# Patient Record
Sex: Male | Born: 1950 | Race: White | Hispanic: No | Marital: Married | Smoking: Current every day smoker
Health system: Southern US, Community
[De-identification: ages and names within clinical notes are randomized; demographics above are authoritative.]

## PROBLEM LIST (undated history)

## (undated) DIAGNOSIS — I639 Cerebral infarction, unspecified: Secondary | ICD-10-CM

## (undated) DIAGNOSIS — I499 Cardiac arrhythmia, unspecified: Secondary | ICD-10-CM

## (undated) DIAGNOSIS — N189 Chronic kidney disease, unspecified: Secondary | ICD-10-CM

## (undated) DIAGNOSIS — M1990 Unspecified osteoarthritis, unspecified site: Secondary | ICD-10-CM

## (undated) DIAGNOSIS — J189 Pneumonia, unspecified organism: Secondary | ICD-10-CM

## (undated) DIAGNOSIS — I2510 Atherosclerotic heart disease of native coronary artery without angina pectoris: Secondary | ICD-10-CM

## (undated) DIAGNOSIS — D649 Anemia, unspecified: Secondary | ICD-10-CM

## (undated) DIAGNOSIS — Z5189 Encounter for other specified aftercare: Secondary | ICD-10-CM

## (undated) DIAGNOSIS — K2961 Other gastritis with bleeding: Secondary | ICD-10-CM

## (undated) DIAGNOSIS — K922 Gastrointestinal hemorrhage, unspecified: Secondary | ICD-10-CM

## (undated) DIAGNOSIS — K921 Melena: Secondary | ICD-10-CM

## (undated) DIAGNOSIS — I482 Chronic atrial fibrillation, unspecified: Secondary | ICD-10-CM

## (undated) DIAGNOSIS — K5780 Diverticulitis of intestine, part unspecified, with perforation and abscess without bleeding: Secondary | ICD-10-CM

## (undated) HISTORY — DX: Anemia, unspecified: D64.9

## (undated) HISTORY — DX: Melena: K92.1

## (undated) HISTORY — DX: Encounter for other specified aftercare: Z51.89

## (undated) HISTORY — PX: COLON SURGERY: SHX602

## (undated) HISTORY — PX: COLONOSCOPY: SHX174

## (undated) HISTORY — DX: Other gastritis with bleeding: K29.61

## (undated) HISTORY — DX: Gastrointestinal hemorrhage, unspecified: K92.2

## (undated) HISTORY — DX: Cerebral infarction, unspecified: I63.9

## (undated) HISTORY — PX: HERNIA REPAIR: SHX51

## (undated) HISTORY — PX: TONSILLECTOMY: SUR1361

---

## 2004-10-09 ENCOUNTER — Ambulatory Visit: Payer: Self-pay | Admitting: Cardiovascular Disease

## 2004-11-04 ENCOUNTER — Ambulatory Visit: Payer: Self-pay | Admitting: Internal Medicine

## 2004-11-17 ENCOUNTER — Ambulatory Visit: Payer: Self-pay | Admitting: Internal Medicine

## 2011-09-07 HISTORY — PX: COLECTOMY WITH COLOSTOMY CREATION/HARTMANN PROCEDURE: SHX6598

## 2012-06-06 HISTORY — PX: CORONARY ANGIOPLASTY WITH STENT PLACEMENT: SHX49

## 2012-09-02 ENCOUNTER — Inpatient Hospital Stay: Admit: 2012-09-02 | Payer: Self-pay | Admitting: Surgery

## 2012-09-02 ENCOUNTER — Inpatient Hospital Stay (HOSPITAL_COMMUNITY)
Admission: EM | Admit: 2012-09-02 | Discharge: 2012-09-19 | DRG: 329 | Disposition: A | Discharge status: Skilled Nursing Facility | Payer: Managed Care, Other (non HMO) | Attending: General Surgery | Admitting: General Surgery

## 2012-09-02 ENCOUNTER — Encounter (HOSPITAL_COMMUNITY): Admission: EM | Disposition: A | Discharge status: Skilled Nursing Facility | Payer: Self-pay | Source: Home / Self Care

## 2012-09-02 ENCOUNTER — Encounter (HOSPITAL_COMMUNITY): Payer: Self-pay | Admitting: *Deleted

## 2012-09-02 ENCOUNTER — Encounter (HOSPITAL_COMMUNITY): Payer: Self-pay | Admitting: Anesthesiology

## 2012-09-02 ENCOUNTER — Emergency Department (HOSPITAL_COMMUNITY): Payer: Managed Care, Other (non HMO) | Admitting: Anesthesiology

## 2012-09-02 ENCOUNTER — Other Ambulatory Visit (INDEPENDENT_AMBULATORY_CARE_PROVIDER_SITE_OTHER): Payer: Self-pay | Admitting: General Surgery

## 2012-09-02 DIAGNOSIS — J95821 Acute postprocedural respiratory failure: Secondary | ICD-10-CM | POA: Diagnosis present

## 2012-09-02 DIAGNOSIS — F10931 Alcohol use, unspecified with withdrawal delirium: Secondary | ICD-10-CM | POA: Diagnosis not present

## 2012-09-02 DIAGNOSIS — Y95 Nosocomial condition: Secondary | ICD-10-CM

## 2012-09-02 DIAGNOSIS — R5381 Other malaise: Secondary | ICD-10-CM | POA: Diagnosis not present

## 2012-09-02 DIAGNOSIS — K631 Perforation of intestine (nontraumatic): Secondary | ICD-10-CM | POA: Diagnosis present

## 2012-09-02 DIAGNOSIS — R339 Retention of urine, unspecified: Secondary | ICD-10-CM | POA: Diagnosis not present

## 2012-09-02 DIAGNOSIS — E876 Hypokalemia: Secondary | ICD-10-CM | POA: Diagnosis present

## 2012-09-02 DIAGNOSIS — J811 Chronic pulmonary edema: Secondary | ICD-10-CM | POA: Diagnosis not present

## 2012-09-02 DIAGNOSIS — K5732 Diverticulitis of large intestine without perforation or abscess without bleeding: Principal | ICD-10-CM | POA: Diagnosis present

## 2012-09-02 DIAGNOSIS — I4891 Unspecified atrial fibrillation: Secondary | ICD-10-CM

## 2012-09-02 DIAGNOSIS — D696 Thrombocytopenia, unspecified: Secondary | ICD-10-CM | POA: Diagnosis not present

## 2012-09-02 DIAGNOSIS — Z9861 Coronary angioplasty status: Secondary | ICD-10-CM

## 2012-09-02 DIAGNOSIS — R601 Generalized edema: Secondary | ICD-10-CM | POA: Diagnosis not present

## 2012-09-02 DIAGNOSIS — R509 Fever, unspecified: Secondary | ICD-10-CM | POA: Diagnosis not present

## 2012-09-02 DIAGNOSIS — Z7902 Long term (current) use of antithrombotics/antiplatelets: Secondary | ICD-10-CM

## 2012-09-02 DIAGNOSIS — I059 Rheumatic mitral valve disease, unspecified: Secondary | ICD-10-CM | POA: Diagnosis present

## 2012-09-02 DIAGNOSIS — R109 Unspecified abdominal pain: Secondary | ICD-10-CM

## 2012-09-02 DIAGNOSIS — R609 Edema, unspecified: Secondary | ICD-10-CM | POA: Diagnosis not present

## 2012-09-02 DIAGNOSIS — K55059 Acute (reversible) ischemia of intestine, part and extent unspecified: Secondary | ICD-10-CM

## 2012-09-02 DIAGNOSIS — F1020 Alcohol dependence, uncomplicated: Secondary | ICD-10-CM | POA: Diagnosis present

## 2012-09-02 DIAGNOSIS — F10231 Alcohol dependence with withdrawal delirium: Secondary | ICD-10-CM | POA: Diagnosis not present

## 2012-09-02 DIAGNOSIS — J449 Chronic obstructive pulmonary disease, unspecified: Secondary | ICD-10-CM | POA: Diagnosis present

## 2012-09-02 DIAGNOSIS — K551 Chronic vascular disorders of intestine: Secondary | ICD-10-CM

## 2012-09-02 DIAGNOSIS — N179 Acute kidney failure, unspecified: Secondary | ICD-10-CM | POA: Diagnosis not present

## 2012-09-02 DIAGNOSIS — J189 Pneumonia, unspecified organism: Secondary | ICD-10-CM

## 2012-09-02 DIAGNOSIS — F17200 Nicotine dependence, unspecified, uncomplicated: Secondary | ICD-10-CM | POA: Diagnosis present

## 2012-09-02 DIAGNOSIS — I4729 Other ventricular tachycardia: Secondary | ICD-10-CM | POA: Diagnosis not present

## 2012-09-02 DIAGNOSIS — J4489 Other specified chronic obstructive pulmonary disease: Secondary | ICD-10-CM | POA: Diagnosis present

## 2012-09-02 DIAGNOSIS — I498 Other specified cardiac arrhythmias: Secondary | ICD-10-CM | POA: Diagnosis not present

## 2012-09-02 DIAGNOSIS — I2510 Atherosclerotic heart disease of native coronary artery without angina pectoris: Secondary | ICD-10-CM | POA: Diagnosis present

## 2012-09-02 DIAGNOSIS — I472 Ventricular tachycardia: Secondary | ICD-10-CM

## 2012-09-02 DIAGNOSIS — Z7982 Long term (current) use of aspirin: Secondary | ICD-10-CM

## 2012-09-02 DIAGNOSIS — J9600 Acute respiratory failure, unspecified whether with hypoxia or hypercapnia: Secondary | ICD-10-CM

## 2012-09-02 DIAGNOSIS — J810 Acute pulmonary edema: Secondary | ICD-10-CM | POA: Diagnosis not present

## 2012-09-02 DIAGNOSIS — Z79899 Other long term (current) drug therapy: Secondary | ICD-10-CM

## 2012-09-02 DIAGNOSIS — I482 Chronic atrial fibrillation, unspecified: Secondary | ICD-10-CM | POA: Diagnosis present

## 2012-09-02 DIAGNOSIS — R188 Other ascites: Secondary | ICD-10-CM | POA: Diagnosis present

## 2012-09-02 HISTORY — DX: Atherosclerotic heart disease of native coronary artery without angina pectoris: I25.10

## 2012-09-02 HISTORY — PX: LAPAROTOMY: SHX154

## 2012-09-02 HISTORY — DX: Diverticulitis of large intestine without perforation or abscess without bleeding: K57.32

## 2012-09-02 HISTORY — DX: Diverticulitis of intestine, part unspecified, with perforation and abscess without bleeding: K57.80

## 2012-09-02 HISTORY — PX: COLOSTOMY REVISION: SHX5232

## 2012-09-02 LAB — POCT I-STAT 7, (LYTES, BLD GAS, ICA,H+H)
Acid-base deficit: 1 mmol/L (ref 0.0–2.0)
O2 Saturation: 100 %
Patient temperature: 34.9
TCO2: 26 mmol/L (ref 0–100)
pCO2 arterial: 42.4 mmHg (ref 35.0–45.0)
pO2, Arterial: 340 mmHg — ABNORMAL HIGH (ref 80.0–100.0)

## 2012-09-02 LAB — PREPARE RBC (CROSSMATCH)

## 2012-09-02 SURGERY — LAPAROTOMY, EXPLORATORY
Anesthesia: General | Site: Abdomen | Wound class: Clean Contaminated

## 2012-09-02 MED ORDER — FENTANYL CITRATE 0.05 MG/ML IJ SOLN
INTRAMUSCULAR | Status: DC | PRN
  Administered 2012-09-02 (×3): 50 ug via INTRAVENOUS

## 2012-09-02 MED ORDER — SUCCINYLCHOLINE CHLORIDE 20 MG/ML IJ SOLN
INTRAMUSCULAR | Status: DC | PRN
  Administered 2012-09-02: 120 mg via INTRAVENOUS

## 2012-09-02 MED ORDER — SODIUM CHLORIDE 0.9 % IR SOLN
Status: DC | PRN
  Administered 2012-09-02: 3000 mL

## 2012-09-02 MED ORDER — ROCURONIUM BROMIDE 100 MG/10ML IV SOLN
INTRAVENOUS | Status: DC | PRN
  Administered 2012-09-02: 20 mg via INTRAVENOUS
  Administered 2012-09-02: 30 mg via INTRAVENOUS

## 2012-09-02 MED ORDER — MIDAZOLAM HCL 5 MG/5ML IJ SOLN
INTRAMUSCULAR | Status: DC | PRN
  Administered 2012-09-02 (×2): 1 mg via INTRAVENOUS

## 2012-09-02 MED ORDER — GLYCOPYRROLATE 0.2 MG/ML IJ SOLN
INTRAMUSCULAR | Status: DC | PRN
  Administered 2012-09-02: 0.4 mg via INTRAVENOUS

## 2012-09-02 MED ORDER — PROPOFOL 10 MG/ML IV BOLUS
INTRAVENOUS | Status: DC | PRN
  Administered 2012-09-02: 180 mg via INTRAVENOUS

## 2012-09-02 MED ORDER — ARTIFICIAL TEARS OP OINT
TOPICAL_OINTMENT | OPHTHALMIC | Status: DC | PRN
  Administered 2012-09-02: 1 via OPHTHALMIC

## 2012-09-02 MED ORDER — NEOSTIGMINE METHYLSULFATE 1 MG/ML IJ SOLN
INTRAMUSCULAR | Status: DC | PRN
  Administered 2012-09-02: 3 mg via INTRAVENOUS

## 2012-09-02 MED ORDER — CEFAZOLIN SODIUM 1-5 GM-% IV SOLN: 1.0000 g | INTRAVENOUS | Status: DC

## 2012-09-02 MED ORDER — ALBUTEROL SULFATE HFA 108 (90 BASE) MCG/ACT IN AERS
INHALATION_SPRAY | RESPIRATORY_TRACT | Status: DC | PRN
  Administered 2012-09-02 (×2): 2 via RESPIRATORY_TRACT

## 2012-09-02 MED ORDER — PHENYLEPHRINE HCL 10 MG/ML IJ SOLN
INTRAMUSCULAR | Status: DC | PRN
  Administered 2012-09-02: 40 ug via INTRAVENOUS
  Administered 2012-09-02: 80 ug via INTRAVENOUS
  Administered 2012-09-02: 40 ug via INTRAVENOUS
  Administered 2012-09-02 (×2): 80 ug via INTRAVENOUS

## 2012-09-02 MED ORDER — LACTATED RINGERS IV SOLN
INTRAVENOUS | Status: DC | PRN
  Administered 2012-09-02: 22:00:00 via INTRAVENOUS

## 2012-09-02 MED ORDER — SODIUM BICARBONATE 8.4 % IV SOLN
INTRAVENOUS | Status: DC | PRN
  Administered 2012-09-02: 50 meq via INTRAVENOUS

## 2012-09-02 MED ORDER — CEFAZOLIN SODIUM-DEXTROSE 2-3 GM-% IV SOLR
2.0000 g | INTRAVENOUS | Status: AC
  Administered 2012-09-02: 1 g via INTRAVENOUS

## 2012-09-02 MED ORDER — LIDOCAINE HCL (CARDIAC) 20 MG/ML IV SOLN
INTRAVENOUS | Status: DC | PRN
  Administered 2012-09-02: 100 mg via INTRAVENOUS

## 2012-09-02 MED ORDER — SODIUM CHLORIDE 0.9 % IR SOLN
Status: DC | PRN
  Administered 2012-09-02: 20:00:00

## 2012-09-02 SURGICAL SUPPLY — 69 items
ADH SKN CLS APL DERMABOND .7 (GAUZE/BANDAGES/DRESSINGS) ×4
ATTRACTOMAT 16X20 MAGNETIC DRP (DRAPES) ×2 IMPLANT
BRR ADH 5X3 SEPRAFILM 6 SHT (MISCELLANEOUS) ×2
CANISTER SUCTION 2500CC (MISCELLANEOUS) ×3 IMPLANT
CLIP TI LARGE 6 (CLIP) ×2 IMPLANT
CLIP TI MEDIUM 24 (CLIP) ×3 IMPLANT
CLIP TI MEDIUM 6 (CLIP) ×2 IMPLANT
CLIP TI WIDE RED SMALL 24 (CLIP) ×3 IMPLANT
CLIP TI WIDE RED SMALL 6 (CLIP) ×2 IMPLANT
CLOTH BEACON ORANGE TIMEOUT ST (SAFETY) ×3 IMPLANT
COVER MAYO STAND STRL (DRAPES) ×3 IMPLANT
COVER SURGICAL LIGHT HANDLE (MISCELLANEOUS) ×3 IMPLANT
DERMABOND ADVANCED (GAUZE/BANDAGES/DRESSINGS) ×2
DERMABOND ADVANCED .7 DNX12 (GAUZE/BANDAGES/DRESSINGS) ×4 IMPLANT
DRAPE WARM FLUID 44X44 (DRAPE) ×3 IMPLANT
DRSG COVADERM 4X8 (GAUZE/BANDAGES/DRESSINGS) ×2 IMPLANT
ELECT BLADE 4.0 EZ CLEAN MEGAD (MISCELLANEOUS)
ELECT BLADE 6.5 EXT (BLADE) IMPLANT
ELECT REM PT RETURN 9FT ADLT (ELECTROSURGICAL) ×3
ELECTRODE BLDE 4.0 EZ CLN MEGD (MISCELLANEOUS) ×1 IMPLANT
ELECTRODE REM PT RTRN 9FT ADLT (ELECTROSURGICAL) ×2 IMPLANT
GEL ULTRASOUND 20GR AQUASONIC (MISCELLANEOUS) IMPLANT
GLOVE BIOGEL PI IND STRL 6 (GLOVE) ×5 IMPLANT
GLOVE BIOGEL PI IND STRL 7.5 (GLOVE) ×2 IMPLANT
GLOVE BIOGEL PI INDICATOR 6 (GLOVE) ×5
GLOVE BIOGEL PI INDICATOR 7.5 (GLOVE) ×1
GLOVE SURG SS PI 7.5 STRL IVOR (GLOVE) ×3 IMPLANT
GOWN PREVENTION PLUS XXLARGE (GOWN DISPOSABLE) ×3 IMPLANT
GOWN STRL NON-REIN LRG LVL3 (GOWN DISPOSABLE) ×12 IMPLANT
HEMOSTAT SNOW SURGICEL 2X4 (HEMOSTASIS) IMPLANT
HEMOSTAT SURGICEL 2X14 (HEMOSTASIS) IMPLANT
INSERT FOGARTY 61MM (MISCELLANEOUS) ×3 IMPLANT
INSERT FOGARTY SM (MISCELLANEOUS) ×2 IMPLANT
KIT BASIN OR (CUSTOM PROCEDURE TRAY) ×3 IMPLANT
KIT COLOSTOMY ILEOSTOMY 2.75 (WOUND CARE) ×2 IMPLANT
KIT ROOM TURNOVER OR (KITS) ×3 IMPLANT
LIGASURE IMPACT 36 18CM CVD LR (INSTRUMENTS) ×2 IMPLANT
NS IRRIG 1000ML POUR BTL (IV SOLUTION) ×10 IMPLANT
PACK AORTA (CUSTOM PROCEDURE TRAY) ×3 IMPLANT
PAD ARMBOARD 7.5X6 YLW CONV (MISCELLANEOUS) ×6 IMPLANT
RELOAD PROXIMATE 75MM BLUE (ENDOMECHANICALS) ×6 IMPLANT
RELOAD STAPLE 75 3.8 BLU REG (ENDOMECHANICALS) IMPLANT
SEPRAFILM PROCEDURAL PACK 3X5 (MISCELLANEOUS) ×2 IMPLANT
SPECIMEN JAR MEDIUM (MISCELLANEOUS) ×3 IMPLANT
SPONGE GAUZE 4X4 12PLY (GAUZE/BANDAGES/DRESSINGS) ×2 IMPLANT
STAPLER PROXIMATE 75MM BLUE (STAPLE) ×2 IMPLANT
STAPLER VISISTAT 35W (STAPLE) ×2 IMPLANT
SUT CHROMIC 2 0 SH (SUTURE) ×4 IMPLANT
SUT ETHIBOND 5 LR DA (SUTURE) IMPLANT
SUT PDS AB 1 TP1 54 (SUTURE) ×2 IMPLANT
SUT PDS AB 1 TP1 96 (SUTURE) ×4 IMPLANT
SUT PROLENE 2 0 SH 30 (SUTURE) ×4 IMPLANT
SUT PROLENE 3 0 SH 48 (SUTURE) ×3 IMPLANT
SUT PROLENE 5 0 C 1 24 (SUTURE) IMPLANT
SUT PROLENE 5 0 C 1 36 (SUTURE) IMPLANT
SUT SILK 2 0SH CR/8 30 (SUTURE) ×1 IMPLANT
SUT VIC AB 2-0 CT1 18 (SUTURE) ×2 IMPLANT
SUT VIC AB 2-0 CT1 27 (SUTURE) ×3
SUT VIC AB 2-0 CT1 36 (SUTURE) ×2 IMPLANT
SUT VIC AB 2-0 CT1 TAPERPNT 27 (SUTURE) ×1 IMPLANT
SUT VIC AB 2-0 SH 18 (SUTURE) ×2 IMPLANT
SUT VIC AB 3-0 SH 27 (SUTURE) ×12
SUT VIC AB 3-0 SH 27X BRD (SUTURE) ×8 IMPLANT
SUT VICRYL 4-0 PS2 18IN ABS (SUTURE) ×2 IMPLANT
TOWEL BLUE STERILE X RAY DET (MISCELLANEOUS) ×6 IMPLANT
TOWEL OR 17X24 6PK STRL BLUE (TOWEL DISPOSABLE) ×6 IMPLANT
TOWEL OR 17X26 10 PK STRL BLUE (TOWEL DISPOSABLE) ×8 IMPLANT
TRAY FOLEY CATH 14FRSI W/METER (CATHETERS) ×3 IMPLANT
WATER STERILE IRR 1000ML POUR (IV SOLUTION) ×6 IMPLANT

## 2012-09-02 NOTE — H&P (Addendum)
[facility] [place]      History and Physical  Patient name: Johnathan Bauer MRN: [id number] DOB: 07-May-1951 Sex: male   Reason for Admission:  Chief Complaint  Patient presents with  . Mesenteric Insufficiency    HISTORY OF PRESENT ILLNESS: This is a very pleasant 61 year old gentleman who was trasnferred from [facility] for abdominal pain.  He states that the pain began yesterday morning.  It was unbearable.  He had emesis x3  as well as non-bloody diarrhea.  There were no relieving or aggravating factors.  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  His lactate was 7.8.  He was given a bolus of heparin and transferred to [facility].  His pain has improved with narcotics.  He had rated it 10/10.  He has a history of AFIB.  He could not tolerate coumadin.  He had a coronary stent placed in March for SOB.  He is on Effient and ASA for his stent.  He is a current smoker, although he has cut back since his stent.   PMH: AFIB CAD, s/p stent Tob abuse  PSH:  None  Social History:  Current smoker  FAMILY HISTORY:  Father died of melanoma and lymphoma.  Grandfather died of prostate cancer at age 24  Allergies:  NKDA  MEDS: Effient 5mg  daily ASA 81mg  qday Digoxin 0.5 mg PO daily Lopresor 50 mg PO BID   REVIEW OF SYSTEMS: Cardiovascular: No chest pain, chest pressure, palpitations, orthopnea, or dyspnea on exertion. No claudication or rest pain,  No history of DVT or phlebitis. Pulmonary: No productive cough, asthma or wheezing. Neurologic: No weakness, paresthesias, aphasia, or amaurosis. No dizziness. Hematologic: No bleeding problems or clotting disorders.  On Effient for cardiac stent Musculoskeletal: No joint pain or joint swelling. Gastrointestinal: No blood in stool.  Did have diarrhea and emesis x3 yesterday.  Significant abd pain  Genitourinary: No dysuria or hematuria. Psychiatric:: No history of major  depression. Integumentary: No rashes or ulcers. Constitutional: No fever or chills.  PHYSICAL EXAMINATION: General: The patient appears their stated age.  Vital signs are BP 119/86  Pulse 101  Temp 97.8 F (36.6 C) (Oral)  Resp 18  SpO2 98% HEENT:  No gross abnormalities Pulmonary: Respirations are non-labored Abdomen: Tender in lower abd.  No rebound Musculoskeletal: There are no major deformities.   Neurologic: No focal weakness or paresthesias are detected, Skin: There are no ulcer or rashes noted. Psychiatric: The patient has normal affect. Cardiovascular: There is a irregular rhythm.  Pedal pulses are not palpable.  Brisk femoral pulses.  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  The ascites in his abdomen is concerning for ischemic bowel.  Despite being on effient, the patient needs urgent abdominal exploration.  I discussed the significance of the CT scan findings as well as the details of the operation with the patient and wife.  They understand the gravity of the situation.  I told them of the many possibilities that may occur during the operation and that we could not be sure exactly what will occur until we exactly define the problem.  They understand that this is a life threatening  problem.  I am having platelets set up to try and offset some of the effects of the Effient.       Jorge Ny, M.D. [facility] [place] Office: [phone] Pager:  [phone]

## 2012-09-02 NOTE — Anesthesia Preprocedure Evaluation (Addendum)
Anesthesia Evaluation  Patient identified by MRN, date of birth, ID band Patient awake    Reviewed: Allergy & Precautions, H&P , NPO status , Patient's Chart, lab work & pertinent test results  Airway       Dental   Pulmonary former smoker,          Cardiovascular hypertension, + dysrhythmias Atrial Fibrillation     Neuro/Psych    GI/Hepatic Suspected ischemic bowel   Endo/Other    Renal/GU      Musculoskeletal   Abdominal   Peds  Hematology   Anesthesia Other Findings   Reproductive/Obstetrics                           Anesthesia Physical Anesthesia Plan  ASA: IV and emergent  Anesthesia Plan: General   Post-op Pain Management:    Induction: Intravenous, Rapid sequence and Cricoid pressure planned  Airway Management Planned: Oral ETT  Additional Equipment: Arterial line  Intra-op Plan:   Post-operative Plan: Post-operative intubation/ventilation  Informed Consent: I have reviewed the patients History and Physical, chart, labs and discussed the procedure including the risks, benefits and alternatives for the proposed anesthesia with the patient or authorized representative who has indicated his/her understanding and acceptance.     Plan Discussed with: Surgeon and CRNA  Anesthesia Plan Comments:         Anesthesia Quick Evaluation

## 2012-09-02 NOTE — ED Notes (Signed)
Dr Brabham at bedside. 

## 2012-09-02 NOTE — Consult Note (Signed)
Reason for Consult:SMA embolus Referring Physician: Dr. Blanche East Johnathan Bauer is an 61 y.o. male.  HPI: Pt with a ~ 18hr h/o sever abd pain.  Pt was seen and eval'd at an OSH and dx'd with SMA embolus on CT.  Pt was transferred for further care.  Pt con't with pain in the RLQ.  He does have a h/o Afib and on anti-plt tx.  No past medical history on file.  No past surgical history on file.  No family history on file.  Social History:  does not have a smoking history on file. He does not have any smokeless tobacco history on file. His alcohol and drug histories not on file.  Allergies: Allergies not on file  Medications: I have reviewed the patient's current medications.  No results found for this or any previous visit (from the past 48 hour(s)).  No results found.  Review of Systems  Constitutional: Negative.   Respiratory: Negative.   Cardiovascular: Negative.   Gastrointestinal: Positive for abdominal pain.   Blood pressure 119/86, pulse 101, temperature 97.8 F (36.6 C), temperature source Oral, resp. rate 18, SpO2 98.00%. Physical Exam  Constitutional: He appears well-developed and well-nourished.  HENT:  Head: Normocephalic and atraumatic.  Eyes: Conjunctivae normal and EOM are normal. Pupils are equal, round, and reactive to light.  Neck: Normal range of motion.  Respiratory: Effort normal and breath sounds normal.  GI: Soft. He exhibits no distension. There is tenderness (rlq). There is no rebound and no guarding.    Assessment/Plan: 61 y/o M with SMA embolus  I d/w the pt and his wife, the need for ex lap and eval of uncomprimised bowel.  I discussed the possibility to have to resect and large amount of bowel, and also the possibility if too much bowel compromised that we would not be able to attempt resection.  We also discussed the likely need to have to leave his abd open for a "2nd look operation" in 24-48 hrs.  They both understood and are willing to proceed  with any necessary tx.    Johnathan Bauer., Johnathan Bauer 09/02/2012, 8:56 PM

## 2012-09-02 NOTE — Anesthesia Procedure Notes (Signed)
Procedure Name: Intubation Date/Time: 09/02/2012 9:54 PM Performed by: Gayla Medicus Pre-anesthesia Checklist: Patient identified, Timeout performed, Emergency Drugs available, Suction available and Patient being monitored Patient Re-evaluated:Patient Re-evaluated prior to inductionOxygen Delivery Method: Circle system utilized Preoxygenation: Pre-oxygenation with 100% oxygen Intubation Type: IV induction, Cricoid Pressure applied and Rapid sequence Laryngoscope Size: Mac and 3 Grade View: Grade II Tube type: Oral Tube size: 7.5 mm Number of attempts: 1 Airway Equipment and Method: Stylet Placement Confirmation: ETT inserted through vocal cords under direct vision,  positive ETCO2 and breath sounds checked- equal and bilateral Secured at: 23 cm Tube secured with: Tape Dental Injury: Teeth and Oropharynx as per pre-operative assessment

## 2012-09-02 NOTE — ED Notes (Signed)
Pt Administered Zofran, and Dilaudid prior to leaving [facility]

## 2012-09-02 NOTE — Preoperative (Signed)
Beta Blockers   Reason not to administer Beta Blockers:Not Applicable 

## 2012-09-02 NOTE — ED Notes (Signed)
Pt arrived from [facility] via [facility] c/o Superior Mesenteric Artery Thrombosis, for surgery. Accepting physician Myra Gianotti MD

## 2012-09-03 ENCOUNTER — Encounter (HOSPITAL_COMMUNITY): Payer: Self-pay | Admitting: Cardiology

## 2012-09-03 DIAGNOSIS — K5732 Diverticulitis of large intestine without perforation or abscess without bleeding: Secondary | ICD-10-CM

## 2012-09-03 DIAGNOSIS — I2510 Atherosclerotic heart disease of native coronary artery without angina pectoris: Secondary | ICD-10-CM | POA: Diagnosis present

## 2012-09-03 DIAGNOSIS — K631 Perforation of intestine (nontraumatic): Secondary | ICD-10-CM

## 2012-09-03 DIAGNOSIS — I482 Chronic atrial fibrillation, unspecified: Secondary | ICD-10-CM | POA: Diagnosis present

## 2012-09-03 HISTORY — DX: Perforation of intestine (nontraumatic): K63.1

## 2012-09-03 HISTORY — DX: Atherosclerotic heart disease of native coronary artery without angina pectoris: I25.10

## 2012-09-03 LAB — BASIC METABOLIC PANEL
CO2: 22 mEq/L (ref 19–32)
Calcium: 6.9 mg/dL — ABNORMAL LOW (ref 8.4–10.5)
GFR calc Af Amer: >90 mL/min (ref >90)
Sodium: 134 mEq/L — ABNORMAL LOW (ref 135–145)

## 2012-09-03 LAB — CBC
MCV: 104.1 fL — ABNORMAL HIGH (ref 78.0–100.0)
Platelets: 141 10*3/uL — ABNORMAL LOW (ref 150–400)
RBC: 3.68 MIL/uL — ABNORMAL LOW (ref 4.22–5.81)
RDW: 13.2 % (ref 11.5–15.5)
WBC: 15.2 10*3/uL — ABNORMAL HIGH (ref 4.0–10.5)

## 2012-09-03 LAB — PREPARE PLATELET PHERESIS: Unit division: 0

## 2012-09-03 LAB — APTT: aPTT: 36 seconds (ref 24–37)

## 2012-09-03 LAB — PHOSPHORUS: Phosphorus: 3.1 mg/dL (ref 2.3–4.6)

## 2012-09-03 LAB — PROTIME-INR
INR: 1.64 — ABNORMAL HIGH (ref 0.00–1.49)
Prothrombin Time: 18.9 seconds — ABNORMAL HIGH (ref 11.6–15.2)

## 2012-09-03 LAB — LACTIC ACID, PLASMA: Lactic Acid, Venous: 3.6 mmol/L — ABNORMAL HIGH (ref 0.5–2.2)

## 2012-09-03 LAB — MAGNESIUM: Magnesium: 0.6 mg/dL — CL (ref 1.5–2.5)

## 2012-09-03 MED ORDER — CIPROFLOXACIN IN D5W 400 MG/200ML IV SOLN
400.0000 mg | Freq: Two times a day (BID) | INTRAVENOUS | Status: DC
  Administered 2012-09-03 – 2012-09-06 (×7): 400 mg via INTRAVENOUS
  Filled 2012-09-03 (×11): qty 200

## 2012-09-03 MED ORDER — METRONIDAZOLE IN NACL 5-0.79 MG/ML-% IV SOLN
500.0000 mg | Freq: Three times a day (TID) | INTRAVENOUS | Status: DC
  Administered 2012-09-03 – 2012-09-06 (×11): 500 mg via INTRAVENOUS
  Filled 2012-09-03 (×13): qty 100

## 2012-09-03 MED ORDER — HYDROMORPHONE HCL PF 1 MG/ML IJ SOLN
INTRAMUSCULAR | Status: AC
  Filled 2012-09-03: qty 1

## 2012-09-03 MED ORDER — ONDANSETRON HCL 4 MG/2ML IJ SOLN: 4.0000 mg | Freq: Four times a day (QID) | INTRAMUSCULAR | Status: DC | PRN

## 2012-09-03 MED ORDER — DILTIAZEM HCL 25 MG/5ML IV SOLN
10.0000 mg | Freq: Once | INTRAVENOUS | Status: AC
  Administered 2012-09-03: 10 mg via INTRAVENOUS
  Filled 2012-09-03: qty 5

## 2012-09-03 MED ORDER — DIPHENHYDRAMINE HCL 12.5 MG/5ML PO ELIX
12.5000 mg | ORAL_SOLUTION | Freq: Four times a day (QID) | ORAL | Status: DC | PRN
  Filled 2012-09-03: qty 5

## 2012-09-03 MED ORDER — DIPHENHYDRAMINE HCL 50 MG/ML IJ SOLN: 12.5000 mg | Freq: Four times a day (QID) | INTRAMUSCULAR | Status: DC | PRN

## 2012-09-03 MED ORDER — HYDROMORPHONE 0.3 MG/ML IV SOLN
INTRAVENOUS | Status: DC
  Administered 2012-09-03 (×2): via INTRAVENOUS
  Administered 2012-09-03: 1.8 mg via INTRAVENOUS
  Administered 2012-09-03: 4.08 mg via INTRAVENOUS
  Administered 2012-09-03 (×2): 1.8 mg via INTRAVENOUS
  Administered 2012-09-04 (×2): 1.2 mg via INTRAVENOUS
  Administered 2012-09-04 (×2): 0.9 mg via INTRAVENOUS
  Administered 2012-09-04: 1.2 mg via INTRAVENOUS
  Administered 2012-09-04: 1.14 mg via INTRAVENOUS
  Administered 2012-09-04: 09:00:00 via INTRAVENOUS
  Administered 2012-09-05: 0.9 mg via INTRAVENOUS
  Administered 2012-09-05 (×2): 1.5 mg via INTRAVENOUS
  Filled 2012-09-03 (×3): qty 25

## 2012-09-03 MED ORDER — OXYCODONE HCL 5 MG/5ML PO SOLN: 5.0000 mg | Freq: Once | ORAL | Status: DC | PRN

## 2012-09-03 MED ORDER — HYDROMORPHONE 0.3 MG/ML IV SOLN
INTRAVENOUS | Status: AC
  Administered 2012-09-03: 0.6 mg
  Filled 2012-09-03: qty 25

## 2012-09-03 MED ORDER — METRONIDAZOLE IN NACL 5-0.79 MG/ML-% IV SOLN: 500.0000 mg | Freq: Three times a day (TID) | INTRAVENOUS | Status: DC

## 2012-09-03 MED ORDER — MAGNESIUM SULFATE 40 MG/ML IJ SOLN
4.0000 g | Freq: Once | INTRAMUSCULAR | Status: AC
  Administered 2012-09-03: 4 g via INTRAVENOUS
  Filled 2012-09-03: qty 100

## 2012-09-03 MED ORDER — PANTOPRAZOLE SODIUM 40 MG IV SOLR
40.0000 mg | Freq: Every day | INTRAVENOUS | Status: DC
  Administered 2012-09-03 – 2012-09-09 (×7): 40 mg via INTRAVENOUS
  Filled 2012-09-03 (×11): qty 40

## 2012-09-03 MED ORDER — HYDROMORPHONE HCL PF 1 MG/ML IJ SOLN
0.2500 mg | INTRAMUSCULAR | Status: DC | PRN
  Administered 2012-09-03 (×2): 0.5 mg via INTRAVENOUS

## 2012-09-03 MED ORDER — SODIUM CHLORIDE 0.9 % IV SOLN: 1.0000 g | Freq: Once | INTRAVENOUS | Status: DC

## 2012-09-03 MED ORDER — OXYCODONE HCL 5 MG PO TABS: 5.0000 mg | ORAL_TABLET | Freq: Once | ORAL | Status: DC | PRN

## 2012-09-03 MED ORDER — CIPROFLOXACIN IN D5W 400 MG/200ML IV SOLN: 400.0000 mg | Freq: Two times a day (BID) | INTRAVENOUS | Status: DC

## 2012-09-03 MED ORDER — NALOXONE HCL 0.4 MG/ML IJ SOLN: 0.4000 mg | INTRAMUSCULAR | Status: DC | PRN

## 2012-09-03 MED ORDER — SODIUM CHLORIDE 0.9 % IJ SOLN: 9.0000 mL | INTRAMUSCULAR | Status: DC | PRN

## 2012-09-03 MED ORDER — DILTIAZEM HCL 100 MG IV SOLR
5.0000 mg/h | INTRAVENOUS | Status: DC
  Administered 2012-09-03: 8 mg/h via INTRAVENOUS
  Administered 2012-09-03: 5 mg/h via INTRAVENOUS
  Administered 2012-09-04: 8 mg/h via INTRAVENOUS
  Administered 2012-09-05: 15 mg/h via INTRAVENOUS
  Filled 2012-09-03 (×4): qty 100

## 2012-09-03 MED ORDER — ONDANSETRON HCL 4 MG/2ML IJ SOLN
4.0000 mg | Freq: Four times a day (QID) | INTRAMUSCULAR | Status: DC | PRN
  Administered 2012-09-09 – 2012-09-10 (×2): 4 mg via INTRAVENOUS
  Filled 2012-09-03 (×2): qty 2

## 2012-09-03 MED ORDER — PROMETHAZINE HCL 25 MG/ML IJ SOLN: 6.2500 mg | INTRAMUSCULAR | Status: DC | PRN

## 2012-09-03 MED ORDER — DEXTROSE IN LACTATED RINGERS 5 % IV SOLN
INTRAVENOUS | Status: DC
  Administered 2012-09-03 – 2012-09-05 (×4): via INTRAVENOUS

## 2012-09-03 MED ORDER — DILTIAZEM LOAD VIA INFUSION
10.0000 mg | Freq: Once | INTRAVENOUS | Status: DC
  Filled 2012-09-03: qty 10

## 2012-09-03 NOTE — Progress Notes (Signed)
CRITICAL VALUE ALERT  Critical value received:  Magnesium 0.6  Date of notification:  12/29  Time of notification:  0830  Critical value read back: yes   Nurse who received alert: Demetria Pore  MD notified (1st page):  Dr. Rayburn Ma  Time of first page: [phone]  Orders received to have pharmacy give replacement dosage of magnesium.

## 2012-09-03 NOTE — Op Note (Signed)
[facility] of [place]  Patient name: Johnathan Bauer MRN: [id number] DOB: [birth year] Sex: male  09/02/2012 - 09/03/2012 Pre-operative Diagnosis: SMA embolus with compromised bowel Post-operative diagnosis:  Perforated diverticulitis Surgeon:  Jorge Ny Assistants:  A. Derrell Lolling Procedure:   1.) Exploratory Laparotomy  2.) resection of sigmoid colon  3.)  End colostomy Anesthesia:  General Blood Loss:  See anesthesia record Specimens:  Sigmoid colon  Findings:  Palpable pulse within proximal SMA.  Doppler signals in the entire small bowel with no evidence of mesenteric ischemia.  Perforated diverticulitis  Indications:  The patient presented in transfer from [facility] with 24 hours of abdominal pain.  The patient has a history of AFIB, but is not on coumadin.  He is on antiplatelet therapy for a cardiac stent.  He has a CT at [facility] that showed SMA mural thrombus.  There was concern for occlusion.  He also had extensive diverticulosis / diverticulitis.  He was febrile on arrival with abdominal pain.  Outside lactate was 7.  I felt this was potentially a life threatening situation as there was significant concern for mesenteric ischemia with dead bowel.  Therefore, he was consented for exploration  Procedure:  The patient was identified in the holding area and taken to [facility] OR ROOM 10  The patient was then placed supine on the table. general anesthesia was administered.  The patient was prepped and draped in the usual sterile fashion.  A time out was called and antibiotics were administered.  A midline abdominal incision was made.  This was carried down to the fascia with cautery.  The fascia was divided and the peritoneal cavity was opened sharply.  There was evidence of a perforated viscus.  We ran the entire length of the small bowel.  The were no areas of ischemia from the ligament of Treitz to the sigmoid colon.  There was extensive diverticular disease within the  sigmoid colon with an area pf perforation.  I was able to palpate the SMA proximally, and it had a good pulse.  Also, doppler was used to examine the small bowel.  There was a good doppler signal within the small bowel out to the distal mesentary.  At this point, I saw no evidence of mesenteric ischemia, and given the perforated diverticulum, I felt this was the source of his pain and exam findings.  I did not feel that under these conditions that further evaluation or intervention on his SMA was warented.  Dr. Antonieta Pert then took over and performed the rest of the procedure.  Please see his note for further details.  I assisted him for the duration of the proceedure   Disposition:  To PACU in stable condition.   Juleen China, M.D. [facility] [place] Office: [phone] Pager:  [phone]

## 2012-09-03 NOTE — Progress Notes (Signed)
1 Day Post-Op  Subjective: Pt doing well.  Cards eval and help appreciated.  abd soreness this AM.  Objective: Vital signs in last 24 hours: Temp:  [97.8 F (36.6 C)-98.3 F (36.8 C)] 97.8 F (36.6 C) (12/29 0213) Pulse Rate:  [57-136] 99  (12/29 0630) Resp:  [12-25] 18  (12/29 0630) BP: (104-154)/(72-109) 123/74 mmHg (12/29 0215) SpO2:  [91 %-100 %] 94 % (12/29 0630) Arterial Line BP: (97-154)/(56-89) 97/56 mmHg (12/29 0630) Weight:  [180 lb 12.4 oz (82 kg)] 180 lb 12.4 oz (82 kg) (12/29 0630)    Intake/Output from previous day: 12/28 0701 - 12/29 0700 In: 2602 [I.V.:2000; Blood:302; IV Piggyback:300] Out: 285 [Urine:285] Intake/Output this shift: Total I/O In: 2602 [I.V.:2000; Blood:302; IV Piggyback:300] Out: 285 [Urine:285]  General appearance: alert and cooperative Cardio: regular rate and rhythm, S1, S2 normal, no murmur, click, rub or gallop GI: approp tpp, ND, decreased BS, ostomy pink/patent  Lab Results:   Basename 09/02/12 2246  WBC --  HGB 15.3  HCT 45.0  PLT --   BMET  Basename 09/02/12 2246  NA 136  K 5.5*  CL --  CO2 --  GLUCOSE --  BUN --  CREATININE --  CALCIUM --   PT/INR No results found for this basename: LABPROT:2,INR:2 in the last 72 hours ABG  Basename 09/02/12 2246  PHART 7.368  HCO3 24.9*    Studies/Results: No results found.  Anti-infectives: Anti-infectives     Start     Dose/Rate Route Frequency Ordered Stop   09/04/12 2300   ertapenem (INVANZ) 1 g in sodium chloride 0.9 % 50 mL IVPB  Status:  Discontinued        1 g 100 mL/hr over 30 Minutes Intravenous  Once 09/03/12 0200 09/03/12 0240   09/03/12 0600   ceFAZolin (ANCEF) IVPB 1 g/50 mL premix  Status:  Discontinued     Comments: Send with pt to OR      1 g 100 mL/hr over 30 Minutes Intravenous On call 09/02/12 2140 09/02/12 2148   09/03/12 0600   ceFAZolin (ANCEF) IVPB 2 g/50 mL premix     Comments: Send with pt to OR      2 g 100 mL/hr over 30 Minutes  Intravenous On call 09/02/12 2148 09/02/12 2212   09/03/12 0400   ciprofloxacin (CIPRO) IVPB 400 mg        400 mg 200 mL/hr over 60 Minutes Intravenous 2 times daily 09/03/12 0241     09/03/12 0400   metroNIDAZOLE (FLAGYL) IVPB 500 mg        500 mg 100 mL/hr over 60 Minutes Intravenous 3 times per day 09/03/12 0241     09/03/12 0045   ciprofloxacin (CIPRO) IVPB 400 mg  Status:  Discontinued        400 mg 200 mL/hr over 60 Minutes Intravenous Every 12 hours 09/03/12 0035 09/03/12 0235   09/03/12 0045   metroNIDAZOLE (FLAGYL) IVPB 500 mg  Status:  Discontinued        500 mg 100 mL/hr over 60 Minutes Intravenous Every 8 hours 09/03/12 0035 09/03/12 0235          Assessment/Plan: s/p Procedure(s) (LRB) with comments: EXPLORATORY LAPAROTOMY (N/A) - Exploratory Laparotomy with Sigmoid resection with end colocstomy. COLON RESECTION SIGMOID (N/A) - Sigmoid resection with End Colostomy.  OOBTC today con't abx secondary to intraabd infection con't foley to monitor UOP I would hold his Effient for a few days to allow things to clot surgically.  Things were really oozy in the OR secondary to its use.     LOS: 1 day    Marigene Ehlers., Jed Limerick 09/03/2012

## 2012-09-03 NOTE — Anesthesia Postprocedure Evaluation (Signed)
  Anesthesia Post-op Note  Patient: Johnathan Bauer  Procedure(s) Performed: Procedure(s) (LRB) with comments: EXPLORATORY LAPAROTOMY (N/A) - Exploratory Laparotomy with Sigmoid resection with end colocstomy. COLON RESECTION SIGMOID (N/A) - Sigmoid resection with End Colostomy.  Patient Location: PACU  Anesthesia Type:General  Level of Consciousness: awake  Airway and Oxygen Therapy: Patient Spontanous Breathing  Post-op Pain: mild  Post-op Assessment: Post-op Vital signs reviewed, Patient's Cardiovascular Status Stable, Respiratory Function Stable, Patent Airway, No signs of Nausea or vomiting and Pain level controlled  Post-op Vital Signs: stable  Complications: No apparent anesthesia complications

## 2012-09-03 NOTE — Op Note (Signed)
Pre Operative Diagnosis:  SMA Embolus with compromised bowel  Post Operative Diagnosis: perforated sigmoid diverticulitis  Procedure: exploratory laparotomy, sigmoid colon resection, end colostomy  Surgeon: Dr. Axel Filler  Assistant: Dr. Myra Gianotti  Anesthesia: GETA  EBL: 20 cc  Complications: none  Counts: reported as correct x 2  Findings:  The patient had normal palpable pulses in his SMA per Dr. Estanislado Spire exam.  Patient had a small micro-perforation of the sigmoid colon was found to have free purulent fluid within the abdominal cavity.  The patient did dopplerable signals within the distal bowel.  approximately a 8-10 cm rectal pouch was left within the abdomen.the patient was vascularized and ostomy which came up to the fascia easily.  Indications for procedure:  The patient is a 61 year old male seen an outside hospital and get a CT secondary to abdominal pain. Patient was thought to have a SMA embolus with possible compromised bowel.  Discussed with the patient and family the patient likely bowel resection after exploration. The patient was taken emergently for exploratory laparotomy.  Details of the procedure:  This portion of the op note will encompass the sigmoid resection and ostomy placement.  Dr. Myra Gianotti will dictate the vascular exam portion of this operation.  The patient was taken back to the operating room. The patient was placed in supine position with bilateral SCDs in place. After appropriate anitbiotics were confirmed, a time-out was confirmed and all facts were verified.  The midline wound was dissected down to the fascia Bovie cautery to maintain hemostasis.  Was immediately evident a large amount of free prevalence within the abdomen.  The bowel was examined and seen to be viable. There were areas of inflammation bogginess however there is no ischemic bowel could be seen. The purulentwas traced down to the sigmoid colon which looked to be inflamed.  Is a very small  microperforation in the sigmoid colon.  As I began mobilizing the sigmoid colon on the left white line of Toldt.  The left ureter was identified and protected at all portions of the case.  The proximal portion of the sigmoid colon was stapled across an area of undiseased left colon. This was done with a 75 GIA stapler. A ligature device was used to take the sigmoid mesentery. The rectal portion of the specimen was divided near the area of the convergence of the tinea coli. This was done with a 75 GIA stapler.  This allowed [place] to elevate the sigmoid mesentery and come across with a ligature device. Once done this was sent off to pathology. At this time a 2-0 Prolene was used for tags the rectal stump. Surgical hemostasis and bleeders were stopped with 2-0 Vicryl figure-of-eight. At this time we proceeded to wash out the abdomen with 5 L of sterile saline. Dr. Myra Gianotti  At this time rechecked SMA pulse which was dictated on his portion of the operative.  At this time the descending colon stump was brought through a defect was midway between the umbilicus and the anterior superior iliac spine. This was done using Bovie cautery. The ostomy defect about 3 fingerbreadths to pass. The ostomy was brought up through this opening. We then closed the fascia using a #1 PDS loop in a running fashion x2. While protecting the midline wound the ostomy was matured using 2-0 chromic stitches. The midline wound skin was reapproximated using skin staples. The patient was awakened from general anesthesia and was taken to the ICU in guarded condition.

## 2012-09-03 NOTE — Consult Note (Signed)
Admit date: 09/02/2012 Referring Physician   Dr. Derrell Lolling Primary Cardiologist  [facility] Reason for Consultation  Atrial fibrillation with RVR  HPI: This is a 61yo WM with a history of chronic atrial fibrillation - refused coumadin, CAD s/p PCI on Effient who presented to ER with complaints of severe abdominal pain and was diagnosed with SMA embolus on CT at outside hospital.  He was transferred here and was in atrial fibrillation.  He was taken emergently to the OR and underwent exploratory laparotomy, sigmoid colon resection and end colostomy with no SMA embolism.  During the surgery his HR got up into the 120's and was treated with IV Cardizem boluses.  Currently he is in the PACU with HRs in the 100-110 bpm range.  He is fairly groggy from the anesthesia so cannot get a thorough history but he states his atrial fibrillation is chronic.     PMH:   Past Medical History  Diagnosis Date  . A-fib     chronic afib - did not like coumadin  . Coronary artery disease     s/p PCI 11/2011     PSH:   Past Surgical History  Procedure Date  . Cardiac catheterization     Allergies:  Review of patient's allergies indicates no known allergies. Prior to Admit Meds:   Prescriptions prior to admission  Medication Sig Dispense Refill  . digoxin (LANOXIN) 0.25 MG tablet Take 0.25 mg by mouth daily.      . metoprolol succinate (TOPROL-XL) 50 MG 24 hr tablet Take 50 mg by mouth 2 (two) times daily. Take with or immediately following a meal.      . nitroGLYCERIN (NITROSTAT) 0.4 MG SL tablet Place 0.4 mg under the tongue every 5 (five) minutes as needed. x3 doses For chest pain      . prasugrel (EFFIENT) 10 MG TABS Take 10 mg by mouth daily.       Fam HX:   No family history on file. Social HX:    History   Social History  . Marital Status: Divorced    Spouse Name: N/A    Number of Children: N/A  . Years of Education: N/A   Occupational History  . Not on file.   Social History Main  Topics  . Smoking status: Not on file  . Smokeless tobacco: Not on file  . Alcohol Use:   . Drug Use:   . Sexually Active:    Other Topics Concern  . Not on file   Social History Narrative  . No narrative on file     ROS:  All 11 ROS were addressed and are negative except what is stated in the HPI  Physical Exam: Blood pressure 129/96, pulse 113, temperature 98.3 F (36.8 C), temperature source Oral, resp. rate 17, SpO2 96.00%.    General: Well developed, well nourished, in no acute distress, groggy from anesthesia Head: Eyes PERRLA, No xanthomas.   Normal cephalic and atramatic  Lungs:   Clear bilaterally to auscultation and percussion. Heart:   Irregularly irregular S1 S2 Pulses are 2+ & equal.            No carotid bruit. No JVD.  No abdominal bruits. No femoral bruits. Abdomen: abdomen soft Extremities:   No clubbing, cyanosis or edema.  DP +1 Neuro: groggy from sedation but answers questions  Labs:   Lab Results  Component Value Date   HGB 15.3 09/02/2012   HCT 45.0 09/02/2012    Lab 09/02/12 2246  NA 136  K 5.5*  CL --  CO2 --  BUN --  CREATININE --  CALCIUM --  PROT --  BILITOT --  ALKPHOS --  ALT --  AST --  GLUCOSE --       Radiology:  No results found.  EKG:  Atrial fibrillation with CVR  ASSESSMENT:  1.  Chronic atrial fibrillation with slightly increased HR in the 110's - not on coumadin because patient refused 2.  CAD s/p PCI on Effient 3.  Perforated sigmoid diverticulitis s/p resection and end colostomy 4.  Cardiomegaly on CT scan  PLAN:   1.  Start IV Cardizem gtt at 5mg /hr and titrated to keep HR less than 100bpm  2.  Continue ASA and Effient if ok with surgery - PCI with stent 11/2011  Quintella Reichert, MD  09/03/2012  1:14 AM

## 2012-09-03 NOTE — Progress Notes (Signed)
Johnathan Bauer  61 y.o.  male  Subjective: Abdominal pain persists, but is markedly improved.  He denies chest discomfort, dyspnea or palpitations. Atrial fibrillation has been present for 5-6 years. His first manifestation of coronary disease was stent implantation 9 months ago.  Allergy: Review of patient's allergies indicates no known allergies.  Objective: Vital signs in last 24 hours: Temp:  [97.8 F (36.6 C)-98.3 F (36.8 C)] 98 F (36.7 C) (12/29 1104) Pulse Rate:  [57-136] 83  (12/29 1200) Resp:  [11-25] 16  (12/29 1200) BP: (89-154)/(60-109) 99/68 mmHg (12/29 1200) SpO2:  [91 %-100 %] 97 % (12/29 1200) Arterial Line BP: (90-154)/(49-89) 90/49 mmHg (12/29 1200) Weight:  [82 kg (180 lb 12.4 oz)] 82 kg (180 lb 12.4 oz) (12/29 0630)  82 kg (180 lb 12.4 oz) There is no height on file to calculate BMI.  Weight change:    Intake/Output from previous day: 12/28 0701 - 12/29 0700 In: 3018.2 [I.V.:2416.2; Blood:302; IV Piggyback:300] Out: 315 [Urine:315]  General- Well developed; no acute distress  Neck- No JVD, no carotid bruits Lungs- few basilar rales; normal I:E ratio Cardiovascular- normal PMI; normal S1 and S2; irregular rhythm Abdomen- normal bowel sounds; soft; markedly tender Skin- Warm, no significant lesions Extremities- 1+ distal pulses; no edema  Lab Results: Cardiac Markers:  No results found for this basename: TROPONINI:2,CK,MB:2 in the last 72 hours CBC:   Basename 09/03/12 0500 09/02/12 2246  WBC 15.2* --  HGB 13.1 15.3  HCT 38.3* 45.0  PLT 141* --   BMET:  Basename 09/03/12 0500 09/02/12 2246  NA 134* 136  K 3.8 5.5*  CL 98 --  CO2 22 --  GLUCOSE 181* --  BUN 13 --  CREATININE 0.94 --  CALCIUM 6.9* --   EKG:  09/02/12: Atrial fibrillation/atypical flutter with controlled ventricular rate; right axis deviation; poor R wave progression  Imaging Studies/Results: No results found.  Imaging: No studies to review  Medications:  I have reviewed the  patient's current medications. Scheduled:   . ciprofloxacin  400 mg Intravenous BID  . HYDROmorphone      . HYDROmorphone PCA 0.3 mg/mL   Intravenous Q4H  . HYDROmorphone PCA 0.3 mg/mL      . metronidazole  500 mg Intravenous Q8H  . pantoprazole (PROTONIX) IV  40 mg Intravenous QHS   Infusions:     . dextrose 5% lactated ringers 100 mL/hr at 09/03/12 0700  . diltiazem (CARDIZEM) infusion 8 mg/hr (09/03/12 1000)    Assessment/Plan: Atrial fibrillation: Ventricular rate is controlled with intravenous diltiazem. Although there was initial concern for arterial embolism of cardiac origin, this apparently did not prove to be the case.  Patient has a low risk for thromboembolism related to atrial fibrillation and does not meet standard criteria for full anticoagulation.  The Prasugrel and aspirin with which he has been treated likely provides substantial benefit in reducing the risk of left atrial thrombus .  Arteriosclerotic cardiovascular disease (ASCVD): Risk of stent thrombosis is low 9 months after her procedure, especially if it was not performed in the setting of acute myocardial infarction. Records from [facility] and [facility] have been requested.   Perforated sigmoid colon: Doing well postoperatively.  As soon as the risk of postoperative bleeding is minimal, treatment with Prasugrel can be resumed.  Aspirin should be resumed as soon as possible and will need to be administered rectally.   LOS: 1 day   Johnathan Bauer 09/03/2012, 12:38 PM

## 2012-09-03 NOTE — Transfer of Care (Signed)
Immediate Anesthesia Transfer of Care Note  Patient: Johnathan Bauer  Procedure(s) Performed: Procedure(s) (LRB) with comments: EXPLORATORY LAPAROTOMY (N/A) - Exploratory Laparotomy with Sigmoid resection with end colocstomy. COLON RESECTION SIGMOID (N/A) - Sigmoid resection with End Colostomy.  Patient Location: PACU  Anesthesia Type:General  Level of Consciousness: sedated  Airway & Oxygen Therapy: Patient Spontanous Breathing and Patient connected to nasal cannula oxygen  Post-op Assessment: Report given to PACU RN  Post vital signs: Reviewed and stable  Complications: No apparent anesthesia complications

## 2012-09-04 ENCOUNTER — Inpatient Hospital Stay (HOSPITAL_COMMUNITY): Payer: Managed Care, Other (non HMO)

## 2012-09-04 ENCOUNTER — Encounter (HOSPITAL_COMMUNITY): Payer: Self-pay | Admitting: Surgery

## 2012-09-04 LAB — BASIC METABOLIC PANEL
Calcium: 7.5 mg/dL — ABNORMAL LOW (ref 8.4–10.5)
GFR calc non Af Amer: >90 mL/min (ref >90)
Glucose, Bld: 156 mg/dL — ABNORMAL HIGH (ref 70–99)
Sodium: 138 mEq/L (ref 135–145)

## 2012-09-04 LAB — CBC
MCH: 35.9 pg — ABNORMAL HIGH (ref 26.0–34.0)
MCHC: 34.5 g/dL (ref 30.0–36.0)
Platelets: 99 10*3/uL — ABNORMAL LOW (ref 150–400)
RBC: 3.4 MIL/uL — ABNORMAL LOW (ref 4.22–5.81)

## 2012-09-04 LAB — MAGNESIUM: Magnesium: 1.4 mg/dL — ABNORMAL LOW (ref 1.5–2.5)

## 2012-09-04 MED ORDER — DIGOXIN 0.25 MG/ML IJ SOLN
0.2500 mg | Freq: Once | INTRAMUSCULAR | Status: AC
  Administered 2012-09-04: 0.25 mg via INTRAVENOUS
  Filled 2012-09-04: qty 1

## 2012-09-04 MED ORDER — MAGNESIUM SULFATE 40 MG/ML IJ SOLN
2.0000 g | Freq: Once | INTRAMUSCULAR | Status: AC
  Administered 2012-09-04: 2 g via INTRAVENOUS
  Filled 2012-09-04: qty 50

## 2012-09-04 MED ORDER — LORAZEPAM 2 MG/ML IJ SOLN
1.0000 mg | Freq: Three times a day (TID) | INTRAMUSCULAR | Status: DC | PRN
  Administered 2012-09-04 (×2): 1 mg via INTRAVENOUS
  Filled 2012-09-04 (×4): qty 1

## 2012-09-04 MED ORDER — ALBUTEROL SULFATE (5 MG/ML) 0.5% IN NEBU: 2.5000 mg | INHALATION_SOLUTION | RESPIRATORY_TRACT | Status: DC | PRN

## 2012-09-04 MED ORDER — LORAZEPAM 2 MG/ML IJ SOLN
INTRAMUSCULAR | Status: AC
  Administered 2012-09-04: 1 mg via INTRAVENOUS
  Filled 2012-09-04: qty 1

## 2012-09-04 MED ORDER — ALBUTEROL SULFATE (5 MG/ML) 0.5% IN NEBU
2.5000 mg | INHALATION_SOLUTION | Freq: Four times a day (QID) | RESPIRATORY_TRACT | Status: DC
  Administered 2012-09-04 (×3): 2.5 mg via RESPIRATORY_TRACT
  Filled 2012-09-04 (×3): qty 0.5

## 2012-09-04 MED ORDER — DIGOXIN 0.25 MG/ML IJ SOLN
0.2500 mg | Freq: Every day | INTRAMUSCULAR | Status: DC
  Administered 2012-09-04 – 2012-09-05 (×2): 0.25 mg via INTRAVENOUS
  Filled 2012-09-04 (×3): qty 1

## 2012-09-04 NOTE — Progress Notes (Signed)
2 Days Post-Op  Subjective: Generally doing well, still tender to palpation on abdomen. Dressing has small amount of old drng, ostomy appears well perfused, pink; small amount of fluid in bag.  Objective: Vital signs in last 24 hours: Temp:  [98 F (36.7 C)-98.5 F (36.9 C)] 98.4 F (36.9 C) (12/30 0400) Pulse Rate:  [83-112] 96  (12/30 0600) Resp:  [12-21] 18  (12/30 0600) BP: (93-124)/(60-81) 104/69 mmHg (12/30 0600) SpO2:  [89 %-98 %] 91 % (12/30 0600) Arterial Line BP: (86-122)/(49-64) 122/64 mmHg (12/30 0600) Weight:  [179 lb 7.3 oz (81.4 kg)] 179 lb 7.3 oz (81.4 kg) (12/30 0600)    Intake/Output from previous day: 12/29 0701 - 12/30 0700 In: 2610 [I.V.:2010; IV Piggyback:600] Out: 725 [Urine:725] Intake/Output this shift:    General appearance: alert, cooperative, appears stated age and no distress Chest: coarse rhonchi bilaterally with wheezing, NPC. Cardiac: RRR, No M/R/G Abdomen: Tender to palpation, No BS, Ostomy well perfused pink. Labs: CBC and BMP pending. Mg had been repleted yesterday. CXR: result pend  Lab Results:   Basename 09/03/12 0500 09/02/12 2246  WBC 15.2* --  HGB 13.1 15.3  HCT 38.3* 45.0  PLT 141* --   BMET  Basename 09/03/12 0500 09/02/12 2246  NA 134* 136  K 3.8 5.5*  CL 98 --  CO2 22 --  GLUCOSE 181* --  BUN 13 --  CREATININE 0.94 --  CALCIUM 6.9* --   PT/INR  Basename 09/03/12 0500  LABPROT 18.9*  INR 1.64*   ABG  Basename 09/02/12 2246  PHART 7.368  HCO3 24.9*    Studies/Results: No results found.  Anti-infectives: Anti-infectives     Start     Dose/Rate Route Frequency Ordered Stop   09/04/12 2300   ertapenem (INVANZ) 1 g in sodium chloride 0.9 % 50 mL IVPB  Status:  Discontinued        1 g 100 mL/hr over 30 Minutes Intravenous  Once 09/03/12 0200 09/03/12 0240   09/03/12 0600   ceFAZolin (ANCEF) IVPB 1 g/50 mL premix  Status:  Discontinued     Comments: Send with pt to OR      1 g 100 mL/hr over 30 Minutes  Intravenous On call 09/02/12 2140 09/02/12 2148   09/03/12 0600   ceFAZolin (ANCEF) IVPB 2 g/50 mL premix     Comments: Send with pt to OR      2 g 100 mL/hr over 30 Minutes Intravenous On call 09/02/12 2148 09/02/12 2212   09/03/12 0400   ciprofloxacin (CIPRO) IVPB 400 mg        400 mg 200 mL/hr over 60 Minutes Intravenous 2 times daily 09/03/12 0241     09/03/12 0400   metroNIDAZOLE (FLAGYL) IVPB 500 mg        500 mg 100 mL/hr over 60 Minutes Intravenous 3 times per day 09/03/12 0241     09/03/12 0045   ciprofloxacin (CIPRO) IVPB 400 mg  Status:  Discontinued        400 mg 200 mL/hr over 60 Minutes Intravenous Every 12 hours 09/03/12 0035 09/03/12 0235   09/03/12 0045   metroNIDAZOLE (FLAGYL) IVPB 500 mg  Status:  Discontinued        500 mg 100 mL/hr over 60 Minutes Intravenous Every 8 hours 09/03/12 0035 09/03/12 0235          Assessment/Plan:  Patient Active Problem List  Diagnosis  . Atrial fibrillation  . Arteriosclerotic cardiovascular disease (ASCVD)  . Perforated sigmoid  colon   s/p Procedure(s) (LRB) with comments: EXPLORATORY LAPAROTOMY (N/A) - Exploratory Laparotomy with Sigmoid resection with end colocstomy. COLON RESECTION SIGMOID (N/A) - Sigmoid resection with End Colostomy. Plan: 1. Await labs and CXR report, follow clinical picture 2. Keep NPO 3. Add nebulizer tx qid for pulmonary toilet, encourage IS use. 4. Continue ABX 5. Continue to hold anticoagulants for now. 6. Continue Foley for UOP management 7. OOB  LOS: 2 days    Golda Acre [facility] Surgery Pager # [phone] 09/04/2012

## 2012-09-04 NOTE — Progress Notes (Signed)
UR COMPLETED  

## 2012-09-04 NOTE — Progress Notes (Signed)
Rhonchi/wheeze cleared with coughing. Pt given flutter/IS encouraged to do both to mobilize secretions. Nebs changed to Q4 PRN for SOB. RT will continue to monitor.

## 2012-09-04 NOTE — Progress Notes (Signed)
General Surgery [facility]  Patient seen and examined in ICU.  Clinically improving.  Will allow sips of clear liquids today.  Begin dressing changes to midline wound.  Consult to [facility] nurse for stoma training.  OK to restart anticoagulants per medical service.  Velora Heckler, MD, [facility] Office: [phone]

## 2012-09-04 NOTE — Progress Notes (Signed)
Johnathan Bauer  61 y.o.  male  Subjective: Abdominal pain has improved.  He is drinking water.  He denies chest discomfort, dyspnea or palpitations. Atrial fibrillation has been present for 5-6 years. His first manifestation of coronary disease was stent implantation 9 months ago.  Allergy: Review of patient's allergies indicates no known allergies.  Objective: Vital signs in last 24 hours: Temp:  [98 F (36.7 C)-98.5 F (36.9 C)] 98.4 F (36.9 C) (12/30 0400) Pulse Rate:  [83-113] 106  (12/30 0900) Resp:  [12-21] 19  (12/30 0900) BP: (82-124)/(59-86) 124/68 mmHg (12/30 0900) SpO2:  [89 %-98 %] 94 % (12/30 0900) Arterial Line BP: (86-125)/(49-67) 118/63 mmHg (12/30 0900) Weight:  [179 lb 7.3 oz (81.4 kg)] 179 lb 7.3 oz (81.4 kg) (12/30 0600)  179 lb 7.3 oz (81.4 kg) There is no height on file to calculate BMI.  Weight change: -1 lb 5.2 oz (-0.6 kg)   Intake/Output from previous day: 12/29 0701 - 12/30 0700 In: 2610 [I.V.:2010; IV Piggyback:600] Out: 725 [Urine:725]  General- Well developed; no acute distress  Neck- No JVD, no carotid bruits Lungs- few basilar rales; normal I:E ratio Cardiovascular- Irreg. Irreg.  normal PMI; normal S1 and S2; Abdomen- normal bowel sounds; soft; markedly tender Skin- Warm, no significant lesions Extremities- 1+ distal pulses; no edema  Lab Results: Cardiac Markers:  No results found for this basename: TROPONINI:2,CK,MB:2 in the last 72 hours CBC:    Basename 09/03/12 0500 09/02/12 2246  WBC 15.2* --  HGB 13.1 15.3  HCT 38.3* 45.0  PLT 141* --   BMET:   Basename 09/04/12 0815 09/03/12 0500  NA 138 134*  K 3.4* 3.8  CL 102 98  CO2 25 22  GLUCOSE 156* 181*  BUN 9 13  CREATININE 0.88 0.94  CALCIUM 7.5* 6.9*   EKG:  09/03/12: Atrial fibrillation/atypical flutter with controlled ventricular rate;  Imaging Studies/Results: Dg Chest Port 1 View  09/04/2012  *RADIOLOGY REPORT*  Clinical Data: Pulmonary congestion  PORTABLE CHEST - 1  VIEW  Comparison: 05/25/2012  Findings: 0639 hours.  Stable asymmetric elevation of the right hemidiaphragm.  There is right infrahilar focal airspace disease suggesting pneumonia.  No pulmonary edema. Bone windows reveal no worrisome lytic or sclerotic osseous lesions.  IMPRESSION: Focal opacity at the medial right base suggests pneumonia.  Follow- up imaging is recommended to ensure complete resolution.   Original Report Authenticated By: Kennith Center, M.D.     Imaging: No studies to review  Medications:  I have reviewed the patient's current medications. Scheduled:    . albuterol  2.5 mg Nebulization QID  . ciprofloxacin  400 mg Intravenous BID  . HYDROmorphone PCA 0.3 mg/mL   Intravenous Q4H  . metronidazole  500 mg Intravenous Q8H  . pantoprazole (PROTONIX) IV  40 mg Intravenous QHS   Infusions:      . dextrose 5% lactated ringers 100 mL/hr at 09/04/12 0700  . diltiazem (CARDIZEM) infusion 8 mg/hr (09/04/12 0700)    Assessment/Plan: Atrial fibrillation: Ventricular rate is controlled with intravenous diltiazem.  Would transition to PO Cardizem when he is taking PO   Arteriosclerotic cardiovascular disease (ASCVD): Risk of stent thrombosis is low 9 months after her procedure, especially if it was not performed in the setting of acute myocardial infarction. Records from [facility] and [facility] have been requested.   Perforated sigmoid colon: Doing well postoperatively.  As soon as the risk of postoperative bleeding is minimal, treatment with Prasugrel can be resumed.  Aspirin should be resumed as soon as possible and will need to be administered rectally.   LOS: 2 days   [name]. 09/04/2012, 9:41 AM

## 2012-09-04 NOTE — Progress Notes (Addendum)
Orthopedic Tech Progress Note Patient Details:  Johnathan Bauer [birth year] [id number] Order for abdominal binder in nursing orders. Abdominal binder delivered to bedside nurse at nursing station outside of room. Ortho Devices Type of Ortho Device: Abdominal binder Ortho Device/Splint Interventions: Ordered   [place] R Thompson 09/04/2012, 8:53 AM

## 2012-09-04 NOTE — Consult Note (Signed)
WOC ostomy consult  Stoma type/location: LLQ, end colostomy POD 1 Stomal assessment/size: unable to visualize today, up in chair with abdominal binder in place Output bloody output only in pouch today Ostomy pouching: 2pc 2 3/4" from OR in place. Pt up in chair having lunch, unable to assess stoma today Education provided: provided basic review of ostomy creation.  Educational materials placed at bedside, encouraged pt to review while up out of bed. Will begin post teaching after supplies arrive to the room.   WOC will follow along with you for ostomy care and teaching. Anjelika Ausburn [place] RN,CWOCN [phone]

## 2012-09-05 ENCOUNTER — Encounter (HOSPITAL_COMMUNITY): Payer: Self-pay | Admitting: General Practice

## 2012-09-05 ENCOUNTER — Inpatient Hospital Stay (HOSPITAL_COMMUNITY): Payer: Managed Care, Other (non HMO)

## 2012-09-05 LAB — TYPE AND SCREEN
ABO/RH(D): O NEG
Unit division: 0
Unit division: 0

## 2012-09-05 MED ORDER — DILTIAZEM HCL 60 MG PO TABS
60.0000 mg | ORAL_TABLET | Freq: Four times a day (QID) | ORAL | Status: DC
  Administered 2012-09-05: 60 mg via ORAL
  Filled 2012-09-05 (×3): qty 1

## 2012-09-05 MED ORDER — LORAZEPAM 2 MG/ML IJ SOLN
1.0000 mg | Freq: Four times a day (QID) | INTRAMUSCULAR | Status: DC | PRN
  Administered 2012-09-05: 1 mg via INTRAVENOUS

## 2012-09-05 MED ORDER — LORAZEPAM 1 MG PO TABS
1.0000 mg | ORAL_TABLET | Freq: Four times a day (QID) | ORAL | Status: DC | PRN
  Administered 2012-09-06: 1 mg via ORAL
  Filled 2012-09-05: qty 1

## 2012-09-05 MED ORDER — VITAMIN B-1 100 MG PO TABS
100.0000 mg | ORAL_TABLET | Freq: Every day | ORAL | Status: DC
  Administered 2012-09-05 – 2012-09-06 (×2): 100 mg via ORAL
  Filled 2012-09-05 (×2): qty 1

## 2012-09-05 MED ORDER — MORPHINE SULFATE 2 MG/ML IJ SOLN
1.0000 mg | INTRAMUSCULAR | Status: DC | PRN
  Administered 2012-09-06: 2 mg via INTRAVENOUS
  Filled 2012-09-05: qty 1

## 2012-09-05 MED ORDER — ADULT MULTIVITAMIN W/MINERALS CH
1.0000 | ORAL_TABLET | Freq: Every day | ORAL | Status: DC
  Administered 2012-09-05 – 2012-09-06 (×2): 1 via ORAL
  Filled 2012-09-05 (×2): qty 1

## 2012-09-05 MED ORDER — LORAZEPAM 2 MG/ML IJ SOLN
2.0000 mg | INTRAMUSCULAR | Status: DC | PRN
  Administered 2012-09-06 (×3): 2 mg via INTRAVENOUS
  Filled 2012-09-05: qty 1

## 2012-09-05 MED ORDER — DIGOXIN 0.25 MG/ML IJ SOLN
0.1250 mg | Freq: Every day | INTRAMUSCULAR | Status: DC
  Filled 2012-09-05: qty 0.5

## 2012-09-05 MED ORDER — KCL IN DEXTROSE-NACL 30-5-0.45 MEQ/L-%-% IV SOLN
INTRAVENOUS | Status: DC
  Administered 2012-09-05 – 2012-09-08 (×2): via INTRAVENOUS
  Administered 2012-09-09: 20 mL/h via INTRAVENOUS
  Filled 2012-09-05 (×8): qty 1000

## 2012-09-05 MED ORDER — FOLIC ACID 1 MG PO TABS
1.0000 mg | ORAL_TABLET | Freq: Every day | ORAL | Status: DC
  Administered 2012-09-05 – 2012-09-06 (×2): 1 mg via ORAL
  Filled 2012-09-05 (×2): qty 1

## 2012-09-05 MED ORDER — DILTIAZEM HCL 100 MG IV SOLR
5.0000 mg/h | INTRAVENOUS | Status: AC
  Administered 2012-09-05 – 2012-09-06 (×2): 5 mg/h via INTRAVENOUS
  Filled 2012-09-05 (×3): qty 100

## 2012-09-05 MED ORDER — THIAMINE HCL 100 MG/ML IJ SOLN
100.0000 mg | Freq: Every day | INTRAMUSCULAR | Status: DC
  Administered 2012-09-07 – 2012-09-09 (×3): 100 mg via INTRAVENOUS
  Filled 2012-09-05 (×6): qty 1

## 2012-09-05 MED ORDER — ALBUTEROL SULFATE (5 MG/ML) 0.5% IN NEBU
2.5000 mg | INHALATION_SOLUTION | Freq: Four times a day (QID) | RESPIRATORY_TRACT | Status: DC
  Administered 2012-09-05 – 2012-09-06 (×6): 2.5 mg via RESPIRATORY_TRACT
  Filled 2012-09-05 (×6): qty 0.5

## 2012-09-05 MED ORDER — LORAZEPAM 2 MG/ML IJ SOLN: 0.0000 mg | Freq: Two times a day (BID) | INTRAMUSCULAR | Status: DC

## 2012-09-05 MED ORDER — LORAZEPAM 2 MG/ML IJ SOLN
0.0000 mg | Freq: Four times a day (QID) | INTRAMUSCULAR | Status: DC
  Administered 2012-09-05: 2 mg via INTRAVENOUS
  Administered 2012-09-05: 1 mg via INTRAVENOUS
  Administered 2012-09-05 – 2012-09-06 (×3): 2 mg via INTRAVENOUS
  Filled 2012-09-05 (×5): qty 1

## 2012-09-05 NOTE — Progress Notes (Signed)
Patient ID: Johnathan Bauer, male   DOB: 03/13/1951, 61 y.o.   MRN: [id number]  General Surgery - [facility] - Progress Note  POD# 3  Subjective: Patient in ICU.  Awake and oriented.  No complaints.  Tolerated sips of clear liquids.  Objective: Vital signs in last 24 hours: Temp:  [97.7 F (36.5 C)-98.7 F (37.1 C)] 97.9 F (36.6 C) (12/31 0744) Pulse Rate:  [85-139] 88  (12/31 0800) Resp:  [15-32] 16  (12/31 0800) BP: (106-150)/(66-134) 119/78 mmHg (12/31 0800) SpO2:  [83 %-99 %] 96 % (12/31 0800) Arterial Line BP: (85-185)/(59-105) 105/87 mmHg (12/31 0700)    Intake/Output from previous day: 12/30 0701 - 12/31 0700 In: 5130.5 [P.O.:1440; I.V.:2930.5; IV Piggyback:760] Out: 880 [Urine:830; Stool:50]  Exam: HEENT - clear, not icteric Neck - soft Chest - wheezes bilaterally Cor - irreg @ 92 bpm Abd - soft, mild distension; few BS present; stoma viable but no output Ext - no significant edema Neuro - grossly intact, no focal deficits  Lab Results:   Basename 09/04/12 1200 09/03/12 0500  WBC 9.9 15.2*  HGB 12.2* 13.1  HCT 35.4* 38.3*  PLT 99* 141*     Basename 09/04/12 0815 09/03/12 0500  NA 138 134*  K 3.4* 3.8  CL 102 98  CO2 25 22  GLUCOSE 156* 181*  BUN 9 13  CREATININE 0.88 0.94  CALCIUM 7.5* 6.9*    Studies/Results: Dg Chest Port 1 View  09/04/2012  *RADIOLOGY REPORT*  Clinical Data: Pulmonary congestion  PORTABLE CHEST - 1 VIEW  Comparison: 05/25/2012  Findings: 0639 hours.  Stable asymmetric elevation of the right hemidiaphragm.  There is right infrahilar focal airspace disease suggesting pneumonia.  No pulmonary edema. Bone windows reveal no worrisome lytic or sclerotic osseous lesions.  IMPRESSION: Focal opacity at the medial right base suggests pneumonia.  Follow- up imaging is recommended to ensure complete resolution.   Original Report Authenticated By: Kennith Center, M.D.     Assessment / Plan: 1.  Status post Hartmann's  resection for perforated diverticulitis  Advance to clear liquid diet today  Continue IV abx  OOB  Discontinue Foley catheter  Transfer to telemetry today 2.  Atrial fibrillation  Cardiology following  OK to resume anticoagulation now  Telemetry monitor 3.  COPD  Albuterol QID  Encouraged pulmonary toilet  OOB  Velora Heckler, MD, [facility] Office: [phone]  09/05/2012

## 2012-09-05 NOTE — Progress Notes (Signed)
Pt unable to void since foley D/Cd at 09300 on 2300. I/C cath doe at this time. Received 22cc of tea colored urine.

## 2012-09-05 NOTE — Consult Note (Signed)
WOC ostomy follow up  Stoma type/location: LLQ, end colostomy Stomal assessment/size: 1 3/8" round, budded, pink and moist Peristomal assessment: intact with some blistering distal edges of the tape border of the ostomy wafer, will monitor Treatment options for stomal/peristomal skin:none used today  Output minimal, serous drainage in pouch Ostomy pouching: 2pc. 2 3/4" used today, will order smaller wafer 2 1/4" for next pouch change Education provided: reviewed ostomy creation multiple times with this pt.  He is still not clear on this and seems to lack understanding of the surgery, the plans for the care of his ostomy.  Does not understand after multiple attempt by the [facility] nurse to explain that the stoma is the intestine that has been brought to the skin and that he will pass stool here now. I am not sure if this is related to narcotics on board or what his baseline mentality is like. Will continue to assess.  Reviewed ostomy educational materials and left them out on his bedside table for him to review.  Demonstrated pouch change today for pt. Will follow up Thursday for more teaching.  WOC will follow along with you for continued ostomy teaching and support Renalda Locklin B and E RN,CWOCN [phone]

## 2012-09-05 NOTE — Progress Notes (Signed)
Johnathan Bauer  61 y.o.  male  Subjective: Abdominal pain has improved.  He is drinking water.  He denies chest discomfort, dyspnea or palpitations. Atrial fibrillation has been present for 5-6 years. His ventricular rate has been elevated.  We started dogoxin with improvement in his rate.  Allergy: Review of patient's allergies indicates no known allergies.  Objective: Vital signs in last 24 hours: Temp:  [97.7 F (36.5 C)-98.7 F (37.1 C)] 97.9 F (36.6 C) (12/31 0744) Pulse Rate:  [85-139] 91  (12/31 1000) Resp:  [13-32] 13  (12/31 1000) BP: (106-150)/(66-134) 135/89 mmHg (12/31 1000) SpO2:  [83 %-99 %] 96 % (12/31 1000) Arterial Line BP: (85-185)/(59-105) 144/94 mmHg (12/31 0900)  179 lb 7.3 oz (81.4 kg) There is no height on file to calculate BMI.  Weight change:    Intake/Output from previous day: 12/30 0701 - 12/31 0700 In: 5130.5 [P.O.:1440; I.V.:2930.5; IV Piggyback:760] Out: 880 [Urine:830; Stool:50]  General- Well developed; no acute distress  Neck- No JVD, no carotid bruits Lungs- few basilar rales; normal I:E ratio Cardiovascular- Irreg. Irreg.  normal PMI; normal S1 and S2; Abdomen- normal bowel sounds; soft; markedly tender Skin- Warm, no significant lesions Extremities- 1+ distal pulses; no edema  Lab Results: Cardiac Markers:  No results found for this basename: TROPONINI:2,CK,MB:2 in the last 72 hours CBC:    Basename 09/04/12 1200 09/03/12 0500  WBC 9.9 15.2*  HGB 12.2* 13.1  HCT 35.4* 38.3*  PLT 99* 141*   BMET:   Basename 09/04/12 0815 09/03/12 0500  NA 138 134*  K 3.4* 3.8  CL 102 98  CO2 25 22  GLUCOSE 156* 181*  BUN 9 13  CREATININE 0.88 0.94  CALCIUM 7.5* 6.9*   EKG:  09/03/12: Atrial fibrillation/atypical flutter with controlled ventricular rate;  Imaging Studies/Results: Dg Chest Port 1 View  09/05/2012  *RADIOLOGY REPORT*  Clinical Data: Status post exploratory laparotomy  PORTABLE CHEST - 1 VIEW  Comparison: 09/02/2012  Findings:  Heart size is mildly enlarged.  There is asymmetric elevation of the right hemidiaphragm.  Persistent airspace opacity within the medial right lung base.  Left lung is clear.  IMPRESSION:  1.  Persistent opacity within the medial right lung base.  Favored to represent either aspiration or pneumonia.  Continued follow-up imaging advised.   Original Report Authenticated By: Signa Kell, M.D.    Dg Chest Port 1 View  09/04/2012  *RADIOLOGY REPORT*  Clinical Data: Pulmonary congestion  PORTABLE CHEST - 1 VIEW  Comparison: 05/25/2012  Findings: 0639 hours.  Stable asymmetric elevation of the right hemidiaphragm.  There is right infrahilar focal airspace disease suggesting pneumonia.  No pulmonary edema. Bone windows reveal no worrisome lytic or sclerotic osseous lesions.  IMPRESSION: Focal opacity at the medial right base suggests pneumonia.  Follow- up imaging is recommended to ensure complete resolution.   Original Report Authenticated By: Kennith Center, M.D.     Imaging: No studies to review  Medications:  I have reviewed the patient's current medications. Scheduled:    . albuterol  2.5 mg Nebulization Q6H  . ciprofloxacin  400 mg Intravenous BID  . digoxin  0.25 mg Intravenous Daily  . HYDROmorphone PCA 0.3 mg/mL   Intravenous Q4H  . metronidazole  500 mg Intravenous Q8H  . pantoprazole (PROTONIX) IV  40 mg Intravenous QHS   Infusions:      . dexrose 5 % and 0.45 % NaCl with KCl 30 mEq/L 75 mL/hr at 09/05/12 0926  . diltiazem (CARDIZEM) infusion  15 mg/hr (09/05/12 0900)    Assessment/Plan: Atrial fibrillation: Ventricular rate and he is currently not getting  intravenous diltiazem.  Would transition to PO Cardizem when he is taking PO.  I will decrease the dose of IV digoxin for now.  CAD: I would prefer that he get ASA 81 mg a day if OK with sugery.      LOS: 3 days   [name]. 09/05/2012, 10:56 AM

## 2012-09-05 NOTE — Progress Notes (Addendum)
Bladder scan completed- 315 ml pt has not voided since foley removed. Pt stood at bedside to attempt to void unable to void.  Egbert Garibaldi A

## 2012-09-05 NOTE — Progress Notes (Signed)
Pt transferred to 2014 while on the monitor, pt tolerated well.  Report given to RN.

## 2012-09-05 NOTE — Progress Notes (Signed)
Dr paged at 1900 in regards to pts heart rate between 128-148.Marland Kitchenand questionable discharge of pca pump

## 2012-09-05 NOTE — Progress Notes (Signed)
Ward Givens PA restated cardizem 60mg  PO four times/day. Notified Dr Carolynne Edouard and will tranfer pt to 2615.

## 2012-09-05 NOTE — Progress Notes (Signed)
Pt becoming agitated and confused at this time. Pt pulled out IV and trying to get out of bed. Pt moved to 2002 to be more visible from nurses station and to be more closely monitored. Pt also states that he drinks 3-4 beers a day. Notified Dr Carolynne Edouard of pts changes and received new orders to begin the ativan for alcohol withdrawal. Will continue to monitor pt.

## 2012-09-05 NOTE — Progress Notes (Signed)
Pt HR consistently 120s-150s after receiving 2mg  Ativan for agitation. Cardizem drip D/Cd today on 2300. Notified Dr Carolynne Edouard. New orders to transfer to stepdown and restart cardizem drip. Will notify Saralyn Pilar, PA on call for Cardiology.

## 2012-09-05 NOTE — Progress Notes (Signed)
Report called to [facility] RN on 2600.

## 2012-09-06 ENCOUNTER — Inpatient Hospital Stay (HOSPITAL_COMMUNITY): Payer: Managed Care, Other (non HMO)

## 2012-09-06 DIAGNOSIS — F10931 Alcohol use, unspecified with withdrawal delirium: Secondary | ICD-10-CM | POA: Diagnosis not present

## 2012-09-06 DIAGNOSIS — F10231 Alcohol dependence with withdrawal delirium: Secondary | ICD-10-CM | POA: Diagnosis not present

## 2012-09-06 DIAGNOSIS — I4891 Unspecified atrial fibrillation: Secondary | ICD-10-CM

## 2012-09-06 DIAGNOSIS — J189 Pneumonia, unspecified organism: Secondary | ICD-10-CM | POA: Insufficient documentation

## 2012-09-06 DIAGNOSIS — N189 Chronic kidney disease, unspecified: Secondary | ICD-10-CM | POA: Insufficient documentation

## 2012-09-06 DIAGNOSIS — Y95 Nosocomial condition: Secondary | ICD-10-CM | POA: Insufficient documentation

## 2012-09-06 DIAGNOSIS — J9600 Acute respiratory failure, unspecified whether with hypoxia or hypercapnia: Secondary | ICD-10-CM | POA: Insufficient documentation

## 2012-09-06 DIAGNOSIS — K631 Perforation of intestine (nontraumatic): Secondary | ICD-10-CM

## 2012-09-06 HISTORY — DX: Pneumonia, unspecified organism: J18.9

## 2012-09-06 HISTORY — DX: Chronic kidney disease, unspecified: N18.9

## 2012-09-06 LAB — CBC
HCT: 35.1 % — ABNORMAL LOW (ref 39.0–52.0)
MCH: 36.3 pg — ABNORMAL HIGH (ref 26.0–34.0)
MCV: 102 fL — ABNORMAL HIGH (ref 78.0–100.0)
RDW: 12.9 % (ref 11.5–15.5)
WBC: 7.3 10*3/uL (ref 4.0–10.5)

## 2012-09-06 LAB — POCT I-STAT 3, ART BLOOD GAS (G3+)
Patient temperature: 101.1
pH, Arterial: 7.464 — ABNORMAL HIGH (ref 7.350–7.450)

## 2012-09-06 LAB — LACTIC ACID, PLASMA: Lactic Acid, Venous: 1.5 mmol/L (ref 0.5–2.2)

## 2012-09-06 LAB — COMPREHENSIVE METABOLIC PANEL
AST: 23 U/L (ref 0–37)
Albumin: 2.3 g/dL — ABNORMAL LOW (ref 3.5–5.2)
Calcium: 7.8 mg/dL — ABNORMAL LOW (ref 8.4–10.5)
Chloride: 96 mEq/L (ref 96–112)
Creatinine, Ser: 0.75 mg/dL (ref 0.50–1.35)
Total Protein: 4.8 g/dL — ABNORMAL LOW (ref 6.0–8.3)

## 2012-09-06 LAB — DIGOXIN LEVEL: Digoxin Level: 0.8 ng/mL (ref 0.8–2.0)

## 2012-09-06 LAB — APTT: aPTT: 35 seconds (ref 24–37)

## 2012-09-06 LAB — GLUCOSE, CAPILLARY

## 2012-09-06 LAB — MAGNESIUM: Magnesium: 1.1 mg/dL — ABNORMAL LOW (ref 1.5–2.5)

## 2012-09-06 MED ORDER — CHLORHEXIDINE GLUCONATE 0.12 % MT SOLN
15.0000 mL | Freq: Two times a day (BID) | OROMUCOSAL | Status: DC
  Administered 2012-09-07 – 2012-09-11 (×9): 15 mL via OROMUCOSAL
  Filled 2012-09-06 (×9): qty 15

## 2012-09-06 MED ORDER — MIDAZOLAM HCL 2 MG/2ML IJ SOLN
2.0000 mg | INTRAMUSCULAR | Status: DC | PRN
  Filled 2012-09-06: qty 6
  Filled 2012-09-06: qty 4

## 2012-09-06 MED ORDER — POTASSIUM CHLORIDE 10 MEQ/50ML IV SOLN
10.0000 meq | INTRAVENOUS | Status: AC
  Administered 2012-09-06 (×2): 10 meq via INTRAVENOUS
  Filled 2012-09-06 (×2): qty 50

## 2012-09-06 MED ORDER — IPRATROPIUM-ALBUTEROL 18-103 MCG/ACT IN AERO
4.0000 | INHALATION_SPRAY | RESPIRATORY_TRACT | Status: DC | PRN
  Filled 2012-09-06: qty 14.7

## 2012-09-06 MED ORDER — DIGOXIN 250 MCG PO TABS
0.2500 mg | ORAL_TABLET | Freq: Every day | ORAL | Status: DC
  Administered 2012-09-06: 0.25 mg via ORAL
  Filled 2012-09-06: qty 1

## 2012-09-06 MED ORDER — METOPROLOL TARTRATE 1 MG/ML IV SOLN: 5.0000 mg | INTRAVENOUS | Status: DC | PRN

## 2012-09-06 MED ORDER — INSULIN ASPART 100 UNIT/ML ~~LOC~~ SOLN
0.0000 [IU] | SUBCUTANEOUS | Status: DC
  Administered 2012-09-07: 2 [IU] via SUBCUTANEOUS

## 2012-09-06 MED ORDER — SODIUM CHLORIDE 0.9 % IV SOLN
25.0000 ug/h | INTRAVENOUS | Status: DC
  Administered 2012-09-06: 50 ug/h via INTRAVENOUS
  Filled 2012-09-06: qty 50

## 2012-09-06 MED ORDER — HEPARIN SODIUM (PORCINE) 5000 UNIT/ML IJ SOLN
5000.0000 [IU] | Freq: Three times a day (TID) | INTRAMUSCULAR | Status: DC
  Administered 2012-09-06 – 2012-09-07 (×3): 5000 [IU] via SUBCUTANEOUS
  Filled 2012-09-06 (×5): qty 1

## 2012-09-06 MED ORDER — DEXMEDETOMIDINE HCL IN NACL 400 MCG/100ML IV SOLN
0.2000 ug/kg/h | INTRAVENOUS | Status: AC
  Administered 2012-09-06: 0.3 ug/kg/h via INTRAVENOUS
  Administered 2012-09-06: 1 ug/kg/h via INTRAVENOUS
  Administered 2012-09-07 (×2): 0.7 ug/kg/h via INTRAVENOUS
  Administered 2012-09-07: 1 ug/kg/h via INTRAVENOUS
  Administered 2012-09-07: 0.8 ug/kg/h via INTRAVENOUS
  Administered 2012-09-08: 0.2 ug/kg/h via INTRAVENOUS
  Filled 2012-09-06 (×4): qty 100
  Filled 2012-09-06 (×3): qty 50
  Filled 2012-09-06 (×4): qty 100

## 2012-09-06 MED ORDER — FOLIC ACID 1 MG PO TABS
1.0000 mg | ORAL_TABLET | Freq: Every day | ORAL | Status: DC
  Filled 2012-09-06: qty 1

## 2012-09-06 MED ORDER — FOLIC ACID 5 MG/ML IJ SOLN
1.0000 mg | Freq: Every day | INTRAMUSCULAR | Status: DC
  Administered 2012-09-07 – 2012-09-09 (×3): 1 mg via INTRAVENOUS
  Filled 2012-09-06 (×4): qty 0.2

## 2012-09-06 MED ORDER — VANCOMYCIN HCL 10 G IV SOLR
1250.0000 mg | Freq: Two times a day (BID) | INTRAVENOUS | Status: DC
  Administered 2012-09-06 – 2012-09-12 (×12): 1250 mg via INTRAVENOUS
  Filled 2012-09-06 (×14): qty 1250

## 2012-09-06 MED ORDER — FENTANYL BOLUS VIA INFUSION
25.0000 ug | Freq: Four times a day (QID) | INTRAVENOUS | Status: DC | PRN
  Filled 2012-09-06: qty 100

## 2012-09-06 MED ORDER — PIPERACILLIN-TAZOBACTAM 3.375 G IVPB
3.3750 g | Freq: Three times a day (TID) | INTRAVENOUS | Status: DC
  Administered 2012-09-06 – 2012-09-13 (×22): 3.375 g via INTRAVENOUS
  Filled 2012-09-06 (×25): qty 50

## 2012-09-06 MED ORDER — FUROSEMIDE 10 MG/ML IJ SOLN
INTRAMUSCULAR | Status: AC
  Administered 2012-09-06: 20 mg
  Filled 2012-09-06: qty 4

## 2012-09-06 MED ORDER — FENTANYL CITRATE 0.05 MG/ML IJ SOLN
25.0000 ug | INTRAMUSCULAR | Status: DC | PRN
  Filled 2012-09-06: qty 2

## 2012-09-06 MED ORDER — BIOTENE DRY MOUTH MT LIQD
15.0000 mL | Freq: Four times a day (QID) | OROMUCOSAL | Status: DC
  Administered 2012-09-07 – 2012-09-11 (×17): 15 mL via OROMUCOSAL
  Filled 2012-09-06: qty 15

## 2012-09-06 MED ORDER — ACETAMINOPHEN 160 MG/5ML PO SOLN
650.0000 mg | ORAL | Status: DC | PRN
  Administered 2012-09-07: 650 mg
  Filled 2012-09-06: qty 20.3

## 2012-09-06 MED ORDER — FUROSEMIDE 10 MG/ML IJ SOLN
20.0000 mg | INTRAMUSCULAR | Status: AC
  Administered 2012-09-06: 20 mg via INTRAVENOUS

## 2012-09-06 MED ORDER — METOPROLOL TARTRATE 25 MG PO TABS
25.0000 mg | ORAL_TABLET | Freq: Two times a day (BID) | ORAL | Status: DC
  Administered 2012-09-06: 25 mg via ORAL
  Filled 2012-09-06 (×2): qty 1

## 2012-09-06 MED ORDER — ASPIRIN EC 81 MG PO TBEC
81.0000 mg | DELAYED_RELEASE_TABLET | Freq: Every day | ORAL | Status: DC
  Administered 2012-09-06 – 2012-09-19 (×14): 81 mg via ORAL
  Filled 2012-09-06 (×14): qty 1

## 2012-09-06 MED ORDER — MAGNESIUM SULFATE 40 MG/ML IJ SOLN
2.0000 g | Freq: Once | INTRAMUSCULAR | Status: AC
  Administered 2012-09-06: 2 g via INTRAVENOUS
  Filled 2012-09-06: qty 50

## 2012-09-06 MED ORDER — NITROGLYCERIN 2 % TD OINT
0.5000 [in_us] | TOPICAL_OINTMENT | TRANSDERMAL | Status: DC
  Filled 2012-09-06: qty 30

## 2012-09-06 NOTE — Progress Notes (Signed)
RR reduced to 14 per MD order.

## 2012-09-06 NOTE — Progress Notes (Signed)
ANTIBIOTIC CONSULT NOTE - INITIAL  Pharmacy Consult:  Vancomycin / Zosyn Indication:  Suspected PNA  No Known Allergies  Patient Measurements: Height: 6' (182.9 cm) Weight: 179 lb 7.3 oz (81.4 kg) IBW/kg (Calculated) : 77.6   Vital Signs: Temp: 98 F (36.7 C) (01/01 1209) Temp src: Oral (01/01 1209) BP: 125/85 mmHg (01/01 1209) Pulse Rate: 88  (01/01 1209) Intake/Output from previous day: 12/31 0701 - 01/01 0700 In: 1455 [P.O.:360; I.V.:995; IV Piggyback:100] Out: 270 [Urine:270]  Labs:  [facility] 09/04/12 1200 09/04/12 0815  WBC 9.9 --  HGB 12.2* --  PLT 99* --  LABCREA -- --  CREATININE -- 0.88   Estimated Creatinine Clearance: 96.8 ml/min (by C-G formula based on Cr of 0.88). No results found for this basename: VANCOTROUGH:2,VANCOPEAK:2,VANCORANDOM:2,GENTTROUGH:2,GENTPEAK:2,GENTRANDOM:2,TOBRATROUGH:2,TOBRAPEAK:2,TOBRARND:2,AMIKACINPEAK:2,AMIKACINTROU:2,AMIKACIN:2, in the last 72 hours   Microbiology: Recent Results (from the past 720 hour(s))  MRSA PCR SCREENING     Status: Normal   Collection Time   09/03/12  3:00 AM      Component Value Range Status Comment   MRSA by PCR NEGATIVE  NEGATIVE Final     Medical History: Past Medical History  Diagnosis Date  . A-fib     chronic afib - did not like coumadin  . Coronary artery disease     s/p PCI  . 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  Now s/p ex-lap with sigmoid resection/end colostomy.  Noted s/p rapid response for respiratory distress and patient transferred to 2300.  Pharmacy consulted to manage vancomycin and Zosyn for suspected HCAP.  Noted he was previously on Cipro and Flagyl.  His renal function has been stable; however, the documented urine output has been minimal.  Flagyl 12/28 >> 1/1 Cipro 12/28 >> 1/1 Vanc 1/1 >> Zosyn 1/1 >>   Goal of Therapy:  Vancomycin trough level 15-20  mcg/ml   Plan:  - Vanc 1250mg  IV Q12H - Zosyn 3.375gm IV Q8H, 4 hr infusion - Monitor renal fxn, clinical course, vanc trough at steady-state in 2-3 days - F/U resume folic acid and multivitamin when access established - F/U anticoagulation plan for SMA thrombus - Consider additional magnesium supplementation (goal ~2 for patient in Afib)     Favian Kittleson D. Laney Potash, PharmD, BCPS Pager:  [phone] 09/06/2012, 4:38 PM

## 2012-09-06 NOTE — Progress Notes (Signed)
Johnathan Bauer  62 y.o.  male  Subjective: Pt is a 62 yo with hx of chronic a-fib.  Admitted with acute abdomen.  He is now on clear liquid diet.  He developed DTs last night.  Allergy: Review of patient's allergies indicates no known allergies.  Objective: Vital signs in last 24 hours: Temp:  [98.1 F (36.7 C)-99 F (37.2 C)] 98.9 F (37.2 C) (01/01 0809) Pulse Rate:  [85-130] 111  (01/01 0809) Resp:  [13-27] 25  (01/01 0809) BP: (112-179)/(67-117) 136/117 mmHg (01/01 0809) SpO2:  [90 %-97 %] 92 % (01/01 0817) Arterial Line BP: (144)/(94) 144/94 mmHg (12/31 0900)  179 lb 7.3 oz (81.4 kg) There is no height on file to calculate BMI.  Weight change:  Last BM Date: 09/05/12 Intake/Output from previous day: 12/31 0701 - 01/01 0700 In: 1455 [P.O.:360; I.V.:995; IV Piggyback:100] Out: 270 [Urine:270]  General- sedated, aggitated Neck- No JVD, no carotid bruits Lungs- bilateral wheezes Cardiovascular- Irreg. Irreg.  normal PMI; normal S1 and S2; Abdomen- normal bowel sounds; soft; markedly tender Skin- Warm, no significant lesions Extremities- 1+ distal pulses; no edema  Lab Results: Cardiac Markers:  No results found for this basename: TROPONINI:2,CK,MB:2 in the last 72 hours CBC:    Basename 09/04/12 1200  WBC 9.9  HGB 12.2*  HCT 35.4*  PLT 99*   BMET:   Basename 09/04/12 0815  NA 138  K 3.4*  CL 102  CO2 25  GLUCOSE 156*  BUN 9  CREATININE 0.88  CALCIUM 7.5*   EKG:  09/03/12: Atrial fibrillation/atypical flutter with controlled ventricular rate;  Imaging Studies/Results: Dg Chest Port 1 View  09/05/2012  *RADIOLOGY REPORT*  Clinical Data: Status post exploratory laparotomy  PORTABLE CHEST - 1 VIEW  Comparison: 09/02/2012  Findings: Heart size is mildly enlarged.  There is asymmetric elevation of the right hemidiaphragm.  Persistent airspace opacity within the medial right lung base.  Left lung is clear.  IMPRESSION:  1.  Persistent opacity within the medial right  lung base.  Favored to represent either aspiration or pneumonia.  Continued follow-up imaging advised.   Original Report Authenticated By: Signa Kell, M.D.     Imaging: No studies to review  Medications:  I have reviewed the patient's current medications. Scheduled:    . albuterol  2.5 mg Nebulization Q6H  . ciprofloxacin  400 mg Intravenous BID  . digoxin  0.125 mg Intravenous Daily  . folic acid  1 mg Oral Daily  . LORazepam  0-4 mg Intravenous Q6H   Followed by  . LORazepam  0-4 mg Intravenous Q12H  . metronidazole  500 mg Intravenous Q8H  . multivitamin with minerals  1 tablet Oral Daily  . pantoprazole (PROTONIX) IV  40 mg Intravenous QHS  . thiamine  100 mg Oral Daily   Or  . thiamine  100 mg Intravenous Daily   Infusions:      . dexrose 5 % and 0.45 % NaCl with KCl 30 mEq/L 75 mL/hr at 09/05/12 1900  . diltiazem (CARDIZEM) infusion 5 mg/hr (09/06/12 0600)    Assessment/Plan: Atrial fibrillation: Ventricular rate is OK.  He is agitated because of his DTs.  Will change back to his PO meds (Metoprolol 25 BID and Digoxin .25 mg daily) .  No further suggestions.   Will sign off. Call for questions.  CAD: I would prefer that he get ASA 81 mg a day if OK with sugery.      LOS: 4 days   Johnathan Chance  Nahser Montez Bauer. 09/06/2012, 8:49 AM

## 2012-09-06 NOTE — Progress Notes (Signed)
RT at bedside.  Pt combative.  Upon assessment part of pt teeth found in his mouth broken off.  Placed broken teeth in cup with pt label on it at bedside.  Will continue to monitor.

## 2012-09-06 NOTE — Progress Notes (Signed)
The patient currently agitated   Fentanyl drip initiated for sedation

## 2012-09-06 NOTE — Progress Notes (Signed)
4 Days Post-Op  Subjective: Called with worsening respiratory distress, and worsening DT's.  He cannot really answer questions, he can follow instructions only if you stay in his face.  Objective: Vital signs in last 24 hours: Temp:  [98 F (36.7 C)-98.9 F (37.2 C)] 98 F (36.7 C) (01/01 1209) Pulse Rate:  [85-130] 88  (01/01 1209) Resp:  [16-27] 26  (01/01 1209) BP: (112-179)/(67-117) 125/85 mmHg (01/01 1209) SpO2:  [90 %-97 %] 92 % (01/01 1209) Last BM Date: 09/05/12  Fluid balance 10+liters,   Intake/Output from previous day: 12/31 0701 - 01/01 0700 In: 1455 [P.O.:360; I.V.:995; IV Piggyback:100] Out: 270 [Urine:270] Intake/Output this shift:    General appearance: alert, uncooperative and having ongoing DT's. Resp: wheezing bilateral, basilar rale and ronchi, can't clear his secretions,  Extremities: no edema.  Lab Results:   Basename 09/04/12 1200  WBC 9.9  HGB 12.2*  HCT 35.4*  PLT 99*    BMET  Basename 09/04/12 0815  NA 138  K 3.4*  CL 102  CO2 25  GLUCOSE 156*  BUN 9  CREATININE 0.88  CALCIUM 7.5*   PT/INR No results found for this basename: LABPROT:2,INR:2 in the last 72 hours  No results found for this basename: AST:5,ALT:5,ALKPHOS:5,BILITOT:5,PROT:5,ALBUMIN:5 in the last 168 hours   Lipase  No results found for this basename: lipase     Studies/Results: Dg Chest Port 1 View  09/05/2012  *RADIOLOGY REPORT*  Clinical Data: Status post exploratory laparotomy  PORTABLE CHEST - 1 VIEW  Comparison: 09/02/2012  Findings: Heart size is mildly enlarged.  There is asymmetric elevation of the right hemidiaphragm.  Persistent airspace opacity within the medial right lung base.  Left lung is clear.  IMPRESSION:  1.  Persistent opacity within the medial right lung base.  Favored to represent either aspiration or pneumonia.  Continued follow-up imaging advised.   Original Report Authenticated By: Signa Kell, M.D.     Medications:    . albuterol  2.5  mg Nebulization Q6H  . aspirin EC  81 mg Oral Daily  . ciprofloxacin  400 mg Intravenous BID  . digoxin  0.25 mg Oral Daily  . folic acid  1 mg Oral Daily  . LORazepam  0-4 mg Intravenous Q6H   Followed by  . LORazepam  0-4 mg Intravenous Q12H  . metoprolol tartrate  25 mg Oral BID  . metronidazole  500 mg Intravenous Q8H  . multivitamin with minerals  1 tablet Oral Daily  . pantoprazole (PROTONIX) IV  40 mg Intravenous QHS  . thiamine  100 mg Oral Daily   Or  . thiamine  100 mg Intravenous Daily  .    . dexrose 5 % and 0.45 % NaCl with KCl 30 mEq/L 75 mL/hr at 09/05/12 1900  he is still on cardizem at 5 mg/hr  Assessment/Plan EXPLORATORY LAPAROTOMY (N/A) - Exploratory Laparotomy with Sigmoid resection with end colocstomy.  COLON RESECTION SIGMOID (N/A) - Sigmoid resection with End Colostomy.  perforated sigmoid diverticulitis, pre op dx:  SMA embolus, with compromised bowel. Procedure: exploratory laparotomy, sigmoid colon resection, end colostomy, 09/03/2012, Axel Filler, MD  DT's with worsening respiratory distress, he's had about 11mg  of ativan today so far.   Urinary retention  Leave foley given mental status  Restrain as needed   Plan:  I have repeated HHN with Atrovent and xopenex, lasix 20 mg IV, and 1/2 inch nitro paste, I have also called CCM to see and help manage. CXR just performed too. Transfer  to ICU.  LOS: 4 days    Christopher Glasscock 09/06/2012

## 2012-09-06 NOTE — Progress Notes (Signed)
Dr. Carolynne Edouard made aware of increasing restlessness and agitation.New orders received to administer Ativan 2-4 mg q4hrs.

## 2012-09-06 NOTE — Progress Notes (Signed)
5 mg Versed, 100 fentanyl, and 10 amidate given for extubation at 1610.   5 mg Versed given for Central line placement at 1620.   Vital signs monitored and remain stable.

## 2012-09-06 NOTE — Progress Notes (Signed)
Hypomagnesemia and Hypokalemia   K and Mg replaced

## 2012-09-06 NOTE — Progress Notes (Signed)
Report to Tresa Endo, Charity fundraiser.. Transferred to 2306.

## 2012-09-06 NOTE — Procedures (Signed)
Central Venous Catheter Insertion Procedure Note Martese Vanatta [id number] 04-28-1951  Procedure: Insertion of Central Venous Catheter Indications: Drug and/or fluid administration  Procedure Details Consent: Unable to obtain consent because of emergent medical necessity. Time Out: Verified patient identification, verified procedure, site/side was marked, verified correct patient position, special equipment/implants available, medications/allergies/relevent history reviewed, required imaging and test results available.  Performed  Maximum sterile technique was used including antiseptics, cap, gloves, gown, hand hygiene, mask and sheet. Skin prep: Chlorhexidine; local anesthetic administered A antimicrobial bonded/coated triple lumen catheter was placed in the left subclavian vein using the Seldinger technique.  Used ultrasound guidance.  Evaluation Blood flow good Complications: No apparent complications Patient did tolerate procedure well. Chest X-ray ordered to verify placement.  CXR: pending.  [name], MD [facility] Pulmonary/Critical Care 09/06/2012, 4:52 PM Pager:  [phone] After 3pm call: [phone]

## 2012-09-06 NOTE — Procedures (Signed)
Intubation Procedure Note Johnathan Bauer [id number] [birth year]  Procedure: Intubation Indications: Respiratory insufficiency  Procedure Details Consent: Unable to obtain consent because of emergent medical necessity. Time Out: Verified patient identification, verified procedure, site/side was marked, verified correct patient position, special equipment/implants available, medications/allergies/relevent history reviewed, required imaging and test results available.  Performed  Maximum sterile technique was used including gloves, hand hygiene and mask.  MAC and 3  Given 10 mg versed, 100 mcg fentanyl, 10 mg etomidate.  Evaluation Hemodynamic Status: BP stable throughout; O2 sats: stable throughout Patient's Current Condition: stable Complications: No apparent complications Patient did tolerate procedure well. Chest X-ray ordered to verify placement.  CXR: pending.  [name], MD [facility] Pulmonary/Critical Care 09/06/2012, 4:51 PM Pager:  [phone] After 3pm call: [phone]

## 2012-09-06 NOTE — Consult Note (Signed)
PULMONARY  / CRITICAL CARE MEDICINE  Name: Johnathan Bauer MRN: [id number] DOB: [birth year]    LOS: 4  REFERRING MD :  Abigail Miyamoto  CHIEF COMPLAINT:  Short of breath  BRIEF PATIENT DESCRIPTION:  62 yo male smoker transferred from [place] on 09/02/2012 with abdominal pain with lactic acidosis, and found to have perforated sigmoid diverticulitis.  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  Developed respiratory distress and confusion 12/31>>transferred to SDU, placed on oxygen and CIWA started.  Developed fever from PNA, VDRF 1/01 and transferred to ICU with PCCM consult.   Significant PMHx CAD s/p stent March 2013, A fib (did not tolerate coumadin), ETOH (at least 3 to 4 beers/day)   LINES / TUBES: ETT 1/01 >> Lt [place] CVL 1/01>>  CULTURES: Blood 1/01>> Sputum 1/01>>  ANTIBIOTICS: Ancef 12/28>>12/28 Cipro 12/28>>1/01 Flagyl 12/28>>1/01 Vancomycin 1/01>> Zosyn 1/01>>  SIGNIFICANT EVENTS:  12/29 Laparotomy 1/01 Transfer ICU, PNA, DT's, VDRF  LEVEL OF CARE:  ICU PRIMARY SERVICE:  CCS CONSULTANTS:  PCCM CODE STATUS: Full DIET:  NPO DVT Px:  SQ heparin GI Px:  Protonix  PAST MEDICAL HISTORY :  Past Medical History  Diagnosis Date  . A-fib     chronic afib - did not like coumadin  . Coronary artery disease     s/p PCI  . Diverticulitis with perforation    Past Surgical History  Procedure Date  . Cardiac catheterization   . Laparotomy 09/02/2012    Procedure: EXPLORATORY LAPAROTOMY;  Surgeon: Nada Libman, MD;  Location: [facility] OR;  Service: Vascular;  Laterality: N/A;  Exploratory Laparotomy with Sigmoid resection with end colocstomy.  . Colostomy revision 09/02/2012    Procedure: COLON RESECTION SIGMOID;  Surgeon: Nada Libman, MD;  Location: [facility] OR;  Service: Vascular;  Laterality: N/A;  Sigmoid resection with End Colostomy.   Prior to Admission medications   Medication Sig Start Date End Date Taking? Authorizing  Provider  aspirin EC 325 MG tablet Take 325 mg by mouth daily.   Yes Historical Provider, MD  digoxin (LANOXIN) 0.25 MG tablet Take 0.25 mg by mouth daily.   Yes Historical Provider, MD  metoprolol succinate (TOPROL-XL) 50 MG 24 hr tablet Take 50 mg by mouth 2 (two) times daily. Take with or immediately following a meal.   Yes Historical Provider, MD  nitroGLYCERIN (NITROSTAT) 0.4 MG SL tablet Place 0.4 mg under the tongue every 5 (five) minutes as needed. x3 doses For chest pain   Yes Historical Provider, MD  prasugrel (EFFIENT) 10 MG TABS Take 10 mg by mouth daily.   Yes Historical Provider, MD   No Known Allergies  FAMILY HISTORY:  History reviewed. No pertinent family history. SOCIAL HISTORY:  reports that he has been smoking Cigarettes.  He has a 10 pack-year smoking history. He has never used smokeless tobacco. He reports that he drinks about 1.2 ounces of alcohol per week. He reports that he does not use illicit drugs.  REVIEW OF SYSTEMS:   Pt unable to provide information due to encephalopathy.   INTERVAL HISTORY:  Now intubated, sedated.  VITAL SIGNS: Temp:  [98 F (36.7 C)-101.1 F (38.4 C)] 101.1 F (38.4 C) (01/01 1709) Pulse Rate:  [85-130] 97  (01/01 1610) Resp:  [18-27] 21  (01/01 1610) BP: (112-149)/(67-117) 141/86 mmHg (01/01 1610) SpO2:  [90 %-100 %] 100 % (01/01 1610) FiO2 (%):  [50 %] 50 % (01/01 1610) HEMODYNAMICS:   VENTILATOR SETTINGS: Vent Mode:  [-]  PRVC FiO2 (%):  [50 %] 50 % Set Rate:  [16 bmp] 16 bmp Vt Set:  [620 mL] 620 mL PEEP:  [5 cmH20] 5 cmH20 Plateau Pressure:  [19 cmH20] 19 cmH20 INTAKE / OUTPUT: Intake/Output      12/31 0701 - 01/01 0700 01/01 0701 - 01/02 0700   P.O. 360    I.V. (mL/kg) 995 (12.2)    IV Piggyback 100    Total Intake(mL/kg) 1455 (17.9)    Urine (mL/kg/hr) 270 (0.1)    Stool     Total Output 270    Net +1185           PHYSICAL EXAMINATION: General: Ill appearing Neuro: Prior to intubation - confused,  agitated, combative, moves all extremities HEENT: No sinus tenderness, dry oral mucosa, no oral exudate, no LAN Cardiovascular:  s1s2 irregular, no murmur Lungs:  Diffuse rhonchi, basilar rales Rt > Lt, no wheeze Abdomen:  Wound dressing clean Musculoskeletal:  No edema Skin: warm, no rashes   LABS: Cbc  Lab 09/04/12 1200 09/03/12 0500 09/02/12 2246  WBC 9.9 -- --  HGB 12.2* 13.1 15.3  HCT 35.4* 38.3* 45.0  PLT 99* 141* --    Chemistry   Lab 09/04/12 0815 09/03/12 0500 09/02/12 2246  NA 138 134* 136  K 3.4* 3.8 5.5*  CL 102 98 --  CO2 25 22 --  BUN 9 13 --  CREATININE 0.88 0.94 --  CALCIUM 7.5* 6.9* --  MG 1.4* 0.6* --  PHOS -- 3.1 --  GLUCOSE 156* 181* --    Liver fxn No results found for this basename: AST:3,ALT:3,ALKPHOS:3,BILITOT:3,PROT:3,ALBUMIN:3 in the last 168 hours coags  Lab 09/03/12 0500  APTT 36  INR 1.64*   Sepsis markers  Lab 09/03/12 0600  LATICACIDVEN 3.6*  PROCALCITON --   ABG  Lab 09/02/12 2246  PHART 7.368  PCO2ART 42.4  PO2ART 340.0*  HCO3 24.9*  TCO2 26    CBG trend No results found for this basename: GLUCAP:5 in the last 168 hours  IMAGING: Dg Chest Port 1 View  09/06/2012  *RADIOLOGY REPORT*  Clinical Data: Evaluate ET tube placement  PORTABLE CHEST - 1 VIEW  Comparison: 09/06/2012  Findings: Left subclavian catheter tip is in the SVC.  The endotracheal tube tip is above the carina.  Normal heart size. Posterior layering right pleural effusion is similar to previous exam.  Smaller left effusion is present.  There are areas of plate- like atelectasis within the left midlung and left base.  IMPRESSION:  1.  Satisfactory position of the ET tube with tip above the carina. 2.  Left subclavian catheter tip is in the SVC without pneumothorax. 3.  Bilateral pleural effusions and bibasilar atelectasis as before.   Original Report Authenticated By: Signa Kell, M.D.    Dg Chest Port 1 View  09/06/2012  *RADIOLOGY REPORT*  Clinical Data:  Respiratory distress  PORTABLE CHEST - 1 VIEW  Comparison: None 09/05/2012  Findings: Heart size is normal.  There has been increase and bilateral pleural effusions with increasing areas of within both lower lobes and left midlung.  IMPRESSION:  1.  Small bilateral pleural effusions and increasing atelectasis.   Original Report Authenticated By: Signa Kell, M.D.    Dg Chest Port 1 View  09/05/2012  *RADIOLOGY REPORT*  Clinical Data: Status post exploratory laparotomy  PORTABLE CHEST - 1 VIEW  Comparison: 09/02/2012  Findings: Heart size is mildly enlarged.  There is asymmetric elevation of the right hemidiaphragm.  Persistent airspace opacity  within the medial right lung base.  Left lung is clear.  IMPRESSION:  1.  Persistent opacity within the medial right lung base.  Favored to represent either aspiration or pneumonia.  Continued follow-up imaging advised.   Original Report Authenticated By: Signa Kell, M.D.     ASSESSMENT / PLAN:  PULMONARY  ASSESSMENT: Acute respiratory failure 2nd to PNA in setting of alcohol withdrawal. Hx of tobacco abuse. PLAN:   Full vent support until more stable PRN BD's F/u CXR, ABG  CARDIOVASCULAR  ASSESSMENT:  SIRS with sepsis, and alcohol withdrawal. Hx of CAD, A fib. PLAN:  Monitor hemodynamics Continue IV fluid Check digoxin level Lopressor prn IV for HR > 120 F/u lactic acid, cortisol, TSH  RENAL  ASSESSMENT:   Urinary retention -- developed post-op. Hypokalemia, hypomagnesemia. PLAN:   Monitor renal fx, urine outpt, electrolytes Maintain foley Replace electrolytes as needed  GASTROINTESTINAL  ASSESSMENT:  S/p laparotomy for perforated sigmoid diverticulum. PLAN:   Post-op care per CCS Advance diet per CCS  HEMATOLOGIC  ASSESSMENT:   Anemia of critical illness. PLAN:  F/u CBC, PT/INR, PTT  INFECTIOUS  ASSESSMENT:   Peritonitis 2nd to perforated sigmoid diverticulum Hospital acquired/aspiration pneumonia. PLAN:     Change abx to vancomycin/zosyn 1/01  ENDOCRINE  ASSESSMENT:   Hyperglycemia -- no hx of DM. PLAN:   Check HbA1C SSI  NEUROLOGIC  ASSESSMENT:   Acute encephalopathy 2nd to ETOH withdrawal, hypoxia, and PNA with sepsis. PLAN:   Precedex while on vent with prn versed, fentanyl Thiamine, folic acid, dextrose in IV fluid  Updated wife over phone.  Critical care time 90 minutes.  [name], MD [facility] Pulmonary/Critical Care 09/06/2012, 5:25 PM Pager:  [phone] After 3pm call: [phone]

## 2012-09-06 NOTE — Progress Notes (Signed)
Rapid Response in and Surgical PA, Will Jennings in to see.  To be transferred to 2306. Order for portable cxr

## 2012-09-06 NOTE — Progress Notes (Signed)
Patient on CIWA protocol, continued agitation and restlessness.  He will cough when asked but does not follow commands in general.  Now with respiratory distress, RR 30-40s, Rhonchi through out, wheezey.  Non productive cough.  BP 150/98  AF 108-120s, RR 30s, O2 sats 94% on 4L/[place]  RT at bedside, resp treatment given.  Will Jennings PA at bedside, Lasix given IV.  Pt with foley cath, red tinged urine.  2 Ativan given IV with no response.  PCXR done.  Patient on Cardizem gtt at 5mg /hr, d/c'd upon arrival to SICU. Patient transported to 2306 via bed with O2 and heart monitor.  RN updated on patient status.  Dr Craige Cotta at bedside plan to intubate and place central line.

## 2012-09-06 NOTE — Progress Notes (Signed)
4 Days Post-Op  Subjective: POD#4 Developed DT's, now on protocol Foley inserted for urinary retention Still confused  Objective: Vital signs in last 24 hours: Temp:  [98.1 F (36.7 C)-99 F (37.2 C)] 98.7 F (37.1 C) (01/01 0412) Pulse Rate:  [85-130] 106  (01/01 0600) Resp:  [13-27] 26  (01/01 0600) BP: (112-179)/(67-109) 120/72 mmHg (01/01 0600) SpO2:  [90 %-97 %] 95 % (01/01 0600) Arterial Line BP: (144)/(94) 144/94 mmHg (12/31 0900) Last BM Date: 09/05/12  Intake/Output from previous day: 12/31 0701 - 01/01 0700 In: 575 [P.O.:360; I.V.:215] Out: 270 [Urine:270] Intake/Output this shift:    Able to arouse, confused Abdomen soft, ostomy pink  Lab Results:   Basename 09/04/12 1200  WBC 9.9  HGB 12.2*  HCT 35.4*  PLT 99*   BMET  Basename 09/04/12 0815  NA 138  K 3.4*  CL 102  CO2 25  GLUCOSE 156*  BUN 9  CREATININE 0.88  CALCIUM 7.5*   PT/INR No results found for this basename: LABPROT:2,INR:2 in the last 72 hours ABG No results found for this basename: PHART:2,PCO2:2,PO2:2,HCO3:2 in the last 72 hours  Studies/Results: Dg Chest Port 1 View  09/05/2012  *RADIOLOGY REPORT*  Clinical Data: Status post exploratory laparotomy  PORTABLE CHEST - 1 VIEW  Comparison: 09/02/2012  Findings: Heart size is mildly enlarged.  There is asymmetric elevation of the right hemidiaphragm.  Persistent airspace opacity within the medial right lung base.  Left lung is clear.  IMPRESSION:  1.  Persistent opacity within the medial right lung base.  Favored to represent either aspiration or pneumonia.  Continued follow-up imaging advised.   Original Report Authenticated By: Signa Kell, M.D.     Anti-infectives: Anti-infectives     Start     Dose/Rate Route Frequency Ordered Stop   09/04/12 2300   ertapenem (INVANZ) 1 g in sodium chloride 0.9 % 50 mL IVPB  Status:  Discontinued        1 g 100 mL/hr over 30 Minutes Intravenous  Once 09/03/12 0200 09/03/12 0240   09/03/12  0600   ceFAZolin (ANCEF) IVPB 1 g/50 mL premix  Status:  Discontinued     Comments: Send with pt to OR      1 g 100 mL/hr over 30 Minutes Intravenous On call 09/02/12 2140 09/02/12 2148   09/03/12 0600   ceFAZolin (ANCEF) IVPB 2 g/50 mL premix     Comments: Send with pt to OR      2 g 100 mL/hr over 30 Minutes Intravenous On call 09/02/12 2148 09/02/12 2212   09/03/12 0400   ciprofloxacin (CIPRO) IVPB 400 mg        400 mg 200 mL/hr over 60 Minutes Intravenous 2 times daily 09/03/12 0241     09/03/12 0400   metroNIDAZOLE (FLAGYL) IVPB 500 mg        500 mg 100 mL/hr over 60 Minutes Intravenous 3 times per day 09/03/12 0241     09/03/12 0045   ciprofloxacin (CIPRO) IVPB 400 mg  Status:  Discontinued        400 mg 200 mL/hr over 60 Minutes Intravenous Every 12 hours 09/03/12 0035 09/03/12 0235   09/03/12 0045   metroNIDAZOLE (FLAGYL) IVPB 500 mg  Status:  Discontinued        500 mg 100 mL/hr over 60 Minutes Intravenous Every 8 hours 09/03/12 0035 09/03/12 0235          Assessment/Plan: s/p Procedure(s) (LRB) with comments: EXPLORATORY LAPAROTOMY (N/A) - Exploratory  Laparotomy with Sigmoid resection with end colocstomy. COLON RESECTION SIGMOID (N/A) - Sigmoid resection with End Colostomy.  DT's  Continue protocol  Urinary retention  Leave foley given mental status  Restrain as needed  Continue IV antibiotics and wound care  LOS: 4 days    Damia Bobrowski A 09/06/2012

## 2012-09-06 NOTE — Progress Notes (Signed)
Pt unable to void despite several attempts. #14 fr foley inserted gently without any resistance. Pt tolerated procedure well.

## 2012-09-06 NOTE — Progress Notes (Signed)
Continues to be restless, pulling at tubes, lungs rhonchi, sitter at bedside. Color pink resp. Labored. 02 sat 92%. Rapid response notified to see. 02  4 liters by n/c. Order for Ativan not working.

## 2012-09-07 ENCOUNTER — Inpatient Hospital Stay (HOSPITAL_COMMUNITY): Payer: Managed Care, Other (non HMO)

## 2012-09-07 DIAGNOSIS — J811 Chronic pulmonary edema: Secondary | ICD-10-CM

## 2012-09-07 DIAGNOSIS — I472 Ventricular tachycardia: Secondary | ICD-10-CM | POA: Diagnosis not present

## 2012-09-07 DIAGNOSIS — J95821 Acute postprocedural respiratory failure: Secondary | ICD-10-CM | POA: Diagnosis present

## 2012-09-07 DIAGNOSIS — R601 Generalized edema: Secondary | ICD-10-CM | POA: Diagnosis not present

## 2012-09-07 DIAGNOSIS — R609 Edema, unspecified: Secondary | ICD-10-CM

## 2012-09-07 DIAGNOSIS — I4729 Other ventricular tachycardia: Secondary | ICD-10-CM

## 2012-09-07 DIAGNOSIS — D696 Thrombocytopenia, unspecified: Secondary | ICD-10-CM | POA: Diagnosis not present

## 2012-09-07 DIAGNOSIS — R509 Fever, unspecified: Secondary | ICD-10-CM

## 2012-09-07 HISTORY — DX: Other ventricular tachycardia: I47.29

## 2012-09-07 HISTORY — DX: Ventricular tachycardia: I47.2

## 2012-09-07 HISTORY — DX: Acute postprocedural respiratory failure: J95.821

## 2012-09-07 LAB — GLUCOSE, CAPILLARY
Glucose-Capillary: 111 mg/dL — ABNORMAL HIGH (ref 70–99)
Glucose-Capillary: 86 mg/dL (ref 70–99)
Glucose-Capillary: 78 mg/dL (ref 70–99)

## 2012-09-07 LAB — CBC
MCH: 35.3 pg — ABNORMAL HIGH (ref 26.0–34.0)
MCV: 101.2 fL — ABNORMAL HIGH (ref 78.0–100.0)
Platelets: 89 10*3/uL — ABNORMAL LOW (ref 150–400)
RDW: 12.8 % (ref 11.5–15.5)
WBC: 3.8 10*3/uL — ABNORMAL LOW (ref 4.0–10.5)

## 2012-09-07 LAB — BASIC METABOLIC PANEL
Calcium: 7.6 mg/dL — ABNORMAL LOW (ref 8.4–10.5)
Creatinine, Ser: 0.82 mg/dL (ref 0.50–1.35)
GFR calc Af Amer: >90 mL/min (ref >90)
Sodium: 130 mEq/L — ABNORMAL LOW (ref 135–145)

## 2012-09-07 LAB — CORTISOL: Cortisol, Plasma: 23.3 ug/dL

## 2012-09-07 LAB — HEMOGLOBIN A1C: Hgb A1c MFr Bld: 5.5 % (ref <5.7)

## 2012-09-07 MED ORDER — METOPROLOL TARTRATE 1 MG/ML IV SOLN
5.0000 mg | Freq: Four times a day (QID) | INTRAVENOUS | Status: DC
  Administered 2012-09-07 – 2012-09-10 (×12): 5 mg via INTRAVENOUS
  Filled 2012-09-07 (×17): qty 5

## 2012-09-07 MED ORDER — LORAZEPAM 2 MG/ML IJ SOLN
1.0000 mg | Freq: Two times a day (BID) | INTRAMUSCULAR | Status: DC | PRN
  Administered 2012-09-07: 1 mg via INTRAVENOUS
  Filled 2012-09-07: qty 1

## 2012-09-07 MED ORDER — DIGOXIN 0.25 MG/ML IJ SOLN
0.1250 mg | Freq: Every day | INTRAMUSCULAR | Status: DC
  Administered 2012-09-07 – 2012-09-09 (×3): 0.125 mg via INTRAVENOUS
  Filled 2012-09-07 (×4): qty 0.5

## 2012-09-07 MED ORDER — IPRATROPIUM BROMIDE 0.02 % IN SOLN
0.5000 mg | Freq: Four times a day (QID) | RESPIRATORY_TRACT | Status: DC
  Administered 2012-09-07 – 2012-09-12 (×19): 0.5 mg via RESPIRATORY_TRACT
  Filled 2012-09-07 (×20): qty 2.5

## 2012-09-07 MED ORDER — FUROSEMIDE 10 MG/ML IJ SOLN
40.0000 mg | Freq: Once | INTRAMUSCULAR | Status: AC
  Administered 2012-09-07: 40 mg via INTRAVENOUS

## 2012-09-07 MED ORDER — ALBUTEROL SULFATE (5 MG/ML) 0.5% IN NEBU
2.5000 mg | INHALATION_SOLUTION | RESPIRATORY_TRACT | Status: DC | PRN
  Administered 2012-09-10 – 2012-09-14 (×2): 2.5 mg via RESPIRATORY_TRACT
  Filled 2012-09-07: qty 0.5

## 2012-09-07 MED ORDER — LORAZEPAM 2 MG/ML IJ SOLN
0.5000 mg | INTRAMUSCULAR | Status: DC | PRN
  Administered 2012-09-07: 0.5 mg via INTRAVENOUS
  Administered 2012-09-08 – 2012-09-14 (×9): 1 mg via INTRAVENOUS
  Administered 2012-09-17: 0.5 mg via INTRAVENOUS
  Filled 2012-09-07 (×13): qty 1

## 2012-09-07 MED ORDER — METOPROLOL TARTRATE 1 MG/ML IV SOLN
2.5000 mg | INTRAVENOUS | Status: DC | PRN
  Administered 2012-09-08: 5 mg via INTRAVENOUS
  Administered 2012-09-08 (×2): 2.5 mg via INTRAVENOUS
  Filled 2012-09-07: qty 5

## 2012-09-07 MED ORDER — POTASSIUM CHLORIDE 10 MEQ/50ML IV SOLN
10.0000 meq | INTRAVENOUS | Status: AC
  Administered 2012-09-07 (×4): 10 meq via INTRAVENOUS
  Filled 2012-09-07: qty 200

## 2012-09-07 MED ORDER — FUROSEMIDE 10 MG/ML IJ SOLN
INTRAMUSCULAR | Status: AC
  Filled 2012-09-07: qty 4

## 2012-09-07 MED ORDER — FENTANYL CITRATE 0.05 MG/ML IJ SOLN
25.0000 ug | INTRAMUSCULAR | Status: DC | PRN
  Administered 2012-09-07: 50 ug via INTRAVENOUS
  Filled 2012-09-07: qty 2

## 2012-09-07 MED ORDER — ALBUTEROL SULFATE (5 MG/ML) 0.5% IN NEBU
2.5000 mg | INHALATION_SOLUTION | Freq: Four times a day (QID) | RESPIRATORY_TRACT | Status: DC
  Administered 2012-09-07 – 2012-09-12 (×19): 2.5 mg via RESPIRATORY_TRACT
  Filled 2012-09-07 (×20): qty 0.5

## 2012-09-07 NOTE — Procedures (Signed)
Extubation Procedure Note  Patient Details:   Name: Johnathan Bauer DOB: 11/01/1950 MRN: [id number]   Airway Documentation:  Airway 8 mm (Active)  Secured at (cm) 24 cm 09/07/2012  8:04 AM  Measured From Lips 09/07/2012  8:04 AM  Secured Location Center 09/07/2012  8:04 AM  Secured By [employer] 09/07/2012  8:04 AM  Tube Holder Repositioned Yes 09/07/2012  8:04 AM  Cuff Pressure (cm H2O) 24 cm H2O 09/06/2012  7:40 PM  Site Condition Dry 09/07/2012  3:34 AM    Evaluation  O2 sats: stable throughout Complications: No apparent complications Patient did tolerate procedure well. Bilateral Breath Sounds: Rhonchi;Expiratory wheezes Suctioning: Airway Yes, pt is able to phonate. Pt is not verbalizing anything specific at this time. Pt extubated to a 4lpm [place], Sp02  91%. RT will continue to monitor pt progress.   Melanee Spry 09/07/2012, 11:45 AM

## 2012-09-07 NOTE — Progress Notes (Signed)
Patient ID: Johnathan Bauer, male   DOB: 1951/07/16, 62 y.o.   MRN: [id number]  General Surgery - [facility] - Progress Note  POD# 5  Subjective: Patient in ICU, vent, sedated.  CCM managing for DT's, resp failure secondary to pneumonia.  Objective: Vital signs in last 24 hours: Temp:  [98 F (36.7 C)-102.9 F (39.4 C)] 102.9 F (39.4 C) (01/02 0729) Pulse Rate:  [54-139] 86  (01/02 0804) Resp:  [13-28] 27  (01/02 0804) BP: (83-150)/(62-108) 100/82 mmHg (01/02 0804) SpO2:  [86 %-100 %] 99 % (01/02 0804) FiO2 (%):  [39.9 %-52.4 %] 40 % (01/02 0804) Weight:  [189 lb 13.1 oz (86.1 kg)] 189 lb 13.1 oz (86.1 kg) (01/02 0200) Last BM Date: 09/05/12  Intake/Output from previous day: 01/01 0701 - 01/02 0700 In: 1994.6 [I.V.:1192.1; NG/GT:30; IV Piggyback:772.5] Out: 1365 [Urine:915; Emesis/NG output:450]  Exam: HEENT - clear, not icteric Neck - soft Chest - wheeze on right, coarse bilaterally Cor - irreg  Abd - mild distension; BS present; dressing dry and intact; stoma viable with minimal liquid output Ext - moderate edema LE's and penis  Lab Results:   [facility] 09/07/12 0415 09/06/12 1747  WBC 3.8* 7.3  HGB 11.4* 12.5*  HCT 32.7* 35.1*  PLT 89* 90*     Basename 09/07/12 0415 09/06/12 1747  NA 130* 130*  K 3.8 3.4*  CL 97 96  CO2 27 25  GLUCOSE 121* 119*  BUN 5* 4*  CREATININE 0.82 0.75  CALCIUM 7.6* 7.8*    Studies/Results: Dg Chest Port 1 View  09/07/2012  *RADIOLOGY REPORT*  Clinical Data: Ventilator, postop exploratory laparotomy.  Follow up pneumonia.  PORTABLE CHEST - 1 VIEW  Comparison: 09/06/2012.  Findings: Endotracheal tube is in satisfactory position. Nasogastric tube is followed into the stomach.  Left subclavian central line tip projects over the SVC.  Heart size stable. Bilateral perihilar air space disease with some improvement in bibasilar aeration.  Difficult to exclude tiny bilateral pleural effusions.  IMPRESSION: Bilateral  perihilar air space disease with interval improvement in bibasilar aeration.   Original Report Authenticated By: Leanna Battles, M.D.    Dg Chest Port 1 View  09/06/2012  *RADIOLOGY REPORT*  Clinical Data: Evaluate ET tube placement  PORTABLE CHEST - 1 VIEW  Comparison: 09/06/2012  Findings: Left subclavian catheter tip is in the SVC.  The endotracheal tube tip is above the carina.  Normal heart size. Posterior layering right pleural effusion is similar to previous exam.  Smaller left effusion is present.  There are areas of plate- like atelectasis within the left midlung and left base.  IMPRESSION:  1.  Satisfactory position of the ET tube with tip above the carina. 2.  Left subclavian catheter tip is in the SVC without pneumothorax. 3.  Bilateral pleural effusions and bibasilar atelectasis as before.   Original Report Authenticated By: Signa Kell, M.D.    Dg Chest Port 1 View  09/06/2012  *RADIOLOGY REPORT*  Clinical Data: Respiratory distress  PORTABLE CHEST - 1 VIEW  Comparison: None 09/05/2012  Findings: Heart size is normal.  There has been increase and bilateral pleural effusions with increasing areas of within both lower lobes and left midlung.  IMPRESSION:  1.  Small bilateral pleural effusions and increasing atelectasis.   Original Report Authenticated By: Signa Kell, M.D.     Assessment / Plan: 1.  Status post laparotomy with Hartmann's resection for perforated diverticular disease  IV Vanco and Zosyn  NPO with orogastric tube  on vent  Stoma care  Wound care  Will follow with you 2.  Delirium tremens 3.  Probable aspiration pneumonia with resp failure  Velora Heckler, MD, [facility] Office: [phone]  09/07/2012

## 2012-09-07 NOTE — Progress Notes (Signed)
PULMONARY  / CRITICAL CARE MEDICINE  Name: Johnathan Bauer MRN: [id number] DOB: 03/06/1951    LOS: 5  REFERRING MD :  Abigail Miyamoto  CHIEF COMPLAINT:  Short of breath  BRIEF PATIENT DESCRIPTION:  62 yo male smoker transferred from [place] on 09/02/2012 with abdominal pain with lactic acidosis, and found to have perforated sigmoid diverticulitis.  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  Developed respiratory distress and confusion 12/31>>transferred to SDU, placed on oxygen and CIWA started.  Developed fever from PNA, VDRF 1/01 and transferred to ICU with PCCM consult.   Significant PMHx CAD s/p stent March 2013, A fib (did not tolerate coumadin), ETOH (at least 3 to 4 beers/day)   LINES / TUBES: ETT 1/01 >> 1/02 Lt [place] CVL 1/01 >>  CULTURES: Blood 1/01 >> Sputum 1/01 >>  ANTIBIOTICS: Ancef 12/28>>12/28 Cipro 12/28>>1/01 Flagyl 12/28>>1/01 Vancomycin 1/01>> Zosyn 1/01>>  SIGNIFICANT EVENTS:  12/29 Laparotomy 1/01 Transfer ICU, PNA, DT's, VDRF  LEVEL OF CARE:  ICU PRIMARY SERVICE:  CCS CONSULTANTS:  PCCM CODE STATUS: Full DIET:  NPO DVT Px:  SQ heparin GI Px:  Protonix   INTERVAL HISTORY:  Intubated, sedated, not F/C. Passed SBT  VITAL SIGNS: Temp:  [100.1 F (37.8 C)-102.9 F (39.4 C)] 102.2 F (39 C) (01/02 1531) Pulse Rate:  [54-139] 94  (01/02 1500) Resp:  [13-27] 22  (01/02 1500) BP: (100-132)/(61-91) 102/61 mmHg (01/02 1500) SpO2:  [86 %-100 %] 95 % (01/02 1500) FiO2 (%):  [39.6 %-52.4 %] 50 % (01/02 1100) Weight:  [86.1 kg (189 lb 13.1 oz)] 86.1 kg (189 lb 13.1 oz) (01/02 0200) HEMODYNAMICS:   VENTILATOR SETTINGS: Vent Mode:  [-] CPAP FiO2 (%):  [39.6 %-52.4 %] 50 % Set Rate:  [14 bmp] 14 bmp Vt Set:  [620 mL] 620 mL PEEP:  [5 cmH20] 5 cmH20 Plateau Pressure:  [24 cmH20-25 cmH20] 24 cmH20 INTAKE / OUTPUT: Intake/Output      01/01 0701 - 01/02 0700 01/02 0701 - 01/03 0700   P.O.     I.V. (mL/kg)  1192.1 (13.8) 444.3 (5.2)   NG/GT 30 40   IV Piggyback 772.5 175   Total Intake(mL/kg) 1994.6 (23.2) 659.3 (7.7)   Urine (mL/kg/hr) 915 (0.4) 1345 (1.5)   Emesis/NG output 450 50   Total Output 1365 1395   Net +629.6 -735.7          PHYSICAL EXAMINATION: General: NAD,  Neuro: RASS - 2, not F/C, MAEs HEENT: WNL Cardiovascular:  IR, rate controlled, no M Lungs: scattered rhonchi Abdomen:  Wound dressing clean, soft, decreased BS, ostomy pink Musculoskeletal:  Severe bilat UE and bilat LE edema Skin: warm, no rashes   LABS: Cbc  Lab 09/07/12 0415 09/06/12 1747 09/04/12 1200  WBC 3.8* -- --  HGB 11.4* 12.5* 12.2*  HCT 32.7* 35.1* 35.4*  PLT 89* 90* 99*    Chemistry   Lab 09/07/12 0415 09/06/12 1747 09/04/12 0815 09/03/12 0500  NA 130* 130* 138 --  K 3.8 3.4* 3.4* --  CL 97 96 102 --  CO2 27 25 25  --  BUN 5* 4* 9 --  CREATININE 0.82 0.75 0.88 --  CALCIUM 7.6* 7.8* 7.5* --  MG -- 1.1* 1.4* 0.6*  PHOS -- 1.8* -- 3.1  GLUCOSE 121* 119* 156* --    Liver fxn  Lab 09/06/12 1747  AST 23  ALT 12  ALKPHOS 78  BILITOT 2.6*  PROT 4.8*  ALBUMIN 2.3*   coags  Lab 09/06/12 1747 09/03/12 0500  APTT 35 36  INR 1.28 1.64*   Sepsis markers  Lab 09/06/12 1748 09/03/12 0600  LATICACIDVEN 1.5 3.6*  PROCALCITON -- --   ABG  Lab 09/06/12 1725 09/02/12 2246  PHART 7.464* 7.368  PCO2ART 39.8 42.4  PO2ART 69.0* 340.0*  HCO3 28.2* 24.9*  TCO2 29 26    CBG trend  Lab 09/07/12 1529 09/07/12 1145 09/07/12 0728 09/07/12 0445 09/06/12 2343  GLUCAP 75 86 87 111* 121*    CXR: perihilar edema pattern   ASSESSMENT Respiratory failure, post-operative  Intubated for AMS and resp distress  CXR most c/w edema  Passed SBT 1/02 and extubated, tolerating well initially   Perforated sigmoid colon  S/P laparotomy and sigmoid colon resection 12/29  Post op mgmt per CCS   Delirium tremens  Markedly improved on precedex   Pulmonary edema  No prior history of CHF  documented  CXR pattern more suggestive of edema than PNA   Atrial fibrillation, chronic, rate controlled   h/o CAD  No evidence of active ischemia   NSVT - 7 beat run 1/02   Anasarca, likely due to post op volume resuscitation   Thrombocytopenia  Etiology unclear. SQ heparin noted   Fever  Abdominal source vs HCAP  PLAN: Extubated today Post extubation monitoring of respiratory status in ICU Cont precedex post extubation PRN Lorazepam ordered Cont PRN fentanyl for post op pain Cont current abx Lasix ordered. D/C IVFs  Replete K+ prophylactically D/C SQ heparin. SCDs ordered Check pro BNP AM 1/03 - consider Echo if significantly abnormal Low dose beta blocker ordered Resume outpt dose of digoxin   CCM X 45 mins   Billy Fischer, MD ; [facility] [phone].  After 5:30 PM or weekends, call [phone]

## 2012-09-08 ENCOUNTER — Inpatient Hospital Stay (HOSPITAL_COMMUNITY): Payer: Managed Care, Other (non HMO)

## 2012-09-08 DIAGNOSIS — I472 Ventricular tachycardia: Secondary | ICD-10-CM

## 2012-09-08 LAB — GLUCOSE, CAPILLARY
Glucose-Capillary: 81 mg/dL (ref 70–99)
Glucose-Capillary: 76 mg/dL (ref 70–99)
Glucose-Capillary: 78 mg/dL (ref 70–99)
Glucose-Capillary: 100 mg/dL — ABNORMAL HIGH (ref 70–99)

## 2012-09-08 LAB — BASIC METABOLIC PANEL
BUN: 11 mg/dL (ref 6–23)
Creatinine, Ser: 0.9 mg/dL (ref 0.50–1.35)
GFR calc Af Amer: >90 mL/min (ref >90)
GFR calc non Af Amer: >90 mL/min (ref >90)

## 2012-09-08 LAB — PRO B NATRIURETIC PEPTIDE: Pro B Natriuretic peptide (BNP): 4843 pg/mL — ABNORMAL HIGH (ref 0–125)

## 2012-09-08 LAB — VANCOMYCIN, TROUGH: Vancomycin Tr: 16.4 ug/mL (ref 10.0–20.0)

## 2012-09-08 LAB — CBC
MCHC: 34.3 g/dL (ref 30.0–36.0)
RDW: 12.8 % (ref 11.5–15.5)

## 2012-09-08 MED ORDER — DILTIAZEM HCL 100 MG IV SOLR
5.0000 mg/h | INTRAVENOUS | Status: DC
  Administered 2012-09-08: 10 mg/h via INTRAVENOUS
  Administered 2012-09-09: 7 mg/h via INTRAVENOUS
  Administered 2012-09-10 – 2012-09-11 (×4): 10 mg/h via INTRAVENOUS
  Filled 2012-09-08 (×3): qty 100

## 2012-09-08 MED ORDER — POTASSIUM CHLORIDE 10 MEQ/50ML IV SOLN
10.0000 meq | INTRAVENOUS | Status: AC
  Administered 2012-09-08 (×2): 10 meq via INTRAVENOUS
  Filled 2012-09-08: qty 100

## 2012-09-08 MED ORDER — DILTIAZEM LOAD VIA INFUSION
10.0000 mg | Freq: Once | INTRAVENOUS | Status: AC
  Administered 2012-09-08: 10 mg via INTRAVENOUS
  Filled 2012-09-08: qty 10

## 2012-09-08 MED ORDER — DILTIAZEM HCL 25 MG/5ML IV SOLN: 10.0000 mg | Freq: Once | INTRAVENOUS | Status: DC

## 2012-09-08 MED ORDER — FENTANYL CITRATE 0.05 MG/ML IJ SOLN
25.0000 ug | INTRAMUSCULAR | Status: DC | PRN
  Administered 2012-09-09: 50 ug via INTRAVENOUS
  Filled 2012-09-08 (×2): qty 2

## 2012-09-08 MED ORDER — PRASUGREL HCL 10 MG PO TABS
10.0000 mg | ORAL_TABLET | Freq: Every day | ORAL | Status: DC
  Administered 2012-09-08 – 2012-09-19 (×12): 10 mg via ORAL
  Filled 2012-09-08 (×13): qty 1

## 2012-09-08 MED ORDER — FUROSEMIDE 10 MG/ML IJ SOLN
40.0000 mg | Freq: Once | INTRAMUSCULAR | Status: AC
  Administered 2012-09-08: 40 mg via INTRAVENOUS
  Filled 2012-09-08: qty 4

## 2012-09-08 NOTE — Progress Notes (Signed)
Patient ID: Johnathan Bauer, male   DOB: [birth year], 62 y.o.   MRN: [id number]  General Surgery - [facility] - Progress Note  POD# 6  Subjective: Patient in ICU, extubated, sedated.  Markedly confused secondary to DT's.  CCM managing.  Objective: Vital signs in last 24 hours: Temp:  [98 F (36.7 C)-102.7 F (39.3 C)] 98 F (36.7 C) (01/03 0739) Pulse Rate:  [77-118] 95  (01/03 0700) Resp:  [13-31] 23  (01/03 0700) BP: (100-132)/(61-99) 132/89 mmHg (01/03 0600) SpO2:  [90 %-100 %] 98 % (01/03 0732) FiO2 (%):  [39.6 %-50 %] 50 % (01/02 1100) Last BM Date: 09/05/12  Intake/Output from previous day: 01/02 0701 - 01/03 0700 In: 1931.5 [I.V.:1031.5; NG/GT:40; IV Piggyback:860] Out: 2870 [Urine:2820; Emesis/NG output:50]  Exam: HEENT - clear, not icteric Neck - soft Chest - coarse bilaterally Cor - irreg @ 95 bpm Abd - moderate distension; BS present; no output at stoma LLQ; dressing dry and intact Ext - no significant edema  Lab Results:   [facility] 09/08/12 0345 09/07/12 0415  WBC 6.3 3.8*  HGB 13.0 11.4*  HCT 37.9* 32.7*  PLT 133* 89*     Basename 09/08/12 0345 09/07/12 0415  NA 133* 130*  K 4.0 3.8  CL 97 97  CO2 28 27  GLUCOSE 90 121*  BUN 11 5*  CREATININE 0.90 0.82  CALCIUM 7.9* 7.6*    Studies/Results: Dg Chest Port 1 View  09/07/2012  *RADIOLOGY REPORT*  Clinical Data: Ventilator, postop exploratory laparotomy.  Follow up pneumonia.  PORTABLE CHEST - 1 VIEW  Comparison: 09/06/2012.  Findings: Endotracheal tube is in satisfactory position. Nasogastric tube is followed into the stomach.  Left subclavian central line tip projects over the SVC.  Heart size stable. Bilateral perihilar air space disease with some improvement in bibasilar aeration.  Difficult to exclude tiny bilateral pleural effusions.  IMPRESSION: Bilateral perihilar air space disease with interval improvement in bibasilar aeration.   Original Report Authenticated By: Leanna Battles, M.D.    Dg Chest Port 1 View  09/06/2012  *RADIOLOGY REPORT*  Clinical Data: Evaluate ET tube placement  PORTABLE CHEST - 1 VIEW  Comparison: 09/06/2012  Findings: Left subclavian catheter tip is in the SVC.  The endotracheal tube tip is above the carina.  Normal heart size. Posterior layering right pleural effusion is similar to previous exam.  Smaller left effusion is present.  There are areas of plate- like atelectasis within the left midlung and left base.  IMPRESSION:  1.  Satisfactory position of the ET tube with tip above the carina. 2.  Left subclavian catheter tip is in the SVC without pneumothorax. 3.  Bilateral pleural effusions and bibasilar atelectasis as before.   Original Report Authenticated By: Signa Kell, M.D.    Dg Chest Port 1 View  09/06/2012  *RADIOLOGY REPORT*  Clinical Data: Respiratory distress  PORTABLE CHEST - 1 VIEW  Comparison: None 09/05/2012  Findings: Heart size is normal.  There has been increase and bilateral pleural effusions with increasing areas of within both lower lobes and left midlung.  IMPRESSION:  1.  Small bilateral pleural effusions and increasing atelectasis.   Original Report Authenticated By: Signa Kell, M.D.     Assessment / Plan: 1.  Status post hartmann's resection for perforated diverticular disease  IV Zosyn, Vanco  NPO  Dressing change daily to loosely closed wound 2.  Delirium tremens  Management per CCM 3.  Respiratory failure  Extubated  Per CCM  Connie Lasater  Molly Maduro, MD, [facility] Office: [phone]  09/08/2012

## 2012-09-08 NOTE — Progress Notes (Signed)
ANTIBIOTIC CONSULT NOTE - FOLLOW UP  Pharmacy Consult for vancomycin  Indication: HCAP  No Known Allergies  Patient Measurements: Height: 6' (182.9 cm) Weight: 189 lb 13.1 oz (86.1 kg) IBW/kg (Calculated) : 77.6  Adjusted Body Weight:   Vital Signs: Temp: 98.6 F (37 C) (01/03 1555) Temp src: Oral (01/03 1555) BP: 124/82 mmHg (01/03 1600) Pulse Rate: 72  (01/03 1600) Intake/Output from previous day: 01/02 0701 - 01/03 0700 In: 1931.5 [I.V.:1031.5; NG/GT:40; IV Piggyback:860] Out: 2870 [Urine:2820; Emesis/NG output:50] Intake/Output from this shift: Total I/O In: 382.9 [I.V.:228.9; IV Piggyback:154] Out: 2775 [Urine:2775]  Labs:  [facility] 09/08/12 0345 09/07/12 0415 09/06/12 1747  WBC 6.3 3.8* 7.3  HGB 13.0 11.4* 12.5*  PLT 133* 89* 90*  LABCREA -- -- --  CREATININE 0.90 0.82 0.75   Estimated Creatinine Clearance: 94.6 ml/min (by C-G formula based on Cr of 0.9).  Basename 09/08/12 1610  VANCOTROUGH 16.4  VANCOPEAK --  VANCORANDOM --  GENTTROUGH --  GENTPEAK --  GENTRANDOM --  TOBRATROUGH --  TOBRAPEAK --  TOBRARND --  AMIKACINPEAK --  AMIKACINTROU --  AMIKACIN --     Microbiology: Recent Results (from the past 720 hour(s))  MRSA PCR SCREENING     Status: Normal   Collection Time   09/03/12  3:00 AM      Component Value Range Status Comment   MRSA by PCR NEGATIVE  NEGATIVE Final   CULTURE, BLOOD (ROUTINE X 2)     Status: Normal (Preliminary result)   Collection Time   09/06/12  5:45 PM      Component Value Range Status Comment   Specimen Description BLOOD RIGHT HAND   Final    Special Requests BOTTLES DRAWN AEROBIC ONLY 10CC   Final    Culture  Setup Time 09/07/2012 01:19   Final    Culture     Final    Value:        BLOOD CULTURE RECEIVED NO GROWTH TO DATE CULTURE WILL BE HELD FOR 5 DAYS BEFORE ISSUING A FINAL NEGATIVE REPORT   Report Status PENDING   Incomplete   CULTURE, BLOOD (ROUTINE X 2)     Status: Normal (Preliminary result)   Collection  Time   09/06/12  5:46 PM      Component Value Range Status Comment   Specimen Description BLOOD RIGHT HAND   Final    Special Requests BOTTLES DRAWN AEROBIC ONLY 10CC   Final    Culture  Setup Time 09/07/2012 01:19   Final    Culture     Final    Value:        BLOOD CULTURE RECEIVED NO GROWTH TO DATE CULTURE WILL BE HELD FOR 5 DAYS BEFORE ISSUING A FINAL NEGATIVE REPORT   Report Status PENDING   Incomplete   CULTURE, RESPIRATORY     Status: Normal (Preliminary result)   Collection Time   09/07/12  5:21 PM      Component Value Range Status Comment   Specimen Description ENDOTRACHEAL   Final    Special Requests NONE   Final    Gram Stain     Final    Value: ABUNDANT WBC PRESENT,BOTH PMN AND MONONUCLEAR     NO SQUAMOUS EPITHELIAL CELLS SEEN     NO ORGANISMS SEEN   Culture PENDING   Incomplete    Report Status PENDING   Incomplete     Anti-infectives     Start     Dose/Rate Route Frequency Ordered Stop  09/06/12 1700   vancomycin (VANCOCIN) 1,250 mg in sodium chloride 0.9 % 250 mL IVPB        1,250 mg 166.7 mL/hr over 90 Minutes Intravenous Every 12 hours 09/06/12 1641     09/06/12 1700  piperacillin-tazobactam (ZOSYN) IVPB 3.375 g       3.375 g 12.5 mL/hr over 240 Minutes Intravenous 3 times per day 09/06/12 1641     09/04/12 2300   ertapenem (INVANZ) 1 g in sodium chloride 0.9 % 50 mL IVPB  Status:  Discontinued        1 g 100 mL/hr over 30 Minutes Intravenous  Once 09/03/12 0200 09/03/12 0240   09/03/12 0600   ceFAZolin (ANCEF) IVPB 1 g/50 mL premix  Status:  Discontinued     Comments: Send with pt to OR      1 g 100 mL/hr over 30 Minutes Intravenous On call 09/02/12 2140 09/02/12 2148   09/03/12 0600   ceFAZolin (ANCEF) IVPB 2 g/50 mL premix     Comments: Send with pt to OR      2 g 100 mL/hr over 30 Minutes Intravenous On call 09/02/12 2148 09/02/12 2212   09/03/12 0400   ciprofloxacin (CIPRO) IVPB 400 mg  Status:  Discontinued        400 mg 200 mL/hr over 60 Minutes  Intravenous 2 times daily 09/03/12 0241 09/06/12 1607   09/03/12 0400   metroNIDAZOLE (FLAGYL) IVPB 500 mg  Status:  Discontinued        500 mg 100 mL/hr over 60 Minutes Intravenous 3 times per day 09/03/12 0241 09/06/12 1607   09/03/12 0045   ciprofloxacin (CIPRO) IVPB 400 mg  Status:  Discontinued        400 mg 200 mL/hr over 60 Minutes Intravenous Every 12 hours 09/03/12 0035 09/03/12 0235   09/03/12 0045   metroNIDAZOLE (FLAGYL) IVPB 500 mg  Status:  Discontinued        500 mg 100 mL/hr over 60 Minutes Intravenous Every 8 hours 09/03/12 0035 09/03/12 0235          Assessment: 62 yo male with HCAP is currently on therapeutic vancomycin. Vancomycin trough was 16.4.  SCr was 0.9 today.  Goal of Therapy:  Vancomycin trough level 15-20 mcg/ml  Plan:  1) Continue vancomycin 1250mg  iv q12h 2) Monitor renal function and recheck level if renal function changes dramatically.  Khrystian Schauf, Tsz-Yin 09/08/2012,4:56 PM

## 2012-09-08 NOTE — Progress Notes (Signed)
Orthopedic Tech Progress Note Patient Details:  Johnathan Bauer [birth year] [id number]  Ortho Devices Type of Ortho Device: Abdominal binder Ortho Device/Splint Interventions: Casandra Doffing 09/08/2012, 7:12 PM

## 2012-09-08 NOTE — Progress Notes (Signed)
eLink Physician-Brief Progress Note Patient Name: Johnathan Bauer DOB: 1951-05-01 MRN: [id number]  Date of Service  09/08/2012   HPI/Events of Note  AF/RVR with rates in the 150s despite placement back on home dig and scheduled lopressor/prn lopressor.  HD stable with BP of 122/78.  Current HR range 120s to 140s.   eICU Interventions  Plan: Bolus diltazem 10 mg IV Diltazem gtt for HR control   Intervention Category Intermediate Interventions: Arrhythmia - evaluation and management  DETERDING,ELIZABETH 09/08/2012, 10:31 PM

## 2012-09-08 NOTE — Progress Notes (Signed)
PULMONARY  / CRITICAL CARE MEDICINE  Name: Johnathan Bauer MRN: [id number] DOB: 1951-04-04    LOS: 6  REFERRING MD :  Abigail Miyamoto  CHIEF COMPLAINT:  Short of breath  BRIEF PATIENT DESCRIPTION:  62 yo male smoker transferred from [place] on 09/02/2012 with abdominal pain with lactic acidosis, and found to have perforated sigmoid diverticulitis.  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  Developed respiratory distress and confusion 12/31>>transferred to SDU, placed on oxygen and CIWA started.  Developed fever from PNA, VDRF 1/01 and transferred to ICU with PCCM consult. Significant PMHx CAD s/p stent March 2013, A fib (did not tolerate coumadin), ETOH (at least 3 to 4 beers/day)   LINES / TUBES: ETT 1/01 >> 1/02 Lt [place] CVL 1/01 >>   CULTURES: Blood 1/01 >> Sputum 1/01 >>  ANTIBIOTICS: Ancef 12/28>>12/28 Cipro 12/28>>1/01 Flagyl 12/28>>1/01 Vancomycin 1/01>> Zosyn 1/01>>  SIGNIFICANT EVENTS:  12/29 Laparotomy 1/01 Transfer ICU, PNA, DT's, VDRF 1/04 Echocardiogram>>   LEVEL OF CARE:  ICU PRIMARY SERVICE:  CCS CONSULTANTS:  PCCM CODE STATUS: Full DIET:  NPO DVT Px:  SQ heparin GI Px:  Protonix   INTERVAL HISTORY:  Tolerating extubation well. Still agitated at times. Poorly oriented. No resp distress  VITAL SIGNS: Temp:  [98 F (36.7 C)-100.1 F (37.8 C)] 98.6 F (37 C) (01/03 1555) Pulse Rate:  [72-121] 104  (01/03 1700) Resp:  [20-31] 25  (01/03 1700) BP: (106-132)/(69-95) 126/82 mmHg (01/03 1700) SpO2:  [90 %-99 %] 94 % (01/03 1700) HEMODYNAMICS:   VENTILATOR SETTINGS:   INTAKE / OUTPUT: Intake/Output      01/02 0701 - 01/03 0700 01/03 0701 - 01/04 0700   I.V. (mL/kg) 1031.5 (12) 248.9 (2.9)   NG/GT 40    IV Piggyback 860 154   Total Intake(mL/kg) 1931.5 (22.4) 402.9 (4.7)   Urine (mL/kg/hr) 2820 (1.4) 2875 (3)   Emesis/NG output 50    Total Output 2870 2875   Net -938.5 -2472.1          PHYSICAL  EXAMINATION: General: NAD,  Neuro: RASS 0, + F/C, MAEs HEENT: WNL Cardiovascular:  IR, rate controlled, no M Lungs: scattered rhonchi - improved Abdomen:  Wound dressing clean, soft, decreased BS, ostomy pink Musculoskeletal:  Severe bilat UE and bilat LE edema Skin: warm, no rashes   LABS: Cbc  Lab 09/08/12 0345 09/07/12 0415 09/06/12 1747  WBC 6.3 -- --  HGB 13.0 11.4* 12.5*  HCT 37.9* 32.7* 35.1*  PLT 133* 89* 90*    Chemistry   Lab 09/08/12 0345 09/07/12 0415 09/06/12 1747 09/04/12 0815 09/03/12 0500  NA 133* 130* 130* -- --  K 4.0 3.8 3.4* -- --  CL 97 97 96 -- --  CO2 28 27 25  -- --  BUN 11 5* 4* -- --  CREATININE 0.90 0.82 0.75 -- --  CALCIUM 7.9* 7.6* 7.8* -- --  MG -- -- 1.1* 1.4* 0.6*  PHOS -- -- 1.8* -- 3.1  GLUCOSE 90 121* 119* -- --    Liver fxn  Lab 09/06/12 1747  AST 23  ALT 12  ALKPHOS 78  BILITOT 2.6*  PROT 4.8*  ALBUMIN 2.3*   coags  Lab 09/06/12 1747 09/03/12 0500  APTT 35 36  INR 1.28 1.64*   Sepsis markers  Lab 09/06/12 1748 09/03/12 0600  LATICACIDVEN 1.5 3.6*  PROCALCITON -- --   ABG  Lab 09/06/12 1725 09/02/12 2246  PHART 7.464* 7.368  PCO2ART 39.8 42.4  PO2ART 69.0* 340.0*  HCO3 28.2* 24.9*  TCO2 29 26    CBG trend  Lab 09/08/12 1558 09/08/12 1158 09/08/12 0741 09/08/12 0413 09/07/12 2315  GLUCAP 100* 78 76 81 83   ProBNP: 4843  CXR: perihilar edema pattern   ASSESSMENT Respiratory failure, post-operative  Intubated for AMS and resp distress  Tolerating extubation  CXR most c/w edema    Perforated sigmoid colon  S/P laparotomy and sigmoid colon resection 12/29  Post op mgmt per CCS   Delirium tremens  Markedly improved on precedex   Pulmonary edema  No prior history of CHF documented  CXR pattern more suggestive of edema than PNA   Atrial fibrillation, chronic, rate controlled  Elevated porBNP  h/o CAD  No evidence of active ischemia   NSVT - 7 beat run 1/02  None further   Anasarca,  likely due to post op volume resuscitation   Thrombocytopenia  Etiology unclear. SQ heparin DC'd 1/02  Improving 1/03   Fever  Abdominal source vs HCAP  PLAN: Cont to monitor in ICU  Wean precedex to off Cont PRN Lorazepam ordered Cont PRN fentanyl for post op pain Cont current abx Repeat Lasix Resume (outpt) Effient Echocardiogram ordered Cont low dose beta blocker  Cont outpt dose of digoxin   CCM X 35 mins   Billy Fischer, MD ; [facility] service Mobile [phone].  After 5:30 PM or weekends, call [phone]

## 2012-09-09 ENCOUNTER — Inpatient Hospital Stay (HOSPITAL_COMMUNITY): Payer: Managed Care, Other (non HMO)

## 2012-09-09 DIAGNOSIS — E876 Hypokalemia: Secondary | ICD-10-CM

## 2012-09-09 DIAGNOSIS — I4891 Unspecified atrial fibrillation: Secondary | ICD-10-CM

## 2012-09-09 DIAGNOSIS — I059 Rheumatic mitral valve disease, unspecified: Secondary | ICD-10-CM

## 2012-09-09 LAB — GLUCOSE, CAPILLARY: Glucose-Capillary: 100 mg/dL — ABNORMAL HIGH (ref 70–99)

## 2012-09-09 LAB — BASIC METABOLIC PANEL
BUN: 10 mg/dL (ref 6–23)
CO2: 29 mEq/L (ref 19–32)
Calcium: 7.6 mg/dL — ABNORMAL LOW (ref 8.4–10.5)
Chloride: 97 mEq/L (ref 96–112)
Creatinine, Ser: 0.74 mg/dL (ref 0.50–1.35)

## 2012-09-09 LAB — CBC
HCT: 38.5 % — ABNORMAL LOW (ref 39.0–52.0)
MCHC: 34.8 g/dL (ref 30.0–36.0)
MCV: 101 fL — ABNORMAL HIGH (ref 78.0–100.0)
Platelets: 183 10*3/uL (ref 150–400)
RDW: 13.1 % (ref 11.5–15.5)

## 2012-09-09 LAB — TROPONIN I: Troponin I: <0.3 ng/mL (ref <0.30)

## 2012-09-09 MED ORDER — NYSTATIN 100000 UNIT/GM EX CREA
TOPICAL_CREAM | Freq: Two times a day (BID) | CUTANEOUS | Status: DC
  Administered 2012-09-09 – 2012-09-18 (×19): via TOPICAL
  Filled 2012-09-09 (×3): qty 15

## 2012-09-09 MED ORDER — POTASSIUM CHLORIDE 10 MEQ/100ML IV SOLN
10.0000 meq | INTRAVENOUS | Status: AC
  Administered 2012-09-09 (×3): 10 meq via INTRAVENOUS
  Filled 2012-09-09 (×3): qty 100

## 2012-09-09 MED ORDER — HEPARIN SODIUM (PORCINE) 5000 UNIT/ML IJ SOLN
5000.0000 [IU] | Freq: Three times a day (TID) | INTRAMUSCULAR | Status: DC
  Administered 2012-09-09 – 2012-09-19 (×30): 5000 [IU] via SUBCUTANEOUS
  Filled 2012-09-09 (×34): qty 1

## 2012-09-09 NOTE — Progress Notes (Signed)
Patient ID: Johnathan Bauer, male   DOB: Dec 06, 1950, 62 y.o.   MRN: [id number]  General Surgery - [facility] - Progress Note  POD# 7  Subjective: Patient up in chair.  More alert - responds to voice - incomprehensible though.  Ostomy output noted.  Objective: Vital signs in last 24 hours: Temp:  [97.7 F (36.5 C)-98.7 F (37.1 C)] 97.7 F (36.5 C) (01/04 0324) Pulse Rate:  [28-139] 28  (01/04 0700) Resp:  [20-31] 28  (01/04 0700) BP: (106-132)/(54-98) 130/81 mmHg (01/04 0700) SpO2:  [84 %-99 %] 91 % (01/04 0700) Weight:  [180 lb 8.9 oz (81.9 kg)] 180 lb 8.9 oz (81.9 kg) (01/04 0600) Last BM Date: 09/05/12  Intake/Output from previous day: 01/03 0701 - 01/04 0700 In: 1367.9 [I.V.:578.9; IV Piggyback:789] Out: 4098 [Urine:3445; Stool:650]  Exam: HEENT - clear, not icteric Neck - soft Chest - coarse with wheezes bilaterally Cor - irreg @ 120 bpm Abd - softer, mild distension; dressing dry; BS present; ostomy output large, green per nursing Ext - no significant edema  Lab Results:   [facility] 09/09/12 0446 09/08/12 0345  WBC 9.7 6.3  HGB 13.4 13.0  HCT 38.5* 37.9*  PLT 183 133*     Basename 09/09/12 0446 09/08/12 0345  NA 135 133*  K 3.3* 4.0  CL 97 97  CO2 29 28  GLUCOSE 117* 90  BUN 10 11  CREATININE 0.74 0.90  CALCIUM 7.6* 7.9*    Studies/Results: Dg Chest Port 1 View  09/09/2012  *RADIOLOGY REPORT*  Clinical Data: Respiratory failure, follow-up  PORTABLE CHEST - 1 VIEW  Comparison: Portable exam 0506 hours compared to 09/08/2012  Findings: Normal heart size, mediastinal contours, and pulmonary vascularity. Left subclavian line stable, tip projecting over SVC. Atherosclerotic calcification aorta. Bilateral perihilar to basilar infiltrates little changed. No pleural effusion or pneumothorax.  IMPRESSION: No change in mild bilateral pulmonary infiltrates.   Original Report Authenticated By: Ulyses Southward, M.D.    Dg Chest Port 1 View  09/08/2012   *RADIOLOGY REPORT*  Clinical Data: Respiratory failure.  Pulmonary edema.  PORTABLE CHEST - 1 VIEW  Comparison: Chest x-ray 09/07/2012.  Findings: Lung volumes remain low.  Persistent bilateral perihilar opacities, right greater than left.  Lungs otherwise appear relatively clear.  No cephalization of the pulmonary vasculature. No definite pleural effusions.  Heart size is upper limits of normal.  Mediastinal contours are unremarkable. There is a left- sided subclavian central venous catheter with tip terminating in the proximal superior vena cava. Previously noted endotracheal and nasogastric tubes have been removed.  IMPRESSION: 1.  Support apparatus, as above. 2.  Persistent perihilar airspace opacities again noted.  Given the lack of cephalization of the pulmonary vasculature, findings are not favored to represent pulmonary edema, and clinical correlation for signs and symptoms of multilobar infection is recommended.   Original Report Authenticated By: Trudie Reed, M.D.     Assessment / Plan: 1.  Status post hartmann resection for diverticular disease  IV Zosyn, Vanco  Begin clear liquid diet as ileus resolves  Stoma, wound care 2.  Resp failure  Per CCM 3.  Atrial fib with RVR  Per CCM  On cardizem drip 4.  Hypokalemia  3 runs of KCl  Repeat labs in AM 5.  Delirium tremens  Controlled at present  Patient to remain in ICU today.  Discussed with nurse at bedside.  Velora Heckler, MD, [facility] Office: [phone]  09/09/2012

## 2012-09-09 NOTE — Progress Notes (Signed)
  Echocardiogram 2D Echocardiogram has been performed.  Johnathan Bauer 09/09/2012, 9:18 AM

## 2012-09-09 NOTE — Progress Notes (Signed)
PULMONARY  / CRITICAL CARE MEDICINE  Name: Johnathan Bauer MRN: [id number] DOB: Nov 19, 1950    LOS: 7  REFERRING MD :  Abigail Miyamoto  CHIEF COMPLAINT:  Short of breath  BRIEF PATIENT DESCRIPTION:  62 yo male smoker transferred from [place] on 09/02/2012 with abdominal pain with lactic acidosis, and found to have perforated sigmoid diverticulitis.  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  Developed respiratory distress and confusion 12/31>>transferred to SDU, placed on oxygen and CIWA started.  Developed fever from PNA, VDRF 1/01 and transferred to ICU with PCCM consult. Significant PMHx CAD s/p stent March 2013, A fib (did not tolerate coumadin), ETOH (at least 3 to 4 beers/day)   LINES / TUBES: ETT 1/01 >> 1/02 Lt [place] CVL 1/01 >>   CULTURES: Blood 1/01 >> Sputum 1/01 >>  ANTIBIOTICS: Ancef 12/28>>12/28 Cipro 12/28>>1/01 Flagyl 12/28>>1/01 Vancomycin 1/01>> Zosyn 1/01>>  SIGNIFICANT EVENTS:  12/29 Laparotomy 1/01 Transfer ICU, PNA, DT's, VDRF 1/04 A fib with RVR >> add cardizem gtt 1/04 Echocardiogram>>   LEVEL OF CARE:  SDU PRIMARY SERVICE:  CCS CONSULTANTS:  PCCM CODE STATUS: Full DIET:  Clear liquid DVT Px:  SQ heparin GI Px:  Protonix   INTERVAL HISTORY:  Still has cough.  Feels weak.  Denies chest pain.  VITAL SIGNS: Temp:  [97.5 F (36.4 C)-98.7 F (37.1 C)] 97.5 F (36.4 C) (01/04 0807) Pulse Rate:  [28-139] 89  (01/04 0900) Resp:  [20-31] 27  (01/04 0900) BP: (106-136)/(54-98) 136/86 mmHg (01/04 0900) SpO2:  [84 %-99 %] 98 % (01/04 0900) Weight:  [180 lb 8.9 oz (81.9 kg)] 180 lb 8.9 oz (81.9 kg) (01/04 0600) INTAKE / OUTPUT: Intake/Output      01/03 0701 - 01/04 0700 01/04 0701 - 01/05 0700   I.V. (mL/kg) 578.9 (7.1) 34 (0.4)   NG/GT     IV Piggyback 789 100   Total Intake(mL/kg) 1367.9 (16.7) 134 (1.6)   Urine (mL/kg/hr) 3445 (1.8) 160 (0.5)   Emesis/NG output     Stool 650    Total Output 4095 160     Net -2727.1 -26          PHYSICAL EXAMINATION: General: NAD,  Neuro: RASS 0, + F/C, MAEs HEENT: WNL Cardiovascular:  IR, rate controlled, no M Lungs: scattered rhonchi - improved Abdomen:  Wound dressing clean, soft, decreased BS, ostomy pink Musculoskeletal:  Severe bilat UE and bilat LE edema Skin: warm, no rashes   LABS: Cbc  Lab 09/09/12 0446 09/08/12 0345 09/07/12 0415  WBC 9.7 -- --  HGB 13.4 13.0 11.4*  HCT 38.5* 37.9* 32.7*  PLT 183 133* 89*    Chemistry   Lab 09/09/12 0446 09/08/12 0345 09/07/12 0415 09/06/12 1747 09/04/12 0815 09/03/12 0500  NA 135 133* 130* -- -- --  K 3.3* 4.0 3.8 -- -- --  CL 97 97 97 -- -- --  CO2 29 28 27  -- -- --  BUN 10 11 5* -- -- --  CREATININE 0.74 0.90 0.82 -- -- --  CALCIUM 7.6* 7.9* 7.6* -- -- --  MG -- -- -- 1.1* 1.4* 0.6*  PHOS -- -- -- 1.8* -- 3.1  GLUCOSE 117* 90 121* -- -- --    Liver fxn  Lab 09/06/12 1747  AST 23  ALT 12  ALKPHOS 78  BILITOT 2.6*  PROT 4.8*  ALBUMIN 2.3*   coags  Lab 09/06/12 1747 09/03/12 0500  APTT 35 36  INR 1.28 1.64*  Sepsis markers  Lab 09/06/12 1748 09/03/12 0600  LATICACIDVEN 1.5 3.6*  PROCALCITON -- --   ABG  Lab 09/06/12 1725 09/02/12 2246  PHART 7.464* 7.368  PCO2ART 39.8 42.4  PO2ART 69.0* 340.0*  HCO3 28.2* 24.9*  TCO2 29 26    CBG trend  Lab 09/09/12 0809 09/09/12 0322 09/08/12 2323 09/08/12 1928 09/08/12 1558  GLUCAP 96 106* 94 92 100*     CXR:  Dg Chest Port 1 View  09/09/2012  *RADIOLOGY REPORT*  Clinical Data: Respiratory failure, follow-up  PORTABLE CHEST - 1 VIEW  Comparison: Portable exam 0506 hours compared to 09/08/2012  Findings: Normal heart size, mediastinal contours, and pulmonary vascularity. Left subclavian line stable, tip projecting over SVC. Atherosclerotic calcification aorta. Bilateral perihilar to basilar infiltrates little changed. No pleural effusion or pneumothorax.  IMPRESSION: No change in mild bilateral pulmonary infiltrates.    Original Report Authenticated By: Ulyses Southward, M.D.    Dg Chest Port 1 View  09/08/2012  *RADIOLOGY REPORT*  Clinical Data: Respiratory failure.  Pulmonary edema.  PORTABLE CHEST - 1 VIEW  Comparison: Chest x-ray 09/07/2012.  Findings: Lung volumes remain low.  Persistent bilateral perihilar opacities, right greater than left.  Lungs otherwise appear relatively clear.  No cephalization of the pulmonary vasculature. No definite pleural effusions.  Heart size is upper limits of normal.  Mediastinal contours are unremarkable. There is a left- sided subclavian central venous catheter with tip terminating in the proximal superior vena cava. Previously noted endotracheal and nasogastric tubes have been removed.  IMPRESSION: 1.  Support apparatus, as above. 2.  Persistent perihilar airspace opacities again noted.  Given the lack of cephalization of the pulmonary vasculature, findings are not favored to represent pulmonary edema, and clinical correlation for signs and symptoms of multilobar infection is recommended.   Original Report Authenticated By: Trudie Reed, M.D.       ASSESSMENT Respiratory failure, post-operative 2nd to PNA and Pulmonary edema  Continue vancomycin/zosyn     Perforated sigmoid colon  S/P laparotomy and sigmoid colon resection 12/29  Post op mgmt per CCS   Delirium tremens  PRN ativan for CIWA > 8   Pulmonary edema  No prior history of CHF documented  CXR pattern more suggestive of edema than PNA  F/u Echo   Atrial fibrillation, chronic, rate controlled  Elevated porBNP  h/o CAD  No evidence of active ischemia  Continue cardizem gtt for now   NSVT - 7 beat run 1/02  None further   Anasarca, likely due to post op volume resuscitation   Thrombocytopenia  Likely from sepsis, ETOH.  Resume SQ heparin 1/04    Hypokalemia  F/u and replace as needed  OOB to chair.  PT consult. Transfer to SDU.  [name], MD [facility] Pulmonary/Critical Care 09/09/2012, 11:19  AM Pager:  [phone] After 3pm call: [phone]

## 2012-09-10 LAB — CBC
HCT: 36.4 % — ABNORMAL LOW (ref 39.0–52.0)
Hemoglobin: 12.8 g/dL — ABNORMAL LOW (ref 13.0–17.0)
MCV: 98.4 fL (ref 78.0–100.0)
RBC: 3.7 MIL/uL — ABNORMAL LOW (ref 4.22–5.81)
WBC: 13.7 10*3/uL — ABNORMAL HIGH (ref 4.0–10.5)

## 2012-09-10 LAB — BASIC METABOLIC PANEL
BUN: 6 mg/dL (ref 6–23)
CO2: 31 mEq/L (ref 19–32)
Chloride: 95 mEq/L — ABNORMAL LOW (ref 96–112)
Creatinine, Ser: 0.7 mg/dL (ref 0.50–1.35)
Glucose, Bld: 96 mg/dL (ref 70–99)

## 2012-09-10 LAB — GLUCOSE, CAPILLARY: Glucose-Capillary: 62 mg/dL — ABNORMAL LOW (ref 70–99)

## 2012-09-10 LAB — CULTURE, RESPIRATORY W GRAM STAIN

## 2012-09-10 MED ORDER — POTASSIUM CHLORIDE CRYS ER 20 MEQ PO TBCR
40.0000 meq | EXTENDED_RELEASE_TABLET | Freq: Once | ORAL | Status: AC
  Administered 2012-09-10: 40 meq via ORAL
  Filled 2012-09-10: qty 2

## 2012-09-10 MED ORDER — VITAMIN B-1 100 MG PO TABS
100.0000 mg | ORAL_TABLET | Freq: Every day | ORAL | Status: DC
  Administered 2012-09-10 – 2012-09-19 (×10): 100 mg via ORAL
  Filled 2012-09-10 (×10): qty 1

## 2012-09-10 MED ORDER — METOPROLOL TARTRATE 50 MG PO TABS
50.0000 mg | ORAL_TABLET | Freq: Two times a day (BID) | ORAL | Status: DC
  Filled 2012-09-10 (×2): qty 1

## 2012-09-10 MED ORDER — METOPROLOL TARTRATE 50 MG PO TABS: 50.0000 mg | ORAL_TABLET | Freq: Two times a day (BID) | ORAL | Status: DC

## 2012-09-10 MED ORDER — METOPROLOL TARTRATE 50 MG PO TABS
50.0000 mg | ORAL_TABLET | Freq: Two times a day (BID) | ORAL | Status: DC
  Administered 2012-09-10 – 2012-09-19 (×18): 50 mg via ORAL
  Filled 2012-09-10 (×21): qty 1

## 2012-09-10 MED ORDER — HYDROCODONE-ACETAMINOPHEN 5-325 MG PO TABS
1.0000 | ORAL_TABLET | ORAL | Status: DC | PRN
  Administered 2012-09-10: 1 via ORAL
  Administered 2012-09-11: 2 via ORAL
  Administered 2012-09-12: 1 via ORAL
  Filled 2012-09-10 (×2): qty 2
  Filled 2012-09-10 (×2): qty 1

## 2012-09-10 MED ORDER — PANTOPRAZOLE SODIUM 40 MG PO TBEC
40.0000 mg | DELAYED_RELEASE_TABLET | Freq: Every day | ORAL | Status: DC
  Administered 2012-09-10 – 2012-09-19 (×10): 40 mg via ORAL
  Filled 2012-09-10 (×10): qty 1

## 2012-09-10 MED ORDER — SODIUM CHLORIDE 0.9 % IV SOLN
INTRAVENOUS | Status: DC | PRN
  Administered 2012-09-10: 19:00:00 via INTRAVENOUS

## 2012-09-10 MED ORDER — ACETAMINOPHEN 325 MG PO TABS: 650.0000 mg | ORAL_TABLET | ORAL | Status: DC | PRN

## 2012-09-10 MED ORDER — FOLIC ACID 1 MG PO TABS
1.0000 mg | ORAL_TABLET | Freq: Every day | ORAL | Status: DC
  Administered 2012-09-10 – 2012-09-19 (×10): 1 mg via ORAL
  Filled 2012-09-10 (×10): qty 1

## 2012-09-10 MED ORDER — KETOROLAC TROMETHAMINE 15 MG/ML IJ SOLN
15.0000 mg | Freq: Four times a day (QID) | INTRAMUSCULAR | Status: DC | PRN
  Administered 2012-09-10 (×2): 15 mg via INTRAVENOUS
  Filled 2012-09-10 (×2): qty 1

## 2012-09-10 MED ORDER — DIGOXIN 250 MCG PO TABS
0.2500 mg | ORAL_TABLET | Freq: Every day | ORAL | Status: DC
  Administered 2012-09-10 – 2012-09-11 (×2): 0.25 mg via ORAL
  Filled 2012-09-10 (×3): qty 1

## 2012-09-10 NOTE — Progress Notes (Signed)
PULMONARY  / CRITICAL CARE MEDICINE  Name: Johnathan Bauer MRN: [id number] DOB: 1951-06-27    LOS: 8  REFERRING MD :  Abigail Miyamoto  CHIEF COMPLAINT:  Short of breath  BRIEF PATIENT DESCRIPTION:  62 yo male smoker transferred from [place] on 09/02/2012 with abdominal pain with lactic acidosis, and found to have perforated sigmoid diverticulitis.  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  Developed respiratory distress and confusion 12/31>>transferred to SDU, placed on oxygen and CIWA started.  Developed fever from PNA, VDRF 1/01 and transferred to ICU with PCCM consult. Significant PMHx CAD s/p stent March 2013, A fib (did not tolerate coumadin), ETOH (at least 3 to 4 beers/day)   LINES / TUBES: ETT 1/01 >> 1/02 Lt [place] CVL 1/01 >>   CULTURES: Blood 1/01 >> Sputum 1/01 >>  ANTIBIOTICS: Ancef 12/28>>12/28 Cipro 12/28>>1/01 Flagyl 12/28>>1/01 Vancomycin 1/01>> Zosyn 1/01>>  SIGNIFICANT EVENTS:  12/29 Laparotomy 1/01 Transfer ICU, PNA, DT's, VDRF 1/04 A fib with RVR >> add cardizem gtt 1/04 Echocardiogram>> mild LVH, EF 60 to 65%, mod Mitral prolapse, mild MR, mod LA dilation, PAS 34 mmhg 1/04 Cardizem gtt for a fib RVR  LEVEL OF CARE:  Telemetry PRIMARY SERVICE:  CCS CONSULTANTS:  PCCM CODE STATUS: Full DIET:  Clear liquid DVT Px:  SQ heparin GI Px:  Protonix   INTERVAL HISTORY:  Feels hungry.  Denies chest pain.  VITAL SIGNS: Temp:  [98.1 F (36.7 C)-98.5 F (36.9 C)] 98.1 F (36.7 C) (01/05 0810) Pulse Rate:  [52-119] 83  (01/05 0700) Resp:  [17-30] 18  (01/05 0700) BP: (112-174)/(45-155) 151/86 mmHg (01/05 0600) SpO2:  [83 %-100 %] 100 % (01/05 0700) Weight:  [180 lb 1.9 oz (81.7 kg)] 180 lb 1.9 oz (81.7 kg) (01/05 0700) INTAKE / OUTPUT: Intake/Output      01/04 0701 - 01/05 0700 01/05 0701 - 01/06 0700   P.O. 620    I.V. (mL/kg) 526 (6.4)    IV Piggyback 1035    Total Intake(mL/kg) 2181 (26.7)    Urine  (mL/kg/hr) 1130 (0.6)    Stool 250    Total Output 1380    Net +801           PHYSICAL EXAMINATION: General: NAD Neuro: Intermittently confused, follows commands HEENT: WNL Cardiovascular:  irregular Lungs: scattered rhonchi - improved Abdomen:  Wound dressing clean, soft, decreased BS, ostomy pink Musculoskeletal:  Severe bilat UE and bilat LE edema Skin: warm, no rashes   LABS: Cbc  Lab 09/10/12 0425 09/09/12 0446 09/08/12 0345  WBC 13.7* -- --  HGB 12.8* 13.4 13.0  HCT 36.4* 38.5* 37.9*  PLT 216 183 133*    Chemistry   Lab 09/10/12 0425 09/09/12 0446 09/08/12 0345 09/06/12 1747 09/04/12 0815  NA 133* 135 133* -- --  K 3.0* 3.3* 4.0 -- --  CL 95* 97 97 -- --  CO2 31 29 28  -- --  BUN 6 10 11  -- --  CREATININE 0.70 0.74 0.90 -- --  CALCIUM 8.0* 7.6* 7.9* -- --  MG -- -- -- 1.1* 1.4*  PHOS -- -- -- 1.8* --  GLUCOSE 96 117* 90 -- --    Liver fxn  Lab 09/06/12 1747  AST 23  ALT 12  ALKPHOS 78  BILITOT 2.6*  PROT 4.8*  ALBUMIN 2.3*   coags  Lab 09/06/12 1747  APTT 35  INR 1.28   Sepsis markers  Lab 09/06/12 1748  LATICACIDVEN 1.5  PROCALCITON --  ABG  Lab 09/06/12 1725  PHART 7.464*  PCO2ART 39.8  PO2ART 69.0*  HCO3 28.2*  TCO2 29    CBG trend  Lab 09/09/12 1959 09/09/12 1543 09/09/12 1145 09/09/12 0809 09/09/12 0322  GLUCAP 100* 169* 102* 96 106*     CXR:  Dg Chest Port 1 View  09/09/2012  *RADIOLOGY REPORT*  Clinical Data: Respiratory failure, follow-up  PORTABLE CHEST - 1 VIEW  Comparison: Portable exam 0506 hours compared to 09/08/2012  Findings: Normal heart size, mediastinal contours, and pulmonary vascularity. Left subclavian line stable, tip projecting over SVC. Atherosclerotic calcification aorta. Bilateral perihilar to basilar infiltrates little changed. No pleural effusion or pneumothorax.  IMPRESSION: No change in mild bilateral pulmonary infiltrates.   Original Report Authenticated By: Ulyses Southward, M.D.        ASSESSMENT Respiratory failure, post-operative 2nd to PNA and Pulmonary edema  Continue vancomycin/zosyn     Perforated sigmoid colon  S/P laparotomy and sigmoid colon resection 12/29  Post op mgmt per CCS   Delirium tremens  PRN ativan for CIWA > 8   Pulmonary edema  No prior history of CHF documented  CXR pattern more suggestive of edema than PNA   Atrial fibrillation with RVR  Mitral valve prolapse  h/o CAD  Continue digoxin   Restart oral lopressor  Wean off cardizem gtt as tolerated   NSVT - 7 beat run 1/02  None further   Anasarca, likely due to post op volume resuscitation   Thrombocytopenia >> resolved  Likely from sepsis, ETOH.  Resume SQ heparin 1/04    Hypokalemia  F/u and replace as needed  OOB to chair.  PT consulted. D/c Foley.  Transfer to tele once off cardizem gtt.  [name], MD [facility] Pulmonary/Critical Care 09/10/2012, 8:23 AM Pager:  [phone] After 3pm call: [phone]

## 2012-09-10 NOTE — Progress Notes (Signed)
Patient ID: Johnathan Bauer, male   DOB: [birth year], 62 y.o.   MRN: [id number]  General Surgery - [facility] - Progress Note  POD# 7  Subjective: Patient ambulating with physical therapy.  Confused.  Mild pain.  Tolerating clear liquid diet.  Objective: Vital signs in last 24 hours: Temp:  [98.1 F (36.7 C)-98.5 F (36.9 C)] 98.1 F (36.7 C) (01/05 0810) Pulse Rate:  [52-119] 83  (01/05 0700) Resp:  [17-30] 18  (01/05 0700) BP: (112-174)/(45-155) 151/86 mmHg (01/05 0600) SpO2:  [83 %-100 %] 100 % (01/05 0700) Weight:  [180 lb 1.9 oz (81.7 kg)] 180 lb 1.9 oz (81.7 kg) (01/05 0700) Last BM Date: 09/09/12  Intake/Output from previous day: 01/04 0701 - 01/05 0700 In: 2181 [P.O.:620; I.V.:526; IV Piggyback:1035] Out: 1380 [Urine:1130; Stool:250]  Exam: HEENT - clear, not icteric Neck - soft Chest - coarse bilaterally Cor - irreg  Abd - dressing intact; BS present; small liquid from stoma Ext - no significant edema  Lab Results:   Basename 09/10/12 0425 09/09/12 0446  WBC 13.7* 9.7  HGB 12.8* 13.4  HCT 36.4* 38.5*  PLT 216 183     Basename 09/10/12 0425 09/09/12 0446  NA 133* 135  K 3.0* 3.3*  CL 95* 97  CO2 31 29  GLUCOSE 96 117*  BUN 6 10  CREATININE 0.70 0.74  CALCIUM 8.0* 7.6*    Studies/Results: Dg Chest Port 1 View  09/09/2012  *RADIOLOGY REPORT*  Clinical Data: Respiratory failure, follow-up  PORTABLE CHEST - 1 VIEW  Comparison: Portable exam 0506 hours compared to 09/08/2012  Findings: Normal heart size, mediastinal contours, and pulmonary vascularity. Left subclavian line stable, tip projecting over SVC. Atherosclerotic calcification aorta. Bilateral perihilar to basilar infiltrates little changed. No pleural effusion or pneumothorax.  IMPRESSION: No change in mild bilateral pulmonary infiltrates.   Original Report Authenticated By: Ulyses Southward, M.D.     Assessment / Plan: 1. Status post hartmann resection for diverticular disease   IV  Zosyn, Vanco   Clear liquid diet as ileus resolves   Stoma, wound care  2. Resp failure   Per CCM  3. Atrial fib with RVR   Per CCM   Begin oral cardizem 5. Delirium tremens   Controlled at present   Patient to transfer to stepdown unit today. Discussed with nurse at bedside.  Velora Heckler, MD, [facility] Office: [phone]  09/10/2012

## 2012-09-10 NOTE — Progress Notes (Signed)
Pt voided 150 after foley dc'd bladder scan 87 cc

## 2012-09-10 NOTE — Progress Notes (Signed)
Pt tx from 2300, pt oriented to room ,call bell given, nursing introduced to pt

## 2012-09-10 NOTE — Progress Notes (Signed)
Notified Dr Corliss Skains the patient pulled his central line out, attempting to get out of bed.  Will continue to monitor. Dorie Rank

## 2012-09-10 NOTE — Evaluation (Signed)
Physical Therapy Evaluation Patient Details Name: Johnathan Bauer MRN: [id number] DOB: 1951-06-21 Today's Date: 09/10/2012 Time: 3086-5784 PT Time Calculation (min): 24 min  PT Assessment / Plan / Recommendation Clinical Impression  Pt s/p laparotomy with complications including VDRF. Pt currently presenting with decreased safety and balance deficits as well as generalized deconditioning. Pt will benefit from skilled PT in the acute care setting in order to maximize functional mobility and safety. Pending progress, pt may need additional rehab prior to d/c home safely    PT Assessment  Patient needs continued PT services    Follow Up Recommendations  Home health PT;Supervision/Assistance - 24 hour (HH vs SNF pending progress)    Does the patient have the potential to tolerate intense rehabilitation      Barriers to Discharge        Equipment Recommendations  Rolling Johnathan with 5" wheels    Recommendations for Other Services     Frequency Min 3X/week    Precautions / Restrictions Precautions Precautions: Fall Restrictions Weight Bearing Restrictions: No   Pertinent Vitals/Pain No complaints of pain this session.       Mobility  Bed Mobility Bed Mobility: Not assessed Transfers Transfers: Sit to Stand;Stand to Sit Sit to Stand: 3: Mod assist;With upper extremity assist;From chair/3-in-1 Stand to Sit: 3: Mod assist;With upper extremity assist;To chair/3-in-1 Details for Transfer Assistance: Mod assist for support. Cues for safe hand placement as pt reaching for WC. Ambulation/Gait Ambulation/Gait Assistance: 3: Mod assist Ambulation Distance (Feet): 100 Feet Assistive device: Other (Comment) (WC as RW) Ambulation/Gait Assistance Details: VC for slow pace as pt impulsive with increased unsteadiness. Multiple standing rest breaks needed seconary to dyspnea. Chair follow for safety Gait Pattern: Step-to pattern;Decreased step length - right;Narrow base of support;Trunk  flexed Gait velocity: slow gait speed    Shoulder Instructions     Exercises General Exercises - Lower Extremity Long Arc Quad: AROM;Strengthening;Both;10 reps;Seated Hip Flexion/Marching: AROM;Strengthening;Both;10 reps;Seated   PT Diagnosis: Difficulty walking  PT Problem List: Decreased activity tolerance;Decreased mobility;Decreased balance;Decreased knowledge of use of DME;Decreased safety awareness;Decreased knowledge of precautions;Cardiopulmonary status limiting activity PT Treatment Interventions: DME instruction;Gait training;Functional mobility training;Therapeutic activities;Therapeutic exercise;Balance training;Patient/family education   PT Goals Acute Rehab PT Goals PT Goal Formulation: With patient Time For Goal Achievement: 09/24/12 Potential to Achieve Goals: Good Pt will go Sit to Stand: with supervision PT Goal: Sit to Stand - Progress: Goal set today Pt will go Stand to Sit: with supervision PT Goal: Stand to Sit - Progress: Goal set today Pt will Transfer Bed to Chair/Chair to Bed: with supervision PT Transfer Goal: Bed to Chair/Chair to Bed - Progress: Goal set today Pt will Ambulate: >150 feet;with supervision;with least restrictive assistive device;Other (comment) (Dyspnea <2/5) PT Goal: Ambulate - Progress: Goal set today  Visit Information  Last PT Received On: 09/10/12 Assistance Needed: +2 (for safety)    Subjective Data      Prior Functioning  Home Living Lives With: Spouse Available Help at Discharge: Family;Other (Comment) (wife works during the day) Type of Home: House Home Access: Stairs to enter [employer] of Steps: 1 Entrance Stairs-Rails: None Home Layout: One level Bathroom Shower/Tub: Walk-in shower;Door Teacher, early years/pre: Yes How Accessible: Accessible via Johnathan Home Adaptive Equipment: None Prior Function Level of Independence: Independent Able to Take Stairs?: Yes Driving:  Yes 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, adult Communication: No difficulties Dominant Hand: Right    Cognition  Overall Cognitive Status: Impaired Area of Impairment: Safety/judgement;Following commands  Arousal/Alertness: Awake/alert Orientation Level: Appears intact for tasks assessed Behavior During Session: [facility] for tasks performed Following Commands: Follows one step commands consistently;Follows multi-step commands inconsistently;Follows multi-step commands with increased time Safety/Judgement: Decreased awareness of safety precautions;Impulsive;Decreased awareness of need for assistance;Decreased safety judgement for tasks assessed    Extremity/Trunk Assessment Right Lower Extremity Assessment RLE ROM/Strength/Tone: Within functional levels RLE Sensation: WFL - Light Touch Left Lower Extremity Assessment LLE ROM/Strength/Tone: Within functional levels LLE Sensation: WFL - Light Touch   Balance    End of Session PT - End of Session Equipment Utilized During Treatment: Gait belt;Oxygen Activity Tolerance: Patient limited by fatigue Patient left: in chair;with call bell/phone within reach;with nursing in room;with restraints reapplied Nurse Communication: Mobility status  GP     Milana Kidney 09/10/2012, 9:28 AM  09/10/2012 Milana Kidney DPT PAGER: [phone] OFFICE: [phone]

## 2012-09-11 ENCOUNTER — Encounter (HOSPITAL_COMMUNITY): Payer: Self-pay | Admitting: Cardiology

## 2012-09-11 ENCOUNTER — Inpatient Hospital Stay (HOSPITAL_COMMUNITY): Payer: Managed Care, Other (non HMO)

## 2012-09-11 LAB — CBC
HCT: 36.7 % — ABNORMAL LOW (ref 39.0–52.0)
MCHC: 35.1 g/dL (ref 30.0–36.0)
MCV: 99.2 fL (ref 78.0–100.0)
RDW: 13.5 % (ref 11.5–15.5)

## 2012-09-11 LAB — BASIC METABOLIC PANEL
BUN: 8 mg/dL (ref 6–23)
CO2: 29 mEq/L (ref 19–32)
Chloride: 96 mEq/L (ref 96–112)
Creatinine, Ser: 1.15 mg/dL (ref 0.50–1.35)
GFR calc Af Amer: 78 mL/min — ABNORMAL LOW (ref >90)

## 2012-09-11 MED ORDER — DILTIAZEM HCL 100 MG IV SOLR
5.0000 mg/h | INTRAVENOUS | Status: DC
  Administered 2012-09-11: 10 mg/h via INTRAVENOUS
  Filled 2012-09-11 (×2): qty 100

## 2012-09-11 MED ORDER — SODIUM CHLORIDE 0.9 % IV SOLN
INTRAVENOUS | Status: DC | PRN
  Administered 2012-09-11: 75 mL/h via INTRAVENOUS

## 2012-09-11 NOTE — Progress Notes (Signed)
Patient suddenly attempting  To get out of the bed,   Is slightly confused feels he can drive home to [place]. Have placed the patient in a waist restraint.

## 2012-09-11 NOTE — Evaluation (Signed)
Clinical/Bedside Swallow Evaluation Patient Details  Name: Johnathan Bauer MRN: [id number] Date of Birth: August 28, 1951  Today's Date: 09/11/2012 Time: 1210-1220 SLP Time Calculation (min): 10 min  Past Medical History:  Past Medical History  Diagnosis Date  . A-fib     chronic afib - did not like coumadin  . Coronary artery disease     s/p PCI Sept 2013 with DES OM  . Diverticulitis with perforation    Past Surgical History:  Past Surgical History  Procedure Date  . Cardiac catheterization   . Laparotomy 09/02/2012    Procedure: EXPLORATORY LAPAROTOMY;  Surgeon: Nada Libman, MD;  Location: [facility] OR;  Service: Vascular;  Laterality: N/A;  Exploratory Laparotomy with Sigmoid resection with end colocstomy.  . Colostomy revision 09/02/2012    Procedure: COLON RESECTION SIGMOID;  Surgeon: Nada Libman, MD;  Location: [facility] OR;  Service: Vascular;  Laterality: N/A;  Sigmoid resection with End Colostomy.   HPI:  62 yo male smoker transferred from [place] on 09/02/2012 with abdominal pain with lactic acidosis, and found to have perforated sigmoid diverticulitis.  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  Developed respiratory distress and confusion 12/31>>transferred to SDU, placed on oxygen and CIWA started.  Developed fever from PNA, VDRF 1/01 and transferred to ICU.  Extubated 1/2; has been tolerating clear liquids but with choking episode this am per nursing.     Assessment / Plan / Recommendation Clinical Impression  Pt presents with functional oropharyngeal swallow with appropriate oral preparation, swift swallow trigger, and consistent expiratory response post-swallow.  Confusion, impulsivity are primary safety barriers at this time; biomechanics of swallow appear to be WNL.  Recommend resuming liquid diet - supervise to ensure pt's rate of consumption/bolus quantity is reasonable given cognitive deficits.      Aspiration Risk  Mild    Diet  Recommendation Thin liquid   Liquid Administration via: Cup Medication Administration: Whole meds with liquid Supervision: Patient able to self feed;Intermittent supervision to cue for compensatory strategies Compensations: Slow rate;Small sips/bites Postural Changes and/or Swallow Maneuvers: Out of bed for meals;Seated upright 90 degrees    Other  Recommendations Oral Care Recommendations: Oral care BID   Follow Up Recommendations  None             Swallow Study Prior Functional Status       General Date of Onset: 09/02/12 HPI: 62 yo male smoker transferred from [place] on 09/02/2012 with abdominal pain with lactic acidosis, and found to have perforated sigmoid diverticulitis.  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  Developed respiratory distress and confusion 12/31>>transferred to SDU, placed on oxygen and CIWA started.  Developed fever from PNA, VDRF 1/01 and transferred to ICU.  Extubated 1/2; has been tolerating clear liquids but with choking episode this am per nursing.   Type of Study: Bedside swallow evaluation Diet Prior to this Study: Thin liquids Temperature Spikes Noted: No Respiratory Status: Supplemental O2 delivered via (comment) ([place] 2 liters) History of Recent Intubation: Yes Length of Intubations (days): 2 days Date extubated: 09/07/12 Behavior/Cognition: Confused;Distractible;Doesn't follow directions Oral Cavity - Dentition: Adequate natural dentition;Missing dentition Self-Feeding Abilities: Able to feed self Patient Positioning: Upright in chair Baseline Vocal Quality: Clear Volitional Cough: Strong Volitional Swallow: Able to elicit    Oral/Motor/Sensory Function Overall Oral Motor/Sensory Function: Appears within functional limits for tasks assessed   Ice Chips Ice chips: Within functional limits   Thin Liquid Thin Liquid: Within  functional limits Presentation: Cup    Nectar Thick Nectar Thick Liquid: Not  tested   Honey Thick Honey Thick Liquid: Not tested   Puree Puree: Within functional limits Presentation: Self Fed   Solid   GO    Solid: Not tested      Marchelle Folks L. Samson Frederic, [place] CCC/SLP Pager [phone]  Blenda Mounts Laurice 09/11/2012,12:29 PM

## 2012-09-11 NOTE — Consult Note (Addendum)
CARDIOLOGY CONSULT NOTE    Patient ID: Nour Scalise MRN: [id number] DOB/AGE: 1950/12/17 62 y.o.  Admit date: 09/02/2012 Referring Physician Violeta Gelinas MD Primary Physician N/A Primary Cardiologist Gypsy Balsam MD Reason for Consultation Atrial fibrillation  HPI: Mr. Steppe is seen at the request of Dr. Janee Morn for management of atrial fibrillation. He is a 62 year old white male resident of [place] who was admitted on 09/02/2012 with an acute abdomen. He underwent emergent surgery for a sigmoid perforation from diverticulitis. He had sigmoid resection and end colostomy on December 29. The patient has a known history of coronary disease and is status post stenting of the obtuse marginal vessel in September of 2013 with a drug-eluting stent. He was on dual antiplatelet therapy with aspirin and Effient. He also has a history of permanent atrial fibrillation that was managed with metoprolol and digoxin. He was on Coumadin at one point but due to noncompliance this was discontinued. His [place] score was only 1. He has no prior history of CVA or TIA. He has no history of bleeding disorder. He denies any history of diabetes or congestive heart failure. His postoperative course here has been complicated by pneumonia and pulmonary edema. These issues have improved. He is still n.p.o. except for sips with medication. Patient does have a history of alcohol use. Patient is confused. He does remember some details of his medical history but is confused about dates. Ejection fraction has been normal in the past. The patient was seen earlier this hospital stay by cardiology signed off on January 1.  Review of systems complete and found to be negative unless listed above   Past Medical History  Diagnosis Date  . A-fib     chronic afib - did not like coumadin  . Coronary artery disease     s/p PCI  . Diverticulitis with perforation     History reviewed. No pertinent family history.    History   Social History  . Marital Status: Divorced    Spouse Name: N/A    Number of Children: N/A  . Years of Education: N/A   Occupational History  . Not on file.   Social History Main Topics  . Smoking status: Current Every Day Smoker -- 0.2 packs/day for 40 years    Types: Cigarettes  . Smokeless tobacco: Never Used  . Alcohol Use: 1.2 oz/week    2 Cans of beer per week     Comment: Daily   2 to  5 beers  . Drug Use: No  . Sexually Active:    Other Topics Concern  . Not on file   Social History Narrative  . No narrative on file    Past Surgical History  Procedure Date  . Cardiac catheterization   . Laparotomy 09/02/2012    Procedure: EXPLORATORY LAPAROTOMY;  Surgeon: Nada Libman, MD;  Location: [facility] OR;  Service: Vascular;  Laterality: N/A;  Exploratory Laparotomy with Sigmoid resection with end colocstomy.  . Colostomy revision 09/02/2012    Procedure: COLON RESECTION SIGMOID;  Surgeon: Nada Libman, MD;  Location: [facility] OR;  Service: Vascular;  Laterality: N/A;  Sigmoid resection with End Colostomy.     Prescriptions prior to admission  Medication Sig Dispense Refill  . aspirin EC 325 MG tablet Take 325 mg by mouth daily.      . digoxin (LANOXIN) 0.25 MG tablet Take 0.25 mg by mouth daily.      . metoprolol succinate (TOPROL-XL) 50 MG 24 hr tablet Take  50 mg by mouth 2 (two) times daily. Take with or immediately following a meal.      . nitroGLYCERIN (NITROSTAT) 0.4 MG SL tablet Place 0.4 mg under the tongue every 5 (five) minutes as needed. x3 doses For chest pain      . prasugrel (EFFIENT) 10 MG TABS Take 10 mg by mouth daily.          Marland Kitchen albuterol  2.5 mg Nebulization Q6H  . antiseptic oral rinse  15 mL Mouth Rinse QID  . aspirin EC  81 mg Oral Daily  . chlorhexidine  15 mL Mouth Rinse BID  . digoxin  0.25 mg Oral Daily  . folic acid  1 mg Oral Daily  . heparin subcutaneous  5,000 Units Subcutaneous Q8H  . ipratropium  0.5 mg Nebulization Q6H  .  metoprolol tartrate  50 mg Oral Q12H  . nystatin cream   Topical BID  . pantoprazole  40 mg Oral Q1200  . piperacillin-tazobactam (ZOSYN)  IV  3.375 g Intravenous Q8H  . prasugrel  10 mg Oral Daily  . thiamine  100 mg Oral Daily  . vancomycin  1,250 mg Intravenous Q12H   Physical Exam: Blood pressure 130/77, pulse 87, temperature 98.1 F (36.7 C), temperature source Oral, resp. rate 20, height 6' (1.829 m), weight 180 lb 1.9 oz (81.7 kg), SpO2 100.00%.   Patient is a pleasant but confused middle-aged white male in no acute distress. HEENT: Normocephalic, atraumatic. Pupils are equal round and reactive to light accommodation. Sclera are clear. Extraocular movements are full. Oropharynx is clear with good dentition. He has no jugular venous distention or bruits. There is no thyromegaly or adenopathy. Lungs: Few scattered rhonchi Cardiovascular: Irregular rate and rhythm, normal S1 and S2, no gallop or rub. Soft 1/6 systolic murmur at the apex. Abdomen: Normal bowel sounds. Soft and nontender. Surgical scars healing. Extremities: No cyanosis, clubbing, or edema. Pulses are 2+ and symmetric. Neuro: Patient is alert and oriented to self and place. He is confused. No focal findings.  Labs:   Lab Results  Component Value Date   WBC 15.5* 09/11/2012   HGB 12.9* 09/11/2012   HCT 36.7* 09/11/2012   MCV 99.2 09/11/2012   PLT 251 09/11/2012    Lab 09/11/12 0437 09/06/12 1747  NA 132* --  K 3.5 --  CL 96 --  CO2 29 --  BUN 8 --  CREATININE 1.15 --  CALCIUM 8.3* --  PROT -- 4.8*  BILITOT -- 2.6*  ALKPHOS -- 78  ALT -- 12  AST -- 23  GLUCOSE 99 --   Lab Results  Component Value Date   TROPONINI <0.30 09/09/2012      Radiology:*RADIOLOGY REPORT*  Clinical Data: Shortness of breath, question pneumonia.  CHEST - 2 VIEW  Comparison: 09/09/2012  Findings: Decreasing right perihilar and lower lobe airspace  disease. Mild residual right basilar infiltrate. Mild residual  density in the medial  left lung base as well, slightly improved.  No effusions. Heart is normal size. No acute bony abnormality.  IMPRESSION:  Improving bilateral infiltrates.  Original Report Authenticated By: Charlett Nose, M.D.   EKG:09-Sep-2012 19:14:78 [facility]-23 ROUTINE RECORD Atrial fibrillation with rapid ventricular response Low voltage QRS Cannot rule out Anterior infarct , age undetermined Abnormal ECG Nonspecific ST and T wave abnormality New since previous tracing  Echo:Transthoracic Echocardiography  Patient: Fadel, Clason MR #: [id number] Study Date: 09/09/2012 Gender: M Age: 32 Height: 182.9cm Weight: 81.6kg BSA: 2.34m^2 Pt. Status:  Room: 2306  PERFORMING [place], [facility], [place] B REFERRING Samara Snide, Md SONOGRAPHER [place], RDCS ATTENDING Durene Cal cc:  ------------------------------------------------------------ LV EF: 60% - 65%  ------------------------------------------------------------ Indications: Supraventricular tachycardia 427.0.  ------------------------------------------------------------ History: PMH: Evaluate pulmonary edema and elevated BNP. Atrial fibrillation. Risk factors: Perforated sigmoid colon. Delirium. NSVT. Anasarca. Thrombocytopenia. Fever.  ------------------------------------------------------------ Study Conclusions  - Left ventricle: The cavity size was normal. Wall thickness was increased in a pattern of mild LVH. Systolic function was normal. The estimated ejection fraction was in the range of 60% to 65%. Wall motion was normal; there were no regional wall motion abnormalities. The study is not technically sufficient to allow evaluation of LV diastolic function. - Mitral valve: Moderate prolapse, involving the posterior leaflet. Mild regurgitation. - Left atrium: The atrium was moderately dilated. - Right atrium: The atrium was mildly dilated. - Pulmonary arteries: PA  peak pressure: 34mm Hg (S). Impressions:  - There is moderate prolapse of posterior MV leafet; eccentric MR jet makes severity difficult to quantitate; appears mild but may be underestimated; suggest TEE to further assess. Transthoracic echocardiography. M-mode, complete 2D, spectral Doppler, and color Doppler. Height: Height: 182.9cm. Height: 72in. Weight: Weight: 81.6kg. Weight: 179.6lb. Body mass index: BMI: 24.4kg/m^2. Body surface area: BSA: 2.61m^2. Blood pressure: 130/81. Patient status: Inpatient. Location: ICU/CCU  ------------------------------------------------------------  ------------------------------------------------------------ Left ventricle: The cavity size was normal. Wall thickness was increased in a pattern of mild LVH. Systolic function was normal. The estimated ejection fraction was in the range of 60% to 65%. Wall motion was normal; there were no regional wall motion abnormalities. The study is not technically sufficient to allow evaluation of LV diastolic function.  ------------------------------------------------------------ Aortic valve: Trileaflet; normal thickness leaflets. Mobility was not restricted. Doppler: Transvalvular velocity was within the normal range. There was no stenosis. No regurgitation.  ------------------------------------------------------------ Aorta: Aortic root: The aortic root was mildly dilated.  ------------------------------------------------------------ Mitral valve: Mildly thickened leaflets . Mobility was not restricted. Moderate prolapse, involving the posterior leaflet. Doppler: Transvalvular velocity was within the normal range. There was no evidence for stenosis. Mild regurgitation.  ------------------------------------------------------------ Left atrium: The atrium was moderately dilated.  ------------------------------------------------------------ Right ventricle: The cavity size was normal.  Systolic function was normal.  ------------------------------------------------------------ Pulmonic valve: Doppler: Transvalvular velocity was within the normal range. There was no evidence for stenosis. Trivial regurgitation.  ------------------------------------------------------------ Tricuspid valve: Structurally normal valve. Doppler: Transvalvular velocity was within the normal range. Trivial regurgitation.  ------------------------------------------------------------ Pulmonary artery: Systolic pressure was within the normal range.  ------------------------------------------------------------ Right atrium: The atrium was mildly dilated.  ------------------------------------------------------------ Pericardium: There was no pericardial effusion.  ------------------------------------------------------------  2D measurements Normal Doppler measurements Normal Left ventricle Main pulmonary LVID ED, 38.4 mm 43-52 artery chord, Pressure, S 34 mm =30 PLAX Hg LVID ES, 28.1 mm 23-38 Tricuspid valve chord, Regurg peak 245 cm/s ------ PLAX vel FS, chord, 27 % >29 Peak RV-RA 24 mm ------ PLAX gradient, S Hg LVPW, ED 14 mm ------ Systemic veins IVS/LVPW 1.04 <1.3 Estimated 10 mm ------ ratio, ED CVP Hg Ventricular septum Right ventricle IVS, ED 14.6 mm ------ Pressure, S 34 mm <30 Aorta Hg Root diam, 38 mm ------ ED Left atrium AP dim 54 mm ------ AP dim 2.65 cm/m^2 <2.2 index Vol, S 111 ml ------ Vol index, 54.4 ml/m^2 ------ S  ------------------------------------------------------------ Prepared and Electronically Authenticated by  Olga Millers 2014-01-04T13:16:[id number]   ASSESSMENT AND PLAN:  1. Permanent atrial fibrillation. Rate is currently well controlled on IV diltiazem. Oral metoprolol and  digoxin have just been resumed. We should be able to discontinue his diltiazem once these medications are onboard. He is a poor candidate for long-term  anticoagulation because of his history of noncompliance. Patient to followup with his primary cardiologist in [place] on discharge.  2. Coronary disease status post drug-eluting stent of the obtuse marginal vessel in September of 2013. Optimally he should be on dual antiplatelet therapy for one year. We will continue his current dose of aspirin and add Effient 10 mg daily.  3. Mitral valve prolapse with mild mitral insufficiency.  4. Sigmoid diverticulitis with perforation status post sigmoid colectomy and end colostomy.  5. Acute delirium/confusion probably related to DTs.  6. Acute pulmonary edema now resolving related to acute cardiac stress and volume shift/resuscitation with surgery.  SignedTheron Arista [facility] 09/11/2012, 11:17 AM

## 2012-09-11 NOTE — Progress Notes (Addendum)
9 Days Post-Op  Subjective: Up in chair, coughing a lot after clears  Objective: Vital signs in last 24 hours: Temp:  [98 F (36.7 C)-99.5 F (37.5 C)] 98.1 F (36.7 C) (01/06 0804) Pulse Rate:  [82-134] 84  (01/06 0409) Resp:  [14-25] 20  (01/06 0409) BP: (120-146)/(66-86) 130/77 mmHg (01/06 0804) SpO2:  [91 %-100 %] 100 % (01/06 0814) Last BM Date:  (colostomy)  Intake/Output from previous day: 01/05 0701 - 01/06 0700 In: 1180 [P.O.:600; I.V.:230; IV Piggyback:350] Out: 1480 [Urine:625; Stool:855] Intake/Output this shift: Total I/O In: -  Out: 1375 [Urine:775; Stool:600]  General appearance: cooperative Resp: few rhonchi B Cardio: irregularly irregular rhythm GI: soft, ostomy pink and functional, incision small openings nearly closed, dry dressing placed. +BS Incision/Wound:  Lab Results:   Basename 09/11/12 0437 09/10/12 0425  WBC 15.5* 13.7*  HGB 12.9* 12.8*  HCT 36.7* 36.4*  PLT 251 216   BMET  Basename 09/11/12 0437 09/10/12 0425  NA 132* 133*  K 3.5 3.0*  CL 96 95*  CO2 29 31  GLUCOSE 99 96  BUN 8 6  CREATININE 1.15 0.70  CALCIUM 8.3* 8.0*   PT/INR No results found for this basename: LABPROT:2,INR:2 in the last 72 hours ABG No results found for this basename: PHART:2,PCO2:2,PO2:2,HCO3:2 in the last 72 hours  Studies/Results: Dg Chest 2 View  09/11/2012  *RADIOLOGY REPORT*  Clinical Data: Shortness of breath, question pneumonia.  CHEST - 2 VIEW  Comparison: 09/09/2012  Findings: Decreasing right perihilar and lower lobe airspace disease.  Mild residual right basilar infiltrate.  Mild residual density in the medial left lung base as well, slightly improved. No effusions.  Heart is normal size.  No acute bony abnormality.  IMPRESSION: Improving bilateral infiltrates.   Original Report Authenticated By: Charlett Nose, M.D.     Anti-infectives: Anti-infectives     Start     Dose/Rate Route Frequency Ordered Stop   09/06/12 1700   vancomycin (VANCOCIN)  1,250 mg in sodium chloride 0.9 % 250 mL IVPB        1,250 mg 166.7 mL/hr over 90 Minutes Intravenous Every 12 hours 09/06/12 1641     09/06/12 1700  piperacillin-tazobactam (ZOSYN) IVPB 3.375 g       3.375 g 12.5 mL/hr over 240 Minutes Intravenous 3 times per day 09/06/12 1641     09/04/12 2300   ertapenem (INVANZ) 1 g in sodium chloride 0.9 % 50 mL IVPB  Status:  Discontinued        1 g 100 mL/hr over 30 Minutes Intravenous  Once 09/03/12 0200 09/03/12 0240   09/03/12 0600   ceFAZolin (ANCEF) IVPB 1 g/50 mL premix  Status:  Discontinued     Comments: Send with pt to OR      1 g 100 mL/hr over 30 Minutes Intravenous On call 09/02/12 2140 09/02/12 2148   09/03/12 0600   ceFAZolin (ANCEF) IVPB 2 g/50 mL premix     Comments: Send with pt to OR      2 g 100 mL/hr over 30 Minutes Intravenous On call 09/02/12 2148 09/02/12 2212   09/03/12 0400   ciprofloxacin (CIPRO) IVPB 400 mg  Status:  Discontinued        400 mg 200 mL/hr over 60 Minutes Intravenous 2 times daily 09/03/12 0241 09/06/12 1607   09/03/12 0400   metroNIDAZOLE (FLAGYL) IVPB 500 mg  Status:  Discontinued        500 mg 100 mL/hr over 60 Minutes Intravenous  3 times per day 09/03/12 0241 09/06/12 1607   09/03/12 0045   ciprofloxacin (CIPRO) IVPB 400 mg  Status:  Discontinued        400 mg 200 mL/hr over 60 Minutes Intravenous Every 12 hours 09/03/12 0035 09/03/12 0235   09/03/12 0045   metroNIDAZOLE (FLAGYL) IVPB 500 mg  Status:  Discontinued        500 mg 100 mL/hr over 60 Minutes Intravenous Every 8 hours 09/03/12 0035 09/03/12 0235          Assessment/Plan: s/p Procedure(s) (LRB) with comments: EXPLORATORY LAPAROTOMY (N/A) - Exploratory Laparotomy with Sigmoid resection with end colocstomy. COLON RESECTION SIGMOID (N/A) - Sigmoid resection with End Colostomy. S/P hartmans for perf diverticulitis POD#9 - bowel function returned ID - Vanc/Zosyn now empiric, resp Cx neg. Once blood Cx final will adjust vs D/C  ABX. WBC up a bit ?dysphagia - ST eval, NPO except meds P that Deconditioning - PT/OT ETOH WD - CIWA VTE - heparin A fib - I called [place] [facility] to see, echo done, still on Dilt drip, on Lopressor Continue 3300  LOS: 9 days    Lanique Gonzalo E 09/11/2012

## 2012-09-11 NOTE — Progress Notes (Signed)
Patient sitting up in chair taking clear liquids when he began to choke and cough on jello. Patient made NPO. Dr. Janee Morn in to see the patient and made aware. He will place order for speech to come and see the patient. Also he changed the abdominal dressing,incision appears clean dry and intact with staples. Also made MD aware of of change in mental status and addition of wrist restraints applied. Dr. Janee Morn questioned the patient about his drinking habits.

## 2012-09-11 NOTE — Progress Notes (Signed)
Physical Therapy Treatment Patient Details Name: Johnathan Bauer MRN: [id number] DOB: 21-Mar-1951 Today's Date: 09/11/2012 Time: 4098-1191 PT Time Calculation (min): 24 min  PT Assessment / Plan / Recommendation Comments on Treatment Session  Pt continues to be impulsive however less impulsive than evaluation but continues to need constant cues for safety with impaired judgement/safety awareness.  Noticeable coughing after swallowing thin liquids.  MD aware and ordering Speech consult.  Continue to recommend HHPT vs SNF depending on overall mobility improvement and cognitivie awareness.      Follow Up Recommendations  Home health PT;Supervision/Assistance - 24 hour (HH vs SNF depending on progress and level assistance at home)     Equipment Recommendations  Rolling walker with 5" wheels    Recommendations for Other Services Speech consult  Frequency Min 3X/week   Plan Discharge plan remains appropriate;Frequency remains appropriate    Precautions / Restrictions Precautions Precautions: Fall Restrictions Weight Bearing Restrictions: No   Pertinent Vitals/Pain C/o lower abdominal pain but does not rate    Mobility  Bed Mobility Bed Mobility: Supine to Sit Supine to Sit: 4: Min guard;HOB elevated Details for Bed Mobility Assistance: Minguard for safety and cues for safety Transfers Transfers: Sit to Stand;Stand to Sit Sit to Stand: 3: Mod assist;From chair/3-in-1 Stand to Sit: 3: Mod assist;To chair/3-in-1 Details for Transfer Assistance: (A) to initiate transfer and slowly descend to recliner.  Pt attempts to stand with UE on RW and needs max cues for hand placement.  Pt continues to be impulsive and needs constant cues for safety Ambulation/Gait Ambulation/Gait Assistance: 1: +2 Total assist Ambulation/Gait: Patient Percentage: 70% Ambulation Distance (Feet): 125 Feet Assistive device: Rolling walker Ambulation/Gait Assistance Details: +2 (A) for safety and lines.  Pt needs (A)  to maneuver RW and max cues for body position within RW.  Pt continues to keep RW too far in front and difficulty with turns with RW. Gait Pattern: Step-through pattern;Decreased stride length;Shuffle;Trunk flexed Stairs: No    Exercises     PT Diagnosis:    PT Problem List:   PT Treatment Interventions:     PT Goals Acute Rehab PT Goals PT Goal Formulation: With patient Time For Goal Achievement: 09/24/12 Potential to Achieve Goals: Good Pt will go Sit to Stand: with supervision PT Goal: Sit to Stand - Progress: Progressing toward goal Pt will go Stand to Sit: with supervision PT Goal: Stand to Sit - Progress: Progressing toward goal Pt will Transfer Bed to Chair/Chair to Bed: with supervision PT Transfer Goal: Bed to Chair/Chair to Bed - Progress: Progressing toward goal Pt will Ambulate: >150 feet;with supervision;with least restrictive assistive device;Other (comment) PT Goal: Ambulate - Progress: Progressing toward goal  Visit Information  Last PT Received On: 09/11/12 Assistance Needed: +2 (for safety)    Subjective Data  Subjective: "[facility] Patient Stated Goal: unable to set   Cognition  Overall Cognitive Status: Impaired Area of Impairment: Safety/judgement;Following commands;Problem solving;Awareness of deficits;Memory Arousal/Alertness: Awake/alert Orientation Level: Appears intact for tasks assessed Behavior During Session: [facility] for tasks performed Following Commands: Follows one step commands consistently;Follows multi-step commands inconsistently;Follows multi-step commands with increased time Safety/Judgement: Decreased awareness of safety precautions;Impulsive;Decreased awareness of need for assistance;Decreased safety judgement for tasks assessed Safety/Judgement - Other Comments: Imparied overall safety and judgement Problem Solving: Pt slow to process and needs extra time to complete task.    Balance  Balance Balance  Assessed: Yes Static Standing Balance Static Standing - Balance Support: Bilateral upper extremity supported  Static Standing - Level of Assistance: 5: Stand by assistance;4: Min assist Static Standing - Comment/# of Minutes: Occasional (A) to maintain upright posture due to impulsive nature  End of Session PT - End of Session Equipment Utilized During Treatment: Gait belt;Oxygen (2L) Activity Tolerance: Patient tolerated treatment well Patient left: in chair;with call bell/phone within reach Nurse Communication: Mobility status   GP     Stellah Donovan 09/11/2012, 9:20 AM Jake Shark, PT DPT [phone]

## 2012-09-11 NOTE — Progress Notes (Signed)
PULMONARY  / CRITICAL CARE MEDICINE  Name: Octavious Zidek MRN: [id number] DOB: Aug 16, 1951    LOS: 9  REFERRING MD :  Abigail Miyamoto  CHIEF COMPLAINT:  Short of breath  BRIEF PATIENT DESCRIPTION:  62 yo male smoker transferred from [place] on 09/02/2012 with abdominal pain with lactic acidosis, and found to have perforated sigmoid diverticulitis.  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  Developed respiratory distress and confusion 12/31>>transferred to SDU, placed on oxygen and CIWA started.  Developed fever from PNA, VDRF 1/01 and transferred to ICU with PCCM consult.  Now out of ICU .  On nasal cannula and doing well except still in Afib rate controlled with dilt drip.   LINES / TUBES: ETT 1/01 >> 1/02 Lt [place] CVL 1/01 >>   CULTURES: Blood 1/01 >>Neg Sputum 1/01 >>candida  ANTIBIOTICS: Ancef 12/28>>12/28 Cipro 12/28>>1/01 Flagyl 12/28>>1/01 Vancomycin 1/01>> Zosyn 1/01>>  SIGNIFICANT EVENTS:  12/29 Laparotomy 1/01 Transfer ICU, PNA, DT's, VDRF 1/04 A fib with RVR >> add cardizem gtt 1/04 Echocardiogram>> mild LVH, EF 60 to 65%, mod Mitral prolapse, mild MR, mod LA dilation, PAS 34 mmhg 1/04 Cardizem gtt for a fib RVR  LEVEL OF CARE:  SDU PRIMARY SERVICE:  CCS CONSULTANTS:  PCCM CODE STATUS: Full DIET:  Clear liquid DVT Px:  SQ heparin GI Px:  Protonix   INTERVAL HISTORY:  Sl confused. Less agitated.  On dilt drip with afib rate controlled  VITAL SIGNS: Temp:  [98 F (36.7 C)-99.5 F (37.5 C)] 98.1 F (36.7 C) (01/06 0804) Pulse Rate:  [82-134] 84  (01/06 0409) Resp:  [14-25] 20  (01/06 0409) BP: (120-146)/(66-86) 130/77 mmHg (01/06 0804) SpO2:  [91 %-100 %] 100 % (01/06 0814) INTAKE / OUTPUT: Intake/Output      01/05 0701 - 01/06 0700 01/06 0701 - 01/07 0700   P.O. 600    I.V. (mL/kg) 230 (2.8)    IV Piggyback 350    Total Intake(mL/kg) 1180 (14.4)    Urine (mL/kg/hr) 625 (0.3) 775   Stool 855 600   Total  Output 1480 1375   Net -300 -1375        Stool Occurrence 1 x      PHYSICAL EXAMINATION: General: NAD Neuro: Intermittently confused, follows commands HEENT: WNL Cardiovascular:  irregular Lungs: scattered rhonchi - improved Abdomen:  Wound dressing clean, soft, decreased BS, ostomy pink Musculoskeletal:  Severe bilat UE and bilat LE edema Skin: warm, no rashes   LABS: Cbc  Lab 09/11/12 0437 09/10/12 0425 09/09/12 0446  WBC 15.5* -- --  HGB 12.9* 12.8* 13.4  HCT 36.7* 36.4* 38.5*  PLT 251 216 183    Chemistry   Lab 09/11/12 0437 09/10/12 0425 09/09/12 0446 09/06/12 1747  NA 132* 133* 135 --  K 3.5 3.0* 3.3* --  CL 96 95* 97 --  CO2 29 31 29  --  BUN 8 6 10  --  CREATININE 1.15 0.70 0.74 --  CALCIUM 8.3* 8.0* 7.6* --  MG -- -- -- 1.1*  PHOS -- -- -- 1.8*  GLUCOSE 99 96 117* --    Liver fxn  Lab 09/06/12 1747  AST 23  ALT 12  ALKPHOS 78  BILITOT 2.6*  PROT 4.8*  ALBUMIN 2.3*   coags  Lab 09/06/12 1747  APTT 35  INR 1.28   Sepsis markers  Lab 09/06/12 1748  LATICACIDVEN 1.5  PROCALCITON --   ABG  Lab 09/06/12 1725  PHART 7.464*  PCO2ART 39.8  PO2ART 69.0*  HCO3 28.2*  TCO2 29    CBG trend  Lab 09/10/12 0812 09/10/12 09/09/12 1959 09/09/12 1543 09/09/12 1145  GLUCAP 62* 84 100* 169* 102*     CXR:  1/6: improved infiltrates    ASSESSMENT Respiratory failure, post-operative 2nd to PNA and Pulmonary edema  Continue vancomycin/zosyn>>?d/c vanco     Perforated sigmoid colon  S/P laparotomy and sigmoid colon resection 12/29  Post op mgmt per [facility]CS   Delirium tremens  PRN ativan for CIWA > 8   Pulmonary edema improved  No prior history of CHF documented  CXR pattern more suggestive of edema than PNA>>improved 1/6   Atrial fibrillation with RVR>>now controlled  Mitral valve prolapse  h/o CAD  Continue digoxin   Cont oral lopressor  Wean off cardizem gtt as tolerated  Cardiology consultation request noted   NSVT - 7 beat  run 1/02  None further   Anasarca, likely due to post op volume resuscitation   Thrombocytopenia >> resolved  Likely from sepsis, ETOH.  Resume SQ heparin 1/04    Hypokalemia  F/u and replace as needed  OOB to chair.  PT consulted. D/c Foley.  Transfer to tele once off cardizem gtt.  Dorcas Carrow Seffner Pulmonary/Critical Care 09/11/2012, 10:03 AM Alvino Chapel  [phone]  Cell  [phone]  If no response or cell goes to voicemail, call beeper [phone]

## 2012-09-11 NOTE — Progress Notes (Signed)
ANTIBIOTIC CONSULT NOTE - FOLLOW UP  Pharmacy Consult for vancomycin  Indication: HCAP  No Known Allergies  Patient Measurements: Height: 6' (182.9 cm) Weight: 180 lb 1.9 oz (81.7 kg) IBW/kg (Calculated) : 77.6    Vital Signs: Temp: 97.7 F (36.5 C) (01/06 1553) Temp src: Oral (01/06 1553) BP: 121/72 mmHg (01/06 1200) Pulse Rate: 64  (01/06 1200) Intake/Output from previous day: 01/05 0701 - 01/06 0700 In: 1180 [P.O.:600; I.V.:230; IV Piggyback:350] Out: 1480 [Urine:625; Stool:855] Intake/Output from this shift: Total I/O In: -  Out: 2100 [Urine:1500; Stool:600]  Labs:  [facility] 09/11/12 0437 09/10/12 0425 09/09/12 0446  WBC 15.5* 13.7* 9.7  HGB 12.9* 12.8* 13.4  PLT 251 216 183  LABCREA -- -- --  CREATININE 1.15 0.70 0.74   Estimated Creatinine Clearance: 74 ml/min (by C-G formula based on Cr of 1.15).  Basename 09/08/12 1610  VANCOTROUGH 16.4  VANCOPEAK --  VANCORANDOM --  GENTTROUGH --  GENTPEAK --  GENTRANDOM --  TOBRATROUGH --  TOBRAPEAK --  TOBRARND --  AMIKACINPEAK --  AMIKACINTROU --  AMIKACIN --     Microbiology: Recent Results (from the past 720 hour(s))  MRSA PCR SCREENING     Status: Normal   Collection Time   09/03/12  3:00 AM      Component Value Range Status Comment   MRSA by PCR NEGATIVE  NEGATIVE Final   CULTURE, BLOOD (ROUTINE X 2)     Status: Normal (Preliminary result)   Collection Time   09/06/12  5:45 PM      Component Value Range Status Comment   Specimen Description BLOOD RIGHT HAND   Final    Special Requests BOTTLES DRAWN AEROBIC ONLY 10CC   Final    Culture  Setup Time 09/07/2012 01:19   Final    Culture     Final    Value:        BLOOD CULTURE RECEIVED NO GROWTH TO DATE CULTURE WILL BE HELD FOR 5 DAYS BEFORE ISSUING A FINAL NEGATIVE REPORT   Report Status PENDING   Incomplete   CULTURE, BLOOD (ROUTINE X 2)     Status: Normal (Preliminary result)   Collection Time   09/06/12  5:46 PM      Component Value Range Status  Comment   Specimen Description BLOOD RIGHT HAND   Final    Special Requests BOTTLES DRAWN AEROBIC ONLY 10CC   Final    Culture  Setup Time 09/07/2012 01:19   Final    Culture     Final    Value:        BLOOD CULTURE RECEIVED NO GROWTH TO DATE CULTURE WILL BE HELD FOR 5 DAYS BEFORE ISSUING A FINAL NEGATIVE REPORT   Report Status PENDING   Incomplete   CULTURE, RESPIRATORY     Status: Normal   Collection Time   09/07/12  5:21 PM      Component Value Range Status Comment   Specimen Description ENDOTRACHEAL   Final    Special Requests NONE   Final    Gram Stain     Final    Value: ABUNDANT WBC PRESENT,BOTH PMN AND MONONUCLEAR     NO SQUAMOUS EPITHELIAL CELLS SEEN     NO ORGANISMS SEEN   Culture FEW CANDIDA ALBICANS   Final    Report Status 09/10/2012 FINAL   Final     Anti-infectives     Start     Dose/Rate Route Frequency Ordered Stop   09/06/12 1700  vancomycin (VANCOCIN) 1,250 mg in sodium chloride 0.9 % 250 mL IVPB        1,250 mg 166.7 mL/hr over 90 Minutes Intravenous Every 12 hours 09/06/12 1641     09/06/12 1700   piperacillin-tazobactam (ZOSYN) IVPB 3.375 g        3.375 g 12.5 mL/hr over 240 Minutes Intravenous 3 times per day 09/06/12 1641     09/04/12 2300   ertapenem (INVANZ) 1 g in sodium chloride 0.9 % 50 mL IVPB  Status:  Discontinued        1 g 100 mL/hr over 30 Minutes Intravenous  Once 09/03/12 0200 09/03/12 0240   09/03/12 0600   ceFAZolin (ANCEF) IVPB 1 g/50 mL premix  Status:  Discontinued     Comments: Send with pt to OR      1 g 100 mL/hr over 30 Minutes Intravenous On call 09/02/12 2140 09/02/12 2148   09/03/12 0600   ceFAZolin (ANCEF) IVPB 2 g/50 mL premix     Comments: Send with pt to OR      2 g 100 mL/hr over 30 Minutes Intravenous On call 09/02/12 2148 09/02/12 2212   09/03/12 0400   ciprofloxacin (CIPRO) IVPB 400 mg  Status:  Discontinued        400 mg 200 mL/hr over 60 Minutes Intravenous 2 times daily 09/03/12 0241 09/06/12 1607   09/03/12  0400   metroNIDAZOLE (FLAGYL) IVPB 500 mg  Status:  Discontinued        500 mg 100 mL/hr over 60 Minutes Intravenous 3 times per day 09/03/12 0241 09/06/12 1607   09/03/12 0045   ciprofloxacin (CIPRO) IVPB 400 mg  Status:  Discontinued        400 mg 200 mL/hr over 60 Minutes Intravenous Every 12 hours 09/03/12 0035 09/03/12 0235   09/03/12 0045   metroNIDAZOLE (FLAGYL) IVPB 500 mg  Status:  Discontinued        500 mg 100 mL/hr over 60 Minutes Intravenous Every 8 hours 09/03/12 0035 09/03/12 0235          Assessment: 62 yo male with HCAP is currently on day #5 Vancomycin and Zosyn.  He is s/p emergent laparotomy with sigmoid colon resection, and end colostomy 09/03/12. Developed respiratory distress and confusion 12/31.  Started on Vanc and zosyn on 09/06/12 for PNA and fever. Vancomycin trough was 16.4 (therapeutic) on 09/08/12 on current dosage of 1250 mg IV q12h. Patient is afebrile. WBC has increased to 15.5K.  SCr is 1.15 from 0.7 and previously = 0.9. CrCl ~ 74 ml/min.  UOP 0.3 ml/kg/hr yesterday.  UOP = 1500 ml so far today.   Blood Cultures x 2 on 09/06/12 = NGTD Resp Cx on 09/07/12  =Candida albicans MRSA PCR screen = Negative  Goal of Therapy:  Vancomycin trough level 15-20 mcg/ml  Plan:  1) Continue vancomycin 1250mg  iv q12h 2) Monitor renal function and recheck level if renal function changes dramatically.  Noah Delaine, RPh Clinical Pharmacist Pager: [phone] 09/11/2012,4:08 PM

## 2012-09-12 LAB — BASIC METABOLIC PANEL
BUN: 11 mg/dL (ref 6–23)
CO2: 28 mEq/L (ref 19–32)
Chloride: 102 mEq/L (ref 96–112)
Creatinine, Ser: 1.84 mg/dL — ABNORMAL HIGH (ref 0.50–1.35)
GFR calc Af Amer: 44 mL/min — ABNORMAL LOW (ref >90)
Potassium: 3.1 mEq/L — ABNORMAL LOW (ref 3.5–5.1)

## 2012-09-12 LAB — CBC
MCH: 34.9 pg — ABNORMAL HIGH (ref 26.0–34.0)
MCHC: 35.2 g/dL (ref 30.0–36.0)
Platelets: 303 10*3/uL (ref 150–400)
RDW: 13.7 % (ref 11.5–15.5)

## 2012-09-12 MED ORDER — FUROSEMIDE 20 MG PO TABS: 20.0000 mg | ORAL_TABLET | Freq: Once | ORAL | Status: DC

## 2012-09-12 MED ORDER — VANCOMYCIN HCL 10 G IV SOLR
1250.0000 mg | INTRAVENOUS | Status: DC
  Administered 2012-09-13: 1250 mg via INTRAVENOUS
  Filled 2012-09-12: qty 1250

## 2012-09-12 MED ORDER — HALOPERIDOL LACTATE 5 MG/ML IJ SOLN
5.0000 mg | Freq: Four times a day (QID) | INTRAMUSCULAR | Status: DC | PRN
  Administered 2012-09-12 – 2012-09-14 (×2): 5 mg via INTRAVENOUS
  Filled 2012-09-12 (×2): qty 1

## 2012-09-12 MED ORDER — ENSURE COMPLETE PO LIQD
237.0000 mL | Freq: Two times a day (BID) | ORAL | Status: DC
  Administered 2012-09-13 – 2012-09-19 (×12): 237 mL via ORAL

## 2012-09-12 MED ORDER — POTASSIUM CHLORIDE CRYS ER 20 MEQ PO TBCR
20.0000 meq | EXTENDED_RELEASE_TABLET | Freq: Two times a day (BID) | ORAL | Status: AC
  Administered 2012-09-12 (×2): 20 meq via ORAL
  Filled 2012-09-12 (×2): qty 1

## 2012-09-12 MED ORDER — POTASSIUM CHLORIDE IN NACL 20-0.9 MEQ/L-% IV SOLN
INTRAVENOUS | Status: DC
  Administered 2012-09-12: 50 mL/h via INTRAVENOUS
  Filled 2012-09-12 (×2): qty 1000

## 2012-09-12 MED ORDER — ALBUTEROL SULFATE (5 MG/ML) 0.5% IN NEBU: 2.5000 mg | INHALATION_SOLUTION | RESPIRATORY_TRACT | Status: DC | PRN

## 2012-09-12 MED ORDER — DILTIAZEM HCL 60 MG PO TABS
60.0000 mg | ORAL_TABLET | Freq: Four times a day (QID) | ORAL | Status: DC
  Administered 2012-09-12 – 2012-09-13 (×5): 60 mg via ORAL
  Filled 2012-09-12 (×9): qty 1

## 2012-09-12 MED ORDER — SODIUM CHLORIDE 0.9 % IV SOLN: INTRAVENOUS | Status: DC

## 2012-09-12 NOTE — Progress Notes (Signed)
10 Days Post-Op  Subjective: Back in waist belt, no new complaints  Objective: Vital signs in last 24 hours: Temp:  [97.7 F (36.5 C)-99.3 F (37.4 C)] 98.7 F (37.1 C) (01/07 0745) Pulse Rate:  [64-87] 81  (01/07 0453) Resp:  [14-20] 14  (01/07 0453) BP: (97-139)/(61-87) 121/67 mmHg (01/07 0745) SpO2:  [91 %-100 %] 94 % (01/07 0453) Last BM Date:  (colostomy)  Intake/Output from previous day: 01/06 0701 - 01/07 0700 In: 1279.5 [P.O.:480; I.V.:249.5; IV Piggyback:550] Out: 3375 [Urine:2075; Stool:1300] Intake/Output this shift:    General appearance: cooperative Resp: minimal rhonchi Cardio: irregularly irregular rhythm GI: soft, +BS, ostomy pink with output, upper two open areas on wound probed with purulent drainage - WTD placed there,  Neuro: mild confusion but redirects and F/C  Lab Results:   [facility] 09/12/12 0520 09/11/12 0437  WBC 13.9* 15.5*  HGB 12.3* 12.9*  HCT 34.9* 36.7*  PLT 303 251   BMET  Basename 09/12/12 0520 09/11/12 0437  NA 138 132*  K 3.1* 3.5  CL 102 96  CO2 28 29  GLUCOSE 100* 99  BUN 11 8  CREATININE 1.84* 1.15  CALCIUM 8.4 8.3*   PT/INR No results found for this basename: LABPROT:2,INR:2 in the last 72 hours ABG No results found for this basename: PHART:2,PCO2:2,PO2:2,HCO3:2 in the last 72 hours  Studies/Results: Dg Chest 2 View  09/11/2012  *RADIOLOGY REPORT*  Clinical Data: Shortness of breath, question pneumonia.  CHEST - 2 VIEW  Comparison: 09/09/2012  Findings: Decreasing right perihilar and lower lobe airspace disease.  Mild residual right basilar infiltrate.  Mild residual density in the medial left lung base as well, slightly improved. No effusions.  Heart is normal size.  No acute bony abnormality.  IMPRESSION: Improving bilateral infiltrates.   Original Report Authenticated By: Charlett Nose, M.D.     Anti-infectives: Anti-infectives     Start     Dose/Rate Route Frequency Ordered Stop   09/06/12 1700   vancomycin  (VANCOCIN) 1,250 mg in sodium chloride 0.9 % 250 mL IVPB        1,250 mg 166.7 mL/hr over 90 Minutes Intravenous Every 12 hours 09/06/12 1641     09/06/12 1700  piperacillin-tazobactam (ZOSYN) IVPB 3.375 g       3.375 g 12.5 mL/hr over 240 Minutes Intravenous 3 times per day 09/06/12 1641     09/04/12 2300   ertapenem (INVANZ) 1 g in sodium chloride 0.9 % 50 mL IVPB  Status:  Discontinued        1 g 100 mL/hr over 30 Minutes Intravenous  Once 09/03/12 0200 09/03/12 0240   09/03/12 0600   ceFAZolin (ANCEF) IVPB 1 g/50 mL premix  Status:  Discontinued     Comments: Send with pt to OR      1 g 100 mL/hr over 30 Minutes Intravenous On call 09/02/12 2140 09/02/12 2148   09/03/12 0600   ceFAZolin (ANCEF) IVPB 2 g/50 mL premix     Comments: Send with pt to OR      2 g 100 mL/hr over 30 Minutes Intravenous On call 09/02/12 2148 09/02/12 2212   09/03/12 0400   ciprofloxacin (CIPRO) IVPB 400 mg  Status:  Discontinued        400 mg 200 mL/hr over 60 Minutes Intravenous 2 times daily 09/03/12 0241 09/06/12 1607   09/03/12 0400   metroNIDAZOLE (FLAGYL) IVPB 500 mg  Status:  Discontinued        500 mg 100 mL/hr  over 60 Minutes Intravenous 3 times per day 09/03/12 0241 09/06/12 1607   09/03/12 0045   ciprofloxacin (CIPRO) IVPB 400 mg  Status:  Discontinued        400 mg 200 mL/hr over 60 Minutes Intravenous Every 12 hours 09/03/12 0035 09/03/12 0235   09/03/12 0045   metroNIDAZOLE (FLAGYL) IVPB 500 mg  Status:  Discontinued        500 mg 100 mL/hr over 60 Minutes Intravenous Every 8 hours 09/03/12 0035 09/03/12 0235          Assessment/Plan: s/p Procedure(s) (LRB) with comments: EXPLORATORY LAPAROTOMY (N/A) - Exploratory Laparotomy with Sigmoid resection with end colocstomy. COLON RESECTION SIGMOID (N/A) - Sigmoid resection with End Colostomy. S/P hartmans for perf diverticulitis POD#10 - bowel function returned, advance diet ID - Vanc/Zosyn now empiric, resp Cx neg. Once blood Cx  final will adjust vs D/C ABX. WBC down slightly Deconditioning - PT/OT Elevated CRT - increase IVF - likely pre-renal as over 2L neg Hypokalemia - replace ETOH WD - CIWA Delirium at night - moving to room with window if able, Haldol PRN VTE - heparin A fib - appreciate cardiology F/U. I spoke to Dr. [place] yesterday as well. Continue 3300  LOS: 10 days    Myanna Ziesmer E 09/12/2012

## 2012-09-12 NOTE — Progress Notes (Signed)
Utilization review completed.  

## 2012-09-12 NOTE — Consult Note (Signed)
WOC follow up Pt up in chair, abdominal binder in place. Ostomy functional, pink.  Per bedside nurse wife to be in room with pt tom and Thursday this week. Will plan educational session with her as pt current mental status is not allowing for much progression of his independence with ostomy care.  Ranya Fiddler [place], [place] [phone]

## 2012-09-12 NOTE — Progress Notes (Signed)
INITIAL NUTRITION ASSESSMENT  DOCUMENTATION CODES Per approved criteria  -Not Applicable   INTERVENTION:  Ensure Complete PO BID, each supplement provides 350 kcal and 13 grams of protein.  NUTRITION DIAGNOSIS: Inadequate oral intake related to recent abdominal surgery and decreased appetite as evidenced by poor intake of meals.   Goal: Intake to meet >90% of estimated nutrition needs.  Monitor:  PO intake, labs, weight trend.  Reason for Assessment: MST=3 (unsure of weight loss and poor intake)  62 y.o. male  Admitting Dx: Respiratory failure, post-operative  ASSESSMENT: Patient was transferred from [facility] on 09/02/2012 with abdominal pain with lactic acidosis, and found to have perforated sigmoid diverticulitis. 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 Developed respiratory distress and confusion 12/31>>transferred to SDU, placed on oxygen. Developed fever from PNA, VDRF 1/01 and transferred to ICU. Now in SDU, progressing, off cardizem.  Patient reports minimal intake since admission.  Seems confused. Remembers being on a liquid diet for a while.  Appetite is okay, but intake has been poor.  Patient did not like lunch meal.  Agreed to try Ensure to maximize oral intake.      Height: Ht Readings from Last 1 Encounters:  09/06/12 6' (1.829 m)    Weight: Wt Readings from Last 1 Encounters:  09/10/12 180 lb 1.9 oz (81.7 kg)    Ideal Body Weight: 80.9 kg  % Ideal Body Weight: 101%  Wt Readings from Last 10 Encounters:  09/10/12 180 lb 1.9 oz (81.7 kg)  09/10/12 180 lb 1.9 oz (81.7 kg)    Usual Body Weight: 165 lb per patient  % Usual Body Weight: 109%  BMI:  Body mass index is 24.43 kg/(m^2).  Estimated Nutritional Needs: Kcal: 2000-2200 Protein: 105-125 gm Fluid: 2-2.2 L  Skin: abdominal incision, skin tears on abdomen and left arm  Diet Order: Cardiac  EDUCATION NEEDS: -Education not appropriate at  this time   Intake/Output Summary (Last 24 hours) at 09/12/12 1519 Last data filed at 09/12/12 1030  Gross per 24 hour  Intake 1279.5 ml  Output   1625 ml  Net -345.5 ml   Last BM: Colostomy in place with watery stool   Labs:   Lab 09/12/12 0520 09/11/12 0437 09/10/12 0425 09/06/12 1747  NA 138 132* 133* --  K 3.1* 3.5 3.0* --  CL 102 96 95* --  CO2 28 29 31  --  BUN 11 8 6  --  CREATININE 1.84* 1.15 0.70 --  CALCIUM 8.4 8.3* 8.0* --  MG -- -- -- 1.1*  PHOS -- -- -- 1.8*  GLUCOSE 100* 99 96 --    CBG (last 3)   Basename 09/10/12 0812 09/10/12 09/09/12 1959  GLUCAP 62* 84 100*    Scheduled Meds:   . aspirin EC  81 mg Oral Daily  . diltiazem  60 mg Oral Q6H  . folic acid  1 mg Oral Daily  . heparin subcutaneous  5,000 Units Subcutaneous Q8H  . metoprolol tartrate  50 mg Oral Q12H  . nystatin cream   Topical BID  . pantoprazole  40 mg Oral Q1200  . piperacillin-tazobactam (ZOSYN)  IV  3.375 g Intravenous Q8H  . potassium chloride  20 mEq Oral BID  . prasugrel  10 mg Oral Daily  . thiamine  100 mg Oral Daily  . vancomycin  1,250 mg Intravenous Q24H    Continuous Infusions:   . 0.9 % NaCl with KCl 20 mEq / L 50 mL/hr (09/12/12  1030)    Past Medical History  Diagnosis Date  . A-fib     chronic afib - did not like coumadin  . Coronary artery disease     s/p PCI Sept 2013 with DES OM  . Diverticulitis with perforation     Past Surgical History  Procedure Date  . Cardiac catheterization   . Laparotomy 09/02/2012    Procedure: EXPLORATORY LAPAROTOMY;  Surgeon: Nada Libman, MD;  Location: [facility] OR;  Service: Vascular;  Laterality: N/A;  Exploratory Laparotomy with Sigmoid resection with end colocstomy.  . Colostomy revision 09/02/2012    Procedure: COLON RESECTION SIGMOID;  Surgeon: Nada Libman, MD;  Location: [facility] OR;  Service: Vascular;  Laterality: N/A;  Sigmoid resection with End Colostomy.    Joaquin Courts, RD, LDN, CNSC Pager# [phone] After  Hours Pager# [phone]

## 2012-09-12 NOTE — Progress Notes (Signed)
TELEMETRY: Reviewed telemetry pt in atrial fibrillation rate 70-80: Filed Vitals:   09/11/12 2027 09/11/12 2050 09/12/12 0036 09/12/12 0453  BP:  130/66 97/61 112/70  Pulse:   84 81  Temp:  98.5 F (36.9 C) 97.8 F (36.6 C) 98.5 F (36.9 C)  TempSrc:  Oral Oral Oral  Resp:   17 14  Height:      Weight:      SpO2: 91%  93% 94%    Intake/Output Summary (Last 24 hours) at 09/12/12 0642 Last data filed at 09/12/12 0501  Gross per 24 hour  Intake 1279.5 ml  Output   3875 ml  Net -2595.5 ml    SUBJECTIVE Patient confused. Wants to go home. Oriented to self only. Denies SOB.  LABS: Basic Metabolic Panel:  Basename 09/12/12 0520 09/11/12 0437  NA 138 132*  K 3.1* 3.5  CL 102 96  CO2 28 29  GLUCOSE 100* 99  BUN 11 8  CREATININE 1.84* 1.15  CALCIUM 8.4 8.3*  MG -- --  PHOS -- --   CBC:  Basename 09/12/12 0520 09/11/12 0437  WBC 13.9* 15.5*  NEUTROABS -- --  HGB 12.3* 12.9*  HCT 34.9* 36.7*  MCV 99.1 99.2  PLT 303 251   Radiology/Studies:  Dg Chest 2 View  09/11/2012  *RADIOLOGY REPORT*  Clinical Data: Shortness of breath, question pneumonia.  CHEST - 2 VIEW  Comparison: 09/09/2012  Findings: Decreasing right perihilar and lower lobe airspace disease.  Mild residual right basilar infiltrate.  Mild residual density in the medial left lung base as well, slightly improved. No effusions.  Heart is normal size.  No acute bony abnormality.  IMPRESSION: Improving bilateral infiltrates.   Original Report Authenticated By: Charlett Nose, M.D.    Dg Chest Port 1 View  09/09/2012  *RADIOLOGY REPORT*  Clinical Data: Respiratory failure, follow-up  PORTABLE CHEST - 1 VIEW  Comparison: Portable exam 0506 hours compared to 09/08/2012  Findings: Normal heart size, mediastinal contours, and pulmonary vascularity. Left subclavian line stable, tip projecting over SVC. Atherosclerotic calcification aorta. Bilateral perihilar to basilar infiltrates little changed. No pleural effusion or  pneumothorax.  IMPRESSION: No change in mild bilateral pulmonary infiltrates.   Original Report Authenticated By: Ulyses Southward, M.D.     PHYSICAL EXAM General: Well developed, well nourished, in no acute distress. Head: Normocephalic, atraumatic, sclera non-icteric, no xanthomas, nares are without discharge. Neck: Negative for carotid bruits. JVD not elevated. Lungs: bilateral coarse rhonchi. Heart: IRRR S1 S2 without murmurs, rubs, or gallops.  Abdomen: Soft, non-tender, non-distended. Extremities: 1-2+ edema.  Distal pedal pulses are 2+ and equal bilaterally. Neuro: Alert and oriented X 1. Moves all extremities spontaneously.Confused.   ASSESSMENT AND PLAN: 1. Permanent atrial fibrillation. Rate well controlled. Will DC digoxin given acute renal failure. Continue metoprolol. Transition diltiazem to po today. Not a candidate for anticoagulation due to history of Etoh abuse and noncompliance.  2. CAD s/p DES OM in Sept. 2013. Asymptomatic. On dual antiplatelet therapy with ASA and Effient.  3. 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  4. Sigmoid diverticulitis with perforation s/p signmoid colectomy and end colostomy.  5. Acute delirium secondary to DTs  6. Acute pulmonary edema. Slowly improving. I/O negative 2.5 liters yesterday.  7. Acute renal failure. Creatinine increased to 1.84 today. Etiology unclear. Was being diuresed but diuretics discontinued. Urine output good.   Principal Problem:  *Respiratory failure, post-operative Active Problems:  Atrial fibrillation  Arteriosclerotic cardiovascular disease (ASCVD)  Perforated sigmoid colon  Delirium  tremens  NSVT, 7 beat run 1/02  Anasarca  Pulmonary edema  Thrombocytopenia  Fever    Signed, Peter Swaziland MD,FACC 09/12/2012 6:50 AM

## 2012-09-12 NOTE — Progress Notes (Signed)
PULMONARY  / CRITICAL CARE MEDICINE  Name: Johnathan Bauer MRN: [id number] DOB: 1951/08/09    LOS: 10  REFERRING MD :  Abigail Miyamoto  CHIEF COMPLAINT:  Short of breath  BRIEF PATIENT DESCRIPTION:  62 yo male smoker transferred from [place] on 09/02/2012 with abdominal pain with lactic acidosis, and found to have perforated sigmoid diverticulitis.  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  Developed respiratory distress and confusion 12/31>>transferred to SDU, placed on oxygen and CIWA started.  Developed fever from PNA, VDRF 1/01 and transferred to ICU with PCCM consult.  Now out of ICU .  On nasal cannula and doing well except still in Afib rate controlled with dilt drip.   LINES / TUBES: ETT 1/01 >> 1/02 Lt [place] CVL 1/01 >>   CULTURES: Blood 1/01 >>Neg Sputum 1/01 >>candida  ANTIBIOTICS: Ancef 12/28>>12/28 Cipro 12/28>>1/01 Flagyl 12/28>>1/01 Vancomycin 1/01>> Zosyn 1/01>>  LEVEL OF CARE:  SDU PRIMARY SERVICE:  CCS CONSULTANTS:  PCCM CODE STATUS: Full DIET:  Clear liquid DVT Px:  SQ heparin GI Px:  Protonix  SIGNIFICANT EVENTS:  12/29 Laparotomy 1/01 Transfer ICU, PNA, DT's, VDRF 1/04 A fib with RVR >> add cardizem gtt 1/04 Echocardiogram>> mild LVH, EF 60 to 65%, mod Mitral prolapse, mild MR, mod LA dilation, PAS 34 mmhg 1/04 Cardizem gtt for a fib RVR   INTERVAL HISTORY:  09/13/11: Improved. Off cardizem. STill with some confusion and needing restraints. Working with PT. Per RN Primary service plans to keep in SDU one more day. No pulm issues other than pulmonary toileting needs  VITAL SIGNS: Temp:  [97.7 F (36.5 C)-99.3 F (37.4 C)] 98.7 F (37.1 C) (01/07 0745) Pulse Rate:  [64-84] 82  (01/07 1024) Resp:  [14-20] 14  (01/07 0453) BP: (97-139)/(61-87) 121/67 mmHg (01/07 0745) SpO2:  [91 %-100 %] 97 % (01/07 0915) INTAKE / OUTPUT: Intake/Output      01/06 0701 - 01/07 0700 01/07 0701 - 01/08 0700   P.O. 480     I.V. (mL/kg) 249.5 (3.1)    IV Piggyback 550    Total Intake(mL/kg) 1279.5 (15.7)    Urine (mL/kg/hr) 2075 (1.1) 400 (1.1)   Stool 1300    Total Output 3375 400   Net -2095.5 -400          PHYSICAL EXAMINATION: General: NAD Neuro: Intermittently confused, follows commands (at time of MD exasm was able to give reasonable history) HEENT: WNL Cardiovascular:  irregular Lungs: scattered rhonchi - improved Abdomen:  Wound dressing clean, soft, decreased BS, ostomy pink Musculoskeletal:  Severe bilat UE and bilat LE edema Skin: warm, no rashes   LABS: Cbc  Lab 09/12/12 0520 09/11/12 0437 09/10/12 0425  WBC 13.9* -- --  HGB 12.3* 12.9* 12.8*  HCT 34.9* 36.7* 36.4*  PLT 303 251 216    Chemistry   Lab 09/12/12 0520 09/11/12 0437 09/10/12 0425 09/06/12 1747  NA 138 132* 133* --  K 3.1* 3.5 3.0* --  CL 102 96 95* --  CO2 28 29 31  --  BUN 11 8 6  --  CREATININE 1.84* 1.15 0.70 --  CALCIUM 8.4 8.3* 8.0* --  MG -- -- -- 1.1*  PHOS -- -- -- 1.8*  GLUCOSE 100* 99 96 --    Liver fxn  Lab 09/06/12 1747  AST 23  ALT 12  ALKPHOS 78  BILITOT 2.6*  PROT 4.8*  ALBUMIN 2.3*   coags  Lab 09/06/12 1747  APTT 35  INR 1.28  Sepsis markers  Lab 09/06/12 1748  LATICACIDVEN 1.5  PROCALCITON --   ABG  Lab 09/06/12 1725  PHART 7.464*  PCO2ART 39.8  PO2ART 69.0*  HCO3 28.2*  TCO2 29    CBG trend  Lab 09/10/12 0812 09/10/12 09/09/12 1959 09/09/12 1543 09/09/12 1145  GLUCAP 62* 84 100* 169* 102*     CXR:  1/6: improved infiltrates Dg Chest 2 View  09/11/2012  *RADIOLOGY REPORT*  Clinical Data: Shortness of breath, question pneumonia.  CHEST - 2 VIEW  Comparison: 09/09/2012  Findings: Decreasing right perihilar and lower lobe airspace disease.  Mild residual right basilar infiltrate.  Mild residual density in the medial left lung base as well, slightly improved. No effusions.  Heart is normal size.  No acute bony abnormality.  IMPRESSION: Improving bilateral  infiltrates.   Original Report Authenticated By: Charlett Nose, M.D.     Lab 09/12/12 0520 09/11/12 0437 09/10/12 0425 09/09/12 0446 09/08/12 0345 09/06/12 1747  NA 138 132* 133* 135 133* --  K 3.1* 3.5 -- -- -- --  CL 102 96 95* 97 97 --  CO2 28 29 31 29 28  --  GLUCOSE 100* 99 96 117* 90 --  BUN 11 8 6 10 11  --  CREATININE 1.84* 1.15 0.70 0.74 0.90 --  CALCIUM 8.4 8.3* 8.0* 7.6* 7.9* --  MG -- -- -- -- -- 1.1*  PHOS -- -- -- -- -- 1.8*     Lab 09/09/12 0446 09/08/12 0345  PROBNP 3020.0* 4843.0*       ASSESSMENT Respiratory failure, post-operative 2nd to PNA and Pulmonary edema  Continue zosyn>>  PCCM ok with dc vanc if ok with trauma          Perforated sigmoid colon  S/P laparotomy and sigmoid colon resection 12/29  Post op mgmt per CCS   Delirium tremens  PRN ativan for CIWA > 8   Pulmonary edema improved  No prior history of CHF documented. ECHO 09/10/11 - normal. So suspect diast dysfn  CXR pattern more suggestive of edema than PNA>>improved 1/6   check bnp 09/14/11  REst per cards   Atrial fibrillation with RVR>>now controlled  Mitral valve prolapse  h/o CAD  Continue digoxin   Cont oral lopressor  Wean off cardizem gtt as tolerated  Cardiology consultation request noted   NSVT - 7 beat run 1/02  None further   Anasarca, likely due to post op volume resuscitation   Thrombocytopenia >> resolved  Likely from sepsis, ETOH.  Resume SQ heparin 1/04    Hypokalemia  F/u and replace as needed  OOB to chair.  PT consulted. D/c Foley.  Transfer to tele once off per Trauma  PCCM  wil sign off

## 2012-09-12 NOTE — Consult Note (Deleted)
WOC ostomy follow up Stoma type/location: LLQ, end stoma Stomal assessment/size: 1 3/4" round, budded, pale, moist Peristomal assessment: no pouch change today Output liquid brown, + flatus Ostomy pouching: 2pc. 2 1/4" in place, intact Education provided: met with pt and wife; explained creation of the stoma and that pt did her pouch change independently.  Will plan pouch change with pt and his wife in the am.   Armen Pickup RN,CWOCN [phone]

## 2012-09-12 NOTE — Progress Notes (Signed)
ANTIBIOTIC CONSULT NOTE - FOLLOW UP  Pharmacy Consult for vancomycin  Indication: HCAP  No Known Allergies  Patient Measurements: Height: 6' (182.9 cm) Weight: 180 lb 1.9 oz (81.7 kg) IBW/kg (Calculated) : 77.6    Vital Signs: Temp: 98.1 F (36.7 C) (01/07 1220) Temp src: Oral (01/07 1220) BP: 124/83 mmHg (01/07 1243) Pulse Rate: 82  (01/07 1024) Intake/Output from previous day: 01/06 0701 - 01/07 0700 In: 1279.5 [P.O.:480; I.V.:249.5; IV Piggyback:550] Out: 3375 [Urine:2075; Stool:1300] Intake/Output from this shift: Total I/O In: -  Out: 400 [Urine:400]  Labs:  [facility] 09/12/12 0520 09/11/12 0437 09/10/12 0425  WBC 13.9* 15.5* 13.7*  HGB 12.3* 12.9* 12.8*  PLT 303 251 216  LABCREA -- -- --  CREATININE 1.84* 1.15 0.70   Estimated Creatinine Clearance: 46.3 ml/min (by C-G formula based on Cr of 1.84). No results found for this basename: VANCOTROUGH:2,VANCOPEAK:2,VANCORANDOM:2,GENTTROUGH:2,GENTPEAK:2,GENTRANDOM:2,TOBRATROUGH:2,TOBRAPEAK:2,TOBRARND:2,AMIKACINPEAK:2,AMIKACINTROU:2,AMIKACIN:2, in the last 72 hours    Assessment: 62 yo male with HCAP is currently on day #6 Vancomycin and Zosyn.  He is s/p emergent laparotomy with sigmoid colon resection, and end colostomy 09/03/12. Developed respiratory distress and confusion 12/31.  Started on Vanc and zosyn on 09/06/12 for PNA and fever. Patient is afebrile and WBC still elevated at 13.9.  SCr is 1.84 and up from 1.15 and previously = 0.9. CrCl ~ 75 ml/min.    Blood Cultures x 2 on 09/06/12 = NGTD Resp Cx on 09/07/12  =Candida albicans MRSA PCR screen = Negative  Goal of Therapy:  Vancomycin trough level 15-20 mcg/ml  Plan:  -Change vancomycin to 1250mg  IV q24hr -Could consider d/c vancomycin -Will recheck a vancomycin trough at steady state  Harland German, Pharm D 09/12/2012 1:29 PM

## 2012-09-12 NOTE — Progress Notes (Addendum)
Clinical Child psychotherapist (CSW) observed that PT recommending HH 24hr care/SNF. Pt presents confused and no family present in the room. CSW tried to reach pt wife however phone rang busy. CSW informed by RN that pt wife works 12hr shifts and will contact CSW tomorrow to discuss discharge options.  CSW has left a message for the financial counselor regarding a Medicaid application being completed for SNF placement.  Theresia Bough, MSW, Theresia Majors [phone]

## 2012-09-13 ENCOUNTER — Inpatient Hospital Stay (HOSPITAL_COMMUNITY): Payer: Managed Care, Other (non HMO)

## 2012-09-13 DIAGNOSIS — I7090 Unspecified atherosclerosis: Secondary | ICD-10-CM

## 2012-09-13 DIAGNOSIS — N179 Acute kidney failure, unspecified: Secondary | ICD-10-CM | POA: Diagnosis not present

## 2012-09-13 DIAGNOSIS — I2510 Atherosclerotic heart disease of native coronary artery without angina pectoris: Secondary | ICD-10-CM

## 2012-09-13 LAB — URINE MICROSCOPIC-ADD ON

## 2012-09-13 LAB — BASIC METABOLIC PANEL
BUN: 13 mg/dL (ref 6–23)
Chloride: 102 mEq/L (ref 96–112)
Creatinine, Ser: 2.19 mg/dL — ABNORMAL HIGH (ref 0.50–1.35)
Glucose, Bld: 103 mg/dL — ABNORMAL HIGH (ref 70–99)
Potassium: 3.6 mEq/L (ref 3.5–5.1)

## 2012-09-13 LAB — CULTURE, BLOOD (ROUTINE X 2): Culture: NO GROWTH

## 2012-09-13 LAB — URINALYSIS, ROUTINE W REFLEX MICROSCOPIC
Glucose, UA: NEGATIVE mg/dL
Ketones, ur: NEGATIVE mg/dL
Leukocytes, UA: NEGATIVE
Specific Gravity, Urine: 1.01 (ref 1.005–1.030)
pH: 7 (ref 5.0–8.0)

## 2012-09-13 LAB — CBC
HCT: 35.2 % — ABNORMAL LOW (ref 39.0–52.0)
Hemoglobin: 12.5 g/dL — ABNORMAL LOW (ref 13.0–17.0)
MCV: 98.3 fL (ref 78.0–100.0)
WBC: 9.7 10*3/uL (ref 4.0–10.5)

## 2012-09-13 MED ORDER — FUROSEMIDE 10 MG/ML IJ SOLN
40.0000 mg | Freq: Once | INTRAMUSCULAR | Status: AC
  Administered 2012-09-13: 40 mg via INTRAVENOUS
  Filled 2012-09-13: qty 4

## 2012-09-13 MED ORDER — FUROSEMIDE 10 MG/ML IJ SOLN
INTRAMUSCULAR | Status: AC
  Filled 2012-09-13: qty 4

## 2012-09-13 MED ORDER — DILTIAZEM HCL ER COATED BEADS 180 MG PO CP24
180.0000 mg | ORAL_CAPSULE | Freq: Every day | ORAL | Status: DC
  Administered 2012-09-13 – 2012-09-19 (×7): 180 mg via ORAL
  Filled 2012-09-13 (×7): qty 1

## 2012-09-13 MED ORDER — PNEUMOCOCCAL VAC POLYVALENT 25 MCG/0.5ML IJ INJ
0.5000 mL | INJECTION | Freq: Once | INTRAMUSCULAR | Status: AC
  Administered 2012-09-13: 0.5 mL via INTRAMUSCULAR
  Filled 2012-09-13: qty 0.5

## 2012-09-13 NOTE — Clinical Social Work Placement (Addendum)
    Clinical Social Work Department CLINICAL SOCIAL WORK PLACEMENT NOTE 09/13/2012  Patient:  [name], [name]  Account Number:  [id number] Admit date:  09/02/2012  Clinical Social Worker:  Theresia Bough, Theresia Majors  Date/time:  09/13/2012 03:10 PM  Clinical Social Work is seeking post-discharge placement for this patient at the following level of care:   SKILLED NURSING   (*CSW will update this form in Epic as items are completed)   09/13/2012  Patient/family provided with [facility] Department of Clinical Social Work's list of facilities offering this level of care within the geographic area requested by the patient (or if unable, by the patient's family).  09/13/2012  Patient/family informed of their freedom to choose among providers that offer the needed level of care, that participate in Medicare, Medicaid or managed care program needed by the patient, have an available bed and are willing to accept the patient.  09/13/2012  Patient/family informed of [facility]' ownership interest in [facility], as well as of the fact that they are under no obligation to receive care at this facility.  PASARR submitted to EDS on 09/13/2012 PASARR number received from [employer] on 09/13/2012  FL2 transmitted to all facilities in geographic area requested by pt/family on  09/13/2012 FL2 transmitted to all facilities within larger geographic area on   Patient informed that his/her managed care company has contracts with or will negotiate with  certain facilities, including the following:     Patient/family informed of bed offers received:  09/16/11 Patient chooses bed at [facility] Physician recommends and patient chooses bed at    Patient to be transferred to [facility] on 09-19-12   Patient to be transferred to facility by [facility]  The following physician request were entered in Epic:   Additional Comments:

## 2012-09-13 NOTE — Progress Notes (Signed)
TELEMETRY: Reviewed telemetry pt in atrial fibrillation rate 70-80: Filed Vitals:   09/12/12 1810 09/12/12 2000 09/12/12 2317 09/13/12 0322  BP: 126/75 132/72 125/83 138/83  Pulse:  94    Temp:  98.1 F (36.7 C) 98.6 F (37 C) 98.2 F (36.8 C)  TempSrc:  Oral Oral Oral  Resp:   18 21  Height:      Weight:      SpO2:  97% 97% 98%    Intake/Output Summary (Last 24 hours) at 09/13/12 0800 Last data filed at 09/13/12 0600  Gross per 24 hour  Intake 2110.83 ml  Output   1800 ml  Net 310.83 ml    SUBJECTIVE Patient confused. Not oriented this am. Pulled off colostomy and now in restraints. Appears more agitated and SOB.  LABS: Basic Metabolic Panel:  Basename 09/13/12 0520 09/12/12 0520  NA 138 138  K 3.6 3.1*  CL 102 102  CO2 24 28  GLUCOSE 103* 100*  BUN 13 11  CREATININE 2.19* 1.84*  CALCIUM 8.2* 8.4  MG -- --  PHOS -- --   CBC:  Basename 09/13/12 0520 09/12/12 0520  WBC 9.7 13.9*  NEUTROABS -- --  HGB 12.5* 12.3*  HCT 35.2* 34.9*  MCV 98.3 99.1  PLT 382 303   BNP 6602   Radiology/Studies:  Dg Chest 2 View  09/11/2012  *RADIOLOGY REPORT*  Clinical Data: Shortness of breath, question pneumonia.  CHEST - 2 VIEW  Comparison: 09/09/2012  Findings: Decreasing right perihilar and lower lobe airspace disease.  Mild residual right basilar infiltrate.  Mild residual density in the medial left lung base as well, slightly improved. No effusions.  Heart is normal size.  No acute bony abnormality.  IMPRESSION: Improving bilateral infiltrates.   Original Report Authenticated By: Charlett Nose, M.D.    Dg Chest Port 1 View  09/09/2012  *RADIOLOGY REPORT*  Clinical Data: Respiratory failure, follow-up  PORTABLE CHEST - 1 VIEW  Comparison: Portable exam 0506 hours compared to 09/08/2012  Findings: Normal heart size, mediastinal contours, and pulmonary vascularity. Left subclavian line stable, tip projecting over SVC. Atherosclerotic calcification aorta. Bilateral perihilar to  basilar infiltrates little changed. No pleural effusion or pneumothorax.  IMPRESSION: No change in mild bilateral pulmonary infiltrates.   Original Report Authenticated By: Ulyses Southward, M.D.     PHYSICAL EXAM General: Well developed, well nourished, agitated and confused. Head: Normocephalic, atraumatic, sclera non-icteric, no xanthomas, nares are without discharge. Neck: Negative for carotid bruits. JVD elevated Lungs: bilateral coarse rhonchi. Respiratory rate increased to 32/min Heart: IRRR S1 S2 without murmurs, rubs, or gallops.  Abdomen: Soft, non-tender, non-distended. Extremities: 2+ edema.  Distal pedal pulses are 2+ and equal bilaterally. Neuro: Alert and oriented X 1. Moves all extremities spontaneously.Confused.   ASSESSMENT AND PLAN: 1. Permanent atrial fibrillation. Rate well controlled. Digoxin discontinued due to ARF. Continue po metoprolol and diltiazem. Not a candidate for anticoagulation due to history of Etoh abuse and noncompliance.  2. CAD s/p DES OM in Sept. 2013. Asymptomatic. On dual antiplatelet therapy with ASA and Effient.  3. 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  4. Sigmoid diverticulitis with perforation s/p signmoid colectomy and end colostomy.  5. Acute delirium secondary to DTs  6. Acute pulmonary edema. Appears volume overloaded today with increased respiratory rate, increased congestion and edema on exam and elevated BNP. Will repeat CXR today. Give Lasix 40 mg IV now.  7. Acute renal failure. Creatinine increased 0.9>1.15>1.84>2.19. Urine output maintained. Etiology unclear. Will check UA.  Principal Problem:  *Respiratory failure, post-operative Active Problems:  Atrial fibrillation  Arteriosclerotic cardiovascular disease (ASCVD)  Perforated sigmoid colon  Delirium tremens  NSVT, 7 beat run 1/02  Anasarca  Pulmonary edema  Thrombocytopenia  Fever    Signed, Peter Swaziland MD,FACC 09/13/2012 8:00 AM

## 2012-09-13 NOTE — Progress Notes (Signed)
Physical Therapy Treatment Patient Details Name: Johnathan Bauer MRN: [id number] DOB: December 20, 1950 Today's Date: 09/13/2012 Time: 4098-1191 PT Time Calculation (min): 33 min  PT Assessment / Plan / Recommendation Comments on Treatment Session  Pt increase assistance needed due to increase LE weakness and noticeable wheezing  and increase SOB with activity.  RR increased to 34 with short ambulation distance.  RN aware of increase SOB with activity and CT chest read  Decreasing right perihilar and lower lobe airspace.  At this time recommending further therapy prior to d/c home due slow progression made and 24 hour assitance not available at d/c.    Follow Up Recommendations  SNF     Equipment Recommendations  Rolling walker with 5" wheels    Recommendations for Other Services    Frequency Min 3X/week   Plan Discharge plan needs to be updated;Frequency remains appropriate    Precautions / Restrictions Precautions Precautions: Fall   Pertinent Vitals/Pain No c/o pain    Mobility  Bed Mobility Bed Mobility: Not assessed Supine to Sit: Not tested (comment) Transfers Transfers: Sit to Stand;Stand to Sit Sit to Stand: 1: +2 Total assist;From chair/3-in-1 Sit to Stand: Patient Percentage: 60% Stand to Sit: 1: +2 Total assist;To chair/3-in-1 Stand to Sit: Patient Percentage: 60% Details for Transfer Assistance: +2 (A) to initiate transfer and slowly descend for safety and due to severe posterior lean.  Max cues for hand and LE placement.  Pt tends to keep hands on RW during transfer.  Pt also tends to sit without body incorrect position. Ambulation/Gait Ambulation/Gait Assistance: 1: +2 Total assist Ambulation/Gait: Patient Percentage: 70% Ambulation Distance (Feet): 12 Feet (x2) Assistive device: Rolling walker Ambulation/Gait Assistance Details: Limited ambulation due to increase bilateral LE weakness and increase SOB and RR with ambulation.  RR increased to 35.  Max cues for proper  breathing technique. Gait Pattern: Step-through pattern;Decreased stride length;Shuffle;Trunk flexed Stairs: No Wheelchair Mobility Wheelchair Mobility: No    Exercises General Exercises - Lower Extremity Gluteal Sets: Strengthening;10 reps Long Arc Quad: AROM;Strengthening;Both;10 reps;Seated Hip Flexion/Marching: AROM;Strengthening;Both;10 reps;Seated   PT Diagnosis:    PT Problem List:   PT Treatment Interventions:     PT Goals Acute Rehab PT Goals PT Goal Formulation: With patient Time For Goal Achievement: 09/24/12 Potential to Achieve Goals: Good Pt will go Sit to Stand: with supervision PT Goal: Sit to Stand - Progress: Not met Pt will go Stand to Sit: with supervision PT Goal: Stand to Sit - Progress: Not met Pt will Transfer Bed to Chair/Chair to Bed: with supervision PT Transfer Goal: Bed to Chair/Chair to Bed - Progress: Not met Pt will Ambulate: >150 feet;with supervision;with least restrictive assistive device;Other (comment) PT Goal: Ambulate - Progress: Not met  Visit Information  Last PT Received On: 09/13/12 Assistance Needed: +2    Subjective Data  Subjective: "I'm at [facility]." Patient Stated Goal: to go home   Cognition  Overall Cognitive Status: Impaired Area of Impairment: Safety/judgement;Following commands;Problem solving;Awareness of deficits;Memory Arousal/Alertness: Awake/alert Orientation Level: Disoriented to;Place;Time Behavior During Session:  (Hallucinations at time) Memory:  (short term memory) Following Commands: Follows multi-step commands with increased time Safety/Judgement: Decreased awareness of safety precautions;Decreased safety judgement for tasks assessed;Impulsive Safety/Judgement - Other Comments: Imparied overall safety and judgement Problem Solving: Pt slow to process and needs extra time to complete task.    Balance  Balance Balance Assessed: Yes Static Standing Balance Static Standing - Balance Support: Bilateral  upper extremity supported Static Standing - Level of Assistance: 4:  Min assist Static Standing - Comment/# of Minutes: (A) to maintain balance due to posterior lean  End of Session PT - End of Session Equipment Utilized During Treatment: Gait belt (oxygen d/c) Activity Tolerance: Patient limited by fatigue Patient left: in chair;with call bell/phone within reach Nurse Communication: Mobility status   GP     Holman Bonsignore 09/13/2012, 3:32 PM Jake Shark, PT DPT [phone]

## 2012-09-13 NOTE — Clinical Social Work Psychosocial (Signed)
     Clinical Social Work Department BRIEF PSYCHOSOCIAL ASSESSMENT 09/13/2012  Patient:  Johnathan Bauer, Johnathan Bauer     Account Number:  [id number]     Admit date:  09/02/2012  Clinical Social Worker:  Lourdes Sledge  Date/Time:  09/13/2012 02:58 PM  Referred by:  CSW  Date Referred:  09/13/2012 Referred for  SNF Placement   Other Referral:   Interview type:  Family Other interview type:   CSW completed assessment with pt wife Johnathan Bauer [phone]    PSYCHOSOCIAL DATA Living Status:  WIFE Admitted from facility:   Level of care:   Primary support name:  Johnathan Bauer [phone] Primary support relationship to patient:  SPOUSE Degree of support available:   Pt spouse actively involved in pt care. Spouse works 12hr shifts and is unable to be home 24hrs a day with pt.    CURRENT CONCERNS Current Concerns  Post-Acute Placement   Other Concerns:    SOCIAL WORK ASSESSMENT / PLAN CSW observed that PT is recommending HH 24hr care vs. SNF.    CSW unable to complete assessment with pt who presents confused. CSW tried to reach pt wife over the phone multiple times. CSW was finally able to meet with pt wife face to face outside of pt room and was able to receive updated contacted information for pt home number and wife cell number.    CSW informed pt wife of PT recommendations and explored pt wife ability to provide 24hr care at dc. Pt wife stated she works 12hr shifts and is therefore unable to stay home with pt. Pt wife denied pt having any other family members who can stay home with pt. CSW explored whether pt has any insurance as CSW did not see any record of insurance in [place]. 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 CSW informed pt wife of SNF options for placement as pt will need therapy and supervision at discharge. Pt wife stated she agrees pt needs extra care upon discharge and would  therefore speak to pt daughters regarding placement.    CSW inquired about pt substance abuse history. Pt wife stated she believed pt has been drinking since his college years. Pt wife stated she initially did not think he had "a drinking problem" however states that she was informed by pt daughters that pt actually drinkins vodka in addition to his 2 beers a day. Wife did not know approximately how much vodka pt drinks however agrees that pt seems to have a "drinking problem."    CSW has received consent to fax pt out to SNF in [place].   Assessment/plan status:  Psychosocial Support/Ongoing Assessment of Needs Other assessment/ plan:   Information/referral to community resources:   CSW has provided pt wife with a SNF list to facilities in [place] and [place]. CSW also provided pt wife with CSW contact information and explained that [facility] would need a copy of pt insurance card. Pt wife plans to bring pt card to the hospital tomorrow.    PATIENTS/FAMILYS RESPONSE TO PLAN OF CARE: Pt still presents confused and unable to provide accurate information. Assessment was completed with pt wife who is agreeable to SNF placement at discharge. Wife will confirm dc plans with pt daughters and follow up with CSW.

## 2012-09-13 NOTE — Progress Notes (Signed)
Patient ID: Johnathan Bauer, male   DOB: 1951-03-11, 62 y.o.   MRN: [id number] Increased WOB.  WIll cancel transfer. .betssi

## 2012-09-13 NOTE — Progress Notes (Signed)
No family present in pt room and pt still presents confused. CSW has completed an FL2, submitted for a pasarr and contacted the financial counselor in the instance that pt needs SNF placement. CSW still awaiting to speak to pt wife to explore the amount of support pt wife is able to provide at discharge.  Full assessment to follow.  Theresia Bough, MSW, Theresia Majors [phone]

## 2012-09-13 NOTE — Progress Notes (Addendum)
11 Days Post-Op  Subjective: Doing better per patient.  Haldol made him more confused per RN.  Got lasix per Cardiology. Objective: Vital signs in last 24 hours: Temp:  [98.1 F (36.7 C)-99 F (37.2 C)] 98.3 F (36.8 C) (01/08 0727) Pulse Rate:  [82-94] 94  (01/07 2000) Resp:  [18-23] 23  (01/08 0727) BP: (124-144)/(72-85) 144/85 mmHg (01/08 0727) SpO2:  [92 %-98 %] 92 % (01/08 0727) Last BM Date:  (colostomy)  Intake/Output from previous day: 01/07 0701 - 01/08 0700 In: 2615 [P.O.:1640; I.V.:625; IV Piggyback:350] Out: 1850 [Urine:1450; Stool:400] Intake/Output this shift: Total I/O In: 723.2 [P.O.:240; I.V.:479.2; IV Piggyback:4] Out: 800 [Urine:800]  General appearance: alert and cooperative Resp: clear after cough Cardio: irregularly irregular rhythm GI: soft, some drainage from wound openings - WTD shanged  Lab Results:   [facility] 09/13/12 0520 09/12/12 0520  WBC 9.7 13.9*  HGB 12.5* 12.3*  HCT 35.2* 34.9*  PLT 382 303   BMET  Basename 09/13/12 0520 09/12/12 0520  NA 138 138  K 3.6 3.1*  CL 102 102  CO2 24 28  GLUCOSE 103* 100*  BUN 13 11  CREATININE 2.19* 1.84*  CALCIUM 8.2* 8.4   PT/INR No results found for this basename: LABPROT:2,INR:2 in the last 72 hours ABG No results found for this basename: PHART:2,PCO2:2,PO2:2,HCO3:2 in the last 72 hours  Studies/Results: Dg Chest Port 1 View  09/13/2012  *RADIOLOGY REPORT*  Clinical Data: Respiratory distress.  PORTABLE CHEST - 1 VIEW  Comparison: 09/11/2012.  Findings: Trachea is midline.  Heart size normal.  Mild bibasilar air space disease persists.  Lungs are otherwise clear.  No pleural fluid.  IMPRESSION: Persistent bibasilar air space disease.   Original Report Authenticated By: Leanna Battles, M.D.     Anti-infectives: Anti-infectives     Start     Dose/Rate Route Frequency Ordered Stop   09/13/12 0501   vancomycin (VANCOCIN) 1,250 mg in sodium chloride 0.9 % 250 mL IVPB        1,250 mg 166.7  mL/hr over 90 Minutes Intravenous Every 24 hours 09/12/12 1330     09/06/12 1700   vancomycin (VANCOCIN) 1,250 mg in sodium chloride 0.9 % 250 mL IVPB  Status:  Discontinued        1,250 mg 166.7 mL/hr over 90 Minutes Intravenous Every 12 hours 09/06/12 1641 09/12/12 1330   09/06/12 1700  piperacillin-tazobactam (ZOSYN) IVPB 3.375 g       3.375 g 12.5 mL/hr over 240 Minutes Intravenous 3 times per day 09/06/12 1641     09/04/12 2300   ertapenem (INVANZ) 1 g in sodium chloride 0.9 % 50 mL IVPB  Status:  Discontinued        1 g 100 mL/hr over 30 Minutes Intravenous  Once 09/03/12 0200 09/03/12 0240   09/03/12 0600   ceFAZolin (ANCEF) IVPB 1 g/50 mL premix  Status:  Discontinued     Comments: Send with pt to OR      1 g 100 mL/hr over 30 Minutes Intravenous On call 09/02/12 2140 09/02/12 2148   09/03/12 0600   ceFAZolin (ANCEF) IVPB 2 g/50 mL premix     Comments: Send with pt to OR      2 g 100 mL/hr over 30 Minutes Intravenous On call 09/02/12 2148 09/02/12 2212   09/03/12 0400   ciprofloxacin (CIPRO) IVPB 400 mg  Status:  Discontinued        400 mg 200 mL/hr over 60 Minutes Intravenous 2 times daily  09/03/12 0241 09/06/12 1607   09/03/12 0400   metroNIDAZOLE (FLAGYL) IVPB 500 mg  Status:  Discontinued        500 mg 100 mL/hr over 60 Minutes Intravenous 3 times per day 09/03/12 0241 09/06/12 1607   09/03/12 0045   ciprofloxacin (CIPRO) IVPB 400 mg  Status:  Discontinued        400 mg 200 mL/hr over 60 Minutes Intravenous Every 12 hours 09/03/12 0035 09/03/12 0235   09/03/12 0045   metroNIDAZOLE (FLAGYL) IVPB 500 mg  Status:  Discontinued        500 mg 100 mL/hr over 60 Minutes Intravenous Every 8 hours 09/03/12 0035 09/03/12 0235          Assessment/Plan: s/p Procedure(s) (LRB) with comments: EXPLORATORY LAPAROTOMY (N/A) - Exploratory Laparotomy with Sigmoid resection with end colocstomy. COLON RESECTION SIGMOID (N/A) - Sigmoid resection with End Colostomy. S/P hartmans  for perf diverticulitis POD#11 - bowel function returned ID - D/C ABX, CXs neg Deconditioning - PT/OT AKI - got Lasix this AM per cardiology.  If continues to rise will need renal eval.  D/Cing Vanc also Hypokalemia - better ETOH WD - CIWA Delirium at night - will have better night and day cues on floor Haldol PRN VTE - heparin A fib - appreciate cardiology F/U. To 6N tele bed   LOS: 11 days    Willmar Stockinger E 09/13/2012

## 2012-09-13 NOTE — Consult Note (Signed)
WOC ostomy follow up Stoma type/location:  LLQ, end colostomy Stomal assessment/size: 1 3/4" round, budded from the skin, pink and moist Peristomal assessment: intact without issues Treatment options for stomal/peristomal skin: none needed Output liquid green Ostomy pouching: 2pc. 2 3/4" placed today Education provided: demonstrated pouch change to pt and wife today.  Pt seems a little more clear today and is able to tell me some of the parts of the pouch change.  Wife observed me complete pouch change.  Think plans for rehab then to home.  I would suggest HHRN at time of dc from rehab if possible to continue to support this pt and his wife.  Will have wife cut new wafer tom and attach pouch to wafer and practice lock and roll closure even if pouch change not due tom, will proceed with her practice since she is only here at limited times during the day and week.  WOC will continue to follow for ostomy care and teaching. Shakeema Lippman Renningers RN,CWOCN [phone]

## 2012-09-14 ENCOUNTER — Inpatient Hospital Stay (HOSPITAL_COMMUNITY): Payer: Managed Care, Other (non HMO)

## 2012-09-14 LAB — BASIC METABOLIC PANEL
Calcium: 8.8 mg/dL (ref 8.4–10.5)
GFR calc Af Amer: 29 mL/min — ABNORMAL LOW (ref >90)
GFR calc non Af Amer: 25 mL/min — ABNORMAL LOW (ref >90)
Potassium: 3.7 mEq/L (ref 3.5–5.1)
Sodium: 139 mEq/L (ref 135–145)

## 2012-09-14 LAB — CBC
Hemoglobin: 12.4 g/dL — ABNORMAL LOW (ref 13.0–17.0)
MCH: 34.8 pg — ABNORMAL HIGH (ref 26.0–34.0)
MCHC: 34.6 g/dL (ref 30.0–36.0)
Platelets: 477 10*3/uL — ABNORMAL HIGH (ref 150–400)
RDW: 13.9 % (ref 11.5–15.5)

## 2012-09-14 LAB — SODIUM, URINE, RANDOM: Sodium, Ur: 103 mEq/L

## 2012-09-14 LAB — CREATININE, URINE, RANDOM: Creatinine, Urine: 45.88 mg/dL

## 2012-09-14 MED ORDER — SODIUM CHLORIDE 0.9 % IJ SOLN
INTRAMUSCULAR | Status: AC
  Administered 2012-09-14: 10 mL
  Filled 2012-09-14: qty 10

## 2012-09-14 NOTE — Progress Notes (Addendum)
12 Days Post-Op  Subjective: confused  Objective: Vital signs in last 24 hours: Temp:  [97.3 F (36.3 C)-98.9 F (37.2 C)] 98.9 F (37.2 C) (01/09 0713) Pulse Rate:  [71-97] 97  (01/09 0713) Resp:  [20-31] 20  (01/09 0713) BP: (130-161)/(75-97) 161/78 mmHg (01/09 0713) SpO2:  [92 %-96 %] 95 % (01/09 0713) Last BM Date:  (colostomy)  Intake/Output from previous day: 01/08 0701 - 01/09 0700 In: 2093.2 [P.O.:1560; I.V.:479.2; IV Piggyback:54] Out: 3900 [Urine:3900] Intake/Output this shift: Total I/O In: 240 [P.O.:240] Out: -   General appearance: no distress Resp: upper airway congestion, some rales and wheeze Cardio: irregularly irregular rhythm GI: wounds repacked, +BS, good output from stoma EXT: BLE pitting edema  Lab Results:   [facility] 09/14/12 0450 09/13/12 0520  WBC 13.2* 9.7  HGB 12.4* 12.5*  HCT 35.8* 35.2*  PLT 477* 382   BMET  Basename 09/14/12 0450 09/13/12 0520  NA 139 138  K 3.7 3.6  CL 102 102  CO2 27 24  GLUCOSE 115* 103*  BUN 17 13  CREATININE 2.56* 2.19*  CALCIUM 8.8 8.2*   PT/INR No results found for this basename: LABPROT:2,INR:2 in the last 72 hours ABG No results found for this basename: PHART:2,PCO2:2,PO2:2,HCO3:2 in the last 72 hours  Studies/Results: Dg Chest Port 1 View  09/13/2012  *RADIOLOGY REPORT*  Clinical Data: Respiratory distress.  PORTABLE CHEST - 1 VIEW  Comparison: 09/11/2012.  Findings: Trachea is midline.  Heart size normal.  Mild bibasilar air space disease persists.  Lungs are otherwise clear.  No pleural fluid.  IMPRESSION: Persistent bibasilar air space disease.   Original Report Authenticated By: Leanna Battles, M.D.     Anti-infectives: Anti-infectives     Start     Dose/Rate Route Frequency Ordered Stop   09/13/12 0501   vancomycin (VANCOCIN) 1,250 mg in sodium chloride 0.9 % 250 mL IVPB  Status:  Discontinued        1,250 mg 166.7 mL/hr over 90 Minutes Intravenous Every 24 hours 09/12/12 1330 09/13/12 1351    09/06/12 1700   vancomycin (VANCOCIN) 1,250 mg in sodium chloride 0.9 % 250 mL IVPB  Status:  Discontinued        1,250 mg 166.7 mL/hr over 90 Minutes Intravenous Every 12 hours 09/06/12 1641 09/12/12 1330   09/06/12 1700   piperacillin-tazobactam (ZOSYN) IVPB 3.375 g  Status:  Discontinued        3.375 g 12.5 mL/hr over 240 Minutes Intravenous 3 times per day 09/06/12 1641 09/13/12 1351   09/04/12 2300   ertapenem (INVANZ) 1 g in sodium chloride 0.9 % 50 mL IVPB  Status:  Discontinued        1 g 100 mL/hr over 30 Minutes Intravenous  Once 09/03/12 0200 09/03/12 0240   09/03/12 0600   ceFAZolin (ANCEF) IVPB 1 g/50 mL premix  Status:  Discontinued     Comments: Send with pt to OR      1 g 100 mL/hr over 30 Minutes Intravenous On call 09/02/12 2140 09/02/12 2148   09/03/12 0600   ceFAZolin (ANCEF) IVPB 2 g/50 mL premix     Comments: Send with pt to OR      2 g 100 mL/hr over 30 Minutes Intravenous On call 09/02/12 2148 09/02/12 2212   09/03/12 0400   ciprofloxacin (CIPRO) IVPB 400 mg  Status:  Discontinued        400 mg 200 mL/hr over 60 Minutes Intravenous 2 times daily 09/03/12 0241 09/06/12 1607  09/03/12 0400   metroNIDAZOLE (FLAGYL) IVPB 500 mg  Status:  Discontinued        500 mg 100 mL/hr over 60 Minutes Intravenous 3 times per day 09/03/12 0241 09/06/12 1607   09/03/12 0045   ciprofloxacin (CIPRO) IVPB 400 mg  Status:  Discontinued        400 mg 200 mL/hr over 60 Minutes Intravenous Every 12 hours 09/03/12 0035 09/03/12 0235   09/03/12 0045   metroNIDAZOLE (FLAGYL) IVPB 500 mg  Status:  Discontinued        500 mg 100 mL/hr over 60 Minutes Intravenous Every 8 hours 09/03/12 0035 09/03/12 0235          Assessment/Plan: s/p Procedure(s) (LRB) with comments: EXPLORATORY LAPAROTOMY (N/A) - Exploratory Laparotomy with Sigmoid resection with end colocstomy. COLON RESECTION SIGMOID (N/A) - Sigmoid resection with End Colostomy. S/P hartmans for perf diverticulitis  POD#12 - bowel function returned Deconditioning - PT/OT AKI - got Lasix 1/8 AM per cardiology.  Will ask renal to see as CRT higher - I spoke to Dr. Hyman Hopes ETOH WD - CIWA Delirium at night - Haldol PRN,  VTE - heparin A fib - appreciate cardiology F/U. As above FL2 signed for eventual SNF Continue 3300 today  LOS: 12 days    Lea Walbert E 09/14/2012

## 2012-09-14 NOTE — Progress Notes (Signed)
Patient Name: Johnathan Bauer Date of Encounter: 09/14/2012  Principal Problem:  *Respiratory failure, post-operative Active Problems:  Atrial fibrillation  Arteriosclerotic cardiovascular disease (ASCVD)  Perforated sigmoid colon  Delirium tremens  NSVT, 7 beat run 1/02  Anasarca  Pulmonary edema  Thrombocytopenia  Fever  Acute renal failure    SUBJECTIVE: Pt oriented only to name. Very sedated post medication.  OBJECTIVE Filed Vitals:   09/14/12 0000 09/14/12 0142 09/14/12 0400 09/14/12 0713  BP: 133/97  130/87 161/78  Pulse: 71  85 97  Temp: 97.3 F (36.3 C)  97.4 F (36.3 C) 98.9 F (37.2 C)  TempSrc: Oral  Oral Oral  Resp: 31  23 20   Height:      Weight:      SpO2: 96% 92% 95% 95%    Intake/Output Summary (Last 24 hours) at 09/14/12 0813 Last data filed at 09/14/12 0600  Gross per 24 hour  Intake 2093.17 ml  Output   3900 ml  Net -1806.83 ml   Filed Weights   09/07/12 0200 09/09/12 0600 09/10/12 0700  Weight: 189 lb 13.1 oz (86.1 kg) 180 lb 8.9 oz (81.9 kg) 180 lb 1.9 oz (81.7 kg)   PHYSICAL EXAM General: Well developed, well nourished, elderly male in no acute distress. Head: Normocephalic, atraumatic.  Neck: Supple without bruits, JVD 6 cm. Lungs:  Resp regular and unlabored, rhonchi/coarse rales upper, rales lower. Heart: Irreg, S1, S2, no S3, S4, or murmur. Abdomen: Soft, non-tender, non-distended, BS decreased but present, colostomy bag in place  Extremities: No clubbing, cyanosis, edema.  Neuro: Alert and oriented X 1. Moves all extremities spontaneously. Psych: Confused, talks constantly (with or without someone in the room)  LABS: CBC: Basename 09/14/12 0450 09/13/12 0520  WBC 13.2* 9.7  NEUTROABS -- --  HGB 12.4* 12.5*  HCT 35.8* 35.2*  MCV 100.6* 98.3  PLT 477* 382   INR:No results found for this basename: INR in the last 72 hours Basic Metabolic Panel: Basename 09/14/12 0450 09/13/12 0520  NA 139 138  K 3.7 3.6  CL 102 102    CO2 27 24  GLUCOSE 115* 103*  BUN 17 13  CREATININE 2.56* 2.19*  CALCIUM 8.8 8.2*  MG -- --  PHOS -- --   BNP: Pro B Natriuretic peptide (BNP)  Date/Time Value Range Status  09/13/2012  5:20 AM 6602.0* 0 - 125 pg/mL Final  09/09/2012  4:46 AM 3020.0* 0 - 125 pg/mL Final    TELE: atrial fib, rate generally well-controlled        Radiology/Studies: Dg Chest 2 View 09/11/2012  *RADIOLOGY REPORT*  Clinical Data: Shortness of breath, question pneumonia.  CHEST - 2 VIEW  Comparison: 09/09/2012  Findings: Decreasing right perihilar and lower lobe airspace disease.  Mild residual right basilar infiltrate.  Mild residual density in the medial left lung base as well, slightly improved. No effusions.  Heart is normal size.  No acute bony abnormality.  IMPRESSION: Improving bilateral infiltrates.   Original Report Authenticated By: Charlett Nose, M.D.    Dg Chest Port 1 View 09/13/2012  *RADIOLOGY REPORT*  Clinical Data: Respiratory distress.  PORTABLE CHEST - 1 VIEW  Comparison: 09/11/2012.  Findings: Trachea is midline.  Heart size normal.  Mild bibasilar air space disease persists.  Lungs are otherwise clear.  No pleural fluid.  IMPRESSION: Persistent bibasilar air space disease.   Original Report Authenticated By: Leanna Battles, M.D.     Current Medications:     . aspirin EC  81  mg Oral Daily  . diltiazem  180 mg Oral Daily  . feeding supplement  237 mL Oral BID BM  . folic acid  1 mg Oral Daily  . heparin subcutaneous  5,000 Units Subcutaneous Q8H  . metoprolol tartrate  50 mg Oral Q12H  . nystatin cream   Topical BID  . pantoprazole  40 mg Oral Q1200  . prasugrel  10 mg Oral Daily  . thiamine  100 mg Oral Daily      ASSESSMENT AND PLAN: 1. Permanent atrial fibrillation. Rate well controlled. Digoxin discontinued due to ARF. Continue po metoprolol and diltiazem. Not a candidate for anticoagulation due to history of Etoh abuse and noncompliance.   2. CAD s/p DES OM in Sept. 2013.  Asymptomatic. On dual antiplatelet therapy with ASA and Effient.   3. 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   4. 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   5. Acute delirium secondary to DTs  - mgt per [facility]  6. Acute pulmonary edema. CXR yesterday really quite clear. BNP still elevated. Will hold diuretics with rising creatinine.  7. Acute renal failure. Creatinine increased 0.9>1.15>1.84>2.19>2.56. Urine output maintained. Etiology unclear. UA with trace HGB, rare bacteria, O/W OK.  He did receive one dose of IV lasix yesterday.  Recheck in am. Nephrology consulted.  Principal Problem:  *Respiratory failure, post-operative Active Problems:  Atrial fibrillation  Arteriosclerotic cardiovascular disease (ASCVD)  Perforated sigmoid colon  Delirium tremens  NSVT, 7 beat run 1/02  Anasarca  Pulmonary edema  Thrombocytopenia  Fever  Acute renal failure   Signed, Theodore Demark , PA-C 8:13 AM 09/14/2012 Patient seen and examined and history reviewed. Exam and recommendations reviewed and amended by me in above note.  Theron Arista [facility] 09/14/2012 11:28 AM

## 2012-09-14 NOTE — Progress Notes (Signed)
Pt becoming increasingly belligerent, during respiratory assessment. Pt punched in back and attempted to kick RN.  Pt put in Bilateral soft wrist and bilateral soft ankle. Soft waist belt remaining in place.

## 2012-09-14 NOTE — Progress Notes (Signed)
Clinical Child psychotherapist (CSW) contacted pt wife to give bed offers but wife requested for CSW to follow up tomorrow. Pt wife states she is in the room with staff who are discussing pt care. CSW to follow up tomorrow regarding bed offers.  Theresia Bough, MSW, Theresia Majors [phone]

## 2012-09-14 NOTE — Progress Notes (Signed)
60fr foley placed per MD order, immediate urine return noted. Pt tolerated procedure well. Will continue to monitor.

## 2012-09-14 NOTE — Consult Note (Signed)
Reason for Consult:AKI  Referring Physician: Dr. Gale Journey is an 62 y.o. male.  HPI: 62 yr old male with hx HTN, CAD s/p stents about 9 mon ago, smoking over 60pk yrs, presented 12/28 with severe abdm pain to [facility].  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 Chronic Afib.  Here developed ?DTs, ?asp pneu, and resp failure with transient vent dependence.  Has had to be sedated and treated for Afib.  Variable urine vol (?recording), and prob with voiding, had to have foley inserted but pulled 3 d ago.  Nonoliguric at this time.  Recurrent hypotension 1/2- now.  Also given Vanco and only 1 level done.  Had contrasted CT at [facility].    Does not take NSAIDs or not know to, no hx ACEI, no FH of renal dz, inherited eye, hearing, or ms skel defects.  No hx UTIs or stones.  Mother died in MVA at 18 and Father 57 of Lymphoma.  2 daughters, one with Crohns.  No illicit drugs.   All from wife  Constitutional: negative Eyes: wears glasses Ears, nose, mouth, throat, and face: negative Respiratory: smoking Cardiovascular: as above, currently no CP or limitation of activity Gastrointestinal: recent, at home no issues Genitourinary:as above Integument/breast: gets rashes with expose to dusts and grasses Hematologic/lymphatic: negative Musculoskeletal:positive for arthralgias Neurological: negative Endocrine: negative Allergic/Immunologic: as above   Past Medical History  Diagnosis Date  . A-fib     chronic afib - did not like coumadin  . Coronary artery disease     s/p PCI Sept 2013 with DES OM  . Diverticulitis with perforation     Past Surgical History  Procedure Date  . Cardiac catheterization   . Laparotomy 09/02/2012    Procedure: EXPLORATORY LAPAROTOMY;  Surgeon: Nada Libman, MD;  Location: [facility] OR;  Service: Vascular;  Laterality: N/A;  Exploratory Laparotomy with Sigmoid resection with end colocstomy.  .  Colostomy revision 09/02/2012    Procedure: COLON RESECTION SIGMOID;  Surgeon: Nada Libman, MD;  Location: [facility] OR;  Service: Vascular;  Laterality: N/A;  Sigmoid resection with End Colostomy.    History reviewed. No pertinent family history.  Social History:  reports that he has been smoking Cigarettes.  He has a 10 pack-year smoking history. He has never used smokeless tobacco. He reports that he drinks about 1.2 ounces of alcohol per week. He reports that he does not use illicit drugs.  Allergies: No Known Allergies  Medications:  I have reviewed the patient's current medications. Prior to Admission:  Prescriptions prior to admission  Medication Sig Dispense Refill  . aspirin EC 325 MG tablet Take 325 mg by mouth daily.      . digoxin (LANOXIN) 0.25 MG tablet Take 0.25 mg by mouth daily.      . metoprolol succinate (TOPROL-XL) 50 MG 24 hr tablet Take 50 mg by mouth 2 (two) times daily. Take with or immediately following a meal.      . nitroGLYCERIN (NITROSTAT) 0.4 MG SL tablet Place 0.4 mg under the tongue every 5 (five) minutes as needed. x3 doses For chest pain      . prasugrel (EFFIENT) 10 MG TABS Take 10 mg by mouth daily.         Results for orders placed during the hospital encounter of 09/02/12 (from the past 48 hour(s))  CBC     Status: Abnormal   Collection Time  09/13/12  5:20 AM      Component Value Range Comment   WBC 9.7  4.0 - 10.5 K/uL    RBC 3.58 (*) 4.22 - 5.81 MIL/uL    Hemoglobin 12.5 (*) 13.0 - 17.0 g/dL    HCT 65.7 (*) 84.6 - 52.0 %    MCV 98.3  78.0 - 100.0 fL    MCH 34.9 (*) 26.0 - 34.0 pg    MCHC 35.5  30.0 - 36.0 g/dL    RDW 96.2  95.2 - 84.1 %    Platelets 382  150 - 400 K/uL   BASIC METABOLIC PANEL     Status: Abnormal   Collection Time   09/13/12  5:20 AM      Component Value Range Comment   Sodium 138  135 - 145 mEq/L    Potassium 3.6  3.5 - 5.1 mEq/L    Chloride 102  96 - 112 mEq/L    CO2 24  19 - 32 mEq/L    Glucose, Bld 103 (*) 70 - 99  mg/dL    BUN 13  6 - 23 mg/dL    Creatinine, Ser 3.24 (*) 0.50 - 1.35 mg/dL    Calcium 8.2 (*) 8.4 - 10.5 mg/dL    GFR calc non Af Amer 31 (*) >90 mL/min    GFR calc Af Amer 36 (*) >90 mL/min   PRO B NATRIURETIC PEPTIDE     Status: Abnormal   Collection Time   09/13/12  5:20 AM      Component Value Range Comment   Pro B Natriuretic peptide (BNP) 6602.0 (*) 0 - 125 pg/mL   URINALYSIS, ROUTINE W REFLEX MICROSCOPIC     Status: Abnormal   Collection Time   09/13/12  8:52 AM      Component Value Range Comment   Color, Urine YELLOW  YELLOW    APPearance CLEAR  CLEAR    Specific Gravity, Urine 1.010  1.005 - 1.030    pH 7.0  5.0 - 8.0    Glucose, UA NEGATIVE  NEGATIVE mg/dL    Hgb urine dipstick TRACE (*) NEGATIVE    Bilirubin Urine NEGATIVE  NEGATIVE    Ketones, ur NEGATIVE  NEGATIVE mg/dL    Protein, ur NEGATIVE  NEGATIVE mg/dL    Urobilinogen, UA 0.2  0.0 - 1.0 mg/dL    Nitrite NEGATIVE  NEGATIVE    Leukocytes, UA NEGATIVE  NEGATIVE   URINE MICROSCOPIC-ADD ON     Status: Normal   Collection Time   09/13/12  8:52 AM      Component Value Range Comment   Squamous Epithelial / LPF RARE  RARE    WBC, UA 0-2  <3 WBC/hpf    RBC / HPF 0-2  <3 RBC/hpf    Bacteria, UA RARE  RARE   CBC     Status: Abnormal   Collection Time   09/14/12  4:50 AM      Component Value Range Comment   WBC 13.2 (*) 4.0 - 10.5 K/uL    RBC 3.56 (*) 4.22 - 5.81 MIL/uL    Hemoglobin 12.4 (*) 13.0 - 17.0 g/dL    HCT 40.1 (*) 02.7 - 52.0 %    MCV 100.6 (*) 78.0 - 100.0 fL    MCH 34.8 (*) 26.0 - 34.0 pg    MCHC 34.6  30.0 - 36.0 g/dL    RDW 25.3  66.4 - 40.3 %    Platelets 477 (*) 150 - 400 K/uL  BASIC METABOLIC PANEL     Status: Abnormal   Collection Time   09/14/12  4:50 AM      Component Value Range Comment   Sodium 139  135 - 145 mEq/L    Potassium 3.7  3.5 - 5.1 mEq/L    Chloride 102  96 - 112 mEq/L    CO2 27  19 - 32 mEq/L    Glucose, Bld 115 (*) 70 - 99 mg/dL    BUN 17  6 - 23 mg/dL    Creatinine, Ser  9.60 (*) 0.50 - 1.35 mg/dL    Calcium 8.8  8.4 - 45.4 mg/dL    GFR calc non Af Amer 25 (*) >90 mL/min    GFR calc Af Amer 29 (*) >90 mL/min     Dg Chest Port 1 View  09/13/2012  *RADIOLOGY REPORT*  Clinical Data: Respiratory distress.  PORTABLE CHEST - 1 VIEW  Comparison: 09/11/2012.  Findings: Trachea is midline.  Heart size normal.  Mild bibasilar air space disease persists.  Lungs are otherwise clear.  No pleural fluid.  IMPRESSION: Persistent bibasilar air space disease.   Original Report Authenticated By: Leanna Battles, M.D.     @ROS @ Blood pressure 161/78, pulse 97, temperature 99.2 F (37.3 C), temperature source Axillary, resp. rate 20, height 6' (1.829 m), weight 81.7 kg (180 lb 1.9 oz), SpO2 95.00%. @PHYSEXAMBYAGE2 @ Physical Examination: General appearance - sedated,,not cooperative Mental status - uncooperative, inappropriate safety awareness, as above Eyes - pupils equal and reactive, extraocular eye movements intact, funduscopic exam normal, discs flat and sharp Mouth - erythematous and dry oral mucosa Neck - adenopathy noted PCL Lymphatics - posterior cervical nodes Chest - rales noted bilat L>R, rhonchi noted bilat L>R, decreased expansion Heart - irregularly irregular rhythm with rate 80s, systolic murmur GR2/6 at 2nd left intercostal space Abdomen - tenderness noted diffuse bowel sounds normal Ostomy L mid abdm Musculoskeletal - no joint tenderness, deformity or swelling Extremities - pedal edema 2+ and presacral + Skin - stasis changes on legs below knees  Assessment/Plan: 1 AKI GFR 25-30 now.  Mild to mod vol xs, normal K, acid/base and not uremic.  Etiology most likely hemodynamic plus toxic (ie contrast, Vanco).  Bland urine against allergic interstitial and support ATN vs direct toxic change.   A very real possibility is obstruction with d/c of foley and prob that he had prior. 2 Divertic rupture per surgery 3 Hypertension: not an issue now 4. CAD  5. Afib 6  Pneu resolving P U/s, foley, Urine Na and Cr. Limit fluids.  Dose adjust meds. This should resolve if we can avoid complic of AKI  Meadow Abramo L 09/14/2012, 2:33 PM

## 2012-09-15 ENCOUNTER — Inpatient Hospital Stay (HOSPITAL_COMMUNITY): Payer: Managed Care, Other (non HMO)

## 2012-09-15 LAB — COMPREHENSIVE METABOLIC PANEL
Albumin: 2.1 g/dL — ABNORMAL LOW (ref 3.5–5.2)
Alkaline Phosphatase: 342 U/L — ABNORMAL HIGH (ref 39–117)
BUN: 17 mg/dL (ref 6–23)
Calcium: 8.3 mg/dL — ABNORMAL LOW (ref 8.4–10.5)
GFR calc Af Amer: 31 mL/min — ABNORMAL LOW (ref >90)
Glucose, Bld: 129 mg/dL — ABNORMAL HIGH (ref 70–99)
Potassium: 3.2 mEq/L — ABNORMAL LOW (ref 3.5–5.1)
Total Protein: 5.8 g/dL — ABNORMAL LOW (ref 6.0–8.3)

## 2012-09-15 LAB — CBC
HCT: 34.4 % — ABNORMAL LOW (ref 39.0–52.0)
MCH: 33.9 pg (ref 26.0–34.0)
MCHC: 33.4 g/dL (ref 30.0–36.0)
RDW: 14 % (ref 11.5–15.5)

## 2012-09-15 LAB — MAGNESIUM: Magnesium: 1.1 mg/dL — ABNORMAL LOW (ref 1.5–2.5)

## 2012-09-15 LAB — PHOSPHORUS: Phosphorus: 3.2 mg/dL (ref 2.3–4.6)

## 2012-09-15 MED ORDER — POTASSIUM CHLORIDE CRYS ER 20 MEQ PO TBCR
40.0000 meq | EXTENDED_RELEASE_TABLET | Freq: Once | ORAL | Status: AC
  Administered 2012-09-15: 40 meq via ORAL
  Filled 2012-09-15: qty 2

## 2012-09-15 MED ORDER — MAGNESIUM SULFATE 40 MG/ML IJ SOLN
2.0000 g | Freq: Once | INTRAMUSCULAR | Status: AC
  Administered 2012-09-15: 2 g via INTRAVENOUS
  Filled 2012-09-15: qty 50

## 2012-09-15 MED ORDER — POTASSIUM CHLORIDE CRYS ER 20 MEQ PO TBCR
20.0000 meq | EXTENDED_RELEASE_TABLET | Freq: Once | ORAL | Status: AC
  Administered 2012-09-15: 20 meq via ORAL
  Filled 2012-09-15: qty 1

## 2012-09-15 NOTE — Progress Notes (Signed)
13 Days Post-Op  Subjective: Up in chair, full from breakfast, getting up with PT  Objective: Vital signs in last 24 hours: Temp:  [97.9 F (36.6 C)-99.2 F (37.3 C)] 98.2 F (36.8 C) (01/10 0700) Pulse Rate:  [60-85] 77  (01/10 0353) Resp:  [20-26] 20  (01/10 0353) BP: (121-145)/(71-87) 138/87 mmHg (01/10 0800) SpO2:  [91 %-95 %] 95 % (01/10 0353) Last BM Date:  (colostomy)  Intake/Output from previous day: 01/09 0701 - 01/10 0700 In: 890 [P.O.:840; IV Piggyback:50] Out: 1200 [Urine:1100; Stool:100] Intake/Output this shift:    General appearance: alert Cardio: irregularly irregular rhythm GI: dressing on, ostomy with output, soft EXT: improved BLE edema  Lab Results:   Basename 09/15/12 0300 09/14/12 0450  WBC 12.4* 13.2*  HGB 11.5* 12.4*  HCT 34.4* 35.8*  PLT 490* 477*   BMET  Basename 09/15/12 0300 09/14/12 0450  NA 139 139  K 3.2* 3.7  CL 101 102  CO2 28 27  GLUCOSE 129* 115*  BUN 17 17  CREATININE 2.43* 2.56*  CALCIUM 8.3* 8.8   PT/INR No results found for this basename: LABPROT:2,INR:2 in the last 72 hours ABG No results found for this basename: PHART:2,PCO2:2,PO2:2,HCO3:2 in the last 72 hours  Studies/Results: US Renal  09/14/2012  *RADIOLOGY REPORT*  Clinical Data: Acute renal insufficiency, creatinine 2.56  RENAL/URINARY TRACT ULTRASOUND COMPLETE  Comparison:  None Correlation:  CT abdomen 08/25/2012  Findings:  Right Kidney:  10.8 cm length.  Cortical thinning at mid right kidney.  No mass, hydronephrosis shadowing calcification.  Renal parenchymal echogenicity normal.  Left Kidney:  10.5 cm length.  Normal cortical thickness echogenicity.  No mass, hydronephrosis or shadowing calcification.  Bladder:  Decompressed by Foley catheter, unable to evaluate.  Small bilateral pleural effusions and trace ascites noted.  IMPRESSION: Cortical scarring mid right kidney as noted on prior CT. No evidence of urinary tract obstruction or mass. Small bilateral  pleural effusions and trace ascites.   Original Report Authenticated By: Ulyses Southward, M.D.     Anti-infectives: Anti-infectives     Start     Dose/Rate Route Frequency Ordered Stop   09/13/12 0501   vancomycin (VANCOCIN) 1,250 mg in sodium chloride 0.9 % 250 mL IVPB  Status:  Discontinued        1,250 mg 166.7 mL/hr over 90 Minutes Intravenous Every 24 hours 09/12/12 1330 09/13/12 1351   09/06/12 1700   vancomycin (VANCOCIN) 1,250 mg in sodium chloride 0.9 % 250 mL IVPB  Status:  Discontinued        1,250 mg 166.7 mL/hr over 90 Minutes Intravenous Every 12 hours 09/06/12 1641 09/12/12 1330   09/06/12 1700   piperacillin-tazobactam (ZOSYN) IVPB 3.375 g  Status:  Discontinued        3.375 g 12.5 mL/hr over 240 Minutes Intravenous 3 times per day 09/06/12 1641 09/13/12 1351   09/04/12 2300   ertapenem (INVANZ) 1 g in sodium chloride 0.9 % 50 mL IVPB  Status:  Discontinued        1 g 100 mL/hr over 30 Minutes Intravenous  Once 09/03/12 0200 09/03/12 0240   09/03/12 0600   ceFAZolin (ANCEF) IVPB 1 g/50 mL premix  Status:  Discontinued     Comments: Send with pt to OR      1 g 100 mL/hr over 30 Minutes Intravenous On call 09/02/12 2140 09/02/12 2148   09/03/12 0600   ceFAZolin (ANCEF) IVPB 2 g/50 mL premix     Comments: Send with pt  to OR      2 g 100 mL/hr over 30 Minutes Intravenous On call 09/02/12 2148 09/02/12 2212   09/03/12 0400   ciprofloxacin (CIPRO) IVPB 400 mg  Status:  Discontinued        400 mg 200 mL/hr over 60 Minutes Intravenous 2 times daily 09/03/12 0241 09/06/12 1607   09/03/12 0400   metroNIDAZOLE (FLAGYL) IVPB 500 mg  Status:  Discontinued        500 mg 100 mL/hr over 60 Minutes Intravenous 3 times per day 09/03/12 0241 09/06/12 1607   09/03/12 0045   ciprofloxacin (CIPRO) IVPB 400 mg  Status:  Discontinued        400 mg 200 mL/hr over 60 Minutes Intravenous Every 12 hours 09/03/12 0035 09/03/12 0235   09/03/12 0045   metroNIDAZOLE (FLAGYL) IVPB 500 mg   Status:  Discontinued        500 mg 100 mL/hr over 60 Minutes Intravenous Every 8 hours 09/03/12 0035 09/03/12 0235          Assessment/Plan: s/p Procedure(s) (LRB) with comments: EXPLORATORY LAPAROTOMY (N/A) - Exploratory Laparotomy with Sigmoid resection with end colocstomy. COLON RESECTION SIGMOID (N/A) - Sigmoid resection with End Colostomy. S/P hartmans for perf diverticulitis POD#12 - bowel function returned Deconditioning - PT/OT AKI - appreciate renal input ETOH WD - CIWA Delirium at night - Haldol PRN Hypokalemia - replace gently VTE - heparin A fib - appreciate cardiology F/U. As above FL2 signed for eventual SNF Continue 3300 today  LOS: 13 days    Amelda Hapke E 09/15/2012

## 2012-09-15 NOTE — Progress Notes (Signed)
Clinical Child psychotherapist (CSW) visited pt room to meet with pt and wife regarding bed offers. Pt wife stated she now prefers for pt to return home as pt has become much more clear in his mental status and appears to be able to function better. CSW informed pt wife of [facility] services however that they would not provide 24hr care or provide therapy daily. Wife stated she understood and believed she or pt daughter could spend a week at home with pt upon discharge. CSW informed pt and spouse of ability to go to SNF for a week-2 to get rehab before returning home if there is question of pt being able to care for self. Both pt and spouse agreed to this as an option. During visit pt stated he has agreed to stop drinking and does not believe he will ever drink again. CSW informed pt of risks of relapse without any outside support such as substance abuse treatment/counseling. CSW informed pt of recommendations for some sort of rehab to stay sober. Pt stated he would be agreeable to counseling at discharge. CSW will provide pt wife with resources for substance abuse. Pt and wife have agreed to either [facility] or [facility] for SNF if pt is unable to return home.  CSW to remain following and will facilitate with dc planning.  Theresia Bough, MSW, Theresia Majors [phone]

## 2012-09-15 NOTE — Progress Notes (Signed)
[facility] ROUNDING NOTE   Subjective:   Interval History: sitting out of bed in chair  Objective:  Vital signs in last 24 hours:  Temp:  [97.9 F (36.6 C)-99.2 F (37.3 C)] 98.2 F (36.8 C) (01/10 0700) Pulse Rate:  [60-85] 77  (01/10 0353) Resp:  [20-26] 20  (01/10 0353) BP: (121-145)/(71-87) 138/87 mmHg (01/10 0800) SpO2:  [93 %-95 %] 95 % (01/10 0800)  Weight change:  Filed Weights   09/07/12 0200 09/09/12 0600 09/10/12 0700  Weight: 86.1 kg (189 lb 13.1 oz) 81.9 kg (180 lb 8.9 oz) 81.7 kg (180 lb 1.9 oz)    Intake/Output: I/O last 3 completed shifts: In: 1370 [P.O.:1320; IV Piggyback:50] Out: 2625 [Urine:2525; Stool:100]   Intake/Output this shift:     CVS- RRR RS- CTA ABD- hypoactive  EXT- 2 + edema   Basic Metabolic Panel:  Lab 09/15/12 1191 09/14/12 0450 09/13/12 0520 09/12/12 0520 09/11/12 0437  NA 139 139 138 138 132*  K 3.2* 3.7 3.6 3.1* 3.5  CL 101 102 102 102 96  CO2 28 27 24 28 29   GLUCOSE 129* 115* 103* 100* 99  BUN 17 17 13 11 8   CREATININE 2.43* 2.56* 2.19* 1.84* 1.15  CALCIUM 8.3* 8.8 8.2* -- --  MG 1.1* -- -- -- --  PHOS 3.2 -- -- -- --    Liver Function Tests:  Lab 09/15/12 0300  AST 27  ALT 15  ALKPHOS 342*  BILITOT 1.0  PROT 5.8*  ALBUMIN 2.1*   No results found for this basename: LIPASE:5,AMYLASE:5 in the last 168 hours No results found for this basename: AMMONIA:3 in the last 168 hours  CBC:  Lab 09/15/12 0300 09/14/12 0450 09/13/12 0520 09/12/12 0520 09/11/12 0437  WBC 12.4* 13.2* 9.7 13.9* 15.5*  NEUTROABS -- -- -- -- --  HGB 11.5* 12.4* 12.5* 12.3* 12.9*  HCT 34.4* 35.8* 35.2* 34.9* 36.7*  MCV 101.5* 100.6* 98.3 99.1 99.2  PLT 490* 477* 382 303 251    Cardiac Enzymes:  Lab 09/09/12 0446  CKTOTAL --  CKMB --  CKMBINDEX --  TROPONINI <0.30    BNP: No components found with this basename: POCBNP:5  CBG:  Lab 09/10/12 0812 09/10/12 09/09/12 1959 09/09/12 1543 09/09/12 1145  GLUCAP 62* 84  100* 169* 102*    Microbiology: Results for orders placed during the hospital encounter of 09/02/12  MRSA PCR SCREENING     Status: Normal   Collection Time   09/03/12  3:00 AM      Component Value Range Status Comment   MRSA by PCR NEGATIVE  NEGATIVE Final   CULTURE, BLOOD (ROUTINE X 2)     Status: Normal   Collection Time   09/06/12  5:45 PM      Component Value Range Status Comment   Specimen Description BLOOD RIGHT HAND   Final    Special Requests BOTTLES DRAWN AEROBIC ONLY 10CC   Final    Culture  Setup Time 09/07/2012 01:19   Final    Culture NO GROWTH 5 DAYS   Final    Report Status 09/13/2012 FINAL   Final   CULTURE, BLOOD (ROUTINE X 2)     Status: Normal   Collection Time   09/06/12  5:46 PM      Component Value Range Status Comment   Specimen Description BLOOD RIGHT HAND   Final    Special Requests BOTTLES DRAWN AEROBIC ONLY 10CC   Final    Culture  Setup Time  09/07/2012 01:19   Final    Culture NO GROWTH 5 DAYS   Final    Report Status 09/13/2012 FINAL   Final   CULTURE, RESPIRATORY     Status: Normal   Collection Time   09/07/12  5:21 PM      Component Value Range Status Comment   Specimen Description ENDOTRACHEAL   Final    Special Requests NONE   Final    Gram Stain     Final    Value: ABUNDANT WBC PRESENT,BOTH PMN AND MONONUCLEAR     NO SQUAMOUS EPITHELIAL CELLS SEEN     NO ORGANISMS SEEN   Culture FEW CANDIDA ALBICANS   Final    Report Status 09/10/2012 FINAL   Final     Coagulation Studies: No results found for this basename: LABPROT:5,INR:5 in the last 72 hours  Urinalysis:  Basename 09/13/12 0852  COLORURINE YELLOW  LABSPEC 1.010  PHURINE 7.0  GLUCOSEU NEGATIVE  HGBUR TRACE*  BILIRUBINUR NEGATIVE  KETONESUR NEGATIVE  PROTEINUR NEGATIVE  UROBILINOGEN 0.2  NITRITE NEGATIVE  LEUKOCYTESUR NEGATIVE      Imaging: US Renal  09/14/2012  *RADIOLOGY REPORT*  Clinical Data: Acute renal insufficiency, creatinine 2.56  RENAL/URINARY TRACT ULTRASOUND  COMPLETE  Comparison:  None Correlation:  CT abdomen 08/25/2012  Findings:  Right Kidney:  10.8 cm length.  Cortical thinning at mid right kidney.  No mass, hydronephrosis shadowing calcification.  Renal parenchymal echogenicity normal.  Left Kidney:  10.5 cm length.  Normal cortical thickness echogenicity.  No mass, hydronephrosis or shadowing calcification.  Bladder:  Decompressed by Foley catheter, unable to evaluate.  Small bilateral pleural effusions and trace ascites noted.  IMPRESSION: Cortical scarring mid right kidney as noted on prior CT. No evidence of urinary tract obstruction or mass. Small bilateral pleural effusions and trace ascites.   Original Report Authenticated By: Ulyses Southward, M.D.      Medications:        . aspirin EC  81 mg Oral Daily  . diltiazem  180 mg Oral Daily  . feeding supplement  237 mL Oral BID BM  . folic acid  1 mg Oral Daily  . heparin subcutaneous  5,000 Units Subcutaneous Q8H  . metoprolol tartrate  50 mg Oral Q12H  . nystatin cream   Topical BID  . pantoprazole  40 mg Oral Q1200  . prasugrel  10 mg Oral Daily  . thiamine  100 mg Oral Daily   acetaminophen (TYLENOL) oral liquid 160 mg/5 mL, acetaminophen, albuterol, haloperidol lactate, HYDROcodone-acetaminophen, LORazepam, ondansetron  Assessment/ Plan:   Acute renal injury. Appears to be improving agree that obstruction must be evaluated at some stage  Volume controlled for now  Anemia stable   LOS: 13 Devaris Quirk W @TODAY @1 :13 PM

## 2012-09-15 NOTE — Progress Notes (Signed)
Hypomagnesemia and Hypokalemia   Mg and K replaced

## 2012-09-15 NOTE — Progress Notes (Signed)
RN called to room- pt states feels like he is drowning. Listen to breath sounds and noted increase in crackles and rhonchi. Tresa Endo, PA notified. CXR ordered, will continue to monitor. 02 Sats at 97 on 2 L [place]. Encouraging deep breathing and coughing.

## 2012-09-15 NOTE — Progress Notes (Signed)
Patient Name: Johnathan Bauer Date of Encounter: 09/15/2012  Principal Problem:  *Respiratory failure, post-operative Active Problems:  Atrial fibrillation  Arteriosclerotic cardiovascular disease (ASCVD)  Perforated sigmoid colon  Delirium tremens  NSVT, 7 beat run 1/02  Anasarca  Pulmonary edema  Thrombocytopenia  Fever  Acute renal failure    SUBJECTIVE: Pt oriented only to name. Wants to know when he can go home. No complaints.   OBJECTIVE Filed Vitals:   09/14/12 2000 09/15/12 0000 09/15/12 0353 09/15/12 0700  BP: 140/81 137/83 145/84   Pulse: 85 78 77   Temp: 98.8 F (37.1 C) 99 F (37.2 C) 97.9 F (36.6 C) 98.2 F (36.8 C)  TempSrc: Oral Oral Oral Oral  Resp: 25 26 20    Height:      Weight:      SpO2: 93% 95% 95%     Intake/Output Summary (Last 24 hours) at 09/15/12 0854 Last data filed at 09/15/12 0700  Gross per 24 hour  Intake    890 ml  Output   1200 ml  Net   -310 ml   Filed Weights   09/07/12 0200 09/09/12 0600 09/10/12 0700  Weight: 189 lb 13.1 oz (86.1 kg) 180 lb 8.9 oz (81.9 kg) 180 lb 1.9 oz (81.7 kg)   PHYSICAL EXAM General: Well developed, well nourished, elderly male in no acute distress. Head: Normocephalic, atraumatic.  Neck: Supple without bruits, JVD 6 cm. Lungs:  Resp regular and unlabored, rhonchi/coarse rales upper, rales lower. Heart: Irreg, S1, S2, no S3, S4, or murmur. Abdomen: Soft, non-tender, non-distended, BS decreased but present, colostomy bag in place  Extremities: No clubbing, cyanosis, edema.  Neuro: Alert and oriented X 1. Moves all extremities spontaneously. Psych: Confused, talks constantly (with or without someone in the room)  LABS: CBC:  Basename 09/15/12 0300 09/14/12 0450  WBC 12.4* 13.2*  NEUTROABS -- --  HGB 11.5* 12.4*  HCT 34.4* 35.8*  MCV 101.5* 100.6*  PLT 490* 477*   Basic Metabolic Panel:  Basename 09/15/12 0300 09/14/12 0450  NA 139 139  K 3.2* 3.7  CL 101 102  CO2 28 27  GLUCOSE  129* 115*  BUN 17 17  CREATININE 2.43* 2.56*  CALCIUM 8.3* 8.8  MG 1.1* --  PHOS 3.2 --   BNP: Pro B Natriuretic peptide (BNP)  Date/Time Value Range Status  09/13/2012  5:20 AM 6602.0* 0 - 125 pg/mL Final  09/09/2012  4:46 AM 3020.0* 0 - 125 pg/mL Final    TELE: atrial fib, rate  well-controlled        Radiology/Studies: Dg Chest 2 View 09/11/2012  *RADIOLOGY REPORT*  Clinical Data: Shortness of breath, question pneumonia.  CHEST - 2 VIEW  Comparison: 09/09/2012  Findings: Decreasing right perihilar and lower lobe airspace disease.  Mild residual right basilar infiltrate.  Mild residual density in the medial left lung base as well, slightly improved. No effusions.  Heart is normal size.  No acute bony abnormality.  IMPRESSION: Improving bilateral infiltrates.   Original Report Authenticated By: Charlett Nose, M.D.    Dg Chest Port 1 View 09/13/2012  *RADIOLOGY REPORT*  Clinical Data: Respiratory distress.  PORTABLE CHEST - 1 VIEW  Comparison: 09/11/2012.  Findings: Trachea is midline.  Heart size normal.  Mild bibasilar air space disease persists.  Lungs are otherwise clear.  No pleural fluid.  IMPRESSION: Persistent bibasilar air space disease.   Original Report Authenticated By: Leanna Battles, M.D.     Current Medications:     .  aspirin EC  81 mg Oral Daily  . diltiazem  180 mg Oral Daily  . feeding supplement  237 mL Oral BID BM  . folic acid  1 mg Oral Daily  . heparin subcutaneous  5,000 Units Subcutaneous Q8H  . metoprolol tartrate  50 mg Oral Q12H  . nystatin cream   Topical BID  . pantoprazole  40 mg Oral Q1200  . prasugrel  10 mg Oral Daily  . thiamine  100 mg Oral Daily      ASSESSMENT AND PLAN: 1. Permanent atrial fibrillation. Rate well controlled. Digoxin discontinued due to ARF. Continue po metoprolol and diltiazem. Not a candidate for anticoagulation due to history of Etoh abuse and noncompliance.   2. CAD s/p DES OM in Sept. 2013. Asymptomatic. On dual antiplatelet  therapy with ASA and Effient.   3. 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   4. 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   5. Acute delirium secondary to DTs  - mgt per [facility]  6. Acute pulmonary edema. CXR 09/13/12 really quite clear. BNP still elevated. Will hold diuretics with rising creatinine.  7. Acute renal failure. Creatinine increased 0.9>1.15>1.84>2.19>2.56. Now decreased to 2.43. Urine output maintained. Renal US without obstruction.  Principal Problem:  *Respiratory failure, post-operative Active Problems:  Atrial fibrillation  Arteriosclerotic cardiovascular disease (ASCVD)  Perforated sigmoid colon  Delirium tremens  NSVT, 7 beat run 1/02  Anasarca  Pulmonary edema  Thrombocytopenia  Fever  Acute renal failure   [place] [facility] 09/15/2012 8:54 AM

## 2012-09-15 NOTE — Progress Notes (Addendum)
Physical Therapy Treatment Patient Details Name: Johnathan Bauer MRN: [id number] DOB: 04-23-1951 Today's Date: 09/15/2012 Time: 8657-8469 PT Time Calculation (min): 18 min  PT Assessment / Plan / Recommendation Comments on Treatment Session  Pt able to improve overall mobility this session however continues to need (A) for safety due to impaired cognition and overall safety awareness.  Pt continues to be impuslive at times with cues for proper technique to reduce fall risk.    Follow Up Recommendations  SNF     Equipment Recommendations  Rolling walker with 5" wheels    Frequency Min 3X/week   Plan Discharge plan needs to be updated;Frequency remains appropriate    Precautions / Restrictions Precautions Precautions: Fall Restrictions Weight Bearing Restrictions: No   Pertinent Vitals/Pain C/o SOB but no c/o pain;  Increase RR to upper 30s with ambulation SAO2 maintain > 90% on RA.  Needs constant cues for proper deep breathing. Pt continues to mouth breath    Mobility  Bed Mobility Bed Mobility: Sit to Supine Sit to Supine: 4: Min assist Details for Bed Mobility Assistance: (A) to elevate LE OOB with cues for technique. Transfers Transfers: Sit to Stand;Stand to Sit Sit to Stand: 4: Min assist;From chair/3-in-1 Stand to Sit: 4: Min assist;To bed Details for Transfer Assistance: (A) to initiate transfer with cues for hand placement.  Pt tends to attempt to sit without being in proper position with RW too far in front of pt.   Ambulation/Gait Ambulation/Gait Assistance: 4: Min assist Ambulation Distance (Feet): 150 Feet Assistive device: Rolling walker Ambulation/Gait Assistance Details: (A) to maintain balance with LOB with tursn.  Encourage increase step length and gait speed.  Pt continues to stay in forward flexed posture with tactile and VCs to stand upright and proper position within RW. Gait Pattern: Step-through pattern;Decreased stride length;Shuffle;Trunk flexed Gait  velocity: slow gait speed Stairs: No Wheelchair Mobility Wheelchair Mobility: No    Exercises     PT Diagnosis:    PT Problem List:   PT Treatment Interventions:     PT Goals Acute Rehab PT Goals PT Goal Formulation: With patient Time For Goal Achievement: 09/24/12 Potential to Achieve Goals: Good Pt will go Sit to Stand: with supervision PT Goal: Sit to Stand - Progress: Progressing toward goal Pt will go Stand to Sit: with supervision PT Goal: Stand to Sit - Progress: Progressing toward goal Pt will Transfer Bed to Chair/Chair to Bed: with supervision PT Transfer Goal: Bed to Chair/Chair to Bed - Progress: Progressing toward goal Pt will Ambulate: >150 feet;with supervision;with least restrictive assistive device;Other (comment) PT Goal: Ambulate - Progress: Progressing toward goal  Visit Information  Last PT Received On: 09/15/12 Assistance Needed: +2    Subjective Data  Subjective: "Why am I so out of breath?" Patient Stated Goal: did not set   Cognition  Overall Cognitive Status: Impaired Area of Impairment: Safety/judgement;Following commands;Problem solving;Awareness of deficits;Memory Arousal/Alertness: Awake/alert Orientation Level: Disoriented to;Place;Time Behavior During Session: Johnathan Bauer for tasks performed Following Commands: Follows multi-step commands with increased time Safety/Judgement: Decreased awareness of safety precautions;Decreased safety judgement for tasks assessed;Impulsive Safety/Judgement - Other Comments: Imparied overall safety and judgement    Balance  Balance Balance Assessed: Yes Static Standing Balance Static Standing - Balance Support: Bilateral upper extremity supported Static Standing - Level of Assistance: 4: Min assist Static Standing - Comment/# of Minutes: (A) to maintain balance due to posterior lean.  End of Session PT - End of Session Equipment Utilized During Treatment: Gait belt Activity Tolerance:  Patient tolerated  treatment well Patient left: in bed;with call bell/phone within reach Nurse Communication: Mobility status   GP     Harry Bark 09/15/2012, 10:08 AM Jake Shark, PT DPT [phone]

## 2012-09-16 LAB — BASIC METABOLIC PANEL
BUN: 18 mg/dL (ref 6–23)
Chloride: 102 mEq/L (ref 96–112)
GFR calc Af Amer: 37 mL/min — ABNORMAL LOW (ref >90)
GFR calc non Af Amer: 32 mL/min — ABNORMAL LOW (ref >90)
Potassium: 3.9 mEq/L (ref 3.5–5.1)
Sodium: 137 mEq/L (ref 135–145)

## 2012-09-16 LAB — CBC
HCT: 37.2 % — ABNORMAL LOW (ref 39.0–52.0)
Hemoglobin: 12.4 g/dL — ABNORMAL LOW (ref 13.0–17.0)
MCHC: 33.3 g/dL (ref 30.0–36.0)
RBC: 3.67 MIL/uL — ABNORMAL LOW (ref 4.22–5.81)
WBC: 11.8 10*3/uL — ABNORMAL HIGH (ref 4.0–10.5)

## 2012-09-16 MED ORDER — HYDRALAZINE HCL 20 MG/ML IJ SOLN
5.0000 mg | INTRAMUSCULAR | Status: DC | PRN
  Filled 2012-09-16: qty 0.25

## 2012-09-16 NOTE — Progress Notes (Signed)
Pt BP 150/105 took manual BP 162/100. Paged MD Lindie Spruce. MD Lindie Spruce stated to continue to monitor and if BP stays high next VS check then meds could be given, no new orders were given. Will continue to monitor.  [place], [place], Charity fundraiser

## 2012-09-16 NOTE — Progress Notes (Signed)
General surgery attending note:  The patient interviewed and examined this morning. Agree with the assessment and treatment plan outlined by Clance Boll., PA.  He seems to be tolerating his diet and his colostomy is functioning normal. Abdominal exam is fairly benign. One small open area being packed up above. Staples in place, will need to come out next week.  Transport to [address] if close to nursing station and can be monitored closely.  Angelia Mould. Derrell Lolling, M.D., [facility] General and Minimally invasive Surgery Breast and Colorectal Surgery Office:   [phone] Pager:   [phone]

## 2012-09-16 NOTE — Progress Notes (Signed)
[facility] ROUNDING NOTE   Subjective:   Interval History:no complaints  Objective:  Vital signs in last 24 hours:  Temp:  [97.6 F (36.4 C)-98.5 F (36.9 C)] 98.5 F (36.9 C) (01/11 0800) Pulse Rate:  [73-84] 84  (01/11 1000) Resp:  [24-31] 24  (01/11 0328) BP: (138-164)/(84-100) 164/93 mmHg (01/11 1000) SpO2:  [81 %-98 %] 95 % (01/11 0328)  Weight change:  Filed Weights   09/07/12 0200 09/09/12 0600 09/10/12 0700  Weight: 86.1 kg (189 lb 13.1 oz) 81.9 kg (180 lb 8.9 oz) 81.7 kg (180 lb 1.9 oz)    Intake/Output: I/O last 3 completed shifts: In: 750 [P.O.:700; IV Piggyback:50] Out: 2025 [Urine:1500; Stool:525]   Intake/Output this shift:  Total I/O In: 240 [P.O.:240] Out: 10 [Stool:10]  CVS- RRR RS- CTA ABD- BS present soft non-distended EXT- no edema   Basic Metabolic Panel:  Lab 09/16/12 4540 09/15/12 0300 09/14/12 0450 09/13/12 0520 09/12/12 0520  NA 137 139 139 138 138  K 3.9 3.2* 3.7 3.6 3.1*  CL 102 101 102 102 102  CO2 26 28 27 24 28   GLUCOSE 116* 129* 115* 103* 100*  BUN 18 17 17 13 11   CREATININE 2.11* 2.43* 2.56* 2.19* 1.84*  CALCIUM 8.4 8.3* 8.8 -- --  MG -- 1.1* -- -- --  PHOS -- 3.2 -- -- --    Liver Function Tests:  Lab 09/15/12 0300  AST 27  ALT 15  ALKPHOS 342*  BILITOT 1.0  PROT 5.8*  ALBUMIN 2.1*   No results found for this basename: LIPASE:5,AMYLASE:5 in the last 168 hours No results found for this basename: AMMONIA:3 in the last 168 hours  CBC:  Lab 09/16/12 0500 09/15/12 0300 09/14/12 0450 09/13/12 0520 09/12/12 0520  WBC 11.8* 12.4* 13.2* 9.7 13.9*  NEUTROABS -- -- -- -- --  HGB 12.4* 11.5* 12.4* 12.5* 12.3*  HCT 37.2* 34.4* 35.8* 35.2* 34.9*  MCV 101.4* 101.5* 100.6* 98.3 99.1  PLT 543* 490* 477* 382 303    Cardiac Enzymes: No results found for this basename: CKTOTAL:5,CKMB:5,CKMBINDEX:5,TROPONINI:5 in the last 168 hours  BNP: No components found with this basename: POCBNP:5  CBG:  Lab  09/10/12 0812 09/10/12 09/09/12 1959 09/09/12 1543  GLUCAP 62* 84 100* 169*    Microbiology: Results for orders placed during the hospital encounter of 09/02/12  MRSA PCR SCREENING     Status: Normal   Collection Time   09/03/12  3:00 AM      Component Value Range Status Comment   MRSA by PCR NEGATIVE  NEGATIVE Final   CULTURE, BLOOD (ROUTINE X 2)     Status: Normal   Collection Time   09/06/12  5:45 PM      Component Value Range Status Comment   Specimen Description BLOOD RIGHT HAND   Final    Special Requests BOTTLES DRAWN AEROBIC ONLY 10CC   Final    Culture  Setup Time 09/07/2012 01:19   Final    Culture NO GROWTH 5 DAYS   Final    Report Status 09/13/2012 FINAL   Final   CULTURE, BLOOD (ROUTINE X 2)     Status: Normal   Collection Time   09/06/12  5:46 PM      Component Value Range Status Comment   Specimen Description BLOOD RIGHT HAND   Final    Special Requests BOTTLES DRAWN AEROBIC ONLY 10CC   Final    Culture  Setup Time 09/07/2012 01:19   Final  Culture NO GROWTH 5 DAYS   Final    Report Status 09/13/2012 FINAL   Final   CULTURE, RESPIRATORY     Status: Normal   Collection Time   09/07/12  5:21 PM      Component Value Range Status Comment   Specimen Description ENDOTRACHEAL   Final    Special Requests NONE   Final    Gram Stain     Final    Value: ABUNDANT WBC PRESENT,BOTH PMN AND MONONUCLEAR     NO SQUAMOUS EPITHELIAL CELLS SEEN     NO ORGANISMS SEEN   Culture FEW CANDIDA ALBICANS   Final    Report Status 09/10/2012 FINAL   Final     Coagulation Studies: No results found for this basename: LABPROT:5,INR:5 in the last 72 hours  Urinalysis: No results found for this basename: COLORURINE:2,APPERANCEUR:2,LABSPEC:2,PHURINE:2,GLUCOSEU:2,HGBUR:2,BILIRUBINUR:2,KETONESUR:2,PROTEINUR:2,UROBILINOGEN:2,NITRITE:2,LEUKOCYTESUR:2 in the last 72 hours    Imaging: US Renal  09/14/2012  *RADIOLOGY REPORT*  Clinical Data: Acute renal insufficiency, creatinine 2.56   RENAL/URINARY TRACT ULTRASOUND COMPLETE  Comparison:  None Correlation:  CT abdomen 08/25/2012  Findings:  Right Kidney:  10.8 cm length.  Cortical thinning at mid right kidney.  No mass, hydronephrosis shadowing calcification.  Renal parenchymal echogenicity normal.  Left Kidney:  10.5 cm length.  Normal cortical thickness echogenicity.  No mass, hydronephrosis or shadowing calcification.  Bladder:  Decompressed by Foley catheter, unable to evaluate.  Small bilateral pleural effusions and trace ascites noted.  IMPRESSION: Cortical scarring mid right kidney as noted on prior CT. No evidence of urinary tract obstruction or mass. Small bilateral pleural effusions and trace ascites.   Original Report Authenticated By: Ulyses Southward, M.D.    Dg Chest Port 1 View  09/15/2012  *RADIOLOGY REPORT*  Clinical Data: Coarse breath sounds.  Chest congestion.  PORTABLE CHEST - 1 VIEW  Comparison: 09/13/2012.  Findings: The heart and mediastinal structures remain normal. Stable bibasilar infiltrates.  No new pulmonary parenchymal changes.  No effusions or pneumothoraces.  No acute bony changes.  IMPRESSION: No interval changes.  Bibasilar air space disease.   Original Report Authenticated By: Sander Radon, M.D.      Medications:        . aspirin EC  81 mg Oral Daily  . diltiazem  180 mg Oral Daily  . feeding supplement  237 mL Oral BID BM  . folic acid  1 mg Oral Daily  . heparin subcutaneous  5,000 Units Subcutaneous Q8H  . metoprolol tartrate  50 mg Oral Q12H  . nystatin cream   Topical BID  . pantoprazole  40 mg Oral Q1200  . prasugrel  10 mg Oral Daily  . thiamine  100 mg Oral Daily   acetaminophen (TYLENOL) oral liquid 160 mg/5 mL, acetaminophen, albuterol, haloperidol lactate, hydrALAZINE, HYDROcodone-acetaminophen, LORazepam, ondansetron  Assessment/ Plan:  Acute renal injury. Appears to be improving agree that obstruction must be evaluated at some stage  Volume controlled for now  Anemia  stable  Diuresing well creatinine improved  LOS: 14 Ladena Jacquez W @TODAY @12 :02 PM

## 2012-09-16 NOTE — Progress Notes (Signed)
Patient Name: Johnathan Bauer      SUBJECTIVE:   AFib permaneanet; he is a history of coronary artery disease  Echo demonstrated ejection fraction of 60-65% with moderate prolapse. This may be more of an issue than a transthoracic suggests given the eccentric nature suggested by isolated posterior leaflet involvement e  Past Medical History  Diagnosis Date  . A-fib     chronic afib - did not like coumadin  . Coronary artery disease     s/p PCI Sept 2013 with DES OM  . Diverticulitis with perforation     PHYSICAL EXAM Filed Vitals:   09/16/12 0328 09/16/12 0700 09/16/12 0800 09/16/12 1000  BP: 162/100  164/93 164/93  Pulse: 79   84  Temp: 97.7 F (36.5 C) 98.5 F (36.9 C) 98.5 F (36.9 C)   TempSrc: Oral Oral Oral   Resp: 24     Height:      Weight:      SpO2: 95%       General appearance: alert and no distress Lungs: rhonchi bilaterallyu Heart: irregularly irregular rhythm Abdomen: soft, non-tender; bowel sounds normal; no masses,  no organomegaly Extremities: edema 2+  TELEMETRY: Reviewed telemetry pt in afib controlled VR:    Intake/Output Summary (Last 24 hours) at 09/16/12 1133 Last data filed at 09/16/12 1100  Gross per 24 hour  Intake    460 ml  Output   1135 ml  Net   -675 ml    LABS: Basic Metabolic Panel:  Lab 09/16/12 1191 09/15/12 0300 09/14/12 0450 09/13/12 0520 09/12/12 0520 09/11/12 0437 09/10/12 0425  NA 137 139 139 138 138 132* 133*  K 3.9 3.2* 3.7 3.6 3.1* 3.5 3.0*  CL 102 101 102 102 102 96 95*  CO2 26 28 27 24 28 29 31   GLUCOSE 116* 129* 115* 103* 100* 99 96  BUN 18 17 17 13 11 8 6   CREATININE 2.11* 2.43* 2.56* 2.19* 1.84* 1.15 0.70  CALCIUM 8.4 8.3* -- -- -- -- --  MG -- 1.1* -- -- -- -- --  PHOS -- 3.2 -- -- -- -- --   Cardiac Enzymes: No results found for this basename: CKTOTAL:3,CKMB:3,CKMBINDEX:3,TROPONINI:3 in the last 72 hours CBC:  Lab 09/16/12 0500 09/15/12 0300 09/14/12 0450 09/13/12 0520 09/12/12 0520 09/11/12 0437  09/10/12 0425  WBC 11.8* 12.4* 13.2* 9.7 13.9* 15.5* 13.7*  NEUTROABS -- -- -- -- -- -- --  HGB 12.4* 11.5* 12.4* 12.5* 12.3* 12.9* 12.8*  HCT 37.2* 34.4* 35.8* 35.2* 34.9* 36.7* 36.4*  MCV 101.4* 101.5* 100.6* 98.3 99.1 99.2 98.4  PLT 543* 490* 477* 382 303 251 216   PROTIME: No results found for this basename: LABPROT:3,INR:3 in the last 72 hours Liver Function Tests:  Basename 09/15/12 0300  AST 27  ALT 15  ALKPHOS 342*  BILITOT 1.0  PROT 5.8*  ALBUMIN 2.1*   No results found for this basename: LIPASE:2,AMYLASE:2 in the last 72 hours BNP: BNP (last 3 results)  Basename 09/13/12 0520 09/09/12 0446 09/08/12 0345  PROBNP 6602.0* 3020.0* 4843.0*      ASSESSMENT AND PLAN:  Patient Active Hospital Problem List: Respiratory failure, post-operative (09/07/2012)  Post op Lap/resection for perforated diverticulitis   Atrial fibrillation (09/03/2012)    Arteriosclerotic cardiovascular disease (ASCVD) (09/03/2012)     * Pulmonary edema (09/07/2012)   Thrombocytopenia (09/07/2012)   Fever (09/07/2012)   Acute renal failure (09/13/2012)    Rate control is reasonable Continue current meds Diuretics per renal;  Signed, Viviann Spare  Graciela Husbands MD  09/16/2012

## 2012-09-16 NOTE — Progress Notes (Signed)
Patient ID: Johnathan Bauer, male   DOB: 1951-06-02, 62 y.o.   MRN: [id number] 14 Days Post-Op  Subjective: Pt wo complaints, tolerating diet well, denies nausea or vomiting, +bowel function, no acute events  Objective: Vital signs in last 24 hours: Temp:  [97.6 F (36.4 C)-98.5 F (36.9 C)] 98.5 F (36.9 C) (01/11 0800) Pulse Rate:  [73-82] 79  (01/11 0328) Resp:  [24-31] 24  (01/11 0328) BP: (138-164)/(84-100) 164/93 mmHg (01/11 0800) SpO2:  [81 %-98 %] 95 % (01/11 0328) Last BM Date:  (colostomy)  Intake/Output from previous day: 01/10 0701 - 01/11 0700 In: 220 [P.O.:220] Out: 1125 [Urine:700; Stool:425] Intake/Output this shift: Total I/O In: 120 [P.O.:120] Out: 10 [Stool:10]  General appearance: alert Cardio: irregularly irregular rhythm GI: dressing on, ostomy with output, soft EXT: improved BLE edema  Lab Results:   Basename 09/16/12 0500 09/15/12 0300  WBC 11.8* 12.4*  HGB 12.4* 11.5*  HCT 37.2* 34.4*  PLT 543* 490*   BMET  Basename 09/16/12 0500 09/15/12 0300  NA 137 139  K 3.9 3.2*  CL 102 101  CO2 26 28  GLUCOSE 116* 129*  BUN 18 17  CREATININE 2.11* 2.43*  CALCIUM 8.4 8.3*   PT/INR No results found for this basename: LABPROT:2,INR:2 in the last 72 hours ABG No results found for this basename: PHART:2,PCO2:2,PO2:2,HCO3:2 in the last 72 hours  Studies/Results: US Renal  09/14/2012  *RADIOLOGY REPORT*  Clinical Data: Acute renal insufficiency, creatinine 2.56  RENAL/URINARY TRACT ULTRASOUND COMPLETE  Comparison:  None Correlation:  CT abdomen 08/25/2012  Findings:  Right Kidney:  10.8 cm length.  Cortical thinning at mid right kidney.  No mass, hydronephrosis shadowing calcification.  Renal parenchymal echogenicity normal.  Left Kidney:  10.5 cm length.  Normal cortical thickness echogenicity.  No mass, hydronephrosis or shadowing calcification.  Bladder:  Decompressed by Foley catheter, unable to evaluate.  Small bilateral pleural effusions and trace  ascites noted.  IMPRESSION: Cortical scarring mid right kidney as noted on prior CT. No evidence of urinary tract obstruction or mass. Small bilateral pleural effusions and trace ascites.   Original Report Authenticated By: Ulyses Southward, M.D.    Dg Chest Port 1 View  09/15/2012  *RADIOLOGY REPORT*  Clinical Data: Coarse breath sounds.  Chest congestion.  PORTABLE CHEST - 1 VIEW  Comparison: 09/13/2012.  Findings: The heart and mediastinal structures remain normal. Stable bibasilar infiltrates.  No new pulmonary parenchymal changes.  No effusions or pneumothoraces.  No acute bony changes.  IMPRESSION: No interval changes.  Bibasilar air space disease.   Original Report Authenticated By: Sander Radon, M.D.     Anti-infectives: Anti-infectives     Start     Dose/Rate Route Frequency Ordered Stop   09/13/12 0501   vancomycin (VANCOCIN) 1,250 mg in sodium chloride 0.9 % 250 mL IVPB  Status:  Discontinued        1,250 mg 166.7 mL/hr over 90 Minutes Intravenous Every 24 hours 09/12/12 1330 09/13/12 1351   09/06/12 1700   vancomycin (VANCOCIN) 1,250 mg in sodium chloride 0.9 % 250 mL IVPB  Status:  Discontinued        1,250 mg 166.7 mL/hr over 90 Minutes Intravenous Every 12 hours 09/06/12 1641 09/12/12 1330   09/06/12 1700   piperacillin-tazobactam (ZOSYN) IVPB 3.375 g  Status:  Discontinued        3.375 g 12.5 mL/hr over 240 Minutes Intravenous 3 times per day 09/06/12 1641 09/13/12 1351   09/04/12 2300   ertapenem (INVANZ)  1 g in sodium chloride 0.9 % 50 mL IVPB  Status:  Discontinued        1 g 100 mL/hr over 30 Minutes Intravenous  Once 09/03/12 0200 09/03/12 0240   09/03/12 0600   ceFAZolin (ANCEF) IVPB 1 g/50 mL premix  Status:  Discontinued     Comments: Send with pt to OR      1 g 100 mL/hr over 30 Minutes Intravenous On call 09/02/12 2140 09/02/12 2148   09/03/12 0600   ceFAZolin (ANCEF) IVPB 2 g/50 mL premix     Comments: Send with pt to OR      2 g 100 mL/hr over 30 Minutes  Intravenous On call 09/02/12 2148 09/02/12 2212   09/03/12 0400   ciprofloxacin (CIPRO) IVPB 400 mg  Status:  Discontinued        400 mg 200 mL/hr over 60 Minutes Intravenous 2 times daily 09/03/12 0241 09/06/12 1607   09/03/12 0400   metroNIDAZOLE (FLAGYL) IVPB 500 mg  Status:  Discontinued        500 mg 100 mL/hr over 60 Minutes Intravenous 3 times per day 09/03/12 0241 09/06/12 1607   09/03/12 0045   ciprofloxacin (CIPRO) IVPB 400 mg  Status:  Discontinued        400 mg 200 mL/hr over 60 Minutes Intravenous Every 12 hours 09/03/12 0035 09/03/12 0235   09/03/12 0045   metroNIDAZOLE (FLAGYL) IVPB 500 mg  Status:  Discontinued        500 mg 100 mL/hr over 60 Minutes Intravenous Every 8 hours 09/03/12 0035 09/03/12 0235          Assessment/Plan: s/p Procedure(s) (LRB) with comments: EXPLORATORY LAPAROTOMY (N/A) - Exploratory Laparotomy with Sigmoid resection with end colocstomy. COLON RESECTION SIGMOID (N/A) - Sigmoid resection with End Colostomy. S/P hartmans for perf diverticulitis POD#13 - bowel function returned, regular diet Deconditioning - PT/OT AKI - appreciate renal input ETOH WD - CIWA Delirium at night - Haldol PRN Hypokalemia - replace gently VTE - heparin A fib - appreciate cardiology F/U. As above HTN - SBP presistently in the 160 range, already on metoprolol 50mg  twice daily (was on XL at home 50mg  daily), will add PRN apresoline for now and have cardiology follow up on this FL2 signed for eventual SNF  Transfer to 6n with bed close to nurses station   LOS: 14 days    Christianne Zacher, [facility] 09/16/2012

## 2012-09-16 NOTE — Progress Notes (Signed)
Patient being transferred to [facility] per MD order. Report called to [place], Charity fundraiser. Patient will transfer in wheel chair, all vital signs WNL. Patient wife at bedside.

## 2012-09-17 LAB — BASIC METABOLIC PANEL
BUN: 20 mg/dL (ref 6–23)
CO2: 20 mEq/L (ref 19–32)
Chloride: 98 mEq/L (ref 96–112)
GFR calc non Af Amer: 35 mL/min — ABNORMAL LOW (ref >90)
Glucose, Bld: 136 mg/dL — ABNORMAL HIGH (ref 70–99)
Potassium: 4.5 mEq/L (ref 3.5–5.1)
Sodium: 133 mEq/L — ABNORMAL LOW (ref 135–145)

## 2012-09-17 NOTE — Progress Notes (Signed)
[facility] ROUNDING NOTE   Subjective:   Interval History: no complaints  Objective:  Vital signs in last 24 hours:  Temp:  [98.3 F (36.8 C)-99.3 F (37.4 C)] 98.8 F (37.1 C) (01/12 0459) Pulse Rate:  [71-98] 90  (01/12 0459) Resp:  [18] 18  (01/12 0459) BP: (141-157)/(78-95) 156/89 mmHg (01/12 0459) SpO2:  [94 %-100 %] 98 % (01/12 0459)  Weight change:  Filed Weights   09/07/12 0200 09/09/12 0600 09/10/12 0700  Weight: 86.1 kg (189 lb 13.1 oz) 81.9 kg (180 lb 8.9 oz) 81.7 kg (180 lb 1.9 oz)    Intake/Output: I/O last 3 completed shifts: In: 760 [P.O.:760] Out: 2085 [Urine:1550; Stool:535]   Intake/Output this shift:  Total I/O In: 240 [P.O.:240] Out: -   CVS- RRR RS- CTA ABD- BS present soft non-distended EXT- no edema   Basic Metabolic Panel:  Lab 09/17/12 2725 09/16/12 0500 09/15/12 0300 09/14/12 0450 09/13/12 0520  NA 133* 137 139 139 138  K 4.5 3.9 3.2* 3.7 3.6  CL 98 102 101 102 102  CO2 20 26 28 27 24   GLUCOSE 136* 116* 129* 115* 103*  BUN 20 18 17 17 13   CREATININE 1.98* 2.11* 2.43* 2.56* 2.19*  CALCIUM 8.6 8.4 8.3* -- --  MG -- -- 1.1* -- --  PHOS -- -- 3.2 -- --    Liver Function Tests:  Lab 09/15/12 0300  AST 27  ALT 15  ALKPHOS 342*  BILITOT 1.0  PROT 5.8*  ALBUMIN 2.1*   No results found for this basename: LIPASE:5,AMYLASE:5 in the last 168 hours No results found for this basename: AMMONIA:3 in the last 168 hours  CBC:  Lab 09/16/12 0500 09/15/12 0300 09/14/12 0450 09/13/12 0520 09/12/12 0520  WBC 11.8* 12.4* 13.2* 9.7 13.9*  NEUTROABS -- -- -- -- --  HGB 12.4* 11.5* 12.4* 12.5* 12.3*  HCT 37.2* 34.4* 35.8* 35.2* 34.9*  MCV 101.4* 101.5* 100.6* 98.3 99.1  PLT 543* 490* 477* 382 303    Cardiac Enzymes: No results found for this basename: CKTOTAL:5,CKMB:5,CKMBINDEX:5,TROPONINI:5 in the last 168 hours  BNP: No components found with this basename: POCBNP:5  CBG: No results found for this basename:  GLUCAP:5 in the last 168 hours  Microbiology: Results for orders placed during the hospital encounter of 09/02/12  MRSA PCR SCREENING     Status: Normal   Collection Time   09/03/12  3:00 AM      Component Value Range Status Comment   MRSA by PCR NEGATIVE  NEGATIVE Final   CULTURE, BLOOD (ROUTINE X 2)     Status: Normal   Collection Time   09/06/12  5:45 PM      Component Value Range Status Comment   Specimen Description BLOOD RIGHT HAND   Final    Special Requests BOTTLES DRAWN AEROBIC ONLY 10CC   Final    Culture  Setup Time 09/07/2012 01:19   Final    Culture NO GROWTH 5 DAYS   Final    Report Status 09/13/2012 FINAL   Final   CULTURE, BLOOD (ROUTINE X 2)     Status: Normal   Collection Time   09/06/12  5:46 PM      Component Value Range Status Comment   Specimen Description BLOOD RIGHT HAND   Final    Special Requests BOTTLES DRAWN AEROBIC ONLY 10CC   Final    Culture  Setup Time 09/07/2012 01:19   Final    Culture NO GROWTH 5 DAYS  Final    Report Status 09/13/2012 FINAL   Final   CULTURE, RESPIRATORY     Status: Normal   Collection Time   09/07/12  5:21 PM      Component Value Range Status Comment   Specimen Description ENDOTRACHEAL   Final    Special Requests NONE   Final    Gram Stain     Final    Value: ABUNDANT WBC PRESENT,BOTH PMN AND MONONUCLEAR     NO SQUAMOUS EPITHELIAL CELLS SEEN     NO ORGANISMS SEEN   Culture FEW CANDIDA ALBICANS   Final    Report Status 09/10/2012 FINAL   Final     Coagulation Studies: No results found for this basename: LABPROT:5,INR:5 in the last 72 hours  Urinalysis: No results found for this basename: COLORURINE:2,APPERANCEUR:2,LABSPEC:2,PHURINE:2,GLUCOSEU:2,HGBUR:2,BILIRUBINUR:2,KETONESUR:2,PROTEINUR:2,UROBILINOGEN:2,NITRITE:2,LEUKOCYTESUR:2 in the last 72 hours    Imaging: Dg Chest Port 1 View  09/15/2012  *RADIOLOGY REPORT*  Clinical Data: Coarse breath sounds.  Chest congestion.  PORTABLE CHEST - 1 VIEW  Comparison: 09/13/2012.   Findings: The heart and mediastinal structures remain normal. Stable bibasilar infiltrates.  No new pulmonary parenchymal changes.  No effusions or pneumothoraces.  No acute bony changes.  IMPRESSION: No interval changes.  Bibasilar air space disease.   Original Report Authenticated By: Sander Radon, M.D.      Medications:        . aspirin EC  81 mg Oral Daily  . diltiazem  180 mg Oral Daily  . feeding supplement  237 mL Oral BID BM  . folic acid  1 mg Oral Daily  . heparin subcutaneous  5,000 Units Subcutaneous Q8H  . metoprolol tartrate  50 mg Oral Q12H  . nystatin cream   Topical BID  . pantoprazole  40 mg Oral Q1200  . prasugrel  10 mg Oral Daily  . thiamine  100 mg Oral Daily   acetaminophen (TYLENOL) oral liquid 160 mg/5 mL, acetaminophen, albuterol, haloperidol lactate, hydrALAZINE, HYDROcodone-acetaminophen, LORazepam, ondansetron  Assessment/ Plan:  Acute renal injury. Appears to be improving agree that obstruction must be evaluated at some stage  Volume controlled for now  Anemia stable   Creatinine improved will sign off however need to ensure voiding prior to removal of catheter  LOS: 15 Vayda Dungee W @TODAY @11 :41 AM

## 2012-09-17 NOTE — Progress Notes (Signed)
General surgery attending note:  I've interviewed and examined this patient this morning. I agree with the assessment and treatment plan outlined by Clance Boll, PA  His GI function has normalized. His colostomy is working well. He needs all discharge criteria for discharge to rehabilitation or SNF.   FL-2 signed.  We will check with renal about whether we can proceed with taking the Foley out. There was some concern about obstruction earlier which we need to clarify. Creatinine T. Continues to slowly improve.   Angelia Mould. Derrell Lolling, M.D., [facility] General and Minimally invasive Surgery Breast and Colorectal Surgery Office:   [phone] Pager:   [phone]

## 2012-09-17 NOTE — Progress Notes (Signed)
Patient ID: Johnathan Bauer, male   DOB: 20-Feb-1951, 62 y.o.   MRN: [id number] 15 Days Post-Op  Subjective: Pt wo complaints, tolerating diet well, denies nausea or vomiting, +bowel function, no acute events, nurses reports patient still trying to get out of bed without assistance and is high risk for falls  Objective: Vital signs in last 24 hours: Temp:  [98.3 F (36.8 C)-99.3 F (37.4 C)] 98.8 F (37.1 C) (01/12 0459) Pulse Rate:  [71-98] 90  (01/12 0459) Resp:  [18] 18  (01/12 0459) BP: (141-164)/(78-95) 156/89 mmHg (01/12 0459) SpO2:  [94 %-100 %] 98 % (01/12 0459) Last BM Date: 09/16/12  Intake/Output from previous day: 01/11 0701 - 01/12 0700 In: 540 [P.O.:540] Out: 1110 [Urine:1000; Stool:110] Intake/Output this shift:    General appearance: alert, cooperative and no distress Cardio: irregularly irregular rhythm GI: wound clean, ostomy with output, soft, +BS EXT: improved BLE edema  Lab Results:   Basename 09/16/12 0500 09/15/12 0300  WBC 11.8* 12.4*  HGB 12.4* 11.5*  HCT 37.2* 34.4*  PLT 543* 490*   BMET  Basename 09/17/12 0635 09/16/12 0500  NA 133* 137  K 4.5 3.9  CL 98 102  CO2 20 26  GLUCOSE 136* 116*  BUN 20 18  CREATININE 1.98* 2.11*  CALCIUM 8.6 8.4   PT/INR No results found for this basename: LABPROT:2,INR:2 in the last 72 hours ABG No results found for this basename: PHART:2,PCO2:2,PO2:2,HCO3:2 in the last 72 hours  Studies/Results: Dg Chest Port 1 View  09/15/2012  *RADIOLOGY REPORT*  Clinical Data: Coarse breath sounds.  Chest congestion.  PORTABLE CHEST - 1 VIEW  Comparison: 09/13/2012.  Findings: The heart and mediastinal structures remain normal. Stable bibasilar infiltrates.  No new pulmonary parenchymal changes.  No effusions or pneumothoraces.  No acute bony changes.  IMPRESSION: No interval changes.  Bibasilar air space disease.   Original Report Authenticated By: Sander Radon, M.D.     Anti-infectives: Anti-infectives     Start      Dose/Rate Route Frequency Ordered Stop   09/13/12 0501   vancomycin (VANCOCIN) 1,250 mg in sodium chloride 0.9 % 250 mL IVPB  Status:  Discontinued        1,250 mg 166.7 mL/hr over 90 Minutes Intravenous Every 24 hours 09/12/12 1330 09/13/12 1351   09/06/12 1700   vancomycin (VANCOCIN) 1,250 mg in sodium chloride 0.9 % 250 mL IVPB  Status:  Discontinued        1,250 mg 166.7 mL/hr over 90 Minutes Intravenous Every 12 hours 09/06/12 1641 09/12/12 1330   09/06/12 1700   piperacillin-tazobactam (ZOSYN) IVPB 3.375 g  Status:  Discontinued        3.375 g 12.5 mL/hr over 240 Minutes Intravenous 3 times per day 09/06/12 1641 09/13/12 1351   09/04/12 2300   ertapenem (INVANZ) 1 g in sodium chloride 0.9 % 50 mL IVPB  Status:  Discontinued        1 g 100 mL/hr over 30 Minutes Intravenous  Once 09/03/12 0200 09/03/12 0240   09/03/12 0600   ceFAZolin (ANCEF) IVPB 1 g/50 mL premix  Status:  Discontinued     Comments: Send with pt to OR      1 g 100 mL/hr over 30 Minutes Intravenous On call 09/02/12 2140 09/02/12 2148   09/03/12 0600   ceFAZolin (ANCEF) IVPB 2 g/50 mL premix     Comments: Send with pt to OR      2 g 100 mL/hr over 30 Minutes Intravenous  On call 09/02/12 2148 09/02/12 2212   09/03/12 0400   ciprofloxacin (CIPRO) IVPB 400 mg  Status:  Discontinued        400 mg 200 mL/hr over 60 Minutes Intravenous 2 times daily 09/03/12 0241 09/06/12 1607   09/03/12 0400   metroNIDAZOLE (FLAGYL) IVPB 500 mg  Status:  Discontinued        500 mg 100 mL/hr over 60 Minutes Intravenous 3 times per day 09/03/12 0241 09/06/12 1607   09/03/12 0045   ciprofloxacin (CIPRO) IVPB 400 mg  Status:  Discontinued        400 mg 200 mL/hr over 60 Minutes Intravenous Every 12 hours 09/03/12 0035 09/03/12 0235   09/03/12 0045   metroNIDAZOLE (FLAGYL) IVPB 500 mg  Status:  Discontinued        500 mg 100 mL/hr over 60 Minutes Intravenous Every 8 hours 09/03/12 0035 09/03/12 0235           Assessment/Plan: s/p Procedure(s) (LRB) with comments: EXPLORATORY LAPAROTOMY (N/A) - Exploratory Laparotomy with Sigmoid resection with end colocstomy. COLON RESECTION SIGMOID (N/A) - Sigmoid resection with End Colostomy. S/P hartmans for perf diverticulitis POD#14 - bowel function returned, regular diet Deconditioning - PT/OT AKI - appreciate renal input, foley currently remaining in place, ?obstruction possibility, will speak with renal about removal of the foley vs leaving in with outpt follow up for this. ETOH WD - CIWA Delirium at night - Haldol PRN Hypokalemia - replace gently VTE - heparin A fib - appreciate cardiology F/U.  HTN - SBP presistently in the 160 range, already on metoprolol 50mg  twice daily (was on XL at home 50mg  daily), will add PRN apresoline for now and have cardiology follow up on this FL2 signed for eventual SNF  Dispo: patient should be medically and surgically ready for discharge to rehab tomorrow or Tuesday, will need to confirm with nephrology as to whether or not the foley can be discontinued   LOS: 15 days    WHITE, ELIZABETH 09/17/2012

## 2012-09-18 ENCOUNTER — Inpatient Hospital Stay (HOSPITAL_COMMUNITY): Payer: Managed Care, Other (non HMO)

## 2012-09-18 LAB — BASIC METABOLIC PANEL
BUN: 20 mg/dL (ref 6–23)
CO2: 24 mEq/L (ref 19–32)
Chloride: 99 mEq/L (ref 96–112)
Creatinine, Ser: 1.84 mg/dL — ABNORMAL HIGH (ref 0.50–1.35)
GFR calc Af Amer: 44 mL/min — ABNORMAL LOW (ref >90)
Potassium: 4.6 mEq/L (ref 3.5–5.1)

## 2012-09-18 LAB — CBC
Hemoglobin: 11.4 g/dL — ABNORMAL LOW (ref 13.0–17.0)
MCH: 32.7 pg (ref 26.0–34.0)
Platelets: 566 10*3/uL — ABNORMAL HIGH (ref 150–400)
RBC: 3.49 MIL/uL — ABNORMAL LOW (ref 4.22–5.81)
WBC: 10.6 10*3/uL — ABNORMAL HIGH (ref 4.0–10.5)

## 2012-09-18 MED ORDER — LEVOFLOXACIN 750 MG PO TABS
750.0000 mg | ORAL_TABLET | ORAL | Status: DC
  Administered 2012-09-18: 750 mg via ORAL
  Filled 2012-09-18: qty 1

## 2012-09-18 MED ORDER — IPRATROPIUM BROMIDE 0.02 % IN SOLN: 0.5000 mg | Freq: Four times a day (QID) | RESPIRATORY_TRACT | Status: DC | PRN

## 2012-09-18 NOTE — Progress Notes (Signed)
Foley removed.

## 2012-09-18 NOTE — Progress Notes (Signed)
NUTRITION FOLLOW UP  Intervention:   1. Continue Ensure Complete po BID to promote healing 2. RD provided handout regarding "Nutrition Therapy for Colostomy" to pt and family; reviewed information 3. RD to continue to follow nutrition care plan  Nutrition Dx:   Inadequate oral intake related to recent abdominal surgery and decreased appetite as evidenced by poor intake of meals.  Resolved.  New Nutrition Dx: Increased nutrient needs r/t post-op healing AEB estimated needs.  Goal:   Pt to meet >/= 90% of their estimated nutrition needs; met.  Monitor:   weight trends, lab trends, I/O's, PO intake, supplement tolerance  Assessment:   POD#14 exp-lap and sigmoid resection with end colostomy.  Required Mg and K repletion on 1/10. Nephrology consulted for AKI, appears to be improving per nephrologist, signed off 1/12. Planning to d/c to SNF soon.  Intake of meals remains variable, ranging from 25 - 50%. Currently ordered for Ensure Complete PO BID. Pt states that he enjoys this supplement and takes it regularly. Family at bedside asking for nutrition-related materials regarding colostomy. RD provided handout and reviewed foods recommended and not recommended. Pt would like to continue the Ensure supplements at this time. Denies any additional questions.  Height: Ht Readings from Last 1 Encounters:  09/06/12 6' (1.829 m)    Weight Status:   Wt Readings from Last 1 Encounters:  09/10/12 180 lb 1.9 oz (81.7 kg)    Re-estimated needs:  Kcal: 2000 - 2200  Protein: 105 - 125 grams Fluid: 2 - 2.2 liters daily  Skin: abdominal incision, skin tears on abdomen and left arm  Diet Order: Cardiac   Intake/Output Summary (Last 24 hours) at 09/18/12 1232 Last data filed at 09/18/12 1054  Gross per 24 hour  Intake    480 ml  Output   2800 ml  Net  -2320 ml    Last BM: 1/12   Labs:   Lab 09/18/12 0825 09/17/12 0635 09/16/12 0500 09/15/12 0300  NA 135 133* 137 --  K 4.6 4.5 3.9  --  CL 99 98 102 --  CO2 24 20 26  --  BUN 20 20 18  --  CREATININE 1.84* 1.98* 2.11* --  CALCIUM 8.3* 8.6 8.4 --  MG -- -- -- 1.1*  PHOS -- -- -- 3.2  GLUCOSE 121* 136* 116* --    CBG (last 3)  No results found for this basename: GLUCAP:3 in the last 72 hours  Scheduled Meds:   . aspirin EC  81 mg Oral Daily  . diltiazem  180 mg Oral Daily  . feeding supplement  237 mL Oral BID BM  . folic acid  1 mg Oral Daily  . heparin subcutaneous  5,000 Units Subcutaneous Q8H  . metoprolol tartrate  50 mg Oral Q12H  . nystatin cream   Topical BID  . pantoprazole  40 mg Oral Q1200  . prasugrel  10 mg Oral Daily  . thiamine  100 mg Oral Daily    Continuous Infusions:   Jarold Motto MS, RD, LDN Pager: [phone] After-hours pager: [phone]

## 2012-09-18 NOTE — Progress Notes (Signed)
ANTIBIOTIC CONSULT NOTE - INITIAL  Pharmacy Consult for Levaquin Indication: pneumonia  No Known Allergies  Patient Measurements: Height: 6' (182.9 cm) Weight: 180 lb 1.9 oz (81.7 kg) IBW/kg (Calculated) : 77.6   Vital Signs: Temp: 99 F (37.2 C) (01/13 1350) Temp src: Oral (01/13 1350) BP: 126/74 mmHg (01/13 1350) Pulse Rate: 79  (01/13 1350) Intake/Output from previous day: 01/12 0701 - 01/13 0700 In: 720 [P.O.:720] Out: 2700 [Urine:1700; Stool:1000] Intake/Output from this shift: Total I/O In: 480 [P.O.:480] Out: 1100 [Urine:800; Stool:300]  Labs:  [facility] 09/18/12 0825 09/17/12 0635 09/16/12 0500  WBC 10.6* -- 11.8*  HGB 11.4* -- 12.4*  PLT 566* -- 543*  LABCREA -- -- --  CREATININE 1.84* 1.98* 2.11*   Estimated Creatinine Clearance: 46.3 ml/min (by C-G formula based on Cr of 1.84). No results found for this basename: VANCOTROUGH:2,VANCOPEAK:2,VANCORANDOM:2,GENTTROUGH:2,GENTPEAK:2,GENTRANDOM:2,TOBRATROUGH:2,TOBRAPEAK:2,TOBRARND:2,AMIKACINPEAK:2,AMIKACINTROU:2,AMIKACIN:2, in the last 72 hours   Microbiology: Recent Results (from the past 720 hour(s))  MRSA PCR SCREENING     Status: Normal   Collection Time   09/03/12  3:00 AM      Component Value Range Status Comment   MRSA by PCR NEGATIVE  NEGATIVE Final   CULTURE, BLOOD (ROUTINE X 2)     Status: Normal   Collection Time   09/06/12  5:45 PM      Component Value Range Status Comment   Specimen Description BLOOD RIGHT HAND   Final    Special Requests BOTTLES DRAWN AEROBIC ONLY 10CC   Final    Culture  Setup Time 09/07/2012 01:19   Final    Culture NO GROWTH 5 DAYS   Final    Report Status 09/13/2012 FINAL   Final   CULTURE, BLOOD (ROUTINE X 2)     Status: Normal   Collection Time   09/06/12  5:46 PM      Component Value Range Status Comment   Specimen Description BLOOD RIGHT HAND   Final    Special Requests BOTTLES DRAWN AEROBIC ONLY 10CC   Final    Culture  Setup Time 09/07/2012 01:19   Final    Culture  NO GROWTH 5 DAYS   Final    Report Status 09/13/2012 FINAL   Final   CULTURE, RESPIRATORY     Status: Normal   Collection Time   09/07/12  5:21 PM      Component Value Range Status Comment   Specimen Description ENDOTRACHEAL   Final    Special Requests NONE   Final    Gram Stain     Final    Value: ABUNDANT WBC PRESENT,BOTH PMN AND MONONUCLEAR     NO SQUAMOUS EPITHELIAL CELLS SEEN     NO ORGANISMS SEEN   Culture FEW CANDIDA ALBICANS   Final    Report Status 09/10/2012 FINAL   Final     Medical History: Past Medical History  Diagnosis Date  . A-fib     chronic afib - did not like coumadin  . Coronary artery disease     s/p PCI Sept 2013 with DES OM  . Diverticulitis with perforation     Medications:  Scheduled:    . aspirin EC  81 mg Oral Daily  . diltiazem  180 mg Oral Daily  . feeding supplement  237 mL Oral BID BM  . folic acid  1 mg Oral Daily  . heparin subcutaneous  5,000 Units Subcutaneous Q8H  . metoprolol tartrate  50 mg Oral Q12H  . nystatin cream   Topical BID  .  pantoprazole  40 mg Oral Q1200  . prasugrel  10 mg Oral Daily  . thiamine  100 mg Oral Daily   Infusions:   Assessment: 62 y/o male patient pod#14 s/p hartmans for perforated diverticulitis requiring broad spectrum antibiotic for pneumonia. Patient has some acute renal injury, will adjust per kidney function.  Plan:  Levaquin 750mg  po q48h, monitor renal function and clinical progression.  Verlene Mayer, PharmD, BCPS Pager [phone] 09/18/2012,2:24 PM

## 2012-09-18 NOTE — Progress Notes (Signed)
Appears to also have pneumonia. Will start on Levaquin

## 2012-09-18 NOTE — Progress Notes (Signed)
This CSW no longer following as pt has transferred to another unit. New unit CSW to begin following and facilitating with dc plans.  Theresia Bough, MSW, Theresia Majors [phone]

## 2012-09-18 NOTE — Progress Notes (Signed)
Physical Therapy Treatment Patient Details Name: Johnathan Bauer MRN: [id number] DOB: [birth year] Today's Date: 09/18/2012 Time: 4098-1191 PT Time Calculation (min): 25 min  PT Assessment / Plan / Recommendation Comments on Treatment Session  Patient continues to make steady improvements towards PT goals.  Pt still demonstrates poor awareness of deficits and significant instability and coordination deficits with ambulation secondary to impulsivity and poor activity tolerance. Spoke with patient and wife who are in agreement with SNF recommendation    Follow Up Recommendations  SNF     Does the patient have the potential to tolerate intense rehabilitation     Barriers to Discharge        Equipment Recommendations  Rolling walker with 5" wheels    Recommendations for Other Services    Frequency Min 3X/week   Plan Discharge plan needs to be updated;Frequency remains appropriate    Precautions / Restrictions Precautions Precautions: Fall Restrictions Weight Bearing Restrictions: No   Pertinent Vitals/Pain 2/10; no real pain when at rest    Mobility  Bed Mobility Bed Mobility: Supine to Sit;Sitting - Scoot to Edge of Bed Supine to Sit: 4: Min guard Sitting - Scoot to [place] of Bed: 4: Min guard Details for Bed Mobility Assistance: seated EOB pulse OX 98 % on 2 liters Transfers Transfers: Sit to Stand;Stand to [employer] to Stand: 4: Min assist;From chair/3-in-1 Stand to Sit: 4: Min assist;To bed Details for Transfer Assistance: VC's for hand placement, SpO2 at 95% standing on rm air Ambulation/Gait Ambulation/Gait Assistance: 3: Mod assist Ambulation Distance (Feet): 60 Feet Assistive device: 1 person hand held assist Ambulation/Gait Assistance Details: Multiple LOB and max VC's for slow and controlled ambulation; pt impulsive and req max VC's for controlled breathing.  Saturation ranged from 96% on rm air to 87% on rm air with immediated rebound using pursed lip breathing  techniques Gait Pattern: Step-through pattern;Decreased stride length;Shuffle;Trunk flexed Gait velocity: impulsive and uncontrolled gait speed Stairs: No Wheelchair Mobility Wheelchair Mobility: No     PT Goals Acute Rehab PT Goals PT Goal Formulation: With patient Time For Goal Achievement: 09/24/12 Potential to Achieve Goals: Good Pt will go Sit to Stand: with supervision PT Goal: Sit to Stand - Progress: Progressing toward goal Pt will go Stand to Sit: with supervision PT Goal: Stand to Sit - Progress: Progressing toward goal Pt will Transfer Bed to Chair/Chair to Bed: with supervision Pt will Ambulate: >150 feet;with supervision;with least restrictive assistive device;Other (comment) PT Goal: Ambulate - Progress: Progressing toward goal  Visit Information  Last PT Received On: 09/18/12 Assistance Needed: +2    Subjective Data  Subjective: "I will do whatever I need to to get better" Patient Stated Goal: to be independent at home   Cognition  Overall Cognitive Status: Impaired Area of Impairment: Safety/judgement;Following commands;Problem solving;Awareness of deficits;Memory Arousal/Alertness: Awake/alert Orientation Level: Disoriented to;Place;Time Behavior During Session: [facility] for tasks performed Following Commands: Follows multi-step commands with increased time Safety/Judgement: Decreased awareness of safety precautions;Decreased safety judgement for tasks assessed;Impulsive Safety/Judgement - Other Comments: Imparied overall safety and judgement    Balance  Balance Balance Assessed: Yes Static Standing Balance Static Standing - Balance Support: Bilateral upper extremity supported Static Standing - Level of Assistance: 4: Min assist Static Standing - Comment/# of Minutes: 2 minutes  End of Session PT - End of Session Equipment Utilized During Treatment: Gait belt Activity Tolerance: Patient tolerated treatment well Patient left: in chair;with call bell/phone  within reach;with family/visitor present Nurse Communication: Mobility status   GP  Fabio Asa 09/18/2012, 1:14 PM Charlotte Crumb, PT DPT  [phone]

## 2012-09-18 NOTE — Clinical Social Work Note (Signed)
CSW called patient's wife to clarify SNFchoice and left a voice message to return call. CSW will continue to follow.   Rozetta Nunnery MSW, [employer] [phone]

## 2012-09-18 NOTE — Progress Notes (Signed)
16 Days Post-Op  Subjective: Pt feeling okay today, no pain in abdomen, c/o of cough and some SOB.  Pt using nebs and IS.  Having BM's, and urinating through cathter.  Pt ambulating with PT/OT.  IS to 1000 this am.  Objective: Vital signs in last 24 hours: Temp:  [98.1 F (36.7 C)-100.7 F (38.2 C)] 98.1 F (36.7 C) (01/13 0626) Pulse Rate:  [79-102] 90  (01/13 0626) Resp:  [18] 18  (01/13 0626) BP: (125-157)/(58-103) 157/90 mmHg (01/13 0626) SpO2:  [97 %-100 %] 97 % (01/13 0626) Last BM Date: 09/17/12  Intake/Output from previous day: 01/12 0701 - 01/13 0700 In: 720 [P.O.:720] Out: 2700 [Urine:1700; Stool:1000] Intake/Output this shift:    PE: Gen:  Alert, NAD, pleasant Card:  RRR, no M/G/R heard Pulm:  Low respiratory effort, +rhonchi/wheezing Abd: Soft, NT/ND, +BS, no HSM, incisions C/D/I, ostomy patent with good output   Lab Results:   Basename 09/16/12 0500  WBC 11.8*  HGB 12.4*  HCT 37.2*  PLT 543*   BMET  Basename 09/17/12 0635 09/16/12 0500  NA 133* 137  K 4.5 3.9  CL 98 102  CO2 20 26  GLUCOSE 136* 116*  BUN 20 18  CREATININE 1.98* 2.11*  CALCIUM 8.6 8.4   PT/INR No results found for this basename: LABPROT:2,INR:2 in the last 72 hours CMP     Component Value Date/Time   NA 133* 09/17/2012 0635   K 4.5 09/17/2012 0635   CL 98 09/17/2012 0635   CO2 20 09/17/2012 0635   GLUCOSE 136* 09/17/2012 0635   BUN 20 09/17/2012 0635   CREATININE 1.98* 09/17/2012 0635   CALCIUM 8.6 09/17/2012 0635   PROT 5.8* 09/15/2012 0300   ALBUMIN 2.1* 09/15/2012 0300   AST 27 09/15/2012 0300   ALT 15 09/15/2012 0300   ALKPHOS 342* 09/15/2012 0300   BILITOT 1.0 09/15/2012 0300   GFRNONAA 35* 09/17/2012 0635   GFRAA 40* 09/17/2012 0635   Lipase  No results found for this basename: lipase       Studies/Results: No results found.  Anti-infectives: Anti-infectives     Start     Dose/Rate Route Frequency Ordered Stop   09/13/12 0501   vancomycin (VANCOCIN) 1,250 mg in  sodium chloride 0.9 % 250 mL IVPB  Status:  Discontinued        1,250 mg 166.7 mL/hr over 90 Minutes Intravenous Every 24 hours 09/12/12 1330 09/13/12 1351   09/06/12 1700   vancomycin (VANCOCIN) 1,250 mg in sodium chloride 0.9 % 250 mL IVPB  Status:  Discontinued        1,250 mg 166.7 mL/hr over 90 Minutes Intravenous Every 12 hours 09/06/12 1641 09/12/12 1330   09/06/12 1700   piperacillin-tazobactam (ZOSYN) IVPB 3.375 g  Status:  Discontinued        3.375 g 12.5 mL/hr over 240 Minutes Intravenous 3 times per day 09/06/12 1641 09/13/12 1351   09/04/12 2300   ertapenem (INVANZ) 1 g in sodium chloride 0.9 % 50 mL IVPB  Status:  Discontinued        1 g 100 mL/hr over 30 Minutes Intravenous  Once 09/03/12 0200 09/03/12 0240   09/03/12 0600   ceFAZolin (ANCEF) IVPB 1 g/50 mL premix  Status:  Discontinued     Comments: Send with pt to OR      1 g 100 mL/hr over 30 Minutes Intravenous On call 09/02/12 2140 09/02/12 2148   09/03/12 0600   ceFAZolin (ANCEF) IVPB 2 g/50  mL premix     Comments: Send with pt to OR      2 g 100 mL/hr over 30 Minutes Intravenous On call 09/02/12 2148 09/02/12 2212   09/03/12 0400   ciprofloxacin (CIPRO) IVPB 400 mg  Status:  Discontinued        400 mg 200 mL/hr over 60 Minutes Intravenous 2 times daily 09/03/12 0241 09/06/12 1607   09/03/12 0400   metroNIDAZOLE (FLAGYL) IVPB 500 mg  Status:  Discontinued        500 mg 100 mL/hr over 60 Minutes Intravenous 3 times per day 09/03/12 0241 09/06/12 1607   09/03/12 0045   ciprofloxacin (CIPRO) IVPB 400 mg  Status:  Discontinued        400 mg 200 mL/hr over 60 Minutes Intravenous Every 12 hours 09/03/12 0035 09/03/12 0235   09/03/12 0045   metroNIDAZOLE (FLAGYL) IVPB 500 mg  Status:  Discontinued        500 mg 100 mL/hr over 60 Minutes Intravenous Every 8 hours 09/03/12 0035 09/03/12 0235           Assessment/Plan EXPLORATORY LAPAROTOMY (N/A) - Exploratory Laparotomy with Sigmoid resection with end  colocstomy.  COLON RESECTION SIGMOID (N/A) - Sigmoid resection with End Colostomy.  S/P hartmans for perf diverticulitis POD#14 - bowel function returned, regular diet  Deconditioning - PT/OT  AKI - appreciate renal input, d/c foley and prn in/out cath, ?obstruction possibility ETOH WD - CIWA  Delirium at night - has not needed haldol or ativan Hypokalemia - pending repeat lab A fib - appreciate cardiology F/U.  HTN - SBP presistently in the 160 range, already on metoprolol 50mg  twice daily (was on XL at home 50mg  daily), will add PRN apresoline for now and have cardiology follow up on this  Wheezing - duo neb, repeat cbc and cxr  VTE - heparin  Disp: Discharge to SNF today or Tuesday      LOS: 16 days    DORT, Raiyah Speakman 09/18/2012, 7:49 AM Pager: [phone]

## 2012-09-19 LAB — CBC
HCT: 36.5 % — ABNORMAL LOW (ref 39.0–52.0)
Hemoglobin: 12 g/dL — ABNORMAL LOW (ref 13.0–17.0)
MCV: 102 fL — ABNORMAL HIGH (ref 78.0–100.0)
RDW: 13.7 % (ref 11.5–15.5)
WBC: 8.6 10*3/uL (ref 4.0–10.5)

## 2012-09-19 LAB — BASIC METABOLIC PANEL
BUN: 21 mg/dL (ref 6–23)
CO2: 24 mEq/L (ref 19–32)
Chloride: 98 mEq/L (ref 96–112)
Creatinine, Ser: 1.87 mg/dL — ABNORMAL HIGH (ref 0.50–1.35)
Glucose, Bld: 93 mg/dL (ref 70–99)
Potassium: 3.8 mEq/L (ref 3.5–5.1)

## 2012-09-19 MED ORDER — ALBUTEROL SULFATE (5 MG/ML) 0.5% IN NEBU: 2.5000 mg | INHALATION_SOLUTION | Freq: Four times a day (QID) | RESPIRATORY_TRACT | Status: DC | PRN

## 2012-09-19 MED ORDER — LEVOFLOXACIN 750 MG PO TABS: 750.0000 mg | ORAL_TABLET | ORAL | Status: DC

## 2012-09-19 MED ORDER — IPRATROPIUM BROMIDE 0.02 % IN SOLN: 0.5000 mg | Freq: Four times a day (QID) | RESPIRATORY_TRACT | Status: DC | PRN

## 2012-09-19 MED ORDER — NYSTATIN 100000 UNIT/GM EX CREA: TOPICAL_CREAM | Freq: Two times a day (BID) | CUTANEOUS | Status: DC

## 2012-09-19 MED ORDER — HYDROCODONE-ACETAMINOPHEN 5-325 MG PO TABS: 1.0000 | ORAL_TABLET | Freq: Four times a day (QID) | ORAL | Status: DC | PRN

## 2012-09-19 NOTE — Clinical Social Work Note (Signed)
Clinical Social Worker followed up with patient's wife and she confirmed [facility] and [address] Facility will be able to accept patient today if medically stable. CSW will continue to follow.   Rozetta Nunnery MSW, [employer] [phone]

## 2012-09-19 NOTE — Discharge Summary (Signed)
Physician Discharge Summary  Patient ID: Johnathan Bauer MRN: [id number] DOB/AGE: [birth year] 62 y.o.  Admit date: 09/02/2012 Discharge date: 09/19/2012  Admitting Diagnosis: SMA thrombus, possible ischemic bowel, severe abdominal pain  Discharge Diagnosis Patient Active Problem List   Diagnosis Date Noted  . Alcohol dependence 09/19/2012  . Acute renal failure 09/13/2012  . Respiratory failure, post-operative 09/07/2012  . NSVT, 7 beat run 1/02 09/07/2012  . Anasarca 09/07/2012  . Pulmonary edema 09/07/2012  . Thrombocytopenia 09/07/2012  . Fever 09/07/2012  . Delirium tremens 09/06/2012  . Atrial fibrillation 09/03/2012  . 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 Perforated sigmoid colon 09/03/2012  . Diverticulitis of sigmoid colon 09/02/2012    Consultants Dr. Deterdin - Nephrology Dr. Hyman Hopes - Nephrology Dr. [place] - Cardiology Dr. Mayford Knife - Cardiology Dr. Graciela Husbands - Cardiology Dr. Myra Gianotti - Vascular Surgery Dr. Craige Cotta - Critical Care  Imaging: Dg Chest Port 1 View  09/18/2012  *RADIOLOGY REPORT*  Clinical Data: Chest pain and nonproductive cough  PORTABLE CHEST - 1 VIEW  Comparison: 09/15/2012  Findings: Progression of bilateral airspace disease.  This is probable pneumonia.  Heart size upper normal.  Vascularity is not congested.  Question small left effusion.  IMPRESSION: Progression of bibasilar infiltrates, consistent with pneumonia.   Original Report Authenticated By: Janeece Riggers, M.D.     Procedures Exploratory laparotomy, sigmoid colon resection, end colostomy   Hospital Course:  62 year old gentleman who was [facility] from [facility] to [facility] for abdominal pain. He states that the pain began yesterday morning. It was unbearable. He had emesis x3 as well as non-bloody diarrhea. There were no relieving or aggravating factors. 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 His lactate was 7.8. He was given a bolus of  heparin and transferred to [facility]. His pain improved with narcotics. He had rated it 10/10. He has a history of AFIB. He could not tolerate coumadin. He had a coronary stent placed in March for SOB. He is on Effient and ASA for his stent. He is a current smoker, although he has cut back since his stent.  The patient was admitted and underwent an Ex lap with Dr. Myra Gianotti and Dr. Derrell Lolling.  Intraoperatively the SMA was evaluated and no mesentery ischemia was noted therefore no interventions were needed to be made by vascular surgery.  Extensive diverticular disease and a perforated sigmoid colon was found, and therefore the above procedures were performed.  The patient tolerated the procedure well and was transferred to the ICU for monitoring.  While in the ICU he was followed by CCM.  On POD #3 he was transferred from the ICU to telemetry where cardiology followed his chronic atrial fibrillation, tachycardia, and CAD.  On POD #4 the patient developed DT's and CIWA protocol used.  On POD #9 he developed an acute renal injury likely due to contrast dye and/or vancymycin and was seen by nephrology.  His kidney function continued to improve over the next few days and by POD # 17 his creatinine was 1.87.  The patient needed medical restraints until POD #16 due to mental status changes and his DT's.  Diet was advanced as tolerated.  On POD #16, the patient had increased cough, sputum, and SOB.  On repeat CXR showed a pneumonia so he was started on a low dose/frequency of Levaquin.  On POD #16 his foley was discontinued to try voiding trials after experiencing some urinary retension.  [name]  was placed back on his Effient and discontinued heparin.    On POD #17, the patient was voiding well, tolerating diet, ambulating well, pain well controlled, vital signs stable, incisions c/d/i and felt stable for discharge to a SNF.  [name] in our office with Dr. Derrell Lolling in 2 weeks and knows to  call with questions or concerns.  He should also be urged to obtain a primary care provider and follow up with them upon discharge from the hospital.  He will need to continue his levofloxacin 750mg  every other day for 5 doses total.  He should continue duonebs as well.  He will need to have renal function checked on Friday (09/22/12).  He will need to follow up with his cardiologist and nephrologist upon discharge from SNF.        Medication List     As of 09/19/2012 10:48 AM    TAKE these medications         albuterol (5 MG/ML) 0.5% nebulizer solution   Commonly known as: PROVENTIL   Take 0.5 mLs (2.5 mg total) by nebulization every 6 (six) hours as needed for wheezing or shortness of breath.      aspirin EC 325 MG tablet   Take 325 mg by mouth daily.      digoxin 0.25 MG tablet   Commonly known as: LANOXIN   Take 0.25 mg by mouth daily.      HYDROcodone-acetaminophen 5-325 MG per tablet   Commonly known as: NORCO/VICODIN   Take 1-2 tablets by mouth every 6 (six) hours as needed for pain.      ipratropium 0.02 % nebulizer solution   Commonly known as: ATROVENT   Take 2.5 mLs (0.5 mg total) by nebulization every 6 (six) hours as needed.      metoprolol succinate 50 MG 24 hr tablet   Commonly known as: TOPROL-XL   Take 50 mg by mouth 2 (two) times daily. Take with or immediately following a meal.      nitroGLYCERIN 0.4 MG SL tablet   Commonly known as: NITROSTAT   Place 0.4 mg under the tongue every 5 (five) minutes as needed. x3 doses  For chest pain      nystatin cream   Commonly known as: MYCOSTATIN   Apply topically 2 (two) times daily.      prasugrel 10 MG Tabs   Commonly known as: EFFIENT   Take 10 mg by mouth daily.          Follow-up Information    Follow up with Pcp Not In System. (WILL NEED TO MAKE AN APPT WITH A PRIMARY CARE PROVIDER TO FOLLOW UP FROM PNEUMONIA)       Follow up with Lajean Saver, MD. Schedule an appointment as soon as possible  for a visit in 2 weeks.   Contact information:   [address] [place] [address] [phone]       Schedule an appointment as soon as possible for a visit with KRASOWSKI,ROBERT, MD. (regarding your heart)    Contact information:   [address] [place] [address] [phone]       Schedule an appointment as soon as possible for a visit with [facility].   Contact information:   [address] [place] [address] [phone]         Signed: Aris [place], [facility] Surgery [phone]  09/19/2012, 10:13 AM

## 2012-09-19 NOTE — Progress Notes (Signed)
Report called to Aggie Cosier, LPN at [facility].  [name] [place]

## 2012-09-19 NOTE — Progress Notes (Signed)
17 Days Post-Op  Subjective: Pt feeling much better today.  Breathing treatments and antibiotics helping.    Objective: Vital signs in last 24 hours: Temp:  [98.7 F (37.1 C)-99.8 F (37.7 C)] 98.7 F (37.1 C) (01/14 0600) Pulse Rate:  [75-81] 75  (01/14 0600) Resp:  [16-20] 16  (01/14 0600) BP: (126-132)/(74-79) 130/79 mmHg (01/14 0600) SpO2:  [98 %-100 %] 100 % (01/14 0600) Last BM Date: 09/17/12  Intake/Output from previous day: 01/13 0701 - 01/14 0700 In: 720 [P.O.:720] Out: 2155 [Urine:1775; Stool:380] Intake/Output this shift: Total I/O In: -  Out: 500 [Urine:500]  PE: Gen: Alert, NAD, pleasant  Card: RRR, no M/G/R heard  Pulm: better respiratory effort, +rhonchi/wheezing , IS at 1500-1750 Abd: Soft, NT/ND, +BS, no HSM, incisions C/D/I, ostomy patent with good output   Lab Results:   [facility] 09/18/12 0825  WBC 10.6*  HGB 11.4*  HCT 35.7*  PLT 566*   BMET  Basename 09/18/12 0825 09/17/12 0635  NA 135 133*  K 4.6 4.5  CL 99 98  CO2 24 20  GLUCOSE 121* 136*  BUN 20 20  CREATININE 1.84* 1.98*  CALCIUM 8.3* 8.6   PT/INR No results found for this basename: LABPROT:2,INR:2 in the last 72 hours CMP     Component Value Date/Time   NA 135 09/18/2012 0825   K 4.6 09/18/2012 0825   CL 99 09/18/2012 0825   CO2 24 09/18/2012 0825   GLUCOSE 121* 09/18/2012 0825   BUN 20 09/18/2012 0825   CREATININE 1.84* 09/18/2012 0825   CALCIUM 8.3* 09/18/2012 0825   PROT 5.8* 09/15/2012 0300   ALBUMIN 2.1* 09/15/2012 0300   AST 27 09/15/2012 0300   ALT 15 09/15/2012 0300   ALKPHOS 342* 09/15/2012 0300   BILITOT 1.0 09/15/2012 0300   GFRNONAA 38* 09/18/2012 0825   GFRAA 44* 09/18/2012 0825   Lipase  No results found for this basename: lipase       Studies/Results: Dg Chest Port 1 View  09/18/2012  *RADIOLOGY REPORT*  Clinical Data: Chest pain and nonproductive cough  PORTABLE CHEST - 1 VIEW  Comparison: 09/15/2012  Findings: Progression of bilateral airspace disease.  This  is probable pneumonia.  Heart size upper normal.  Vascularity is not congested.  Question small left effusion.  IMPRESSION: Progression of bibasilar infiltrates, consistent with pneumonia.   Original Report Authenticated By: Janeece Riggers, M.D.     Anti-infectives: Anti-infectives     Start     Dose/Rate Route Frequency Ordered Stop   09/18/12 1500   levofloxacin (LEVAQUIN) tablet 750 mg        750 mg Oral Every 48 hours 09/18/12 1428     09/13/12 0501   vancomycin (VANCOCIN) 1,250 mg in sodium chloride 0.9 % 250 mL IVPB  Status:  Discontinued        1,250 mg 166.7 mL/hr over 90 Minutes Intravenous Every 24 hours 09/12/12 1330 09/13/12 1351   09/06/12 1700   vancomycin (VANCOCIN) 1,250 mg in sodium chloride 0.9 % 250 mL IVPB  Status:  Discontinued        1,250 mg 166.7 mL/hr over 90 Minutes Intravenous Every 12 hours 09/06/12 1641 09/12/12 1330   09/06/12 1700   piperacillin-tazobactam (ZOSYN) IVPB 3.375 g  Status:  Discontinued        3.375 g 12.5 mL/hr over 240 Minutes Intravenous 3 times per day 09/06/12 1641 09/13/12 1351   09/04/12 2300   ertapenem (INVANZ) 1 g in sodium chloride 0.9 % 50  mL IVPB  Status:  Discontinued        1 g 100 mL/hr over 30 Minutes Intravenous  Once 09/03/12 0200 09/03/12 0240   09/03/12 0600   ceFAZolin (ANCEF) IVPB 1 g/50 mL premix  Status:  Discontinued     Comments: Send with pt to OR      1 g 100 mL/hr over 30 Minutes Intravenous On call 09/02/12 2140 09/02/12 2148   09/03/12 0600   ceFAZolin (ANCEF) IVPB 2 g/50 mL premix     Comments: Send with pt to OR      2 g 100 mL/hr over 30 Minutes Intravenous On call 09/02/12 2148 09/02/12 2212   09/03/12 0400   ciprofloxacin (CIPRO) IVPB 400 mg  Status:  Discontinued        400 mg 200 mL/hr over 60 Minutes Intravenous 2 times daily 09/03/12 0241 09/06/12 1607   09/03/12 0400   metroNIDAZOLE (FLAGYL) IVPB 500 mg  Status:  Discontinued        500 mg 100 mL/hr over 60 Minutes Intravenous 3 times per day  09/03/12 0241 09/06/12 1607   09/03/12 0045   ciprofloxacin (CIPRO) IVPB 400 mg  Status:  Discontinued        400 mg 200 mL/hr over 60 Minutes Intravenous Every 12 hours 09/03/12 0035 09/03/12 0235   09/03/12 0045   metroNIDAZOLE (FLAGYL) IVPB 500 mg  Status:  Discontinued        500 mg 100 mL/hr over 60 Minutes Intravenous Every 8 hours 09/03/12 0035 09/03/12 0235           Assessment/Plan EXPLORATORY LAPAROTOMY (N/A) - Exploratory Laparotomy with Sigmoid resection with end colocstomy.  COLON RESECTION SIGMOID (N/A) - Sigmoid resection with End Colostomy.   S/P hartmans for perf diverticulitis POD#17 - bowel function returned, regular diet  Deconditioning - PT/OT  AKI - appreciate renal input, urinating on own ETOH WD - CIWA  Delirium at night - has not needed haldol or ativan  Hypokalemia - stable A fib - appreciate cardiology F/U HTN - BP's stable Wheezing - improved from yesterday, but still significant, duo neb, repeat cbc VTE - heparin  Disp: Discharge to SNF today?  Will switch to PO abx when d/c.  When pt's wife arrives can page me.  **Will need continued renal function labs at SNF to ensure Levaquin doesn't bump kidney function    LOS: 17 days    DORT, Zaneta [employer] 09/19/2012, 8:24 AM Pager: [phone]

## 2012-09-19 NOTE — Progress Notes (Signed)
Physical Therapy Treatment Patient Details Name: Johnathan Bauer MRN: [id number] DOB: [birth year] Today's Date: 09/19/2012 Time: 5284-1324 PT Time Calculation (min): 25 min  PT Assessment / Plan / Recommendation Comments on Treatment Session  Pt progressing towards PT goals, demonstrates increased ambulation distance with intermittant rest breaks.  Able to demonstrates proper pursed lip breathing techniques during rest.  Spoke with patient regarding continued expectations for mobility and educated on importance of continued use of flutter and inspirometer.  Will continue to progress activity as tolerated.    Follow Up Recommendations  SNF     Does the patient have the potential to tolerate intense rehabilitation     Barriers to Discharge        Equipment Recommendations  Rolling walker with 5" wheels    Recommendations for Other Services    Frequency Min 3X/week   Plan Discharge plan needs to be updated;Frequency remains appropriate    Precautions / Restrictions Precautions Precautions: Fall Restrictions Weight Bearing Restrictions: No       Mobility  Bed Mobility Bed Mobility: Sit to Supine Sit to Supine: 4: Min guard Transfers Transfers: Sit to Stand;Stand to Sit Sit to Stand: 4: Min assist;From chair/3-in-1;With armrests Stand to Sit: 4: Min guard;To bed Details for Transfer Assistance: VC's for hand placement Ambulation/Gait Ambulation/Gait Assistance: 4: Min assist Ambulation Distance (Feet): 100 Feet Assistive device: Rolling walker Ambulation/Gait Assistance Details: Pt with some unsteadiness requiring (A) and VC's for upright posture and controlled gait speed Gait Pattern: Step-through pattern;Decreased stride length;Shuffle;Trunk flexed Gait velocity: impulsive and uncontrolled gait speed Stairs: No Wheelchair Mobility Wheelchair Mobility: No      PT Goals Acute Rehab PT Goals PT Goal Formulation: With patient Time For Goal Achievement: 09/24/12 Potential  to Achieve Goals: Good Pt will go Sit to Stand: with supervision PT Goal: Sit to Stand - Progress: Progressing toward goal Pt will go Stand to Sit: with supervision PT Goal: Stand to Sit - Progress: Progressing toward goal Pt will Transfer Bed to Chair/Chair to Bed: with supervision Pt will Ambulate: >150 feet;with supervision;with least restrictive assistive device;Other (comment) PT Goal: Ambulate - Progress: Progressing toward goal  Visit Information  Last PT Received On: 09/19/12 Assistance Needed: +1    Subjective Data  Subjective: "feeling fair and partly cloudy" Patient Stated Goal: to be independent at home   Cognition  Overall Cognitive Status: Appears within functional limits for tasks assessed/performed Arousal/Alertness: Awake/alert Orientation Level: Appears intact for tasks assessed Behavior During Session: [facility] for tasks performed    Balance  Balance Balance Assessed: Yes Static Standing Balance Static Standing - Balance Support: Bilateral upper extremity supported Static Standing - Level of Assistance: 5: Stand by assistance Static Standing - Comment/# of Minutes: 3 minutes while resting with breathing activities High Level Balance High Level Balance Activites: Side stepping;Backward walking;Direction changes;Turns;Sudden stops;Head turns High Level Balance Comments: patient with increased stability compared to previous session (using rw)  End of Session PT - End of Session Equipment Utilized During Treatment: Gait belt Activity Tolerance: Patient tolerated treatment well Patient left: in bed;with call bell/phone within reach Nurse Communication: Mobility status   GP     Fabio Asa 09/19/2012, 10:43 AM Charlotte [place], PT DPT  [phone]

## 2012-09-19 NOTE — Progress Notes (Signed)
Pt discharged in stable condition via ambulance to SNF.  Discharge packet sent with pt.  [name] [place]

## 2012-10-02 ENCOUNTER — Encounter (INDEPENDENT_AMBULATORY_CARE_PROVIDER_SITE_OTHER): Payer: Self-pay | Admitting: General Surgery

## 2012-10-02 ENCOUNTER — Ambulatory Visit (INDEPENDENT_AMBULATORY_CARE_PROVIDER_SITE_OTHER): Payer: Managed Care, Other (non HMO) | Admitting: General Surgery

## 2012-10-02 ENCOUNTER — Other Ambulatory Visit (INDEPENDENT_AMBULATORY_CARE_PROVIDER_SITE_OTHER): Payer: Self-pay | Admitting: General Surgery

## 2012-10-02 VITALS — BP 102/82 | HR 65 | Temp 96.4°F | Resp 18 | Ht 72.0 in | Wt 149.8 lb

## 2012-10-02 DIAGNOSIS — Z9049 Acquired absence of other specified parts of digestive tract: Secondary | ICD-10-CM

## 2012-10-02 DIAGNOSIS — Z9889 Other specified postprocedural states: Secondary | ICD-10-CM

## 2012-10-02 DIAGNOSIS — K5792 Diverticulitis of intestine, part unspecified, without perforation or abscess without bleeding: Secondary | ICD-10-CM

## 2012-10-02 NOTE — Progress Notes (Signed)
Patient ID: Johnathan Bauer, male   DOB: 20-Dec-1950, 62 y.o.   MRN: [id number] The patient is a 62 year old male status post sigmoid resection after a Hinchey.-type 3 perforated diverticulitis. Postop the patient had a long hospital stay. Patient subsequently sent to rehabilitation and doing well and has been home for 2 weeks at this point.  Patient's had no issues with his midline wound or his ostomy. Patient states at this time he has not had a Colonoscopy in the past.  On exam: His wound is clean dry and intact, ostomie pink and patent  Assessment and plan: This is 62 year old male status post exploratory laparoscopy with sigmoid resection and Hartman's pouch.  The patient has not had a previous colonoscopy. We will refer him for screening colonoscopy in 2-3 weeks. The patient subsequentl to this will require followup in to 3 months for plan of his colostomy takedown and reanastomosis.  At this time will get a barium enema to evaluate the length of his rectal stump.

## 2012-10-03 ENCOUNTER — Encounter: Payer: Self-pay | Admitting: Gastroenterology

## 2012-10-05 ENCOUNTER — Encounter (INDEPENDENT_AMBULATORY_CARE_PROVIDER_SITE_OTHER): Payer: Self-pay | Admitting: General Surgery

## 2012-10-13 ENCOUNTER — Ambulatory Visit (INDEPENDENT_AMBULATORY_CARE_PROVIDER_SITE_OTHER): Payer: Managed Care, Other (non HMO) | Admitting: Gastroenterology

## 2012-10-13 ENCOUNTER — Other Ambulatory Visit (INDEPENDENT_AMBULATORY_CARE_PROVIDER_SITE_OTHER): Payer: Managed Care, Other (non HMO)

## 2012-10-13 ENCOUNTER — Encounter: Payer: Self-pay | Admitting: Gastroenterology

## 2012-10-13 VITALS — BP 90/70 | HR 76 | Ht 69.0 in | Wt 150.5 lb

## 2012-10-13 DIAGNOSIS — K5792 Diverticulitis of intestine, part unspecified, without perforation or abscess without bleeding: Secondary | ICD-10-CM

## 2012-10-13 DIAGNOSIS — K5732 Diverticulitis of large intestine without perforation or abscess without bleeding: Secondary | ICD-10-CM

## 2012-10-13 LAB — COMPREHENSIVE METABOLIC PANEL
ALT: 24 U/L (ref 0–53)
Alkaline Phosphatase: 113 U/L (ref 39–117)
CO2: 25 mEq/L (ref 19–32)
Creatinine, Ser: 1.5 mg/dL (ref 0.4–1.5)
GFR: 49.66 mL/min — ABNORMAL LOW (ref >60.00)
Sodium: 135 mEq/L (ref 135–145)
Total Bilirubin: 1 mg/dL (ref 0.3–1.2)

## 2012-10-13 LAB — CBC WITH DIFFERENTIAL/PLATELET
Basophils Absolute: 0 10*3/uL (ref 0.0–0.1)
Eosinophils Absolute: 0.3 10*3/uL (ref 0.0–0.7)
HCT: 39.2 % (ref 39.0–52.0)
Hemoglobin: 13.2 g/dL (ref 13.0–17.0)
Lymphs Abs: 1.9 10*3/uL (ref 0.7–4.0)
MCHC: 33.7 g/dL (ref 30.0–36.0)
MCV: 96.1 fl (ref 78.0–100.0)
Monocytes Absolute: 1.1 10*3/uL — ABNORMAL HIGH (ref 0.1–1.0)
Monocytes Relative: 14.6 % — ABNORMAL HIGH (ref 3.0–12.0)
Neutro Abs: 3.9 10*3/uL (ref 1.4–7.7)
Platelets: 286 10*3/uL (ref 150.0–400.0)
RDW: 14.4 % (ref 11.5–14.6)

## 2012-10-13 MED ORDER — PEG-KCL-NACL-NASULF-NA ASC-C 100 G PO SOLR: 1.0000 | Freq: Once | ORAL | Status: DC

## 2012-10-13 NOTE — Progress Notes (Signed)
HPI: This is a  very pleasant 62 year old man whom I am meeting for the first time today.  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 His lactate was 7.8. He was given a bolus of heparin and transferred to [facility]. His pain improved with narcotics.  The patient was admitted and underwent an Ex lap with Dr. Myra Gianotti and Dr. Derrell Lolling. Intraoperatively the SMA was evaluated and no mesentery ischemia was noted therefore no interventions were needed to be made by vascular surgery. Extensive diverticular disease and a perforated sigmoid colon was found sigmoid colon resection, end colostomy with Hartmans was performed. He had prolonged hosp stay and short time at rehab after that.  Has been home now for 11 days. Getting around well.  Has mild SOB but otheriwse feels weel just low stamina.    Review of systems: Pertinent positive and negative review of systems were noted in the above HPI section. Complete review of systems was performed and was otherwise normal.    Past Medical History  Diagnosis Date  . A-fib     chronic afib - did not like coumadin  . Coronary artery disease     s/p PCI Sept 2013 with DES OM  . Diverticulitis with perforation     Past Surgical History  Procedure Date  . Cardiac catheterization   . Laparotomy 09/02/2012    Procedure: EXPLORATORY LAPAROTOMY;  Surgeon: Nada Libman, MD;  Location: [facility] OR;  Service: Vascular;  Laterality: N/A;  Exploratory Laparotomy with Sigmoid resection with end colocstomy.  . Colostomy revision 09/02/2012    Procedure: COLON RESECTION SIGMOID;  Surgeon: Nada Libman, MD;  Location: [facility] OR;  Service: Vascular;  Laterality: N/A;  Sigmoid resection with End Colostomy.    Current Outpatient Prescriptions  Medication Sig Dispense Refill  . aspirin EC 325 MG tablet Take 325 mg by mouth daily.      . digoxin (LANOXIN) 0.25 MG tablet Take 0.25  mg by mouth daily.      . metoprolol succinate (TOPROL-XL) 50 MG 24 hr tablet Take 50 mg by mouth 2 (two) times daily. Take with or immediately following a meal.      . prasugrel (EFFIENT) 10 MG TABS Take 10 mg by mouth daily.      . nitroGLYCERIN (NITROSTAT) 0.4 MG SL tablet Place 0.4 mg under the tongue every 5 (five) minutes as needed. x3 doses For chest pain        Allergies as of 10/13/2012 - Review Complete 10/13/2012  Allergen Reaction Noted  . Poison ivy extract (extract of poison ivy)  10/13/2012    Family History  Problem Relation Age of Onset  . Lymphoma Father   . Prostate cancer Paternal Grandfather     History   Social History  . Marital Status: Married    Spouse Name: N/A    Number of Children: 2  . Years of Education: N/A   Occupational History  . tax assesor    Social History Main Topics  . Smoking status: Former Smoker -- 0.2 packs/day for 40 years    Types: Cigarettes    Quit date: 09/02/2012  . Smokeless tobacco: Never Used  . Alcohol Use: No     Comment: Daily   2 to  5 beers..quit 09/02/12  . Drug Use: No  . Sexually Active:    Other Topics Concern  . Not  on file   Social History Narrative  . No narrative on file       Physical Exam: BP 90/70  Pulse 76  Ht 5\' 9"  (1.753 m)  Wt 150 lb 8 oz (68.266 kg)  BMI 22.22 kg/m2 Constitutional: generally well-appearing Psychiatric: alert and oriented x3 Eyes: extraocular movements intact Mouth: oral pharynx moist, no lesions Neck: supple no lymphadenopathy Cardiovascular: heart regular rate and rhythm Lungs: clear to auscultation bilaterally Abdomen: soft, nontender, nondistended, no obvious ascites, no peritoneal signs, normal bowel sounds: Left-sided colostomy bag with nice pink stoma, brown liquid stool inside Extremities: no lower extremity edema bilaterally Skin: no lesions on visible extremities    Assessment and plan: 62 y.o. male with  recent severe diverticulitis requiring  emergent segmental colectomy, Hartman's colostomy  He has never had colon cancer screening and so should undergo colonoscopy via the colostomy bag as well as via anus the next few weeks. He is on effient and we will communicate with his cardiologist about holding that medicine for 5-7 days prior to colonoscopy. He can stay on aspirin full dose throughout that time. If his cardiologist is unwilling to let him hold the effient then I think it would be most prudent to continue to proceed with a colonoscopy knowing that any polyps I find I would probably not going to safely remove due to the risk of bleeding. He did at least be surveyed for significant neoplastic lesions such as cancer. If none are found he could continue with plans to take down his colostomy bag and I could electively repeat colonoscopy when it is safe to hold at blood thinning medicine in the future.

## 2012-10-13 NOTE — Patient Instructions (Addendum)
We will contact your cardiologist about holding your effient for 5-7 days prior to your colonoscopy (Dr. Franki Monte at [facility] in [place]). You can, should continue aspirin during that time. You will be set up for a colonoscopy via anus and colostomy (for recent severe diverticulitis) (LEC, moderate sedation) in early March. You will have labs checked today in the basement lab.  Please head down after you check out with the front desk  (cbc, cmet). A copy of this information will be made available to Dr. Egbert Garibaldi [facility] in [place], [place]).

## 2012-10-26 ENCOUNTER — Telehealth: Payer: Self-pay | Admitting: Gastroenterology

## 2012-10-26 NOTE — Telephone Encounter (Signed)
Pt was given lab results and had asked to reschedule the procedure and his wife stated it was ok to leave it as is so we changed it back to the original date.

## 2012-10-30 ENCOUNTER — Telehealth: Payer: Self-pay

## 2012-10-30 NOTE — Telephone Encounter (Signed)
Dr Edson Snowball nurse will get a response today after he returns from the hospital.

## 2012-10-30 NOTE — Telephone Encounter (Signed)
Message copied by Donata Duff on Mon Oct 30, 2012  8:22 AM ------      Message from: Donata Duff      Created: Fri Oct 13, 2012 10:58 AM       Anti coag letter 11/06/12 ------

## 2012-11-01 ENCOUNTER — Other Ambulatory Visit: Payer: Self-pay

## 2012-11-01 NOTE — Telephone Encounter (Signed)
Dr Christella Hartigan Dr Clovis Fredrickson says the pt can NOT hold his Effient he had a stent placed last Sept.  Do you want him to still have the colonoscopy?

## 2012-11-01 NOTE — Telephone Encounter (Signed)
Pt has been notified.

## 2012-11-01 NOTE — Telephone Encounter (Signed)
Yes, we should do the colonoscopy ON effient. Thanks

## 2012-11-01 NOTE — Telephone Encounter (Signed)
Pt has not received any information from Dr Kirtland Bouchard

## 2012-11-01 NOTE — Telephone Encounter (Signed)
Left message on machine to call back  

## 2012-11-06 ENCOUNTER — Other Ambulatory Visit: Payer: Managed Care, Other (non HMO) | Admitting: Gastroenterology

## 2012-11-06 ENCOUNTER — Encounter: Payer: Self-pay | Admitting: Gastroenterology

## 2012-11-06 ENCOUNTER — Ambulatory Visit (AMBULATORY_SURGERY_CENTER): Payer: Managed Care, Other (non HMO) | Admitting: Gastroenterology

## 2012-11-06 VITALS — BP 133/63 | HR 105 | Resp 16 | Ht 69.0 in | Wt 150.0 lb

## 2012-11-06 DIAGNOSIS — K5732 Diverticulitis of large intestine without perforation or abscess without bleeding: Secondary | ICD-10-CM

## 2012-11-06 DIAGNOSIS — D126 Benign neoplasm of colon, unspecified: Secondary | ICD-10-CM

## 2012-11-06 MED ORDER — SODIUM CHLORIDE 0.9 % IV SOLN: 500.0000 mL | INTRAVENOUS | Status: DC

## 2012-11-06 NOTE — Progress Notes (Signed)
Patient did not experience any of the following events: a burn prior to discharge; a fall within the facility; wrong site/side/patient/procedure/implant event; or a hospital transfer or hospital admission upon discharge from the facility. (G8907) Patient did not have preoperative order for IV antibiotic SSI prophylaxis. (G8918)  

## 2012-11-06 NOTE — Progress Notes (Signed)
Wife applying new appliance and bag over stoma.

## 2012-11-06 NOTE — Patient Instructions (Addendum)
Continue your effient today.  .YOU HAD AN ENDOSCOPIC PROCEDURE TODAY AT THE [facility]: Refer to the procedure report that was given to you for any specific questions about what was found during the examination.  If the procedure report does not answer your questions, please call your gastroenterologist to clarify.  If you requested that your care partner not be given the details of your procedure findings, then the procedure report has been included in a sealed envelope for you to review at your convenience later.  YOU SHOULD EXPECT: Some feelings of bloating in the abdomen. Passage of more gas than usual.  Walking can help get rid of the air that was put into your GI tract during the procedure and reduce the bloating. If you had a lower endoscopy (such as a colonoscopy or flexible sigmoidoscopy) you may notice spotting of blood in your stool or on the toilet paper. If you underwent a bowel prep for your procedure, then you may not have a normal bowel movement for a few days.  DIET: Your first meal following the procedure should be a light meal and then it is ok to progress to your normal diet.  A half-sandwich or bowl of soup is an example of a good first meal.  Heavy or fried foods are harder to digest and may make you feel nauseous or bloated.  Likewise meals heavy in dairy and vegetables can cause extra gas to form and this can also increase the bloating.  Drink plenty of fluids but you should avoid alcoholic beverages for 24 hours.  ACTIVITY: Your care partner should take you home directly after the procedure.  You should plan to take it easy, moving slowly for the rest of the day.  You can resume normal activity the day after the procedure however you should NOT DRIVE or use heavy machinery for 24 hours (because of the sedation medicines used during the test).    SYMPTOMS TO REPORT IMMEDIATELY: A gastroenterologist can be reached at any hour.  During normal business hours, 8:30 AM to  5:00 PM Monday through Friday, call [phone].  After hours and on weekends, please call the GI answering service at [phone] who will take a message and have the physician on call contact you.   Following lower endoscopy (colonoscopy or flexible sigmoidoscopy):  Excessive amounts of blood in the stool  Significant tenderness or worsening of abdominal pains  Swelling of the abdomen that is new, acute  Fever of 100F or higher   FOLLOW UP: If any biopsies were taken you will be contacted by phone or by letter within the next 1-3 weeks.  Call your gastroenterologist if you have not heard about the biopsies in 3 weeks.  Our staff will call the home number listed on your records the next business day following your procedure to check on you and address any questions or concerns that you may have at that time regarding the information given to you following your procedure. This is a courtesy call and so if there is no answer at the home number and we have not heard from you through the emergency physician on call, we will assume that you have returned to your regular daily activities without incident.  SIGNATURES/CONFIDENTIALITY: You and/or your care partner have signed paperwork which will be entered into your electronic medical record.  These signatures attest to the fact that that the information above on your After Visit Summary has been reviewed and is understood.  Full  responsibility of the confidentiality of this discharge information lies with you and/or your care-partner.

## 2012-11-06 NOTE — Op Note (Signed)
[facility] [address] [place], [address]   COLONOSCOPY PROCEDURE REPORT  PATIENT: Johnathan Bauer, Johnathan Bauer  MR#: [id number] BIRTHDATE: [birth year] , 61  yrs. old GENDER: Male ENDOSCOPIST: Rachael Fee, MD REFERRED BY: PROCEDURE DATE:  11/06/2012 PROCEDURE:   Colonoscopy with snare polypectomy ASA CLASS:   Class III INDICATIONS:severe diverticulitis, s/p diverting coloscopy, segmenta resection of sigmoid colon. MEDICATIONS: Fentanyl 50 mcg IV, Versed 6 mg IV, and These medications were titrated to patient response per physician's verbal order  DESCRIPTION OF PROCEDURE:   After the risks benefits and alternatives of the procedure were thoroughly explained, informed consent was obtained.  A digital rectal exam revealed no abnormalities of the rectum.   The LB [id number]  endoscope was introduced through the anus and advanced to the cecum, which was identified by both the appendix and ileocecal valve. No adverse events experienced.   The quality of the prep was good, using MoviPrep  The instrument was then slowly withdrawn as the colon was fully examined.  COLON FINDINGS: The Hartman's pouch was normal except for some small mucous build up and minor distal diversion colitis.  There were 4 polyps in colon.  Three of them were removed, sent to pathology. One was in cecum, 3mm across, sessile, removed with cold snare. One was in transverse, sessile 5mm, removed also with cold snare. One was semipedunculated, 8mm, removed with cold snare.  There was immediate oozing of blood at the polypectomy site, more than usual. This was treated very effectively with placement of a since endoclip.  The last polyp was semipeduculated, 6-63mm, located just inside the ostomy (about 2cm from skin) and I did not remove this polyp.  The examination was otherwise normal.  Retroflexed views revealed no abnormalities. The time to cecum=4 minutes 42 seconds. Withdrawal time=14 minutes  32 seconds.  The scope was withdrawn and the procedure completed. COMPLICATIONS: There were no complications.  ENDOSCOPIC IMPRESSION: Four polyps, three of them were removed and sent to pathology The examination (via anus and via ostomy)  was otherwise normal.  RECOMMENDATIONS: Await final pathology. You will likely need repeat colonoscopy in 3 years. Will communicate with Dr.  Derrell Lolling about removing distal 2-3cm of ostomy at time of ostomy take-down.  This will effectively removed the last polyp.   eSigned:  Rachael Fee, MD 11/06/2012 10:13 AM    PATIENT NAME:  Johnathan Bauer, Johnathan Bauer MR#: [id number]

## 2012-11-07 ENCOUNTER — Telehealth: Payer: Self-pay | Admitting: *Deleted

## 2012-11-07 NOTE — Telephone Encounter (Signed)
  Follow up Call-  Call back number 11/06/2012  Post procedure Call Back phone  # [phone]  Permission to leave phone message Yes     Patient questions:  Do you have a fever, pain , or abdominal swelling? no Pain Score  0 *  Have you tolerated food without any problems? yes  Have you been able to return to your normal activities? yes  Do you have any questions about your discharge instructions: Diet   no Medications  no Follow up visit  no  Do you have questions or concerns about your Care? no  Actions: * If pain score is 4 or above: No action needed, pain <4.

## 2012-11-14 ENCOUNTER — Encounter: Payer: Self-pay | Admitting: Gastroenterology

## 2012-11-21 ENCOUNTER — Encounter (INDEPENDENT_AMBULATORY_CARE_PROVIDER_SITE_OTHER): Payer: Managed Care, Other (non HMO) | Admitting: General Surgery

## 2012-11-22 ENCOUNTER — Encounter (INDEPENDENT_AMBULATORY_CARE_PROVIDER_SITE_OTHER): Payer: Managed Care, Other (non HMO) | Admitting: General Surgery

## 2012-11-27 ENCOUNTER — Ambulatory Visit (HOSPITAL_COMMUNITY)
Admission: RE | Admit: 2012-11-27 | Discharge: 2012-11-27 | Disposition: A | Discharge status: Home / Self Care | Payer: Managed Care, Other (non HMO) | Source: Ambulatory Visit | Attending: General Surgery | Admitting: General Surgery

## 2012-11-27 ENCOUNTER — Other Ambulatory Visit: Payer: Managed Care, Other (non HMO) | Admitting: Gastroenterology

## 2012-11-27 DIAGNOSIS — K5792 Diverticulitis of intestine, part unspecified, without perforation or abscess without bleeding: Secondary | ICD-10-CM

## 2012-11-27 DIAGNOSIS — K5732 Diverticulitis of large intestine without perforation or abscess without bleeding: Secondary | ICD-10-CM | POA: Insufficient documentation

## 2012-11-27 DIAGNOSIS — Z933 Colostomy status: Secondary | ICD-10-CM | POA: Insufficient documentation

## 2012-11-27 DIAGNOSIS — R933 Abnormal findings on diagnostic imaging of other parts of digestive tract: Secondary | ICD-10-CM | POA: Insufficient documentation

## 2012-11-30 ENCOUNTER — Encounter (INDEPENDENT_AMBULATORY_CARE_PROVIDER_SITE_OTHER): Payer: Managed Care, Other (non HMO) | Admitting: General Surgery

## 2012-12-01 ENCOUNTER — Encounter (INDEPENDENT_AMBULATORY_CARE_PROVIDER_SITE_OTHER): Payer: Managed Care, Other (non HMO) | Admitting: General Surgery

## 2012-12-01 ENCOUNTER — Ambulatory Visit (INDEPENDENT_AMBULATORY_CARE_PROVIDER_SITE_OTHER): Payer: Managed Care, Other (non HMO) | Admitting: General Surgery

## 2012-12-01 ENCOUNTER — Encounter (INDEPENDENT_AMBULATORY_CARE_PROVIDER_SITE_OTHER): Payer: Self-pay | Admitting: General Surgery

## 2012-12-01 VITALS — BP 100/56 | HR 68 | Temp 97.2°F | Resp 16 | Ht 71.0 in | Wt 154.0 lb

## 2012-12-01 DIAGNOSIS — Z9049 Acquired absence of other specified parts of digestive tract: Secondary | ICD-10-CM

## 2012-12-01 DIAGNOSIS — Z9889 Other specified postprocedural states: Secondary | ICD-10-CM

## 2012-12-01 NOTE — Progress Notes (Signed)
Patient ID: Johnathan Bauer, male   DOB: [birth year], 62 y.o.   MRN: [id number]  Chief Complaint  Patient presents with  . Follow-up    recl colostomy    HPI Johnathan Bauer is a 62 y.o. male.  The patient is a 62 year old male well-known to be secondary to sigmoid resection in 09/02/2012. Patient was seen in the ER was found to have purulent diverticulitis. Patient with sigmoid resection with Luz Brazen procedure and colostomy. The patient subsequently this has undergone colonoscopy with polyps are removed, except for a polyp 2 cm from the skin edge. Patient also had a barium enema which revealed the length of his rectal pouch. The patient has cut down to smoke approximately 10 cigarettes the last 3 months. The patient also states he is increasing his exercise and walking. He is also an approximately 5 pounds in the last 3 months.  HPI  Past Medical History  Diagnosis Date  . A-fib     chronic afib - did not like coumadin  . Coronary artery disease     s/p PCI Sept 2013 with DES OM  . Diverticulitis with perforation     Past Surgical History  Procedure Laterality Date  . Cardiac catheterization    . Laparotomy  09/02/2012    Procedure: EXPLORATORY LAPAROTOMY;  Surgeon: Nada Libman, MD;  Location: [facility] OR;  Service: Vascular;  Laterality: N/A;  Exploratory Laparotomy with Sigmoid resection with end colocstomy.  . Colostomy revision  09/02/2012    Procedure: COLON RESECTION SIGMOID;  Surgeon: Nada Libman, MD;  Location: [facility] OR;  Service: Vascular;  Laterality: N/A;  Sigmoid resection with End Colostomy.  . Colon surgery      Family History  Problem Relation Age of Onset  . Lymphoma Father   . Prostate cancer Paternal Grandfather     Social History History  Substance Use Topics  . Smoking status: Former Smoker -- 0.25 packs/day for 40 years    Types: Cigarettes    Quit date: 09/02/2012  . Smokeless tobacco: Never Used  . Alcohol Use: No     Comment: Daily   2 to  5 beers..quit  09/02/12    Allergies  Allergen Reactions  . Poison Ivy Extract (Extract Of [employer])     Current Outpatient Prescriptions  Medication Sig Dispense Refill  . aspirin EC 325 MG tablet Take 325 mg by mouth daily.      . digoxin (LANOXIN) 0.25 MG tablet Take 0.25 mg by mouth daily.      . metoprolol succinate (TOPROL-XL) 50 MG 24 hr tablet Take 50 mg by mouth 2 (two) times daily. Take with or immediately following a meal.      . nitroGLYCERIN (NITROSTAT) 0.4 MG SL tablet Place 0.4 mg under the tongue every 5 (five) minutes as needed. x3 doses For chest pain      . prasugrel (EFFIENT) 10 MG TABS Take 10 mg by mouth daily.       No current facility-administered medications for this visit.    Review of Systems Review of Systems  HENT: Negative.   Eyes: Negative.   Respiratory: Negative.   Cardiovascular: Negative.   Gastrointestinal: Negative.   Endocrine: Negative.   Neurological: Negative.     Blood pressure 100/56, pulse 68, temperature 97.2 F (36.2 C), temperature source Temporal, resp. rate 16, height 5\' 11"  (1.803 m), weight 154 lb (69.854 kg).  Physical Exam Physical Exam  Constitutional: He is oriented to person, place, and time. He appears  well-developed and well-nourished.  HENT:  Head: Normocephalic and atraumatic.  Eyes: Conjunctivae and EOM are normal. Pupils are equal, round, and reactive to light.  Neck: Normal range of motion.  Cardiovascular: Normal rate, regular rhythm and normal heart sounds.   Pulmonary/Chest: Effort normal and breath sounds normal.  Abdominal: Soft. Bowel sounds are normal. He exhibits no distension and no mass. There is no tenderness. There is no rebound and no guarding.    Musculoskeletal: Normal range of motion.  Neurological: He is alert and oriented to person, place, and time.    Data Reviewed Colonoscopy and barium enema reviewed.  Assessment    The patient is a 62 year old male status post Hartman's procedure end sigmoid  resection.     Plan    1. Will proceed to the operating room for a laparoscopic lysis of adhesions, ostomy takedown and reanastomosis.  2. I discussed all this and benefits of the operation, to include infection, bleeding, dehiscence of the wound, leak, abscess formation, incontinence, possible ileostomy, and other possible complications. The patient and his wife voiced understanding and wished to proceed with ostomy takedown.        Johnathan Bauer., Johnathan Bauer 12/01/2012, 2:11 PM

## 2012-12-07 ENCOUNTER — Telehealth (INDEPENDENT_AMBULATORY_CARE_PROVIDER_SITE_OTHER): Payer: Self-pay | Admitting: *Deleted

## 2012-12-07 NOTE — Telephone Encounter (Signed)
Patient called to request a return to work note for 12/12/12.  Patient did not having fax number.  Patient's contact is Debbe Mounts [phone].

## 2012-12-07 NOTE — Telephone Encounter (Signed)
Derrell Lolling MD filled out ADA paperwork and it was faxed to Nicholes Rough @ [phone] telephone number [phone].

## 2012-12-11 ENCOUNTER — Telehealth (INDEPENDENT_AMBULATORY_CARE_PROVIDER_SITE_OTHER): Payer: Self-pay | Admitting: General Surgery

## 2012-12-11 NOTE — Telephone Encounter (Signed)
Called to follow up on the message that DN sent.Marland KitchenMarland KitchenSome how this patient ended up on my office voice mail. He wants to cancel surgery scheduled 12/25/2012. Please call and confirm/find out what is going on. He is Dr. Jacinto Halim patient. Thanks, Onalee Hua The home # that we have for the patient was full and wouldn't let you leave a message...called at 1:46 12/11/12 and I did LM on wife's [phone] to call me back whenever she got the chance.Marland KitchenLMOM at 1:49 12/11/12

## 2012-12-12 NOTE — Telephone Encounter (Signed)
[facility] 4/8 @ 1:12 asking patient to return my call

## 2012-12-12 NOTE — Telephone Encounter (Signed)
Patient returned my call and stated that they indeed wanted to cancel surgery because patient's employer told him that he either had to be at work or he is fired...told wife that I would inform AR tomorrow and give her a call back...she said ok and thanked me

## 2012-12-13 ENCOUNTER — Encounter (HOSPITAL_COMMUNITY): Payer: Self-pay | Admitting: Pharmacy Technician

## 2012-12-13 ENCOUNTER — Telehealth (INDEPENDENT_AMBULATORY_CARE_PROVIDER_SITE_OTHER): Payer: Self-pay

## 2012-12-13 NOTE — Telephone Encounter (Signed)
Returned wife's call @ 2:40.Marland KitchenMarland KitchenLM to call Lawson Fiscal back

## 2012-12-13 NOTE — Telephone Encounter (Signed)
Patients wife called and wanted to talk to [place]. I asked if I could help her with something since Lawson Fiscal was with patients. She said Lawson Fiscal was suppose to call her some time today and she would just wait for her call. I told her I will send message to Lawson Fiscal and have her call today.

## 2012-12-13 NOTE — Telephone Encounter (Signed)
Patient's wife returned my call again regarding surgery being cancelled and everything that was stated in my first note from DN..she stated that there was a misunderstanding with the patient and his job and that they would in fact be having the surgery but they would just have to reschd it....she said that she would be calling Katie tomorrow to r/s it...so it will not be on 4/21 but will be in the near future to that .Marland KitchenMarland KitchenAR is aware and ok with this

## 2012-12-29 MED ORDER — LIDOCAINE-EPINEPHRINE 2 %-1:100000 IJ SOLN
INTRAMUSCULAR | Status: AC
  Filled 2012-12-29: qty 1

## 2012-12-29 MED ORDER — OXYMETAZOLINE HCL 0.05 % NA SOLN
NASAL | Status: AC
  Filled 2012-12-29: qty 15

## 2013-01-10 ENCOUNTER — Ambulatory Visit (INDEPENDENT_AMBULATORY_CARE_PROVIDER_SITE_OTHER): Payer: Managed Care, Other (non HMO) | Admitting: General Surgery

## 2013-01-10 ENCOUNTER — Encounter (INDEPENDENT_AMBULATORY_CARE_PROVIDER_SITE_OTHER): Payer: Self-pay | Admitting: General Surgery

## 2013-01-10 VITALS — BP 124/74 | HR 87 | Temp 97.8°F | Resp 18 | Ht 69.0 in | Wt 167.0 lb

## 2013-01-10 DIAGNOSIS — Z933 Colostomy status: Secondary | ICD-10-CM

## 2013-01-10 DIAGNOSIS — Z9049 Acquired absence of other specified parts of digestive tract: Secondary | ICD-10-CM

## 2013-01-10 NOTE — Progress Notes (Signed)
Patient ID: Johnathan Bauer, male   DOB: 07/04/1951, 62 y.o.   MRN: [id number] Chief Complaint   Patient presents with   .  Follow-up     recl colostomy   HPI  Johnathan Bauer is a 62 y.o. male. The patient is a 62 year old male well-known to be secondary to sigmoid resection in 09/02/2012. Patient was seen in the ER was found to have purulent diverticulitis. Patient with sigmoid resection with Johnathan Bauer procedure and colostomy. The patient subsequently this has undergone colonoscopy with polyps are removed, except for a polyp 2 cm from the skin edge. Patient also had a barium enema which revealed the length of his rectal pouch. The patient has cut down to smoke approximately 10 cigarettes the last 3 months. The patient also states he is increasing his exercise and walking. He is also an approximately 5 pounds in the last 3 months.  HPI  Past Medical History   Diagnosis  Date   .  A-fib      chronic afib - did not like coumadin   .  Coronary artery disease      s/p PCI Sept 2013 with DES OM   .  Diverticulitis with perforation     Past Surgical History   Procedure  Laterality  Date   .  Cardiac catheterization     .  Laparotomy   09/02/2012     Procedure: EXPLORATORY LAPAROTOMY; Surgeon: Nada Libman, MD; Location[facility] OR; Service: Vascular; Laterality: N/A; Exploratory Laparotomy with Sigmoid resection with end colocstomy.   .  Colostomy revision   09/02/2012     Procedure: COLON RESECTION SIGMOID; Surgeon: Nada Libman, MD; Location: [facility] OR; Service: Vascular; Laterality: N/A; Sigmoid resection with End Colostomy.   .  Colon surgery      Family History   Problem  Relation  Age of Onset   .  Lymphoma  Father    .  Prostate cancer  Paternal Grandfather    Social History  History   Substance Use Topics   .  Smoking status:  Former Smoker -- 0.25 packs/day for 40 years     Types:  Cigarettes     Quit date:  09/02/2012   .  Smokeless tobacco:  Never Used   .  Alcohol Use:  No    Comment: Daily 2 to 5 beers..quit 09/02/12    Allergies   Allergen  Reactions   .  Poison Ivy Extract (Extract Of [employer])     Current Outpatient Prescriptions   Medication  Sig  Dispense  Refill   .  aspirin EC 325 MG tablet  Take 325 mg by mouth daily.     .  digoxin (LANOXIN) 0.25 MG tablet  Take 0.25 mg by mouth daily.     .  metoprolol succinate (TOPROL-XL) 50 MG 24 hr tablet  Take 50 mg by mouth 2 (two) times daily. Take with or immediately following a meal.     .  nitroGLYCERIN (NITROSTAT) 0.4 MG SL tablet  Place 0.4 mg under the tongue every 5 (five) minutes as needed. x3 doses  For chest pain     .  prasugrel (EFFIENT) 10 MG TABS  Take 10 mg by mouth daily.      No current facility-administered medications for this visit.   Review of Systems  Review of Systems  HENT: Negative.  Eyes: Negative.  Respiratory: Negative.  Cardiovascular: Negative.  Gastrointestinal: Negative.  Endocrine: Negative.  Neurological: Negative.  Blood pressure  100/56, pulse 68, temperature 97.2 F (36.2 C), temperature source Temporal, resp. rate 16, height 5\' 11"  (1.803 m), weight 154 lb (69.854 kg).  Physical Exam  Physical Exam  Constitutional: He is oriented to person, place, and time. He appears well-developed and well-nourished.  HENT:  Head: Normocephalic and atraumatic.  Eyes: Conjunctivae and EOM are normal. Pupils are equal, round, and reactive to light.  Neck: Normal range of motion.  Cardiovascular: Normal rate, regular rhythm and normal heart sounds.  Pulmonary/Chest: Effort normal and breath sounds normal.  Abdominal: Soft. Bowel sounds are normal. He exhibits no distension and no mass. There is no tenderness. There is no rebound and no guarding.    Musculoskeletal: Normal range of motion.  Neurological: He is alert and oriented to person, place, and time.  Data Reviewed  Colonoscopy and barium enema reviewed.  Assessment  The patient is a 62 year old male status post  Hartman's procedure end sigmoid resection.  Plan  1. We rediscussed the procedure and all questions were answered and we will proceed to the operating room for a laparoscopic lysis of adhesions, ostomy takedown and reanastomosis.  2. I discussed all this and benefits of the operation, to include infection, bleeding, dehiscence of the wound, leak, abscess formation, incontinence, possible ileostomy, and other possible complications. The patient and his wife voiced understanding and wished to proceed with ostomy takedown.

## 2013-01-17 ENCOUNTER — Telehealth (INDEPENDENT_AMBULATORY_CARE_PROVIDER_SITE_OTHER): Payer: Self-pay | Admitting: General Surgery

## 2013-01-17 ENCOUNTER — Encounter (HOSPITAL_COMMUNITY): Payer: Self-pay

## 2013-01-17 ENCOUNTER — Encounter (HOSPITAL_COMMUNITY)
Admission: RE | Admit: 2013-01-17 | Discharge: 2013-01-17 | Disposition: A | Discharge status: Home / Self Care | Payer: Managed Care, Other (non HMO) | Source: Ambulatory Visit | Attending: General Surgery | Admitting: General Surgery

## 2013-01-17 ENCOUNTER — Ambulatory Visit (HOSPITAL_COMMUNITY)
Admission: RE | Admit: 2013-01-17 | Discharge: 2013-01-17 | Disposition: A | Discharge status: Home / Self Care | Payer: Managed Care, Other (non HMO) | Source: Ambulatory Visit | Attending: General Surgery | Admitting: General Surgery

## 2013-01-17 DIAGNOSIS — Z01818 Encounter for other preprocedural examination: Secondary | ICD-10-CM | POA: Insufficient documentation

## 2013-01-17 DIAGNOSIS — Z01812 Encounter for preprocedural laboratory examination: Secondary | ICD-10-CM | POA: Insufficient documentation

## 2013-01-17 DIAGNOSIS — Z933 Colostomy status: Secondary | ICD-10-CM | POA: Insufficient documentation

## 2013-01-17 DIAGNOSIS — F17200 Nicotine dependence, unspecified, uncomplicated: Secondary | ICD-10-CM | POA: Insufficient documentation

## 2013-01-17 HISTORY — DX: Pneumonia, unspecified organism: J18.9

## 2013-01-17 HISTORY — DX: Chronic kidney disease, unspecified: N18.9

## 2013-01-17 LAB — BASIC METABOLIC PANEL
CO2: 25 mEq/L (ref 19–32)
Calcium: 9.3 mg/dL (ref 8.4–10.5)
Chloride: 104 mEq/L (ref 96–112)
Creatinine, Ser: 1.23 mg/dL (ref 0.50–1.35)
GFR calc Af Amer: 71 mL/min — ABNORMAL LOW (ref >90)
Sodium: 138 mEq/L (ref 135–145)

## 2013-01-17 LAB — CBC
HCT: 49.7 % (ref 39.0–52.0)
MCV: 93.2 fL (ref 78.0–100.0)
Platelets: 185 10*3/uL (ref 150–400)
RBC: 5.33 MIL/uL (ref 4.22–5.81)
RDW: 13.6 % (ref 11.5–15.5)
WBC: 6.7 10*3/uL (ref 4.0–10.5)

## 2013-01-17 LAB — SURGICAL PCR SCREEN: Staphylococcus aureus: POSITIVE — AB

## 2013-01-17 NOTE — Telephone Encounter (Signed)
Spoke with Johnathan Bauer who stated that they would fax over cardiac clearance to my attn on 01/17/13. [phone] and I will fax it over to [place] at [phone] [facility] short stay

## 2013-01-17 NOTE — Progress Notes (Signed)
Talked with Anesthesia PAC Michael  About patient Effient and need of cardiac clearance. PAC talked with patient and wife.[facility] made aware and they will get cardiac clearance and orders for Effient.

## 2013-01-17 NOTE — Pre-Procedure Instructions (Signed)
Kamoni Gentles  01/17/2013   Your procedure is scheduled on:  Monday Jan 22 2013  Report to [facility] at 0530 AM.  Call this number if you have problems the morning of surgery: [phone]   Remember:   Do not eat food or drink liquids after midnight.   Take these medicines the morning of surgery with A SIP OF WATER: Digoxin, and Metoprolol   Do not wear jewelry,.  Do not wear lotions, powders, or perfumes. You may wear deodorant.  Do not shave 48 hours prior to surgery. Men may shave face and neck.  Do not bring valuables to the hospital.  Contacts, dentures or bridgework may not be worn into surgery.  Leave suitcase in the car. After surgery it may be brought to your room.  For patients admitted to the hospital, checkout time is 11:00 AM the day of  discharge.   Patients discharged the day of surgery will not be allowed to drive  home.   Special Instructions: Shower using CHG 2 nights before surgery and the night before surgery.  If you shower the day of surgery use CHG.  Use special wash - you have one bottle of CHG for all showers.  You should use approximately 1/3 of the bottle for each shower.   Please read over the following fact sheets that you were given: Pain Booklet, Coughing and Deep Breathing, MRSA Information and Surgical Site Infection Prevention

## 2013-01-18 ENCOUNTER — Encounter (INDEPENDENT_AMBULATORY_CARE_PROVIDER_SITE_OTHER): Payer: Self-pay

## 2013-01-19 ENCOUNTER — Encounter (HOSPITAL_COMMUNITY): Payer: Self-pay | Admitting: Anesthesiology

## 2013-01-19 ENCOUNTER — Telehealth (INDEPENDENT_AMBULATORY_CARE_PROVIDER_SITE_OTHER): Payer: Self-pay | Admitting: General Surgery

## 2013-01-19 NOTE — Telephone Encounter (Signed)
LMOM at 3:10 to let patient know that per his cardiologist he would not be having surgery on 5/19 that he would need a stress test first..Marland KitchenGrafton Folk P.A. Over at [facility] called with this info..I just wanted to call and verify that patient was in fact aware and would not show up on Monday.Marland KitchenMarland KitchenLM stating that he could call me back with any questions or concerns.Marland KitchenMarland KitchenTried to call the cardiologist but their office closed at 12:30 today 01/19/13

## 2013-01-21 MED ORDER — DEXTROSE 5 % IV SOLN
2.0000 g | INTRAVENOUS | Status: DC
  Filled 2013-01-21: qty 2

## 2013-01-22 ENCOUNTER — Telehealth (INDEPENDENT_AMBULATORY_CARE_PROVIDER_SITE_OTHER): Payer: Self-pay | Admitting: General Surgery

## 2013-01-22 ENCOUNTER — Encounter (HOSPITAL_COMMUNITY): Admission: RE | Payer: Self-pay | Source: Ambulatory Visit

## 2013-01-22 ENCOUNTER — Inpatient Hospital Stay (HOSPITAL_COMMUNITY)
Admission: RE | Admit: 2013-01-22 | Payer: Managed Care, Other (non HMO) | Source: Ambulatory Visit | Admitting: General Surgery

## 2013-01-22 SURGERY — LYSIS, ADHESIONS, LAPAROSCOPIC
Anesthesia: General

## 2013-01-22 NOTE — Telephone Encounter (Signed)
Patient's wife callled to tell me that patient was having stress test now as we speak 01/22/13@9 :30 and she would call me this afternoon to touch base

## 2013-01-23 ENCOUNTER — Encounter (INDEPENDENT_AMBULATORY_CARE_PROVIDER_SITE_OTHER): Payer: Self-pay | Admitting: General Surgery

## 2013-01-30 ENCOUNTER — Encounter (INDEPENDENT_AMBULATORY_CARE_PROVIDER_SITE_OTHER): Payer: Self-pay

## 2013-01-30 NOTE — Telephone Encounter (Signed)
Per Misty Stanley from [facility] office she was faxing over cardiac clearance to [phone] to [facility]'s attn at 2:23 01/30/13

## 2013-02-12 ENCOUNTER — Telehealth (INDEPENDENT_AMBULATORY_CARE_PROVIDER_SITE_OTHER): Payer: Self-pay | Admitting: General Surgery

## 2013-02-12 NOTE — Telephone Encounter (Signed)
Spoke with Johnathan Bauer nurse for dr.kwasowski and she stated that Johnathan Bauer's year mark is 05/30/12 and she would send [facility] a note to ask him when he can go off the effient for 5-7 days so that we can schd surgery around that yr mark per AR

## 2013-02-14 ENCOUNTER — Encounter (HOSPITAL_COMMUNITY): Admission: RE | Admit: 2013-02-14 | Payer: Managed Care, Other (non HMO) | Source: Ambulatory Visit

## 2013-02-21 ENCOUNTER — Encounter (HOSPITAL_COMMUNITY): Admission: RE | Payer: Self-pay | Source: Ambulatory Visit

## 2013-02-21 ENCOUNTER — Ambulatory Visit (HOSPITAL_COMMUNITY)
Admission: RE | Admit: 2013-02-21 | Payer: Managed Care, Other (non HMO) | Source: Ambulatory Visit | Admitting: General Surgery

## 2013-02-21 SURGERY — LYSIS, ADHESIONS, LAPAROSCOPIC
Anesthesia: General

## 2013-02-22 NOTE — Telephone Encounter (Signed)
LM for Johnathan Bauer to call me back...never received a response from [facility] 6/19@2 :40

## 2013-02-26 NOTE — Telephone Encounter (Signed)
Chrissy with Dr.Kwasowski's office called today to state that patient could stop effient for 7 days as of 05/30/13 which would be his 1 year mark...that he also has follow up appt with them in August and they can talk about either continuing the effient or going to something else if need be..they will know the next step

## 2013-04-10 ENCOUNTER — Other Ambulatory Visit (INDEPENDENT_AMBULATORY_CARE_PROVIDER_SITE_OTHER): Payer: Self-pay | Admitting: General Surgery

## 2013-04-10 MED ORDER — MAGNESIUM CITRATE PO SOLN: 2.0000 | Freq: Once | ORAL | Status: DC

## 2013-04-19 ENCOUNTER — Telehealth (INDEPENDENT_AMBULATORY_CARE_PROVIDER_SITE_OTHER): Payer: Self-pay | Admitting: General Surgery

## 2013-04-19 NOTE — Telephone Encounter (Signed)
error 

## 2013-04-19 NOTE — Telephone Encounter (Signed)
Late entry: called and spoke with pt Johnathan Bauer@2 :07 to let him know I would be mailing a bowl prep that is on blue paper for the mag citrate instructions 1 day before surgery and if he had any questions he could call me...patient is in agreement with POC at this time and will call us if needed..mailed out prep [id number]@2 :33

## 2013-05-03 ENCOUNTER — Telehealth (INDEPENDENT_AMBULATORY_CARE_PROVIDER_SITE_OTHER): Payer: Self-pay | Admitting: General Surgery

## 2013-05-03 ENCOUNTER — Encounter (INDEPENDENT_AMBULATORY_CARE_PROVIDER_SITE_OTHER): Payer: Self-pay

## 2013-05-03 NOTE — Telephone Encounter (Signed)
Cardiac clearance received for surgery on 06/06/13 patient can D/C Effient after 05/30/13 and hold ASA 7 days prior to surgery and start back after surgery is performed...will get scanned into EPIC and keep original in Tatianna Ibbotson's pink binder

## 2013-05-28 ENCOUNTER — Encounter (HOSPITAL_COMMUNITY): Payer: Self-pay | Admitting: Pharmacy Technician

## 2013-05-29 ENCOUNTER — Encounter (HOSPITAL_COMMUNITY)
Admission: RE | Admit: 2013-05-29 | Discharge: 2013-05-29 | Disposition: A | Discharge status: Home / Self Care | Payer: Managed Care, Other (non HMO) | Source: Ambulatory Visit | Attending: General Surgery | Admitting: General Surgery

## 2013-05-29 ENCOUNTER — Encounter (HOSPITAL_COMMUNITY): Payer: Self-pay

## 2013-05-29 ENCOUNTER — Telehealth (INDEPENDENT_AMBULATORY_CARE_PROVIDER_SITE_OTHER): Payer: Self-pay

## 2013-05-29 DIAGNOSIS — Z01812 Encounter for preprocedural laboratory examination: Secondary | ICD-10-CM | POA: Insufficient documentation

## 2013-05-29 DIAGNOSIS — Z01818 Encounter for other preprocedural examination: Secondary | ICD-10-CM | POA: Insufficient documentation

## 2013-05-29 LAB — CBC
HCT: 47.4 % (ref 39.0–52.0)
Hemoglobin: 16 g/dL (ref 13.0–17.0)
MCH: 33.3 pg (ref 26.0–34.0)
MCHC: 33.8 g/dL (ref 30.0–36.0)
MCV: 98.8 fL (ref 78.0–100.0)
Platelets: 185 10*3/uL (ref 150–400)

## 2013-05-29 LAB — BASIC METABOLIC PANEL
BUN: 16 mg/dL (ref 6–23)
CO2: 27 mEq/L (ref 19–32)
Calcium: 9.7 mg/dL (ref 8.4–10.5)
Chloride: 101 mEq/L (ref 96–112)
GFR calc non Af Amer: 60 mL/min — ABNORMAL LOW (ref >90)
Glucose, Bld: 82 mg/dL (ref 70–99)
Potassium: 5.5 mEq/L — ABNORMAL HIGH (ref 3.5–5.1)

## 2013-05-29 LAB — SURGICAL PCR SCREEN: Staphylococcus aureus: POSITIVE — AB

## 2013-05-29 NOTE — Telephone Encounter (Signed)
Per the nurse at Short Stay, the pt is positive for staph and MRSA.  He asked me to call in Mupiricin.  He will notify the pt.  Per Protocol, Bactroban 2 % ointment called in to pharmacy.  Bactroban 2 % ointment 1 application nasally bid for 5 days prior to surgery to [facility] in [place].

## 2013-05-29 NOTE — Progress Notes (Signed)
Requested records from [facility] [place]

## 2013-05-29 NOTE — Progress Notes (Signed)
Anesthesia Chart Review:  Patient is a 62 year old male scheduled for Hartman's takedown and reanastomosis on 06/06/13 by Dr. Derrell Lolling.  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    Cardiologist is Dr. Bing Matter who felt patient was acceptable risk for this procedure from a cardiac standpoint.    Echo on 09/09/12 showed:  - Left ventricle: The cavity size was normal. Wall thickness was increased in a pattern of mild LVH. Systolic function was normal. The estimated ejection fraction was in the range of 60% to 65%. Wall motion was normal; there were no regional wall motion abnormalities. The study is not technically sufficient to allow evaluation of LV diastolic function. - Mitral valve: Moderate prolapse, involving the posterior leaflet. Mild regurgitation. - Left atrium: The atrium was moderately dilated. - Right atrium: The atrium was mildly dilated. - Pulmonary arteries: PA peak pressure: 34mm Hg (S). Impressions: There is moderate prolapse of posterior MV leafet; eccentric MR jet makes severity difficult to quantitate; appears mild but may be underestimated; suggest TEE to further assess.  He had an abnormal stress test on 05/15/12 which lead to a cardiac cath on 05/30/12 ([facility]) that showed 15% proximal and mid LAD, 10% D1, 15% mid RCA, 10% Ramus, 90% ostial OM1 s/p DES X 2, 60% mid CX s/p PCI/PTCA.  EKG on 09/09/12 showed afib @ 105 bpm, low voltage QRS, cannot rule out anterior infarct (age undetermined), non-specific ST/T wave abnormality.  HR was 63 bpm at PAT.  CXR on 01/17/13 showed no  active disease.  Preoperative labs noted. CBC WNL. K 5.5, Cr 1.25.  Medication list includes a potassium supplement.  (I routed BMET results and that patient was on a potassium supplement to Dr. Derrell Lolling.  I've asked that he have his staff contact patient if he would like for him to hold it.)  Plan ISTAT on the day of surgery to re-evaluate for hyperkalemia. If K+ remains < 6.0 and otherwise no acute changes then I would anticipate that he could proceed as planned.  Velna [facility] Stay Center/Anesthesiology Phone [phone] 05/29/2013 6:39 PM

## 2013-05-29 NOTE — Pre-Procedure Instructions (Signed)
Johnathan Bauer  05/29/2013   Your procedure is scheduled on:  October 1  Report to [facility]A" [address] at [employer] AM.  Call this number if you have problems the morning of surgery: [phone]   Remember:   Do not eat food or drink liquids after midnight.   Take these medicines the morning of surgery with A SIP OF WATER: Digoxin, Metoprolol   Do not take Aspirin, Aleve, Naproxen, Advil, Ibuprofen, Vitamin, Herbs, or Supplements starting today  Do not wear jewelry, make-up or nail polish.  Do not wear lotions, powders, or perfumes. You may wear deodorant.  Do not shave 48 hours prior to surgery. Men may shave face and neck.  Do not bring valuables to the hospital.  [facility] is not responsible                   for any belongings or valuables.  Contacts, dentures or bridgework may not be worn into surgery.  Leave suitcase in the car. After surgery it may be brought to your room.  For patients admitted to the hospital, checkout time is 11:00 AM the day of  discharge.   Special Instructions: Shower using CHG 2 nights before surgery and the night before surgery.  If you shower the day of surgery use CHG.  Use special wash - you have one bottle of CHG for all showers.  You should use approximately 1/3 of the bottle for each shower.   Please read over the following fact sheets that you were given: Pain Booklet, Coughing and Deep Breathing and Surgical Site Infection Prevention

## 2013-05-31 ENCOUNTER — Telehealth (INDEPENDENT_AMBULATORY_CARE_PROVIDER_SITE_OTHER): Payer: Self-pay | Admitting: General Surgery

## 2013-05-31 NOTE — Telephone Encounter (Signed)
LMOM @11 :40 checking to make sure patient has stopped his effient as of today and his ASA 7 days prior to his surgery on 06/06/13...ask patient to call me back today

## 2013-05-31 NOTE — Telephone Encounter (Signed)
The pt called back and agrees that he has stopped his Effient and Aspirin 7 days preop.

## 2013-06-01 ENCOUNTER — Telehealth (INDEPENDENT_AMBULATORY_CARE_PROVIDER_SITE_OTHER): Payer: Self-pay | Admitting: General Surgery

## 2013-06-01 NOTE — Telephone Encounter (Signed)
Called patient to make sure he had stopped all blood thinners which he stated that he had and then asked about his lab work...his K was a slight bit elevated and per [facility] who looked them over he would just need to stop taking that and pt is aware..pt verbalized agreement with POC at this time

## 2013-06-05 MED ORDER — ALVIMOPAN 12 MG PO CAPS
12.0000 mg | ORAL_CAPSULE | Freq: Once | ORAL | Status: AC
  Administered 2013-06-06: 12 mg via ORAL
  Filled 2013-06-05 (×2): qty 1

## 2013-06-05 MED ORDER — CEFOTETAN DISODIUM 2 G IJ SOLR
2.0000 g | INTRAMUSCULAR | Status: AC
  Administered 2013-06-06: 2 g via INTRAVENOUS
  Filled 2013-06-05: qty 2

## 2013-06-06 ENCOUNTER — Encounter (HOSPITAL_COMMUNITY): Payer: Self-pay | Admitting: *Deleted

## 2013-06-06 ENCOUNTER — Encounter (HOSPITAL_COMMUNITY)
Admission: RE | Disposition: A | Discharge status: Home / Self Care | Payer: Self-pay | Source: Ambulatory Visit | Attending: General Surgery

## 2013-06-06 ENCOUNTER — Inpatient Hospital Stay (HOSPITAL_COMMUNITY): Payer: Managed Care, Other (non HMO) | Admitting: Anesthesiology

## 2013-06-06 ENCOUNTER — Encounter (HOSPITAL_COMMUNITY): Payer: Self-pay | Admitting: Vascular Surgery

## 2013-06-06 ENCOUNTER — Inpatient Hospital Stay (HOSPITAL_COMMUNITY)
Admission: RE | Admit: 2013-06-06 | Discharge: 2013-06-11 | DRG: 337 | Disposition: A | Discharge status: Home / Self Care | Payer: Managed Care, Other (non HMO) | Source: Ambulatory Visit | Attending: General Surgery | Admitting: General Surgery

## 2013-06-06 DIAGNOSIS — R339 Retention of urine, unspecified: Secondary | ICD-10-CM | POA: Diagnosis not present

## 2013-06-06 DIAGNOSIS — I2510 Atherosclerotic heart disease of native coronary artery without angina pectoris: Secondary | ICD-10-CM | POA: Diagnosis present

## 2013-06-06 DIAGNOSIS — Z433 Encounter for attention to colostomy: Principal | ICD-10-CM

## 2013-06-06 DIAGNOSIS — K660 Peritoneal adhesions (postprocedural) (postinfection): Secondary | ICD-10-CM | POA: Diagnosis present

## 2013-06-06 DIAGNOSIS — Z79899 Other long term (current) drug therapy: Secondary | ICD-10-CM

## 2013-06-06 DIAGNOSIS — Z9889 Other specified postprocedural states: Secondary | ICD-10-CM

## 2013-06-06 DIAGNOSIS — I4891 Unspecified atrial fibrillation: Secondary | ICD-10-CM | POA: Diagnosis present

## 2013-06-06 DIAGNOSIS — F17200 Nicotine dependence, unspecified, uncomplicated: Secondary | ICD-10-CM | POA: Diagnosis present

## 2013-06-06 DIAGNOSIS — Z9861 Coronary angioplasty status: Secondary | ICD-10-CM

## 2013-06-06 DIAGNOSIS — Z7982 Long term (current) use of aspirin: Secondary | ICD-10-CM

## 2013-06-06 HISTORY — PX: COLOSTOMY TAKEDOWN: SHX5258

## 2013-06-06 HISTORY — PX: COLOSTOMY REVERSAL: SHX5782

## 2013-06-06 LAB — CBC
Hemoglobin: 16.7 g/dL (ref 13.0–17.0)
MCH: 34.5 pg — ABNORMAL HIGH (ref 26.0–34.0)
Platelets: 227 10*3/uL (ref 150–400)
RBC: 4.84 MIL/uL (ref 4.22–5.81)
WBC: 15.8 10*3/uL — ABNORMAL HIGH (ref 4.0–10.5)

## 2013-06-06 LAB — CREATININE, SERUM
Creatinine, Ser: 1.34 mg/dL (ref 0.50–1.35)
GFR calc Af Amer: 64 mL/min — ABNORMAL LOW (ref >90)
GFR calc non Af Amer: 55 mL/min — ABNORMAL LOW (ref >90)

## 2013-06-06 LAB — POCT I-STAT 4, (NA,K, GLUC, HGB,HCT)
Glucose, Bld: 93 mg/dL (ref 70–99)
Hemoglobin: 17 g/dL (ref 13.0–17.0)
Potassium: 4.5 mEq/L (ref 3.5–5.1)

## 2013-06-06 SURGERY — CLOSURE, COLOSTOMY, LAPAROSCOPIC
Anesthesia: General | Site: Abdomen | Wound class: Clean Contaminated

## 2013-06-06 MED ORDER — ONDANSETRON HCL 4 MG PO TABS: 4.0000 mg | ORAL_TABLET | Freq: Four times a day (QID) | ORAL | Status: DC | PRN

## 2013-06-06 MED ORDER — HYDROMORPHONE HCL PF 1 MG/ML IJ SOLN
INTRAMUSCULAR | Status: AC
  Filled 2013-06-06: qty 1

## 2013-06-06 MED ORDER — CEFAZOLIN SODIUM-DEXTROSE 2-3 GM-% IV SOLR
INTRAVENOUS | Status: AC
  Filled 2013-06-06: qty 50

## 2013-06-06 MED ORDER — HYDROMORPHONE HCL PF 1 MG/ML IJ SOLN
0.2500 mg | INTRAMUSCULAR | Status: DC | PRN
  Administered 2013-06-06 (×2): 0.5 mg via INTRAVENOUS

## 2013-06-06 MED ORDER — NITROGLYCERIN 0.4 MG SL SUBL: 0.4000 mg | SUBLINGUAL_TABLET | SUBLINGUAL | Status: DC | PRN

## 2013-06-06 MED ORDER — HYDROMORPHONE HCL PF 1 MG/ML IJ SOLN
2.0000 mg | INTRAMUSCULAR | Status: DC | PRN
  Administered 2013-06-06 – 2013-06-07 (×6): 2 mg via INTRAVENOUS
  Filled 2013-06-06 (×6): qty 2

## 2013-06-06 MED ORDER — GLYCOPYRROLATE 0.2 MG/ML IJ SOLN
INTRAMUSCULAR | Status: DC | PRN
  Administered 2013-06-06: 0.4 mg via INTRAVENOUS

## 2013-06-06 MED ORDER — PROPOFOL 10 MG/ML IV BOLUS
INTRAVENOUS | Status: DC | PRN
  Administered 2013-06-06: 150 mg via INTRAVENOUS

## 2013-06-06 MED ORDER — INFLUENZA VAC SPLIT QUAD 0.5 ML IM SUSP
0.5000 mL | INTRAMUSCULAR | Status: AC
  Administered 2013-06-07: 0.5 mL via INTRAMUSCULAR
  Filled 2013-06-06: qty 0.5

## 2013-06-06 MED ORDER — LACTATED RINGERS IV SOLN
INTRAVENOUS | Status: DC | PRN
  Administered 2013-06-06 (×3): via INTRAVENOUS

## 2013-06-06 MED ORDER — METOPROLOL SUCCINATE ER 50 MG PO TB24
50.0000 mg | ORAL_TABLET | Freq: Every day | ORAL | Status: DC
  Administered 2013-06-07 – 2013-06-11 (×4): 50 mg via ORAL
  Filled 2013-06-06 (×5): qty 1

## 2013-06-06 MED ORDER — BUPIVACAINE HCL (PF) 0.25 % IJ SOLN
INTRAMUSCULAR | Status: AC
  Filled 2013-06-06: qty 30

## 2013-06-06 MED ORDER — PROMETHAZINE HCL 25 MG/ML IJ SOLN: 6.2500 mg | INTRAMUSCULAR | Status: DC | PRN

## 2013-06-06 MED ORDER — NEOSTIGMINE METHYLSULFATE 1 MG/ML IJ SOLN
INTRAMUSCULAR | Status: DC | PRN
  Administered 2013-06-06: 3 mg via INTRAVENOUS

## 2013-06-06 MED ORDER — LIDOCAINE HCL (CARDIAC) 20 MG/ML IV SOLN
INTRAVENOUS | Status: DC | PRN
  Administered 2013-06-06: 100 mg via INTRAVENOUS

## 2013-06-06 MED ORDER — ONDANSETRON HCL 4 MG/2ML IJ SOLN
INTRAMUSCULAR | Status: DC | PRN
  Administered 2013-06-06: 4 mg via INTRAVENOUS

## 2013-06-06 MED ORDER — DEXTROSE 5 % IV SOLN
2.0000 g | Freq: Two times a day (BID) | INTRAVENOUS | Status: AC
  Administered 2013-06-06: 2 g via INTRAVENOUS
  Filled 2013-06-06: qty 2

## 2013-06-06 MED ORDER — DIGOXIN 250 MCG PO TABS
0.2500 mg | ORAL_TABLET | Freq: Every day | ORAL | Status: DC
  Administered 2013-06-07 – 2013-06-11 (×5): 0.25 mg via ORAL
  Filled 2013-06-06 (×5): qty 1

## 2013-06-06 MED ORDER — ALVIMOPAN 12 MG PO CAPS
12.0000 mg | ORAL_CAPSULE | Freq: Two times a day (BID) | ORAL | Status: DC
  Administered 2013-06-07 – 2013-06-09 (×4): 12 mg via ORAL
  Filled 2013-06-06 (×6): qty 1

## 2013-06-06 MED ORDER — ROCURONIUM BROMIDE 100 MG/10ML IV SOLN
INTRAVENOUS | Status: DC | PRN
  Administered 2013-06-06 (×2): 50 mg via INTRAVENOUS

## 2013-06-06 MED ORDER — OXYCODONE HCL 5 MG PO TABS: 5.0000 mg | ORAL_TABLET | Freq: Once | ORAL | Status: DC | PRN

## 2013-06-06 MED ORDER — ENOXAPARIN SODIUM 40 MG/0.4ML ~~LOC~~ SOLN
40.0000 mg | SUBCUTANEOUS | Status: DC
  Administered 2013-06-07 – 2013-06-10 (×4): 40 mg via SUBCUTANEOUS
  Filled 2013-06-06 (×6): qty 0.4

## 2013-06-06 MED ORDER — DIPHENHYDRAMINE HCL 50 MG/ML IJ SOLN: 12.5000 mg | Freq: Four times a day (QID) | INTRAMUSCULAR | Status: DC | PRN

## 2013-06-06 MED ORDER — OXYCODONE HCL 5 MG/5ML PO SOLN: 5.0000 mg | Freq: Once | ORAL | Status: DC | PRN

## 2013-06-06 MED ORDER — PHENYLEPHRINE HCL 10 MG/ML IJ SOLN
INTRAMUSCULAR | Status: DC | PRN
  Administered 2013-06-06 (×6): 80 ug via INTRAVENOUS

## 2013-06-06 MED ORDER — MIDAZOLAM HCL 2 MG/2ML IJ SOLN: 1.0000 mg | INTRAMUSCULAR | Status: DC | PRN

## 2013-06-06 MED ORDER — FENTANYL CITRATE 0.05 MG/ML IJ SOLN
INTRAMUSCULAR | Status: DC | PRN
  Administered 2013-06-06: 50 ug via INTRAVENOUS
  Administered 2013-06-06 (×2): 100 ug via INTRAVENOUS
  Administered 2013-06-06: 50 ug via INTRAVENOUS
  Administered 2013-06-06: 100 ug via INTRAVENOUS
  Administered 2013-06-06 (×2): 50 ug via INTRAVENOUS
  Administered 2013-06-06: 100 ug via INTRAVENOUS

## 2013-06-06 MED ORDER — BUPIVACAINE HCL 0.25 % IJ SOLN
INTRAMUSCULAR | Status: DC | PRN
  Administered 2013-06-06: 30 mL

## 2013-06-06 MED ORDER — FENTANYL CITRATE 0.05 MG/ML IJ SOLN: 50.0000 ug | Freq: Once | INTRAMUSCULAR | Status: DC

## 2013-06-06 MED ORDER — DEXTROSE-NACL 5-0.9 % IV SOLN
INTRAVENOUS | Status: DC
  Administered 2013-06-06 – 2013-06-08 (×5): via INTRAVENOUS
  Administered 2013-06-09 (×2): 50 mL/h via INTRAVENOUS

## 2013-06-06 MED ORDER — DIPHENHYDRAMINE HCL 12.5 MG/5ML PO ELIX
12.5000 mg | ORAL_SOLUTION | Freq: Four times a day (QID) | ORAL | Status: DC | PRN
  Administered 2013-06-10: 12.5 mg via ORAL
  Filled 2013-06-06: qty 10

## 2013-06-06 MED ORDER — PANTOPRAZOLE SODIUM 40 MG IV SOLR
40.0000 mg | INTRAVENOUS | Status: DC
  Administered 2013-06-06 – 2013-06-08 (×3): 40 mg via INTRAVENOUS
  Filled 2013-06-06 (×4): qty 40

## 2013-06-06 MED ORDER — 0.9 % SODIUM CHLORIDE (POUR BTL) OPTIME
TOPICAL | Status: DC | PRN
  Administered 2013-06-06 (×3): 1000 mL

## 2013-06-06 MED ORDER — ONDANSETRON HCL 4 MG/2ML IJ SOLN
4.0000 mg | Freq: Four times a day (QID) | INTRAMUSCULAR | Status: DC | PRN
  Administered 2013-06-08: 4 mg via INTRAVENOUS
  Filled 2013-06-06: qty 2

## 2013-06-06 SURGICAL SUPPLY — 84 items
APL SKNCLS STERI-STRIP NONHPOA (GAUZE/BANDAGES/DRESSINGS) ×1
APPLIER CLIP ROT 10 11.4 M/L (STAPLE)
APR CLP MED LRG 11.4X10 (STAPLE)
BENZOIN TINCTURE PRP APPL 2/3 (GAUZE/BANDAGES/DRESSINGS) ×1 IMPLANT
BLADE SURG 10 STRL SS (BLADE) ×2 IMPLANT
BLADE SURG ROTATE 9660 (MISCELLANEOUS) ×1 IMPLANT
CANISTER SUCTION 2500CC (MISCELLANEOUS) ×3 IMPLANT
CLIP APPLIE ROT 10 11.4 M/L (STAPLE) IMPLANT
CLOTH BEACON ORANGE TIMEOUT ST (SAFETY) ×2 IMPLANT
COVER MAYO STAND STRL (DRAPES) ×1 IMPLANT
COVER SURGICAL LIGHT HANDLE (MISCELLANEOUS) ×2 IMPLANT
DECANTER SPIKE VIAL GLASS SM (MISCELLANEOUS) ×1 IMPLANT
DISSECTOR BLUNT TIP ENDO 5MM (MISCELLANEOUS) IMPLANT
DRAPE PROXIMA HALF (DRAPES) ×3 IMPLANT
DRAPE UTILITY 15X26 W/TAPE STR (DRAPE) ×5 IMPLANT
DRAPE WARM FLUID 44X44 (DRAPE) ×2 IMPLANT
DRSG OPSITE POSTOP 4X8 (GAUZE/BANDAGES/DRESSINGS) ×1 IMPLANT
DRSG TEGADERM 2-3/8X2-3/4 SM (GAUZE/BANDAGES/DRESSINGS) ×1 IMPLANT
DRSG TEGADERM 4X4.75 (GAUZE/BANDAGES/DRESSINGS) ×1 IMPLANT
ELECT CAUTERY BLADE 6.4 (BLADE) ×4 IMPLANT
ELECT REM PT RETURN 9FT ADLT (ELECTROSURGICAL) ×2
ELECTRODE REM PT RTRN 9FT ADLT (ELECTROSURGICAL) ×1 IMPLANT
GLOVE BIO SURGEON STRL SZ 6 (GLOVE) ×2 IMPLANT
GLOVE BIO SURGEON STRL SZ 6.5 (GLOVE) ×2 IMPLANT
GLOVE BIO SURGEON STRL SZ7.5 (GLOVE) ×3 IMPLANT
GLOVE BIO SURGEON STRL SZ8 (GLOVE) ×2 IMPLANT
GLOVE BIOGEL PI IND STRL 6.5 (GLOVE) IMPLANT
GLOVE BIOGEL PI IND STRL 7.0 (GLOVE) IMPLANT
GLOVE BIOGEL PI IND STRL 8 (GLOVE) ×1 IMPLANT
GLOVE BIOGEL PI INDICATOR 6.5 (GLOVE) ×6
GLOVE BIOGEL PI INDICATOR 7.0 (GLOVE) ×1
GLOVE BIOGEL PI INDICATOR 8 (GLOVE) ×4
GLOVE SS BIOGEL STRL SZ 6.5 (GLOVE) IMPLANT
GLOVE SUPERSENSE BIOGEL SZ 6.5 (GLOVE) ×3
GOWN PREVENTION PLUS XXLARGE (GOWN DISPOSABLE) ×2 IMPLANT
GOWN STRL NON-REIN LRG LVL3 (GOWN DISPOSABLE) ×8 IMPLANT
GOWN STRL REIN XL XLG (GOWN DISPOSABLE) ×5 IMPLANT
KIT BASIN OR (CUSTOM PROCEDURE TRAY) ×2 IMPLANT
KIT ROOM TURNOVER OR (KITS) ×2 IMPLANT
LEGGING LITHOTOMY PAIR STRL (DRAPES) ×1 IMPLANT
LIGASURE 5MM LAPAROSCOPIC (INSTRUMENTS) IMPLANT
LIGASURE IMPACT 36 18CM CVD LR (INSTRUMENTS) IMPLANT
NDL INSUFFLATION 14GA 120MM (NEEDLE) IMPLANT
NEEDLE INSUFFLATION 14GA 120MM (NEEDLE) ×2 IMPLANT
NS IRRIG 1000ML POUR BTL (IV SOLUTION) ×3 IMPLANT
PAD ARMBOARD 7.5X6 YLW CONV (MISCELLANEOUS) ×4 IMPLANT
PENCIL BUTTON HOLSTER BLD 10FT (ELECTRODE) ×4 IMPLANT
SCISSORS LAP 5X35 DISP (ENDOMECHANICALS) ×1 IMPLANT
SET IRRIG TUBING LAPAROSCOPIC (IRRIGATION / IRRIGATOR) ×1 IMPLANT
SLEEVE ENDOPATH XCEL 5M (ENDOMECHANICALS) ×1 IMPLANT
SPECIMEN JAR LARGE (MISCELLANEOUS) ×1 IMPLANT
SPONGE GAUZE 4X4 12PLY (GAUZE/BANDAGES/DRESSINGS) ×2 IMPLANT
STAPLER CIRC CVD 29MM 37CM (STAPLE) ×1 IMPLANT
STAPLER VISISTAT 35W (STAPLE) ×2 IMPLANT
STRIP CLOSURE SKIN 1/2X4 (GAUZE/BANDAGES/DRESSINGS) ×1 IMPLANT
SUT ETHILON 2 0 FS 18 (SUTURE) ×3 IMPLANT
SUT PDS AB 1 CT  36 (SUTURE) ×2
SUT PDS AB 1 CT 36 (SUTURE) IMPLANT
SUT PROLENE 2 0 CT2 30 (SUTURE) IMPLANT
SUT PROLENE 2 0 KS (SUTURE) IMPLANT
SUT SILK 2 0 (SUTURE) ×4
SUT SILK 2 0 SH CR/8 (SUTURE) ×2 IMPLANT
SUT SILK 2-0 18XBRD TIE 12 (SUTURE) ×1 IMPLANT
SUT SILK 3 0 (SUTURE) ×2
SUT SILK 3 0 SH CR/8 (SUTURE) ×2 IMPLANT
SUT SILK 3-0 18XBRD TIE 12 (SUTURE) ×1 IMPLANT
SYS LAPSCP GELPORT 120MM (MISCELLANEOUS)
SYSTEM LAPSCP GELPORT 120MM (MISCELLANEOUS) IMPLANT
TAPE CLOTH SURG 4X10 WHT LF (GAUZE/BANDAGES/DRESSINGS) ×1 IMPLANT
TAPE CLOTH SURG 6X10 WHT LF (GAUZE/BANDAGES/DRESSINGS) ×1 IMPLANT
TOWEL OR 17X24 6PK STRL BLUE (TOWEL DISPOSABLE) ×2 IMPLANT
TOWEL OR 17X26 10 PK STRL BLUE (TOWEL DISPOSABLE) ×2 IMPLANT
TRAY FOLEY CATH 14FRSI W/METER (CATHETERS) ×1 IMPLANT
TRAY LAPAROSCOPIC (CUSTOM PROCEDURE TRAY) ×2 IMPLANT
TRAY PROCTOSCOPIC FIBER OPTIC (SET/KITS/TRAYS/PACK) IMPLANT
TROCAR XCEL 12X100 BLDLESS (ENDOMECHANICALS) ×1 IMPLANT
TROCAR XCEL BLADELESS 5X75MML (TROCAR) ×2 IMPLANT
TROCAR XCEL BLUNT TIP 100MML (ENDOMECHANICALS) ×1 IMPLANT
TROCAR XCEL NON-BLD 11X100MML (ENDOMECHANICALS) ×2 IMPLANT
TROCAR XCEL NON-BLD 5MMX100MML (ENDOMECHANICALS) ×1 IMPLANT
TUBE CONNECTING 12X1/4 (SUCTIONS) ×3 IMPLANT
TUBING FILTER THERMOFLATOR (ELECTROSURGICAL) ×2 IMPLANT
WATER STERILE IRR 1000ML POUR (IV SOLUTION) IMPLANT
YANKAUER SUCT BULB TIP NO VENT (SUCTIONS) ×3 IMPLANT

## 2013-06-06 NOTE — H&P (Signed)
HPI  Johnathan Bauer is a 62 y.o. male. The patient is a 62 year old male well-known to be secondary to sigmoid resection in 09/02/2012. Patient was seen in the ER was found to have purulent diverticulitis. Patient with sigmoid resection with Luz Brazen procedure and colostomy. The patient subsequently this has undergone colonoscopy with polyps are removed, except for a polyp 2 cm from the skin edge. Patient also had a barium enema which revealed the length of his rectal pouch. The patient has cut down to smoke approximately 10 cigarettes the last 3 months. The patient also states he is increasing his exercise and walking. He is also an approximately 5 pounds in the last 3 months.  HPI  Past Medical History   Diagnosis  Date   .  A-fib      chronic afib - did not like coumadin   .  Coronary artery disease      s/p PCI Sept 2013 with DES OM   .  Diverticulitis with perforation     Past Surgical History   Procedure  Laterality  Date   .  Cardiac catheterization     .  Laparotomy   09/02/2012     Procedure: EXPLORATORY LAPAROTOMY; Surgeon: Nada Libman, MD; Location: [facility] OR; Service: Vascular; Laterality: N/A; Exploratory Laparotomy with Sigmoid resection with end colocstomy.   .  Colostomy revision   09/02/2012     Procedure: COLON RESECTION SIGMOID; Surgeon: Nada Libman, MD; Location: [facility] OR; Service: Vascular; Laterality: N/A; Sigmoid resection with End Colostomy.   .  Colon surgery      Family History   Problem  Relation  Age of Onset   .  Lymphoma  Father    .  Prostate cancer  Paternal Grandfather    Social History  History   Substance Use Topics   .  Smoking status:  Former Smoker -- 0.25 packs/day for 40 years     Types:  Cigarettes     Quit date:  09/02/2012   .  Smokeless tobacco:  Never Used   .  Alcohol Use:  No      Comment: Daily 2 to 5 beers..quit 09/02/12    Allergies   Allergen  Reactions   .  Poison Ivy Extract (Extract Of [employer])     Current Outpatient  Prescriptions   Medication  Sig  Dispense  Refill   .  aspirin EC 325 MG tablet  Take 325 mg by mouth daily.     .  digoxin (LANOXIN) 0.25 MG tablet  Take 0.25 mg by mouth daily.     .  metoprolol succinate (TOPROL-XL) 50 MG 24 hr tablet  Take 50 mg by mouth 2 (two) times daily. Take with or immediately following a meal.     .  nitroGLYCERIN (NITROSTAT) 0.4 MG SL tablet  Place 0.4 mg under the tongue every 5 (five) minutes as needed. x3 doses  For chest pain     .  prasugrel (EFFIENT) 10 MG TABS  Take 10 mg by mouth daily.      No current facility-administered medications for this visit.   Review of Systems  Review of Systems  HENT: Negative.  Eyes: Negative.  Respiratory: Negative.  Cardiovascular: Negative.  Gastrointestinal: Negative.  Endocrine: Negative.  Neurological: Negative.  Blood pressure 100/56, pulse 68, temperature 97.2 F (36.2 C), temperature source Temporal, resp. rate 16, height 5\' 11"  (1.803 m), weight 154 lb (69.854 kg).  Physical Exam  Physical Exam  Constitutional:  He is oriented to person, place, and time. He appears well-developed and well-nourished.  HENT:  Head: Normocephalic and atraumatic.  Eyes: Conjunctivae and EOM are normal. Pupils are equal, round, and reactive to light.  Neck: Normal range of motion.  Cardiovascular: Normal rate, regular rhythm and normal heart sounds.  Pulmonary/Chest: Effort normal and breath sounds normal.  Abdominal: Soft. Bowel sounds are normal. He exhibits no distension and no mass. There is no tenderness. There is no rebound and no guarding.    Musculoskeletal: Normal range of motion.  Neurological: He is alert and oriented to person, place, and time.  Data Reviewed  Colonoscopy and barium enema reviewed.  Assessment  The patient is a 62 year old male status post Hartman's procedure end sigmoid resection.  Plan  1. We rediscussed the procedure and all questions were answered and we will proceed to the operating room  for a laparoscopic lysis of adhesions, ostomy takedown and reanastomosis.  2. I discussed all this and benefits of the operation, to include infection, bleeding, dehiscence of the wound, leak, abscess formation, incontinence, possible ileostomy, and other possible complications. The patient and his wife voiced understanding and wished to proceed with ostomy takedown.

## 2013-06-06 NOTE — Op Note (Signed)
Pre Operative Diagnosis: Hartman's pouch and end colostomy   Post Operative Diagnosis: same and multiple adhesions to anterior abd wall   Procedure: Lap Lysis of adhesions, colostomy take down and primary colorectal anastamosis   Surgeon: Dr. Axel Filler   Assistant: Dr. Donell Beers  Anesthesia: GETA   EBL: 100cc   Complications: none  Counts: reported as correct x 2   Findings: Pt had a 29 EEA stapled anastomosis. The anastomosis was under no tension.  Indications for procedure: Pt is a 62 year old male s/p Hartman's pouch after a perforated diverticulitis. After counseling she decided that she wanted her ostomy reversed.   Details of the procedure:The patient was taken back to the operating room. The patient was placed in supine position with bilateral SCDs in place. After appropriate anitbiotics were confirmed, a time-out was confirmed and all facts were verified. A pneumoperitoneum of 14 mmHg was obtained via Veress needle technique in the right subcostal margin. Subsequently this a 5 mm camera and trocar were then introduced there's no acute intra-abdominal organ injury. There seemed to be a moderate amount of adhesions to the anterior midline. At this time the right lower quadrant 5 mm trocar was in place a direct visualization. At this time proceeded to take down the omental adhesions to the anterior abdominal wall. These were in the midline and down towards the pelvis. Lysis of adhesions took approximately 30 minutes. A 5 mm trochar was placed in the midline under direct visualization. A fourth working trocar was then placed in the left subcostal margin under direct visualization. At this time I was able to take down the peritoneal adhesions from the ostomy.   Once these were taken down we turned our attention to the pelvis. There were a minimal amount of small bowel to small bowel adhesions. The rectum was identified as were the  Prolenes that were used to tag the rectal stump. The  rectal stump was easily dissected away from the surrounding tissue and pelvic wall.  Once this was sufficiently dissected away circumferentially. We proceeded to take down the ostomy from the skin level.  An elliptical incision was made around the colostomy. The peritoneum was entered which assisted in dissecting away the ostomy from the surrounding tissue. Once this was dissected away we incised the midline over the previous incision into the abdomen. Bovie cautery was used to maintain hemostasis. A Balfour retractor was then placed into the abdomen. Were able to identify the rectal stump. At this time we checked to see if there was enough length from the colostomy/descending colon down to the rectal stump. The length was adequate, and there was no undue tension.   At this time sizers were used to size the colon. A size 29 sizer fit without undue tension. At this time Dr. Donell Beers proceeded with a rigid sigmoidoscopy from below.  We again checked for adequate length of the colon, which was adequate and with undue tension. The EEA stapler was advanced. Secondary to the length of the rectal stump we proceeded to deploy the spike of the EEA stapler through the anterior portion of the rectal stump, approximately 3 cm from the staple line.  At this time the anvil was advanced over the spike and the EEA stapler was deployed in the standard fashion. Upon examining the donuts there were 2 complete donuts. We proceeded this time possible test. There seemed to be a negative bubble leak test.   At this time the ostomy site was closed with a #1 Vicryl in  a running standard fashion in 2 layers, the peritoneal layer and the fascial layer. The midline was reapproximated using #1 PDS in a running standard fashion x2. We examined the peritoneal cavity with laparoscopy and there were no omental adhesions or bowel was caught within the suture lines. This time the pneumoperitoneum was evacuated. 2-0 nylons x3 were used and placed  over the skin ostomy site, these were not sutured down. Betadine soaked gauze was used and placed into the wound. The midline wound and all trocar sites were reapproximated using skin staples.   The patient was taken to the recovery room in stable condition.

## 2013-06-06 NOTE — Transfer of Care (Signed)
Immediate Anesthesia Transfer of Care Note  Patient: Johnathan Bauer  Procedure(s) Performed: Procedure(s): LAPAROSCOPIC OSTOMY TAKEDOWN AND ANASTOMOSIS  (N/A)  Patient Location: PACU  Anesthesia Type:General  Level of Consciousness: awake and alert   Airway & Oxygen Therapy: Patient Spontanous Breathing and Patient connected to nasal cannula oxygen  Post-op Assessment: Report given to PACU RN and Post -op Vital signs reviewed and stable  Post vital signs: Reviewed and stable  Complications: No apparent anesthesia complications

## 2013-06-06 NOTE — Preoperative (Signed)
Beta Blockers   Reason not to administer Beta Blockers:Concurrent use of intravenous inotropic medications during the perioperative 

## 2013-06-06 NOTE — Anesthesia Preprocedure Evaluation (Signed)
Anesthesia Evaluation  Patient identified by MRN, date of birth, ID band Patient awake    Reviewed: Allergy & Precautions, H&P , NPO status , Patient's Chart, lab work & pertinent test results  Airway Mallampati: II TM Distance: >3 FB Neck ROM: Full    Dental   Pulmonary shortness of breath, COPDformer smoker,  + rhonchi   + wheezing      Cardiovascular hypertension, + CAD and + Cardiac Stents + dysrhythmias Atrial Fibrillation and Ventricular Tachycardia Rhythm:Irregular Rate:Normal     Neuro/Psych    GI/Hepatic Suspected ischemic bowel   Endo/Other    Renal/GU      Musculoskeletal   Abdominal   Peds  Hematology   Anesthesia Other Findings hoarse  Reproductive/Obstetrics                           Anesthesia Physical Anesthesia Plan  ASA: III  Anesthesia Plan: General   Post-op Pain Management:    Induction: Intravenous  Airway Management Planned: Oral ETT  Additional Equipment:   Intra-op Plan:   Post-operative Plan: Extubation in OR  Informed Consent: I have reviewed the patients History and Physical, chart, labs and discussed the procedure including the risks, benefits and alternatives for the proposed anesthesia with the patient or authorized representative who has indicated his/her understanding and acceptance.     Plan Discussed with: CRNA and Surgeon  Anesthesia Plan Comments:         Anesthesia Quick Evaluation

## 2013-06-07 LAB — BASIC METABOLIC PANEL
BUN: 13 mg/dL (ref 6–23)
Calcium: 8.1 mg/dL — ABNORMAL LOW (ref 8.4–10.5)
GFR calc Af Amer: 70 mL/min — ABNORMAL LOW (ref >90)
GFR calc non Af Amer: 60 mL/min — ABNORMAL LOW (ref >90)
Glucose, Bld: 138 mg/dL — ABNORMAL HIGH (ref 70–99)
Potassium: 4.6 mEq/L (ref 3.5–5.1)
Sodium: 137 mEq/L (ref 135–145)

## 2013-06-07 LAB — CBC
Hemoglobin: 14.2 g/dL (ref 13.0–17.0)
Platelets: 180 10*3/uL (ref 150–400)
RBC: 4.16 MIL/uL — ABNORMAL LOW (ref 4.22–5.81)
WBC: 10 10*3/uL (ref 4.0–10.5)

## 2013-06-07 MED ORDER — HYDROMORPHONE HCL PF 1 MG/ML IJ SOLN
0.5000 mg | INTRAMUSCULAR | Status: DC | PRN
  Administered 2013-06-07: 0.5 mg via INTRAVENOUS
  Administered 2013-06-07: 2 mg via INTRAVENOUS
  Administered 2013-06-08 (×5): 1 mg via INTRAVENOUS
  Administered 2013-06-09: 2 mg via INTRAVENOUS
  Administered 2013-06-09: 1 mg via INTRAVENOUS
  Administered 2013-06-09: 2 mg via INTRAVENOUS
  Administered 2013-06-09: 1 mg via INTRAVENOUS
  Administered 2013-06-10 (×2): 2 mg via INTRAVENOUS
  Filled 2013-06-07: qty 1
  Filled 2013-06-07: qty 2
  Filled 2013-06-07 (×2): qty 1
  Filled 2013-06-07 (×2): qty 2
  Filled 2013-06-07: qty 1
  Filled 2013-06-07: qty 2
  Filled 2013-06-07: qty 1
  Filled 2013-06-07: qty 2
  Filled 2013-06-07: qty 1
  Filled 2013-06-07: qty 2

## 2013-06-07 NOTE — Evaluation (Signed)
Physical Therapy Evaluation Patient Details Name: Johnathan Bauer MRN: [id number] DOB: [birth year] Today's Date: 06/07/2013 Time: 4098-1191 PT Time Calculation (min): 14 min  PT Assessment / Plan / Recommendation History of Present Illness  Pt adm for takedown of colostomy on 06/06/13.  Clinical Impression  Pt amb in hallway with only supervision required.  Should quickly progress to independence.  Can continue to amb in hallway with staff or family until he reaches independence.    PT Assessment  Patent does not need any further PT services    Follow Up Recommendations  No PT follow up    Does the patient have the potential to tolerate intense rehabilitation      Barriers to Discharge        Equipment Recommendations  None recommended by PT    Recommendations for Other Services     Frequency      Precautions / Restrictions Precautions Precautions: None   Pertinent Vitals/Pain Incisional pain with cough.      Mobility  Bed Mobility Bed Mobility: Supine to Sit;Sitting - Scoot to Edge of Bed Supine to Sit: 6: Modified independent (Device/Increase time) Sitting - Scoot to Edge of Bed: 6: Modified independent (Device/Increase time) Details for Bed Mobility Assistance: incr time Transfers Transfers: Sit to Stand;Stand to Sit Sit to Stand: 6: Modified independent (Device/Increase time);With upper extremity assist;From bed Stand to Sit: 6: Modified independent (Device/Increase time);With upper extremity assist;To bed Ambulation/Gait Ambulation/Gait Assistance: 5: Supervision Ambulation Distance (Feet): 250 Feet Assistive device: None Ambulation/Gait Assistance Details: Slightly unsteady in hallway but no loss of balance. Gait Pattern: Step-through pattern;Decreased stride length Gait velocity: decr    Exercises     PT Diagnosis:    PT Problem List:   PT Treatment Interventions:       PT Goals(Current goals can be found in the care plan section) Acute Rehab PT  Goals PT Goal Formulation: No goals set, d/c therapy  Visit Information  Last PT Received On: 06/07/13 Assistance Needed: +1 History of Present Illness: Pt adm for takedown of colostomy on 06/06/13.       Prior Functioning  Home Living Family/patient expects to be discharged to:: Private residence Living Arrangements: Spouse/significant other Available Help at Discharge: Family;Available PRN/intermittently Type of Home: House Home Access: Stairs to enter Entrance Stairs-Number of Steps: 1 Home Layout: One level Home Equipment: None Additional Comments: 5 stairs up to bedroom Prior Function Level of Independence: Independent Communication Communication: No difficulties Dominant Hand: Right    Cognition  Cognition Arousal/Alertness: Awake/alert Behavior During Therapy: WFL for tasks assessed/performed Overall Cognitive Status: Within Functional Limits for tasks assessed    Extremity/Trunk Assessment Upper Extremity Assessment Upper Extremity Assessment: Overall WFL for tasks assessed Lower Extremity Assessment Lower Extremity Assessment: Overall WFL for tasks assessed   Balance Balance Balance Assessed: Yes Static Standing Balance Static Standing - Balance Support: No upper extremity supported;During functional activity Static Standing - Level of Assistance: 7: Independent Dynamic Standing Balance Dynamic Standing - Balance Support: No upper extremity supported Dynamic Standing - Level of Assistance: 5: Stand by assistance  End of Session PT - End of Session Activity Tolerance: Patient tolerated treatment well Patient left: in bed Nurse Communication: Mobility status  GP     Johnathan Bauer 06/07/2013, 4:08 PM  [employer] PT [phone]

## 2013-06-07 NOTE — Anesthesia Postprocedure Evaluation (Signed)
  Anesthesia Post-op Note  Patient: Johnathan Bauer  Procedure(s) Performed: Procedure(s): LAPAROSCOPIC OSTOMY TAKEDOWN AND ANASTOMOSIS  (N/A)  Patient Location: PACU  Anesthesia Type:General  Level of Consciousness: awake  Airway and Oxygen Therapy: Patient Spontanous Breathing  Post-op Pain: mild  Post-op Assessment: Post-op Vital signs reviewed  Post-op Vital Signs: stable  Complications: No apparent anesthesia complications

## 2013-06-07 NOTE — Progress Notes (Signed)
Pt unable to void,blaader very distended,bladder scan showed 318cc,pt requested foley inserted,Md on call notified,new order given.

## 2013-06-07 NOTE — Progress Notes (Signed)
1 Day Post-Op  Subjective: Pt doing well. Stood yesterday.  Feels like pain Rx are making him feel funny and have vivid dreams.  Objective: Vital signs in last 24 hours: Temp:  [97.4 F (36.3 C)-98 F (36.7 C)] 98 F (36.7 C) (10/02 0511) Pulse Rate:  [79-122] 97 (10/02 0511) Resp:  [14-20] 16 (10/02 0511) BP: (109-124)/(70-86) 112/70 mmHg (10/02 0511) SpO2:  [96 %-100 %] 100 % (10/02 0511)    Intake/Output from previous day: 10/01 0701 - 10/02 0700 In: 2200 [I.V.:2200] Out: 520 [Urine:520] Intake/Output this shift:    General appearance: alert and cooperative GI: hypoactive BS, wound dsg, c/d/i  Lab Results:   Recent Labs  06/06/13 1412 06/07/13 0625  WBC 15.8* 10.0  HGB 16.7 14.2  HCT 47.4 41.2  PLT 227 180   BMET  Recent Labs  06/06/13 0706 06/06/13 1412 06/07/13 0625  NA 140  --  137  K 4.5  --  4.6  CL  --   --  103  CO2  --   --  27  GLUCOSE 93  --  138*  BUN  --   --  13  CREATININE  --  1.34 1.25  CALCIUM  --   --  8.1*     Anti-infectives: Anti-infectives   Start     Dose/Rate Route Frequency Ordered Stop   06/06/13 1600  cefoTEtan (CEFOTAN) 2 g in dextrose 5 % 50 mL IVPB     2 g 100 mL/hr over 30 Minutes Intravenous Every 12 hours 06/06/13 1150 06/06/13 1630   06/06/13 0600  cefoTEtan (CEFOTAN) 2 g in dextrose 5 % 50 mL IVPB     2 g 100 mL/hr over 30 Minutes Intravenous On call to O.R. 06/05/13 1425 06/06/13 0839   06/06/13 0543  ceFAZolin (ANCEF) 2-3 GM-% IVPB SOLR  Status:  Discontinued    Comments:  MOORE, KRISTI: cabinet override      06/06/13 0543 06/06/13 0540      Assessment/Plan: s/p Procedure(s): LAPAROSCOPIC OSTOMY TAKEDOWN AND ANASTOMOSIS  (N/A) d/c foley PT to eval and tx with ambulatino Start CLD Await bowel function   LOS: 1 day    Marigene Ehlers., Imya Mance 06/07/2013

## 2013-06-08 ENCOUNTER — Encounter (HOSPITAL_COMMUNITY): Payer: Self-pay | Admitting: General Surgery

## 2013-06-08 MED ORDER — METOCLOPRAMIDE HCL 5 MG/ML IJ SOLN
10.0000 mg | Freq: Four times a day (QID) | INTRAMUSCULAR | Status: DC
  Administered 2013-06-08 – 2013-06-11 (×3): 10 mg via INTRAVENOUS
  Filled 2013-06-08 (×17): qty 2

## 2013-06-08 MED ORDER — TAMSULOSIN HCL 0.4 MG PO CAPS
0.4000 mg | ORAL_CAPSULE | Freq: Every day | ORAL | Status: DC
  Administered 2013-06-08 – 2013-06-11 (×4): 0.4 mg via ORAL
  Filled 2013-06-08 (×4): qty 1

## 2013-06-08 NOTE — Progress Notes (Signed)
Pt's abdomen is distended,very faint bowel sounds and pt vomited greenish liquid emesis approximately 200 cc,zofran 4 mg iv given,will continue to monitor.

## 2013-06-08 NOTE — Progress Notes (Signed)
2 Days Post-Op  Subjective: Pt doing well today.  Ambulating well in the hall.  Had some n/v last night.  Had some urinary retention last night and had catheter replaced.  Objective: Vital signs in last 24 hours: Temp:  [97.6 F (36.4 C)-99.1 F (37.3 C)] 98.5 F (36.9 C) (10/03 0555) Pulse Rate:  [87-104] 91 (10/03 0555) Resp:  [18-20] 18 (10/03 0555) BP: (110-127)/(66-96) 119/96 mmHg (10/03 0555) SpO2:  [95 %-100 %] 97 % (10/03 0555) Weight:  [172 lb (78.019 kg)] 172 lb (78.019 kg) (10/02 2000) Last BM Date: 06/05/13  Intake/Output from previous day: 10/02 0701 - 10/03 0700 In: 1628.3 [I.V.:1628.3] Out: 1000 [Urine:1000] Intake/Output this shift: Total I/O In: -  Out: 150 [Urine:150]  General appearance: alert and cooperative Cardio: regular rate and rhythm, S1, S2 normal, no murmur, click, rub or gallop GI: soft, dist, NTTP, hypoactive BS, wounds c/d/i  Lab Results:   Recent Labs  06/06/13 1412 06/07/13 0625  WBC 15.8* 10.0  HGB 16.7 14.2  HCT 47.4 41.2  PLT 227 180   BMET  Recent Labs  06/06/13 0706 06/06/13 1412 06/07/13 0625  NA 140  --  137  K 4.5  --  4.6  CL  --   --  103  CO2  --   --  27  GLUCOSE 93  --  138*  BUN  --   --  13  CREATININE  --  1.34 1.25  CALCIUM  --   --  8.1*   PT/INR No results found for this basename: LABPROT, INR,  in the last 72 hours ABG No results found for this basename: PHART, PCO2, PO2, HCO3,  in the last 72 hours  Studies/Results: No results found.  Anti-infectives: Anti-infectives   Start     Dose/Rate Route Frequency Ordered Stop   06/06/13 1600  cefoTEtan (CEFOTAN) 2 g in dextrose 5 % 50 mL IVPB     2 g 100 mL/hr over 30 Minutes Intravenous Every 12 hours 06/06/13 1150 06/06/13 1630   06/06/13 0600  cefoTEtan (CEFOTAN) 2 g in dextrose 5 % 50 mL IVPB     2 g 100 mL/hr over 30 Minutes Intravenous On call to O.R. 06/05/13 1425 06/06/13 0839   06/06/13 0543  ceFAZolin (ANCEF) 2-3 GM-% IVPB SOLR  Status:   Discontinued    Comments:  MOORE, KRISTI: cabinet override      06/06/13 0543 06/06/13 0540      Assessment/Plan: s/p Procedure(s): LAPAROSCOPIC OSTOMY TAKEDOWN AND ANASTOMOSIS  (N/A) d/c foley Start Flomax Keep NPO at this time await bowel function Reglan Con't encourage ambulation   LOS: 2 days    Marigene Ehlers., [facility] 06/08/2013

## 2013-06-09 MED ORDER — PANTOPRAZOLE SODIUM 40 MG PO TBEC
40.0000 mg | DELAYED_RELEASE_TABLET | Freq: Every day | ORAL | Status: DC
  Administered 2013-06-10: 40 mg via ORAL
  Filled 2013-06-09: qty 1

## 2013-06-09 NOTE — Progress Notes (Signed)
3 Days Post-Op  Subjective: Bowels moving less bloated voiding ok  Objective: Vital signs in last 24 hours: Temp:  [98.3 F (36.8 C)-98.8 F (37.1 C)] 98.6 F (37 C) (10/04 0451) Pulse Rate:  [83-98] 83 (10/04 0451) Resp:  [16] 16 (10/04 0451) BP: (115-141)/(72-93) 116/93 mmHg (10/04 0451) SpO2:  [98 %] 98 % (10/04 0451) Last BM Date: 06/08/13  Intake/Output from previous day: 10/03 0701 - 10/04 0700 In: 1400 [I.V.:1400] Out: 350 [Urine:350] Intake/Output this shift:    Incision/Wound:packing removed from ostomy site.  Too dirty to close. staples intact and clean.  Soft min distension.  Lab Results:   Recent Labs  06/06/13 1412 06/07/13 0625  WBC 15.8* 10.0  HGB 16.7 14.2  HCT 47.4 41.2  PLT 227 180   BMET  Recent Labs  06/06/13 1412 06/07/13 0625  NA  --  137  K  --  4.6  CL  --  103  CO2  --  27  GLUCOSE  --  138*  BUN  --  13  CREATININE 1.34 1.25  CALCIUM  --  8.1*   PT/INR No results found for this basename: LABPROT, INR,  in the last 72 hours ABG No results found for this basename: PHART, PCO2, PO2, HCO3,  in the last 72 hours  Studies/Results: No results found.  Anti-infectives: Anti-infectives   Start     Dose/Rate Route Frequency Ordered Stop   06/06/13 1600  cefoTEtan (CEFOTAN) 2 g in dextrose 5 % 50 mL IVPB     2 g 100 mL/hr over 30 Minutes Intravenous Every 12 hours 06/06/13 1150 06/06/13 1630   06/06/13 0600  cefoTEtan (CEFOTAN) 2 g in dextrose 5 % 50 mL IVPB     2 g 100 mL/hr over 30 Minutes Intravenous On call to O.R. 06/05/13 1425 06/06/13 0839   06/06/13 0543  ceFAZolin (ANCEF) 2-3 GM-% IVPB SOLR  Status:  Discontinued    Comments:  MOORE, KRISTI: cabinet override      06/06/13 0543 06/06/13 0540      Assessment/Plan: s/p Procedure(s): LAPAROSCOPIC OSTOMY TAKEDOWN AND ANASTOMOSIS  (N/A) Advance diet Ambulate in halls  Wet to dry daily to ostomy site  LOS: 3 days    Johnathan Bauer A. 06/09/2013

## 2013-06-10 MED ORDER — OXYCODONE-ACETAMINOPHEN 5-325 MG PO TABS
2.0000 | ORAL_TABLET | ORAL | Status: DC | PRN
  Administered 2013-06-10 – 2013-06-11 (×6): 2 via ORAL
  Filled 2013-06-10 (×6): qty 2

## 2013-06-10 NOTE — Progress Notes (Signed)
4 Days Post-Op  Subjective: Bowels moving.  Tolerating full liquids without vomiting.  Objective: Vital signs in last 24 hours: Temp:  [97.7 F (36.5 C)-99.2 F (37.3 C)] 98.2 F (36.8 C) (10/05 0551) Pulse Rate:  [80-99] 80 (10/05 0551) Resp:  [18] 18 (10/05 0551) BP: (112-158)/(69-102) 137/92 mmHg (10/05 0551) SpO2:  [98 %] 98 % (10/05 0551) Last BM Date: 06/09/13  Intake/Output from previous day: 10/04 0701 - 10/05 0700 In: 880 [P.O.:480; I.V.:400] Out: -  Intake/Output this shift:    Incision/Wound:dressing in place.  Mild drainage.  Ostomy site with some fibrinous exudate.  Being packed. Soft min distension.    Lab Results:  No results found for this basename: WBC, HGB, HCT, PLT,  in the last 72 hours BMET No results found for this basename: NA, K, CL, CO2, GLUCOSE, BUN, CREATININE, CALCIUM,  in the last 72 hours PT/INR No results found for this basename: LABPROT, INR,  in the last 72 hours ABG No results found for this basename: PHART, PCO2, PO2, HCO3,  in the last 72 hours  Studies/Results: No results found.  Anti-infectives: Anti-infectives   Start     Dose/Rate Route Frequency Ordered Stop   06/06/13 1600  cefoTEtan (CEFOTAN) 2 g in dextrose 5 % 50 mL IVPB     2 g 100 mL/hr over 30 Minutes Intravenous Every 12 hours 06/06/13 1150 06/06/13 1630   06/06/13 0600  cefoTEtan (CEFOTAN) 2 g in dextrose 5 % 50 mL IVPB     2 g 100 mL/hr over 30 Minutes Intravenous On call to O.R. 06/05/13 1425 06/06/13 0839   06/06/13 0543  ceFAZolin (ANCEF) 2-3 GM-% IVPB SOLR  Status:  Discontinued    Comments:  MOORE, KRISTI: cabinet override      06/06/13 0543 06/06/13 0540      Assessment/Plan: s/p Procedure(s): LAPAROSCOPIC OSTOMY TAKEDOWN AND ANASTOMOSIS  (N/A) Advance diet Plan for discharge tomorrow  LOS: 4 days    Johnathan Muldrew A. 06/10/2013

## 2013-06-11 MED ORDER — OXYCODONE-ACETAMINOPHEN 10-325 MG PO TABS: 1.0000 | ORAL_TABLET | ORAL | Status: DC | PRN

## 2013-06-11 NOTE — Progress Notes (Signed)
Pt discharged to home

## 2013-06-11 NOTE — Discharge Summary (Signed)
Physician Discharge Summary  Patient ID: Johnathan Bauer MRN: [id number] DOB/AGE: 10/01/1950 62 y.o.  Admit date: 06/06/2013 Discharge date: 06/11/2013  Admission Diagnoses: Hartman's pouch and colostomy  Discharge Diagnoses: Hartman's takedown and colostomy reversal Active Problems:   * No active hospital problems. *   Discharged Condition: good  Hospital Course: Post op pt was send to floor.  Pt was started on a CLD POD1.  His bowels were slow to return to function and was made NPO.  Soon there after he began having bowel function and started on a  CLD and adv as tol.  He was tol a regular diet on DC.  He was o/w ambulating on his own and had good pain control.  He was afebrile and deemed stable for DC and Dc'd home.  Consults: None  Significant Diagnostic Studies: none  Treatments: surgery: 06/06/2013  Discharge Exam: Blood pressure 124/93, pulse 68, temperature 98 F (36.7 C), temperature source Oral, resp. rate 18, height 5\' 11"  (1.803 m), weight 172 lb (78.019 kg), SpO2 100.00%. General appearance: alert and cooperative Cardio: regular rate and rhythm, S1, S2 normal, no murmur, click, rub or gallop GI: soft, non-tender; bowel sounds normal; no masses,  no organomegaly Incision/Wound: c/d/i, no erythema or drainage   Disposition: 03-Skilled Nursing Facility  Discharge Orders   Future Appointments Provider Department Dept Phone   06/15/2013 4:00 PM Axel Filler, MD [facility], [place] [phone]   Future Orders Complete By Expires   Discharge patient  As directed        Medication List         aspirin 325 MG tablet  Take 325 mg by mouth daily.     CALCIUM CITRATE PLUS/MAGNESIUM PO  Take 0.5 tablets by mouth daily.     digoxin 0.25 MG tablet  Commonly known as:  LANOXIN  Take 0.25 mg by mouth daily.     metoprolol succinate 50 MG 24 hr tablet  Commonly known as:  TOPROL-XL  Take 50 mg by mouth daily.     mupirocin ointment 2 %  Commonly known  as:  BACTROBAN  Place 1 application into the nose 2 (two) times daily. Apply nasally bid for five days prior to surgery     nitroGLYCERIN 0.4 MG SL tablet  Commonly known as:  NITROSTAT  - Place 0.4 mg under the tongue every 5 (five) minutes as needed. x3 doses  - For chest pain     oxyCODONE-acetaminophen 10-325 MG per tablet  Commonly known as:  PERCOCET  Take 1 tablet by mouth every 4 (four) hours as needed for pain.     Potassium 99 MG Tabs  Take 99 mg by mouth daily.           Follow-up Information   Follow up with Lajean Saver, MD. Schedule an appointment as soon as possible for a visit in 2 weeks.   Specialty:  General Surgery   Contact information:   [address] [place] [address] [phone]       Signed: Marigene Ehlers., Jed Limerick 06/11/2013, 9:05 AM

## 2013-06-12 ENCOUNTER — Telehealth (INDEPENDENT_AMBULATORY_CARE_PROVIDER_SITE_OTHER): Payer: Self-pay | Admitting: General Surgery

## 2013-06-12 NOTE — Telephone Encounter (Signed)
LMOM asking pt to return my call re:his follow up appts.

## 2013-06-12 NOTE — Telephone Encounter (Signed)
Patient had colostomy takedown on 06/06/13 but released on 06/10/13 from hospital wants to know if need to keep the appoint for 06/15/13 or coming back in 2 weeks 06/25/13, please call him. Thank you

## 2013-06-13 NOTE — Telephone Encounter (Signed)
Patient returned my call about follow up appt on 10/10 @4  for nurse only staple removal and then follow up with AR 10/20 and to arrive @4 :30..patient verbalized agreement for both appts and stated that he would call back if he could not make the nurse only appt on 10/10.Marland KitchenMarland Kitchenpt did ask if he could drive and I told him that as long as he is not taking any narcotics and he felt comfortable then he is fine to drive..pt is aware of POC at this time

## 2013-06-15 ENCOUNTER — Ambulatory Visit (INDEPENDENT_AMBULATORY_CARE_PROVIDER_SITE_OTHER): Payer: Commercial Indemnity

## 2013-06-15 ENCOUNTER — Encounter (INDEPENDENT_AMBULATORY_CARE_PROVIDER_SITE_OTHER): Payer: Commercial Indemnity | Admitting: General Surgery

## 2013-06-15 ENCOUNTER — Encounter (INDEPENDENT_AMBULATORY_CARE_PROVIDER_SITE_OTHER): Payer: Self-pay

## 2013-06-15 VITALS — BP 128/74 | HR 68 | Temp 98.8°F | Resp 16 | Ht 71.0 in | Wt 173.2 lb

## 2013-06-15 DIAGNOSIS — Z4802 Encounter for removal of sutures: Secondary | ICD-10-CM

## 2013-06-15 NOTE — Progress Notes (Signed)
Patient comes in today s/p a Laparoscopic Ostomy Takedown and Anastomosis on 06/06/2013. Patient laid prone on the table and I removed the dressings from the wound. There was no odor, the wound looked pink and healthy, and I saw no drainage. I removed the staples from the laparoscopic sites, I did not feel the other staples or the nylon stitches were ready, as those wounds were not completely healed. I went and retrieved Dr. Derrell Lolling, he agreed and told me to leave them in for now. The patient is scheduled for a follow up appointment on 06/25/2013. I applied an abs with paper tape to the incision site and told patient to call if he noticed any sever pain, drainage, swelling, redness or odor. Patient voiced his understanding. REA, CMA

## 2013-06-25 ENCOUNTER — Encounter (INDEPENDENT_AMBULATORY_CARE_PROVIDER_SITE_OTHER): Payer: Self-pay | Admitting: General Surgery

## 2013-06-25 ENCOUNTER — Ambulatory Visit (INDEPENDENT_AMBULATORY_CARE_PROVIDER_SITE_OTHER): Payer: Commercial Indemnity | Admitting: General Surgery

## 2013-06-25 VITALS — BP 128/74 | HR 68 | Temp 97.3°F | Resp 16 | Ht 70.6 in | Wt 164.4 lb

## 2013-06-25 DIAGNOSIS — Z9889 Other specified postprocedural states: Secondary | ICD-10-CM

## 2013-06-25 NOTE — Progress Notes (Signed)
Patient ID: Johnathan Bauer, male   DOB: January 02, 1951, 62 y.o.   MRN: [id number] The patient is a 62 year old male status post Hartmann's reversal and ostomy takedown. Patient was seen recently for a nurse only visit and staple removal.  Patient has had some loose bowel movements approximately 3 times a day. The patient elastics no fevers or chills while at home.  On exam: Wounds are clean dry and intact. Staples in place. Ostomy site stitches in place with no erythema.  Assessment and plan: 62 year old male status post Hartman's reversal of the ostomy takedown. 1. Will remove all staples and stitches today. 2. I recommended Metamucil to help bulk up the stools. Should this not work after a week to 2 weeks of consist of Metamucil treatment will try Imodium. 3. We'll have patient follow back up in 3-4 weeks for wound check

## 2013-07-18 ENCOUNTER — Encounter (INDEPENDENT_AMBULATORY_CARE_PROVIDER_SITE_OTHER): Payer: Self-pay | Admitting: General Surgery

## 2013-07-18 ENCOUNTER — Ambulatory Visit (INDEPENDENT_AMBULATORY_CARE_PROVIDER_SITE_OTHER): Payer: Commercial Indemnity | Admitting: General Surgery

## 2013-07-18 VITALS — BP 132/82 | HR 70 | Temp 97.3°F | Resp 16 | Wt 163.0 lb

## 2013-07-18 DIAGNOSIS — Z9889 Other specified postprocedural states: Secondary | ICD-10-CM

## 2013-07-18 NOTE — Progress Notes (Signed)
Patient ID: Johnathan Bauer, male   DOB: 1951-07-19, 62 y.o.   MRN: [id number] Post op course Has been doing well since his last clinic visit.his midline wound is well-healed at this time. He's had no further complications. He's eating a normal diet.  His bowel function normal with only loose bowel movements every now and then.  On Exam: His midline wound is healed as is his ostomy site  Assessment and Plan 62 year old male status post Hartmann's take down 1.  The patient can continue with normal activities and lifting normal weight. 2. Patient follow up as needed.   Axel Filler, MD [facility] General & Minimally Invasive Surgery Trauma & Emergency Surgery

## 2014-11-27 ENCOUNTER — Other Ambulatory Visit (INDEPENDENT_AMBULATORY_CARE_PROVIDER_SITE_OTHER): Payer: Self-pay

## 2014-11-27 DIAGNOSIS — Z01818 Encounter for other preprocedural examination: Secondary | ICD-10-CM

## 2014-12-21 ENCOUNTER — Ambulatory Visit: Payer: Self-pay | Admitting: General Surgery

## 2014-12-25 ENCOUNTER — Encounter (HOSPITAL_COMMUNITY): Payer: Self-pay

## 2014-12-25 ENCOUNTER — Encounter (HOSPITAL_COMMUNITY)
Admission: RE | Admit: 2014-12-25 | Discharge: 2014-12-25 | Disposition: A | Discharge status: Home / Self Care | Payer: BLUE CROSS/BLUE SHIELD | Source: Ambulatory Visit | Attending: General Surgery | Admitting: General Surgery

## 2014-12-25 DIAGNOSIS — R9431 Abnormal electrocardiogram [ECG] [EKG]: Secondary | ICD-10-CM | POA: Diagnosis not present

## 2014-12-25 DIAGNOSIS — Z79899 Other long term (current) drug therapy: Secondary | ICD-10-CM | POA: Diagnosis not present

## 2014-12-25 DIAGNOSIS — I2510 Atherosclerotic heart disease of native coronary artery without angina pectoris: Secondary | ICD-10-CM | POA: Insufficient documentation

## 2014-12-25 DIAGNOSIS — I4891 Unspecified atrial fibrillation: Secondary | ICD-10-CM | POA: Insufficient documentation

## 2014-12-25 DIAGNOSIS — Z7982 Long term (current) use of aspirin: Secondary | ICD-10-CM | POA: Insufficient documentation

## 2014-12-25 DIAGNOSIS — F17200 Nicotine dependence, unspecified, uncomplicated: Secondary | ICD-10-CM | POA: Insufficient documentation

## 2014-12-25 DIAGNOSIS — Z01818 Encounter for other preprocedural examination: Secondary | ICD-10-CM | POA: Insufficient documentation

## 2014-12-25 DIAGNOSIS — K432 Incisional hernia without obstruction or gangrene: Secondary | ICD-10-CM | POA: Insufficient documentation

## 2014-12-25 DIAGNOSIS — Z01812 Encounter for preprocedural laboratory examination: Secondary | ICD-10-CM | POA: Insufficient documentation

## 2014-12-25 LAB — CBC
HCT: 48.3 % (ref 39.0–52.0)
Hemoglobin: 16.2 g/dL (ref 13.0–17.0)
MCH: 33.3 pg (ref 26.0–34.0)
MCHC: 33.5 g/dL (ref 30.0–36.0)
MCV: 99.4 fL (ref 78.0–100.0)
Platelets: 163 10*3/uL (ref 150–400)
RBC: 4.86 MIL/uL (ref 4.22–5.81)
RDW: 13.9 % (ref 11.5–15.5)
WBC: 7.1 10*3/uL (ref 4.0–10.5)

## 2014-12-25 LAB — BASIC METABOLIC PANEL
ANION GAP: 9 (ref 5–15)
BUN: 20 mg/dL (ref 6–23)
CALCIUM: 9 mg/dL (ref 8.4–10.5)
CO2: 26 mmol/L (ref 19–32)
CREATININE: 1.6 mg/dL — AB (ref 0.50–1.35)
Chloride: 102 mmol/L (ref 96–112)
GFR calc non Af Amer: 44 mL/min — ABNORMAL LOW (ref >90)
GFR, EST AFRICAN AMERICAN: 51 mL/min — AB (ref >90)
Glucose, Bld: 100 mg/dL — ABNORMAL HIGH (ref 70–99)
Potassium: 4.5 mmol/L (ref 3.5–5.1)
Sodium: 137 mmol/L (ref 135–145)

## 2014-12-25 MED ORDER — CHLORHEXIDINE GLUCONATE 4 % EX LIQD: 1.0000 "application " | Freq: Once | CUTANEOUS | Status: DC

## 2014-12-25 NOTE — Pre-Procedure Instructions (Signed)
DARRAGH NAY  12/25/2014   Your procedure is scheduled on:  December 31, 2014  Report to [facility] Admitting at 10 AM.  Call this number if you have problems the morning of surgery: [phone]   Remember:   Do not eat food or drink liquids after midnight.   Take these medicines the morning of surgery with A SIP OF WATER: digoxin (LANOXIN), metoprolol (LOPRESSOR)    STOP ASPIRIN-AS DIRECTED, HERBAL MEDICATIONS, NSAIDS (ADVIL, ALEVE, IBUPROFEN) 7 DAYS PRIOR TO SURGERY   Do not wear jewelry.  Do not wear lotions, powders, or colognes. You may wear deodorant.  Men may shave face and neck.  Do not bring valuables to the hospital.  [facility] is not responsible  for any belongings or valuables.               Contacts, dentures or bridgework may not be worn into surgery.  Leave suitcase in the car. After surgery it may be brought to your room.  For patients admitted to the hospital, discharge time is determined by your                treatment team.               Patients discharged the day of surgery will not be allowed to drive  home.  Name and phone number of your driver: family/friend  Special Instructions: "PREPARING FOR SURGERY"   Please read over the following fact sheets that you were given: Pain Booklet, Coughing and Deep Breathing and Surgical Site Infection Prevention

## 2014-12-26 NOTE — Progress Notes (Signed)
Anesthesia Chart Review:  Pt is 64 year old male scheduled for open incisional hernia repair with mesh on 12/31/2014 with Dr. Rosendo Gros.   Cardiologist is Dr. Agustin Cree at [facility].   PMH includes: CAD (DES to [place] 06/2012), atrial fibrillation. Current smoker. BMI 24. S/p laparoscopic ostomy takedown and anastamosis 06/06/13, s/p exploratory laparatomy and sigmoid colon resection on 09/02/12.   Medications include: digoxin, metoprolol, ASA.   Preoperative labs reviewed.  Cr 1.6. Previous results from over a year ago are normal. In 09/2012 pt had acute renal injury from contrast dye; subsequent to that cr normal until 1.6 result. Routed result to Dr. Rosendo Gros for his consideration. Will check BMET DOS.   EKG: Atrial fibrillation with slow ventricular response  Echo 09/09/2012: - Left ventricle: The cavity size was normal. Wall thickness was increased in a pattern of mild LVH. Systolic function was normal. The estimated ejection fraction was in the range of 60% to 65%. Wall motion was normal; there were no regional wall motion abnormalities. The study is not technically sufficient to allow evaluation of LV diastolic function. - Mitral valve: Moderate prolapse, involving the posterior leaflet. Mild regurgitation. - Left atrium: The atrium was moderately dilated. - Right atrium: The atrium was mildly dilated. - Pulmonary arteries: PA peak pressure: 8mm Hg (S). 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  Pt has cardiac clearance from Dr. Agustin Cree with the following comments: "He will need to be in telemetry floor after surgery. He needs to stay on metoprolol as well as digoxin are on surgical time from heart rate control point of view as well as from cardioprotective point of view. While he will be nothing by mouth intravenous  metoprolol 5mg  every 8 hours as well as digoxin IV should be given."  If cr stable DOS, I anticipate pt can proceed as scheduled.  Willeen Cass, FNP-BC [facility]/Anesthesiology Phone: [phone] 12/26/2014 4:43 PM

## 2014-12-30 MED ORDER — CEFAZOLIN SODIUM-DEXTROSE 2-3 GM-% IV SOLR
2.0000 g | INTRAVENOUS | Status: AC
  Administered 2014-12-31: 2 g via INTRAVENOUS
  Filled 2014-12-30: qty 50

## 2014-12-31 ENCOUNTER — Encounter (HOSPITAL_COMMUNITY): Payer: Self-pay | Admitting: Anesthesiology

## 2014-12-31 ENCOUNTER — Ambulatory Visit (HOSPITAL_COMMUNITY): Payer: BLUE CROSS/BLUE SHIELD | Admitting: Anesthesiology

## 2014-12-31 ENCOUNTER — Ambulatory Visit (HOSPITAL_COMMUNITY): Payer: BLUE CROSS/BLUE SHIELD | Admitting: Emergency Medicine

## 2014-12-31 ENCOUNTER — Inpatient Hospital Stay (HOSPITAL_COMMUNITY)
Admission: RE | Admit: 2014-12-31 | Discharge: 2015-01-06 | DRG: 355 | Disposition: A | Discharge status: Home / Self Care | Payer: BLUE CROSS/BLUE SHIELD | Source: Ambulatory Visit | Attending: General Surgery | Admitting: General Surgery

## 2014-12-31 ENCOUNTER — Encounter (HOSPITAL_COMMUNITY)
Admission: RE | Disposition: A | Discharge status: Home / Self Care | Payer: Self-pay | Source: Ambulatory Visit | Attending: General Surgery

## 2014-12-31 DIAGNOSIS — Z79899 Other long term (current) drug therapy: Secondary | ICD-10-CM | POA: Diagnosis not present

## 2014-12-31 DIAGNOSIS — N320 Bladder-neck obstruction: Secondary | ICD-10-CM | POA: Diagnosis not present

## 2014-12-31 DIAGNOSIS — K432 Incisional hernia without obstruction or gangrene: Secondary | ICD-10-CM | POA: Diagnosis present

## 2014-12-31 DIAGNOSIS — Z7982 Long term (current) use of aspirin: Secondary | ICD-10-CM

## 2014-12-31 DIAGNOSIS — Z9889 Other specified postprocedural states: Secondary | ICD-10-CM

## 2014-12-31 DIAGNOSIS — Z8719 Personal history of other diseases of the digestive system: Secondary | ICD-10-CM

## 2014-12-31 DIAGNOSIS — I4891 Unspecified atrial fibrillation: Secondary | ICD-10-CM | POA: Diagnosis present

## 2014-12-31 DIAGNOSIS — R0602 Shortness of breath: Secondary | ICD-10-CM

## 2014-12-31 HISTORY — PX: INCISIONAL HERNIA REPAIR: SHX193

## 2014-12-31 HISTORY — DX: Personal history of other diseases of the digestive system: Z87.19

## 2014-12-31 HISTORY — DX: Unspecified osteoarthritis, unspecified site: M19.90

## 2014-12-31 HISTORY — DX: Chronic atrial fibrillation, unspecified: I48.20

## 2014-12-31 HISTORY — PX: INSERTION OF MESH: SHX5868

## 2014-12-31 HISTORY — DX: Other specified postprocedural states: Z98.890

## 2014-12-31 LAB — BASIC METABOLIC PANEL
Anion gap: 10 (ref 5–15)
BUN: 14 mg/dL (ref 6–23)
CALCIUM: 8.7 mg/dL (ref 8.4–10.5)
CO2: 24 mmol/L (ref 19–32)
Chloride: 107 mmol/L (ref 96–112)
Creatinine, Ser: 1.31 mg/dL (ref 0.50–1.35)
GFR, EST AFRICAN AMERICAN: 65 mL/min — AB (ref >90)
GFR, EST NON AFRICAN AMERICAN: 56 mL/min — AB (ref >90)
Glucose, Bld: 87 mg/dL (ref 70–99)
Potassium: 5 mmol/L (ref 3.5–5.1)
Sodium: 141 mmol/L (ref 135–145)

## 2014-12-31 LAB — SURGICAL PCR SCREEN
MRSA, PCR: NEGATIVE
Staphylococcus aureus: NEGATIVE

## 2014-12-31 SURGERY — REPAIR, HERNIA, INCISIONAL
Anesthesia: General | Site: Abdomen

## 2014-12-31 MED ORDER — DIPHENHYDRAMINE HCL 12.5 MG/5ML PO ELIX: 12.5000 mg | ORAL_SOLUTION | Freq: Four times a day (QID) | ORAL | Status: DC | PRN

## 2014-12-31 MED ORDER — GLYCOPYRROLATE 0.2 MG/ML IJ SOLN
INTRAMUSCULAR | Status: DC | PRN
  Administered 2014-12-31: .8 mg via INTRAVENOUS

## 2014-12-31 MED ORDER — 0.9 % SODIUM CHLORIDE (POUR BTL) OPTIME
TOPICAL | Status: DC | PRN
  Administered 2014-12-31 (×2): 1000 mL

## 2014-12-31 MED ORDER — SODIUM CHLORIDE 0.9 % IJ SOLN: 9.0000 mL | INTRAMUSCULAR | Status: DC | PRN

## 2014-12-31 MED ORDER — ROCURONIUM BROMIDE 100 MG/10ML IV SOLN
INTRAVENOUS | Status: DC | PRN
  Administered 2014-12-31: 5 mg via INTRAVENOUS
  Administered 2014-12-31: 45 mg via INTRAVENOUS

## 2014-12-31 MED ORDER — DIGOXIN 250 MCG PO TABS
0.2500 mg | ORAL_TABLET | Freq: Every day | ORAL | Status: DC
  Administered 2015-01-01 – 2015-01-06 (×6): 0.25 mg via ORAL
  Filled 2014-12-31 (×7): qty 1

## 2014-12-31 MED ORDER — PROPOFOL 10 MG/ML IV BOLUS
INTRAVENOUS | Status: AC
  Filled 2014-12-31: qty 20

## 2014-12-31 MED ORDER — VECURONIUM BROMIDE 10 MG IV SOLR
INTRAVENOUS | Status: DC | PRN
  Administered 2014-12-31: 2 mg via INTRAVENOUS
  Administered 2014-12-31: 1 mg via INTRAVENOUS
  Administered 2014-12-31: 2 mg via INTRAVENOUS
  Administered 2014-12-31: 1 mg via INTRAVENOUS

## 2014-12-31 MED ORDER — ONDANSETRON HCL 4 MG/2ML IJ SOLN
4.0000 mg | Freq: Four times a day (QID) | INTRAMUSCULAR | Status: DC | PRN
Start: 2014-12-31 — End: 2015-01-06
  Filled 2014-12-31: qty 2

## 2014-12-31 MED ORDER — MUPIROCIN 2 % EX OINT
TOPICAL_OINTMENT | CUTANEOUS | Status: AC
  Filled 2014-12-31: qty 22

## 2014-12-31 MED ORDER — DIPHENHYDRAMINE HCL 50 MG/ML IJ SOLN
12.5000 mg | Freq: Four times a day (QID) | INTRAMUSCULAR | Status: DC | PRN
  Administered 2015-01-02 (×2): 12.5 mg via INTRAVENOUS
  Filled 2014-12-31 (×2): qty 1

## 2014-12-31 MED ORDER — ONDANSETRON HCL 4 MG/2ML IJ SOLN
4.0000 mg | Freq: Four times a day (QID) | INTRAMUSCULAR | Status: DC | PRN
Start: 2014-12-31 — End: 2015-01-03
  Administered 2015-01-02: 4 mg via INTRAVENOUS

## 2014-12-31 MED ORDER — PROPOFOL 10 MG/ML IV BOLUS
INTRAVENOUS | Status: DC | PRN
  Administered 2014-12-31: 150 mg via INTRAVENOUS
  Administered 2014-12-31: 20 mg via INTRAVENOUS

## 2014-12-31 MED ORDER — DEXAMETHASONE SODIUM PHOSPHATE 10 MG/ML IJ SOLN
INTRAMUSCULAR | Status: DC | PRN
  Administered 2014-12-31: 10 mg via INTRAVENOUS

## 2014-12-31 MED ORDER — DEXTROSE-NACL 5-0.9 % IV SOLN
INTRAVENOUS | Status: DC
  Administered 2015-01-01 – 2015-01-04 (×9): via INTRAVENOUS
  Filled 2014-12-31: qty 1000

## 2014-12-31 MED ORDER — ACETAMINOPHEN 10 MG/ML IV SOLN
INTRAVENOUS | Status: DC | PRN
  Administered 2014-12-31: 1000 mg via INTRAVENOUS

## 2014-12-31 MED ORDER — MUPIROCIN 2 % EX OINT
TOPICAL_OINTMENT | Freq: Two times a day (BID) | CUTANEOUS | Status: DC
  Administered 2014-12-31: 1 via NASAL

## 2014-12-31 MED ORDER — LIDOCAINE HCL (CARDIAC) 20 MG/ML IV SOLN
INTRAVENOUS | Status: DC | PRN
  Administered 2014-12-31: 20 mg via INTRAVENOUS

## 2014-12-31 MED ORDER — HYDROMORPHONE 0.3 MG/ML IV SOLN
INTRAVENOUS | Status: AC
  Filled 2014-12-31: qty 25

## 2014-12-31 MED ORDER — HYDROMORPHONE 0.3 MG/ML IV SOLN
INTRAVENOUS | Status: DC
  Administered 2014-12-31: 1.5 mg via INTRAVENOUS
  Administered 2014-12-31: 1.2 mg via INTRAVENOUS
  Administered 2014-12-31: 16:00:00 via INTRAVENOUS
  Administered 2015-01-01: 0.6 mg via INTRAVENOUS
  Administered 2015-01-01: 1.8 mg via INTRAVENOUS
  Administered 2015-01-01: 1.3 mg via INTRAVENOUS
  Administered 2015-01-01 – 2015-01-02 (×4): 0.6 mg via INTRAVENOUS
  Administered 2015-01-02: 1.31 mg via INTRAVENOUS
  Administered 2015-01-02: 0.8 mg via INTRAVENOUS
  Administered 2015-01-02: 1.8 mg via INTRAVENOUS
  Administered 2015-01-02: 0.9 mg via INTRAVENOUS
  Administered 2015-01-02: 18:00:00 via INTRAVENOUS
  Administered 2015-01-03: 0.9 mg via INTRAVENOUS
  Administered 2015-01-03: 1.5 mg via INTRAVENOUS
  Filled 2014-12-31 (×2): qty 25

## 2014-12-31 MED ORDER — NALOXONE HCL 0.4 MG/ML IJ SOLN: 0.4000 mg | INTRAMUSCULAR | Status: DC | PRN

## 2014-12-31 MED ORDER — LABETALOL HCL 5 MG/ML IV SOLN
5.0000 mg | INTRAVENOUS | Status: AC | PRN
  Administered 2014-12-31 (×4): 5 mg via INTRAVENOUS

## 2014-12-31 MED ORDER — ACETAMINOPHEN 10 MG/ML IV SOLN
INTRAVENOUS | Status: AC
  Filled 2014-12-31: qty 100

## 2014-12-31 MED ORDER — MIDAZOLAM HCL 2 MG/2ML IJ SOLN
INTRAMUSCULAR | Status: AC
  Filled 2014-12-31: qty 2

## 2014-12-31 MED ORDER — ENOXAPARIN SODIUM 40 MG/0.4ML ~~LOC~~ SOLN
40.0000 mg | SUBCUTANEOUS | Status: DC
  Administered 2015-01-01 – 2015-01-05 (×6): 40 mg via SUBCUTANEOUS
  Filled 2014-12-31 (×6): qty 0.4

## 2014-12-31 MED ORDER — ONDANSETRON HCL 4 MG/2ML IJ SOLN
INTRAMUSCULAR | Status: DC | PRN
  Administered 2014-12-31: 4 mg via INTRAVENOUS

## 2014-12-31 MED ORDER — GLYCOPYRROLATE 0.2 MG/ML IJ SOLN
INTRAMUSCULAR | Status: AC
  Filled 2014-12-31: qty 4

## 2014-12-31 MED ORDER — ONDANSETRON HCL 4 MG/2ML IJ SOLN
INTRAMUSCULAR | Status: AC
Start: 2014-12-31 — End: 2014-12-31
  Filled 2014-12-31: qty 2

## 2014-12-31 MED ORDER — SUGAMMADEX SODIUM 200 MG/2ML IV SOLN
INTRAVENOUS | Status: AC
  Filled 2014-12-31: qty 2

## 2014-12-31 MED ORDER — MIDAZOLAM HCL 5 MG/5ML IJ SOLN
INTRAMUSCULAR | Status: DC | PRN
  Administered 2014-12-31: 2 mg via INTRAVENOUS

## 2014-12-31 MED ORDER — HYDROMORPHONE HCL 1 MG/ML IJ SOLN
INTRAMUSCULAR | Status: AC
  Administered 2014-12-31: 0.5 mg via INTRAVENOUS
  Filled 2014-12-31: qty 1

## 2014-12-31 MED ORDER — LIDOCAINE HCL (CARDIAC) 20 MG/ML IV SOLN
INTRAVENOUS | Status: AC
  Filled 2014-12-31: qty 5

## 2014-12-31 MED ORDER — BISACODYL 5 MG PO TBEC: 5.0000 mg | DELAYED_RELEASE_TABLET | Freq: Every day | ORAL | Status: DC | PRN

## 2014-12-31 MED ORDER — SUGAMMADEX SODIUM 200 MG/2ML IV SOLN
INTRAVENOUS | Status: DC | PRN
  Administered 2014-12-31: 150 mg via INTRAVENOUS

## 2014-12-31 MED ORDER — ROCURONIUM BROMIDE 50 MG/5ML IV SOLN
INTRAVENOUS | Status: AC
  Filled 2014-12-31: qty 1

## 2014-12-31 MED ORDER — MIDAZOLAM HCL 2 MG/2ML IJ SOLN: 0.5000 mg | Freq: Once | INTRAMUSCULAR | Status: DC | PRN

## 2014-12-31 MED ORDER — LACTATED RINGERS IV SOLN
INTRAVENOUS | Status: DC
  Administered 2014-12-31 (×2): via INTRAVENOUS

## 2014-12-31 MED ORDER — MEPERIDINE HCL 25 MG/ML IJ SOLN: 6.2500 mg | INTRAMUSCULAR | Status: DC | PRN

## 2014-12-31 MED ORDER — FENTANYL CITRATE (PF) 250 MCG/5ML IJ SOLN
INTRAMUSCULAR | Status: AC
  Filled 2014-12-31: qty 5

## 2014-12-31 MED ORDER — DEXAMETHASONE SODIUM PHOSPHATE 10 MG/ML IJ SOLN
INTRAMUSCULAR | Status: AC
  Filled 2014-12-31: qty 1

## 2014-12-31 MED ORDER — PROMETHAZINE HCL 25 MG/ML IJ SOLN: 6.2500 mg | INTRAMUSCULAR | Status: DC | PRN

## 2014-12-31 MED ORDER — HYDROMORPHONE HCL 1 MG/ML IJ SOLN
0.2500 mg | INTRAMUSCULAR | Status: DC | PRN
  Administered 2014-12-31 (×4): 0.5 mg via INTRAVENOUS

## 2014-12-31 MED ORDER — METOPROLOL TARTRATE 50 MG PO TABS
50.0000 mg | ORAL_TABLET | Freq: Every day | ORAL | Status: DC
  Administered 2014-12-31 – 2015-01-06 (×7): 50 mg via ORAL
  Filled 2014-12-31 (×7): qty 1

## 2014-12-31 MED ORDER — NEOSTIGMINE METHYLSULFATE 10 MG/10ML IV SOLN
INTRAVENOUS | Status: AC
  Filled 2014-12-31: qty 1

## 2014-12-31 MED ORDER — STERILE WATER FOR INJECTION IJ SOLN
INTRAMUSCULAR | Status: AC
  Filled 2014-12-31: qty 10

## 2014-12-31 MED ORDER — FENTANYL CITRATE (PF) 100 MCG/2ML IJ SOLN
INTRAMUSCULAR | Status: DC | PRN
  Administered 2014-12-31 (×2): 50 ug via INTRAVENOUS
  Administered 2014-12-31: 250 ug via INTRAVENOUS

## 2014-12-31 MED ORDER — LABETALOL HCL 5 MG/ML IV SOLN
INTRAVENOUS | Status: AC
  Filled 2014-12-31: qty 4

## 2014-12-31 MED ORDER — NEOSTIGMINE METHYLSULFATE 10 MG/10ML IV SOLN
INTRAVENOUS | Status: DC | PRN
  Administered 2014-12-31: 4 mg via INTRAVENOUS

## 2014-12-31 MED ORDER — PANTOPRAZOLE SODIUM 40 MG IV SOLR
40.0000 mg | Freq: Every day | INTRAVENOUS | Status: DC
  Administered 2014-12-31 – 2015-01-02 (×3): 40 mg via INTRAVENOUS
  Filled 2014-12-31: qty 40

## 2014-12-31 MED ORDER — VECURONIUM BROMIDE 10 MG IV SOLR
INTRAVENOUS | Status: AC
  Filled 2014-12-31: qty 10

## 2014-12-31 SURGICAL SUPPLY — 45 items
BINDER ABDOMINAL 12 ML 46-62 (SOFTGOODS) ×1 IMPLANT
BIOPATCH RED 1 DISK 7.0 (GAUZE/BANDAGES/DRESSINGS) ×1 IMPLANT
BLADE SURG 11 STRL SS (BLADE) ×1 IMPLANT
BLADE SURG ROTATE 9660 (MISCELLANEOUS) ×1 IMPLANT
COVER SURGICAL LIGHT HANDLE (MISCELLANEOUS) ×2 IMPLANT
DRAIN CHANNEL 19F RND (DRAIN) ×2 IMPLANT
DRAPE INCISE IOBAN 66X45 STRL (DRAPES) ×2 IMPLANT
DRAPE LAPAROSCOPIC ABDOMINAL (DRAPES) ×2 IMPLANT
DRAPE UTILITY XL STRL (DRAPES) ×4 IMPLANT
DRSG TEGADERM 4X4.75 (GAUZE/BANDAGES/DRESSINGS) ×1 IMPLANT
ELECT CAUTERY BLADE 6.4 (BLADE) ×2 IMPLANT
ELECT REM PT RETURN 9FT ADLT (ELECTROSURGICAL) ×2
ELECTRODE REM PT RTRN 9FT ADLT (ELECTROSURGICAL) ×1 IMPLANT
EVACUATOR SILICONE 100CC (DRAIN) ×2 IMPLANT
GAUZE SPONGE 4X4 12PLY STRL (GAUZE/BANDAGES/DRESSINGS) ×2 IMPLANT
GLOVE BIO SURGEON STRL SZ7 (GLOVE) ×2 IMPLANT
GLOVE BIO SURGEON STRL SZ7.5 (GLOVE) ×2 IMPLANT
GLOVE BIOGEL PI IND STRL 7.0 (GLOVE) IMPLANT
GLOVE BIOGEL PI IND STRL 7.5 (GLOVE) IMPLANT
GLOVE BIOGEL PI IND STRL 8 (GLOVE) ×1 IMPLANT
GLOVE BIOGEL PI INDICATOR 7.0 (GLOVE) ×1
GLOVE BIOGEL PI INDICATOR 7.5 (GLOVE) ×1
GLOVE BIOGEL PI INDICATOR 8 (GLOVE) ×1
GOWN STRL REUS W/ TWL LRG LVL3 (GOWN DISPOSABLE) ×3 IMPLANT
GOWN STRL REUS W/ TWL XL LVL3 (GOWN DISPOSABLE) ×1 IMPLANT
GOWN STRL REUS W/TWL LRG LVL3 (GOWN DISPOSABLE) ×6
GOWN STRL REUS W/TWL XL LVL3 (GOWN DISPOSABLE) ×2
KIT BASIN OR (CUSTOM PROCEDURE TRAY) ×2 IMPLANT
KIT ROOM TURNOVER OR (KITS) ×2 IMPLANT
MARKER SKIN DUAL TIP RULER LAB (MISCELLANEOUS) ×1 IMPLANT
MESH VENTRALIGHT ST 10X13IN (Mesh General) ×1 IMPLANT
NS IRRIG 1000ML POUR BTL (IV SOLUTION) ×2 IMPLANT
PACK GENERAL/GYN (CUSTOM PROCEDURE TRAY) ×2 IMPLANT
PAD ARMBOARD 7.5X6 YLW CONV (MISCELLANEOUS) ×4 IMPLANT
RETAINER VISCERA MED (MISCELLANEOUS) ×1 IMPLANT
SPONGE GAUZE 4X4 12PLY STER LF (GAUZE/BANDAGES/DRESSINGS) ×1 IMPLANT
SPONGE LAP 18X18 X RAY DECT (DISPOSABLE) ×1 IMPLANT
STAPLER VISISTAT 35W (STAPLE) ×3 IMPLANT
SURESTEP FOLEY TRAY SYSTEM ×1 IMPLANT
SUT ETHILON 3 0 FSL (SUTURE) ×2 IMPLANT
SUT NOVA NAB GS-21 0 18 T12 DT (SUTURE) ×12 IMPLANT
SUT VICRYL AB 2 0 TIES (SUTURE) ×2 IMPLANT
TOWEL OR 17X24 6PK STRL BLUE (TOWEL DISPOSABLE) ×2 IMPLANT
TOWEL OR 17X26 10 PK STRL BLUE (TOWEL DISPOSABLE) ×2 IMPLANT
TRAY FOLEY CATH SILVER 16FR (SET/KITS/TRAYS/PACK) ×1 IMPLANT

## 2014-12-31 NOTE — Anesthesia Postprocedure Evaluation (Signed)
  Anesthesia Post-op Note  Patient: Johnathan Bauer  Procedure(s) Performed: Procedure(s) (LRB): OPEN INCISIONAL HERNIA REPAIR WITH MESH (TAR PROCEDURE) (N/A) INSERTION OF MESH (N/A)  Patient Location: PACU  Anesthesia Type: General  Level of Consciousness: awake and alert   Airway and Oxygen Therapy: Patient Spontanous Breathing  Post-op Pain: mild  Post-op Assessment: Post-op Vital signs reviewed, Patient's Cardiovascular Status Stable, Respiratory Function Stable, Patent Airway and No signs of Nausea or vomiting  Last Vitals:  Filed Vitals:   12/31/14 1715  BP:   Pulse: 117  Temp:   Resp: 22    Post-op Vital Signs: stable   Complications: No apparent anesthesia complications

## 2014-12-31 NOTE — Anesthesia Procedure Notes (Addendum)
Procedure Name: Intubation Date/Time: 12/31/2014 11:22 AM Performed by: Anne Fu Pre-anesthesia Checklist: Patient identified, Emergency Drugs available, Suction available, Patient being monitored and Timeout performed Patient Re-evaluated:Patient Re-evaluated prior to inductionOxygen Delivery Method: Circle system utilized Preoxygenation: Pre-oxygenation with 100% oxygen Intubation Type: IV induction Ventilation: Mask ventilation without difficulty Laryngoscope Size: Mac and 4 Grade View: Grade II Tube type: Oral Tube size: 7.5 mm Number of attempts: 2 Airway Equipment and Method: Stylet and Bougie stylet Placement Confirmation: ETT inserted through vocal cords under direct vision,  positive ETCO2,  CO2 detector and breath sounds checked- equal and bilateral Secured at: 21 cm Tube secured with: Tape Dental Injury: Teeth and Oropharynx as per pre-operative assessment

## 2014-12-31 NOTE — Anesthesia Preprocedure Evaluation (Addendum)
Anesthesia Evaluation  Patient identified by MRN, date of birth, ID band Patient awake    Reviewed: Allergy & Precautions, NPO status , Patient's Chart, lab work & pertinent test results, reviewed documented beta blocker date and time   History of Anesthesia Complications Negative for: history of anesthetic complications  Airway Mallampati: II  TM Distance: >3 FB     Dental  (+) Caps, Implants, Dental Advisory Given   Pulmonary COPDCurrent Smoker,  breath sounds clear to auscultation        Cardiovascular hypertension, Pt. on medications and Pt. on home beta blockers - angina+ CAD and + Cardiac Stents (2013 DES OM) + dysrhythmias Atrial Fibrillation Rhythm:Irregular Rate:Normal  '14 ECHO: EF 60-65%, Moderate MV prolapse, involving the posterior leaflet. Mild MR   Neuro/Psych negative neurological ROS     GI/Hepatic negative GI ROS, (+)     substance abuse  alcohol use,   Endo/Other  negative endocrine ROS  Renal/GU Renal InsufficiencyRenal disease (creat 1.60)     Musculoskeletal   Abdominal (+) + obese,   Peds  Hematology negative hematology ROS (+)   Anesthesia Other Findings   Reproductive/Obstetrics                           Anesthesia Physical Anesthesia Plan  ASA: III  Anesthesia Plan: General   Post-op Pain Management:    Induction: Intravenous  Airway Management Planned: Oral ETT  Additional Equipment:   Intra-op Plan:   Post-operative Plan: Extubation in OR  Informed Consent: I have reviewed the patients History and Physical, chart, labs and discussed the procedure including the risks, benefits and alternatives for the proposed anesthesia with the patient or authorized representative who has indicated his/her understanding and acceptance.   Dental advisory given  Plan Discussed with: CRNA and Surgeon  Anesthesia Plan Comments: (Plan routine monitors, GETA)         Anesthesia Quick Evaluation

## 2014-12-31 NOTE — Transfer of Care (Signed)
Immediate Anesthesia Transfer of Care Note  Patient: Johnathan Bauer  Procedure(s) Performed: Procedure(s): OPEN INCISIONAL HERNIA REPAIR WITH MESH (TAR PROCEDURE) (N/A) INSERTION OF MESH (N/A)  Patient Location: PACU  Anesthesia Type:General  Level of Consciousness:  sedated, patient cooperative and responds to stimulation  Airway & Oxygen Therapy:Patient Spontanous Breathing and Patient connected to face mask oxgen  Post-op Assessment:  Report given to PACU RN and Post -op Vital signs reviewed and stable  Post vital signs:  Reviewed and stable  Last Vitals:  Filed Vitals:   12/31/14 1351  BP: 181/132  Pulse: 103  Temp:   Resp: 19    Complications: No apparent anesthesia complications

## 2014-12-31 NOTE — Op Note (Signed)
12/31/2014  1:34 PM  PATIENT:  [name]  64 y.o. male  PRE-OPERATIVE DIAGNOSIS:  INCISIONAL HERNIA  POST-OPERATIVE DIAGNOSIS:  INCISIONAL HERNIA  PROCEDURE:  Procedure(s): OPEN INCISIONAL HERNIA REPAIR WITH MESH (N/A) INSERTION OF MESH (N/A)  SURGEON:  Surgeon(s) and Role:    * Ralene Ok, MD - Primary    * Stark Klein, MD - Assisting  ANESTHESIA:   general  EBL:  Total I/O In: -  Out: 90 [Urine:90]  BLOOD ADMINISTERED:none  DRAINS: (19Fr) Jackson-Pratt drain(s) with closed bulb suction in the sub-q space   LOCAL MEDICATIONS USED:  NONE  SPECIMEN:  No Specimen  DISPOSITION OF SPECIMEN:  N/A  COUNTS:  YES  TOURNIQUET:  * No tourniquets in log *  DICTATION: .Dragon Dictation  Details of the procedure:  After the patient was consented he was taken back to the operating room and placed in the supine position with bilateral SCDs in place. The patient was prepped and draped in usual sterile fashion. Antibiotics were confirmed and timeout was called and all facts verified.  At this time I proceeded to excise the skin scar. This was discarded. I proceeded to use electrocautery to maintain hemostasis and dissection took place in the most superior portion of the wound down to the subcutaneous tissues fat to the anterior fascia. This was incised. The fascia was elevated and the cardia was used to help with hemostasis the peritoneum was bluntly entered. There appeared to be some omentum within the midline in the superior portion of the wound. This was carefully dissected away from the abdominal wall. The omentum lay in the midline approximately the first one third portion of the wound. Inferior to this there was no adhesions to the anterior abdominal wall. The fascia was incised to the rest of incision. The hernia sacs were identified and seen to be a Swiss cheese defect. The hernia sacs were excised.  There were minimal interior adhesions.  At this time proceeded to  create subcutaneous flaps to the left portion of the anterior abdominal wall. All perforators that could be saved were kept.  I proceeded to create subcutaneous flaps approximately 6-8 cm lateral of the hernia. I proceeded to dissect both superiorly and inferiorly leaving approximately 4 cm area of healthy fascia.   Once the left side was appropriately dissected away and proceeded to turn my attention to the right portion anterior abdominal wall within subcutaneous flaps were created in similar fashion creating in a similar fashion 4-6 cm of fascia lateral to the hernia.    At this time a piece of Bard ventral light mesh measuring 10 x 13" was placed into the abdomen. #1 Novafils were used in interrupted fashion circumferentially, and trans-fascially to secure the mesh circumferentially. The bites were taken approximately one segment apart. Care was taken not to allow any underlying bowel to slip in between the mesh and intra-abdominal wall. There is irrigated out with sterile saline. Once this was secured circumferentially the fascia was reapproximated in the midline using an 0 PDS in a standard running fashion. The midline fascia was approximated with undue tension. The subcutaneous layer was then irrigated out with sterile saline.  At this time 2, 59 [place] Blake drain in place via stab incisions under the subcutaneous flaps.  These were secured to the anterior abdominal skin using 3-0 nylon's in interrupted fashion.  At this time the skin was reapproximated using skin staples. The patient tolerated the procedure well was taken to the recovery room with  an abdominal binder in stable condition.   PLAN OF CARE: Admit to inpatient   PATIENT DISPOSITION:  PACU - hemodynamically stable.   Delay start of Pharmacological VTE agent (>24hrs) due to surgical blood loss or risk of bleeding: not applicable

## 2014-12-31 NOTE — H&P (Signed)
History of Present Illness Ralene Ok MD; 09/09/2014 11:38 AM) Patient words: hernia.  The patient is a 64 year old male who presents with an incisional hernia. The patient is a 64 year old male who previously underwent exploratory laparotomy with Hartmann's procedure on 09/02/2012. Subsequent to this patient underwent laparoscopic colostomy takedown and anastomosis on 06/06/2013. Patient's been doing well from his colon surgery. He's having good a normal bowel function. Patient has returned with an incisional hernia in his midline incision. His ostomy incision site is well-healed, Without hernia. Patient has had no pain in this area however he states that it's unsightly.   Other Problems Briant Cedar, CMA; 09/09/2014 11:16 AM) Atrial Fibrillation Diverticulosis  Past Surgical History Briant Cedar, [place]; 09/09/2014 11:16 AM) Resection of Stomach  Diagnostic Studies History Briant Cedar, CMA; 09/09/2014 11:16 AM) Colonoscopy 1-5 years ago  Allergies Briant Cedar, [place]; 09/09/2014 11:18 AM) POISON IVY EXTRACT  Medication History Briant Cedar, CMA; 09/09/2014 11:18 AM) Metoprolol Tartrate (50MG  Tablet, Oral) Active. Digoxin (250MCG Tablet, Oral) Active. Baby Aspirin (81MG  Tablet Chewable, Oral) Active.  Social History Briant Cedar, [place]; 09/09/2014 11:16 AM) Alcohol use Occasional alcohol use. Illicit drug use Remotely quit drug use. Tobacco use Current every day smoker.  Family History Briant Cedar, [place]; 09/09/2014 11:16 AM) Cancer Father. Melanoma Father.  Review of Systems Briant Cedar CMA; 09/09/2014 11:16 AM) General Not Present- Appetite Loss, Chills, Fatigue, Fever, Night Sweats, Weight Gain and Weight Loss. Skin Not Present- Change in Wart/Mole, Dryness, Hives, Jaundice, New Lesions, Non-Healing Wounds, Rash and Ulcer. HEENT Present- Wears glasses/contact lenses. Not Present- Earache, Hearing Loss, Hoarseness, Nose Bleed, Oral Ulcers, Ringing  in the Ears, Seasonal Allergies, Sinus Pain, Sore Throat, Visual Disturbances and Yellow Eyes. Respiratory Not Present- Bloody sputum, Chronic Cough, Difficulty Breathing, Snoring and Wheezing. Cardiovascular Not Present- Chest Pain, Difficulty Breathing Lying Down, Leg Cramps, Palpitations, Rapid Heart Rate, Shortness of Breath and Swelling of Extremities. Gastrointestinal Not Present- Abdominal Pain, Bloating, Bloody Stool, Change in Bowel Habits, Chronic diarrhea, Constipation, Difficulty Swallowing, Excessive gas, Gets full quickly at meals, Hemorrhoids, Indigestion, Nausea, Rectal Pain and Vomiting.   Vitals Briant Cedar CMA; 09/09/2014 11:19 AM) 09/09/2014 11:18 AM Weight: 174.13 lb Height: 71in Body Surface Area: 1.99 m Body Mass Index: 24.29 kg/m Temp.: 98.59F  Pulse: 66 (Regular)  BP: 130/82 (Sitting, Left Arm, Standard)    Physical Exam Ralene Ok MD; 09/09/2014 11:39 AM) General Mental Status-Alert. General Appearance-Consistent with stated age. Hydration-Well hydrated. Voice-Normal.  Chest and Lung Exam Chest and lung exam reveals -quiet, even and easy respiratory effort with no use of accessory muscles and on auscultation, normal breath sounds, no adventitious sounds and normal vocal resonance. Inspection Chest Wall - Normal. Back - normal.  Cardiovascular Cardiovascular examination reveals -normal heart sounds, regular rate and rhythm with no murmurs and normal pedal pulses bilaterally.  Abdomen Inspection Skin - Scar - no surgical scars. Hernias - Incisional - Reducible(Approximately a 6 similar defect in the midline on palpation Reducible No hernia at ostomy site.). Palpation/Percussion Palpation and Percussion of the abdomen reveal - Soft, Non Tender, No Rebound tenderness, No Rigidity (guarding) and No hepatosplenomegaly. Auscultation Auscultation of the abdomen reveals - Bowel sounds normal.  Neurologic Neurologic evaluation  reveals -alert and oriented x 3 with no impairment of recent or remote memory. Mental Status-Normal.  Musculoskeletal Normal Exam - Left-Upper Extremity Strength Normal and Lower Extremity Strength Normal. Normal Exam - Right-Upper Extremity Strength Normal, Lower Extremity Weakness.    Assessment & Plan Ralene Ok MD; 09/09/2014  11:40 AM) INCISIONAL HERNIA, WITHOUT OBSTRUCTION OR GANGRENE (553.21  K43.2) Impression: The patient is a 64 year old male with an incisional hernia status post Hartman's procedure and Hartman's takedown.  1. The patient states he would like to proceed with open incisional hernia repair with mesh. 2. All risks and benefits were discussed with the patient to generally include, but not limited to: infection, bleeding, damage to surrounding structures, acute and chronic nerve pain, and recurrence. Alternatives were offered and described. All questions were answered and the patient voiced understanding of the procedure and wishes to proceed at this point with hernia repair.

## 2015-01-01 LAB — BASIC METABOLIC PANEL
Anion gap: 10 (ref 5–15)
BUN: 24 mg/dL — ABNORMAL HIGH (ref 6–23)
CALCIUM: 7.9 mg/dL — AB (ref 8.4–10.5)
CO2: 22 mmol/L (ref 19–32)
Chloride: 104 mmol/L (ref 96–112)
Creatinine, Ser: 1.49 mg/dL — ABNORMAL HIGH (ref 0.50–1.35)
GFR calc Af Amer: 55 mL/min — ABNORMAL LOW (ref >90)
GFR calc non Af Amer: 48 mL/min — ABNORMAL LOW (ref >90)
GLUCOSE: 238 mg/dL — AB (ref 70–99)
Potassium: 4.7 mmol/L (ref 3.5–5.1)
SODIUM: 136 mmol/L (ref 135–145)

## 2015-01-01 LAB — CBC
HCT: 44.8 % (ref 39.0–52.0)
Hemoglobin: 14.8 g/dL (ref 13.0–17.0)
MCH: 33.1 pg (ref 26.0–34.0)
MCHC: 33 g/dL (ref 30.0–36.0)
MCV: 100.2 fL — ABNORMAL HIGH (ref 78.0–100.0)
Platelets: 189 10*3/uL (ref 150–400)
RBC: 4.47 MIL/uL (ref 4.22–5.81)
RDW: 14.2 % (ref 11.5–15.5)
WBC: 13 10*3/uL — ABNORMAL HIGH (ref 4.0–10.5)

## 2015-01-01 LAB — GLUCOSE, CAPILLARY: Glucose-Capillary: 157 mg/dL — ABNORMAL HIGH (ref 70–99)

## 2015-01-01 NOTE — Progress Notes (Addendum)
Patient has not voided since foley cath removed this am. Has tried and has no sensation to void. Bladder scan performed and showed 389cc. Physician notified. Order received.

## 2015-01-01 NOTE — Progress Notes (Signed)
1 Day Post-Op  Subjective: Pt doing well.  No complaints this AM.  Some soreness  Objective: Vital signs in last 24 hours: Temp:  [96.9 F (36.1 C)-98.2 F (36.8 C)] 98.1 F (36.7 C) (04/27 0559) Pulse Rate:  [75-119] 97 (04/27 0559) Resp:  [12-30] 17 (04/27 0559) BP: (107-181)/(59-133) 130/87 mmHg (04/27 0559) SpO2:  [96 %-100 %] 98 % (04/27 0559) Weight:  [78.472 kg (173 lb)-79.379 kg (175 lb)] 79.379 kg (175 lb) (04/26 1812)    Intake/Output from previous day: 04/26 0701 - 04/27 0700 In: 1700 [I.V.:1700] Out: 1200 [Urine:965; Drains:235] Intake/Output this shift:    General appearance: alert and cooperative Cardio: regular rate and rhythm, S1, S2 normal, no murmur, click, rub or gallop GI: soft, approp ttp, JPs SS  Lab Results:   Recent Labs  01/01/15 0432  WBC 13.0*  HGB 14.8  HCT 44.8  PLT 189   BMET  Recent Labs  12/31/14 1043 01/01/15 0432  NA 141 136  K 5.0 4.7  CL 107 104  CO2 24 22  GLUCOSE 87 238*  BUN 14 24*  CREATININE 1.31 1.49*  CALCIUM 8.7 7.9*    Anti-infectives: Anti-infectives    Start     Dose/Rate Route Frequency Ordered Stop   12/31/14 1100  ceFAZolin (ANCEF) IVPB 2 g/50 mL premix     2 g 100 mL/hr over 30 Minutes Intravenous On call to O.R. 12/30/14 1247 12/31/14 1135      Assessment/Plan: s/p Procedure(s): OPEN INCISIONAL HERNIA REPAIR WITH MESH (TAR PROCEDURE) (N/A) INSERTION OF MESH (N/A) d/c foley  Con't ice chips PT today and mobilize  LOS: 1 day    [name]., Anne Hahn 01/01/2015

## 2015-01-01 NOTE — Evaluation (Signed)
Physical Therapy Evaluation Patient Details Name: Johnathan Bauer MRN: [id number] DOB: 1950-12-04 Today's Date: 01/01/2015   History of Present Illness  64 year old male who previously underwent exploratory laparotomy with Hartmann's procedure on 09/02/2012, laparoscopic colostomy takedown and anastomosis on 06/06/2013 presents now for incisional hernia repair.   Clinical Impression  Patient demonstrates deficits in functional mobility as indicated below. Will benefit from skilled PT to address deficits and maximize function. Spoke with patient and educated regarding mobility, positioning, splinting with pillow and techniques for transitions. Patient receptive and appreciative. Will continue to see and progress as tolerated.    Follow Up Recommendations Supervision - Intermittent    Equipment Recommendations  None recommended by PT    Recommendations for Other Services       Precautions / Restrictions Precautions Precaution Comments: abdominal binder (2 jp drains)      Mobility  Bed Mobility Overal bed mobility: Needs Assistance Bed Mobility: Rolling;Sidelying to Sit Rolling: Supervision Sidelying to sit: Min guard          Transfers Overall transfer level: Needs assistance Equipment used: Rolling walker (2 wheeled) Transfers: Sit to/from Stand Sit to Stand: Min guard         General transfer comment: VCs for hand placement and positioning  Ambulation/Gait Ambulation/Gait assistance: Supervision Ambulation Distance (Feet): 260 Feet Assistive device: Rolling walker (2 wheeled) Gait Pattern/deviations: Step-through pattern;Decreased stride length;Trunk flexed Gait velocity: decreased Gait velocity interpretation: Below normal speed for age/gender General Gait Details: VCs for increased cadence and normalized gait, patient able to make adjustments with cues  Stairs            Wheelchair Mobility    Modified Rankin (Stroke Patients Only)       Balance                                              Pertinent Vitals/Pain Pain Assessment: 0-10 Pain Score: 3  Pain Location: abdominal area Pain Descriptors / Indicators: Guarding;Grimacing;Sore Pain Intervention(s): Limited activity within patient's tolerance;Monitored during session;Repositioned;PCA encouraged    Home Living Family/patient expects to be discharged to:: Private residence Living Arrangements: Spouse/significant other Available Help at Discharge: Family Type of Home: House Home Access: Stairs to enter Entrance Stairs-Rails: None Technical brewer of Steps: 1 Home Layout: Multi-level        Prior Function Level of Independence: Independent               Hand Dominance   Dominant Hand: Right    Extremity/Trunk Assessment   Upper Extremity Assessment: Overall WFL for tasks assessed           Lower Extremity Assessment: Overall WFL for tasks assessed      Cervical / Trunk Assessment:  (limited mobility secondary to hernia repair)  Communication   Communication: No difficulties  Cognition Arousal/Alertness: Awake/alert Behavior During Therapy: WFL for tasks assessed/performed Overall Cognitive Status: Within Functional Limits for tasks assessed                      General Comments      Exercises        Assessment/Plan    PT Assessment Patient needs continued PT services  PT Diagnosis Difficulty walking;Acute pain   PT Problem List Decreased strength;Decreased range of motion;Decreased activity tolerance;Decreased mobility;Pain  PT Treatment Interventions DME instruction;Gait training;Stair training;Functional mobility training;Therapeutic activities;Therapeutic exercise;Balance  training;Patient/family education   PT Goals (Current goals can be found in the Care Plan section) Acute Rehab PT Goals Patient Stated Goal: to go home PT Goal Formulation: With patient Time For Goal Achievement: 01/15/15 Potential  to Achieve Goals: Good    Frequency Min 3X/week   Barriers to discharge        Co-evaluation               End of Session   Activity Tolerance: Patient tolerated treatment well Patient left: in chair;with call bell/phone within reach;with family/visitor present Nurse Communication: Mobility status         Time: 3846-6599 PT Time Calculation (min) (ACUTE ONLY): 24 min   Charges:   PT Evaluation $Initial PT Evaluation Tier I: 1 Procedure PT Treatments $Gait Training: 8-22 mins   PT G CodesDuncan Dull 2015/01/14, 2:57 PM Alben Deeds, [place] DPT  [phone]

## 2015-01-02 ENCOUNTER — Encounter (HOSPITAL_COMMUNITY): Payer: Self-pay | Admitting: General Surgery

## 2015-01-02 MED ORDER — TAMSULOSIN HCL 0.4 MG PO CAPS
0.4000 mg | ORAL_CAPSULE | Freq: Every day | ORAL | Status: DC
  Administered 2015-01-02 – 2015-01-06 (×5): 0.4 mg via ORAL
  Filled 2015-01-02 (×6): qty 1

## 2015-01-02 NOTE — Progress Notes (Signed)
2 Days Post-Op  Subjective: Pt doing well.  No complaints aside from some abd soreness  Objective: Vital signs in last 24 hours: Temp:  [97.9 F (36.6 C)-98.6 F (37 C)] 98.6 F (37 C) (04/28 0539) Pulse Rate:  [70-100] 87 (04/28 0539) Resp:  [14-19] 14 (04/28 0755) BP: (122-134)/(78-88) 133/87 mmHg (04/28 0539) SpO2:  [95 %-99 %] 95 % (04/28 0755) Last BM Date: 12/30/14  Intake/Output from previous day: 04/27 0701 - 04/28 0700 In: 2677.1 [I.V.:2677.1] Out: 1060 [Urine:850; Drains:210] Intake/Output this shift:    General appearance: alert and cooperative GI: wound/c/d/i, JPs SS, hypoactive BS,   Lab Results:   Recent Labs  01/01/15 0432  WBC 13.0*  HGB 14.8  HCT 44.8  PLT 189   BMET  Recent Labs  12/31/14 1043 01/01/15 0432  NA 141 136  K 5.0 4.7  CL 107 104  CO2 24 22  GLUCOSE 87 238*  BUN 14 24*  CREATININE 1.31 1.49*  CALCIUM 8.7 7.9*   PT/INR No results for input(s): LABPROT, INR in the last 72 hours. ABG No results for input(s): PHART, HCO3 in the last 72 hours.  Invalid input(s): PCO2, PO2  Studies/Results: No results found.  Anti-infectives: Anti-infectives    Start     Dose/Rate Route Frequency Ordered Stop   12/31/14 1100  ceFAZolin (ANCEF) IVPB 2 g/50 mL premix     2 g 100 mL/hr over 30 Minutes Intravenous On call to O.R. 12/30/14 1247 12/31/14 1135      Assessment/Plan: s/p Procedure(s): OPEN INCISIONAL HERNIA REPAIR WITH MESH (TAR PROCEDURE) (N/A) INSERTION OF MESH (N/A) cont foley for bladder outlet obstruction.  Add FLomax  Mobilize Likely start clears tomorrow   LOS: 2 days    [name]., Anne Hahn 01/02/2015

## 2015-01-02 NOTE — Progress Notes (Addendum)
Physical Therapy Treatment Patient Details Name: Johnathan Bauer MRN: [id number] DOB: September 17, 1950 Today's Date: 01-22-2015    History of Present Illness 64 year old male who previously underwent exploratory laparotomy with Hartmann's procedure on 09/02/2012, laparoscopic colostomy takedown and anastomosis on 06/06/2013 presents now for incisional hernia repair.     PT Comments    Patient progressing well with mobility, amble to ambulate increased distance this afternoon despite report of increased pain. Patient re-educated on transitional techniques to aid in pain management. Patient encouraged to continue ambulation with staff as able. Will continue to see and progress as tolerated.  Follow Up Recommendations  Supervision - Intermittent     Equipment Recommendations  None recommended by PT    Recommendations for Other Services       Precautions / Restrictions Precautions Precaution Comments: abdominal binder (2 jp drains)    Mobility  Bed Mobility          Transfers Overall transfer level: Needs assistance Equipment used: Rolling walker (2 wheeled) Transfers: Sit to/from Stand Sit to Stand: Min guard         General transfer comment: VCs for hand placement and positioning  Ambulation/Gait Ambulation/Gait assistance: Supervision Ambulation Distance (Feet): 400 Feet Assistive device:  (pushing IV pole) Gait Pattern/deviations: Step-through pattern;Decreased stride length;Trunk flexed Gait velocity: decreased Gait velocity interpretation: Below normal speed for age/gender General Gait Details: improved cadence this session (still decreased overall), able to ambulate with IV pole instead of RW.    Stairs            Wheelchair Mobility    Modified Rankin (Stroke Patients Only)       Balance                                    Cognition Arousal/Alertness: Awake/alert Behavior During Therapy: WFL for tasks assessed/performed Overall  Cognitive Status: Within Functional Limits for tasks assessed                      Exercises      General Comments        Pertinent Vitals/Pain Pain Assessment: 0-10 Pain Score: 4  Pain Location: abdominal pain  Pain Descriptors / Indicators: Discomfort;Grimacing Pain Intervention(s): Monitored during session;Repositioned;PCA encouraged    Home Living                      Prior Function            PT Goals (current goals can now be found in the care plan section) Acute Rehab PT Goals Patient Stated Goal: to go home PT Goal Formulation: With patient Time For Goal Achievement: 01/15/15 Potential to Achieve Goals: Good Progress towards PT goals: Progressing toward goals    Frequency  Min 3X/week    PT Plan Current plan remains appropriate    Co-evaluation             End of Session   Activity Tolerance: Patient tolerated treatment well Patient left: in chair;with call bell/phone within reach;with family/visitor present     Time: 8366-2947 PT Time Calculation (min) (ACUTE ONLY): 25 min  Charges:  $Gait Training: 8-22 mins $Therapeutic Activity: 8-22 mins                    G CodesDuncan Dull Jan 22, 2015, 4:36 PM Alben Deeds, [place] DPT  [phone]

## 2015-01-03 MED ORDER — ALPRAZOLAM 0.25 MG PO TABS
0.2500 mg | ORAL_TABLET | Freq: Three times a day (TID) | ORAL | Status: DC | PRN
  Administered 2015-01-03 – 2015-01-06 (×5): 0.25 mg via ORAL
  Filled 2015-01-03 (×5): qty 1

## 2015-01-03 MED ORDER — PANTOPRAZOLE SODIUM 40 MG PO TBEC
40.0000 mg | DELAYED_RELEASE_TABLET | Freq: Every day | ORAL | Status: DC
  Administered 2015-01-03 – 2015-01-06 (×4): 40 mg via ORAL
  Filled 2015-01-03 (×4): qty 1

## 2015-01-03 MED ORDER — GUAIFENESIN-DM 100-10 MG/5ML PO SYRP
5.0000 mL | ORAL_SOLUTION | ORAL | Status: DC | PRN
  Administered 2015-01-03 – 2015-01-06 (×5): 5 mL via ORAL
  Filled 2015-01-03 (×5): qty 5

## 2015-01-03 MED ORDER — HYDROMORPHONE HCL 1 MG/ML IJ SOLN: 1.0000 mg | INTRAMUSCULAR | Status: DC | PRN

## 2015-01-03 MED ORDER — OXYCODONE-ACETAMINOPHEN 5-325 MG PO TABS
1.0000 | ORAL_TABLET | ORAL | Status: DC | PRN
  Administered 2015-01-03 (×2): 2 via ORAL
  Filled 2015-01-03 (×2): qty 2

## 2015-01-03 NOTE — Progress Notes (Signed)
3 Days Post-Op  Subjective: Doing well.  Objective: Vital signs in last 24 hours: Temp:  [97.6 F (36.4 C)-98.8 F (37.1 C)] 97.8 F (36.6 C) (04/29 0609) Pulse Rate:  [76-88] 88 (04/29 0609) Resp:  [13-20] 19 (04/29 0609) BP: (123-152)/(78-112) 123/84 mmHg (04/29 0609) SpO2:  [91 %-100 %] 100 % (04/29 0609) Last BM Date: 12/30/14  Intake/Output from previous day: 04/28 0701 - 04/29 0700 In: 1100 [I.V.:1100] Out: 1225 [Urine:1025; Drains:200] Intake/Output this shift:    General appearance: alert and cooperative GI: soft, nd, approp ttp, active bs, JP ss  Lab Results:   Recent Labs  01/01/15 0432  WBC 13.0*  HGB 14.8  HCT 44.8  PLT 189   BMET  Recent Labs  12/31/14 1043 01/01/15 0432  NA 141 136  K 5.0 4.7  CL 107 104  CO2 24 22  GLUCOSE 87 238*  BUN 14 24*  CREATININE 1.31 1.49*  CALCIUM 8.7 7.9*   PT/INR No results for input(s): LABPROT, INR in the last 72 hours. ABG No results for input(s): PHART, HCO3 in the last 72 hours.  Invalid input(s): PCO2, PO2  Studies/Results: No results found.  Anti-infectives: Anti-infectives    Start     Dose/Rate Route Frequency Ordered Stop   12/31/14 1100  ceFAZolin (ANCEF) IVPB 2 g/50 mL premix     2 g 100 mL/hr over 30 Minutes Intravenous On call to O.R. 12/30/14 1247 12/31/14 1135      Assessment/Plan: s/p Procedure(s): OPEN INCISIONAL HERNIA REPAIR WITH MESH (TAR PROCEDURE) (N/A) INSERTION OF MESH (N/A) d/c foley Advance diet to full liq DC PCA Po pain Rx  LOS: 3 days    [name]., Anne Hahn 01/03/2015

## 2015-01-03 NOTE — Progress Notes (Signed)
Physical Therapy Treatment Patient Details Name: Johnathan Bauer MRN: [id number] DOB: 07/04/1951 Today's Date: Jan 04, 2015    History of Present Illness 64 year old male who previously underwent exploratory laparotomy with Hartmann's procedure on 09/02/2012, laparoscopic colostomy takedown and anastomosis on 06/06/2013 presents now for incisional hernia repair.     PT Comments    Patient independent with mobility, performed stair negotiation and demonstrates no further acute PT needs, will sign off.  Follow Up Recommendations  Supervision - Intermittent     Equipment Recommendations  None recommended by PT    Recommendations for Other Services       Precautions / Restrictions Precautions Precaution Comments: abdominal binder (2 jp drains) Restrictions Weight Bearing Restrictions: No    Mobility  Bed Mobility Overal bed mobility: Modified Independent                Transfers Overall transfer level: Modified independent                  Ambulation/Gait Ambulation/Gait assistance: Independent Ambulation Distance (Feet): 180 Feet Assistive device: None       General Gait Details: improved gait speed   Stairs Stairs: Yes Stairs assistance: Modified independent (Device/Increase time) Stair Management: Alternating pattern;One rail Right Number of Stairs: 12 General stair comments: no assist or cues  Wheelchair Mobility    Modified Rankin (Stroke Patients Only)       Balance                                    Cognition Arousal/Alertness: Awake/alert Behavior During Therapy: WFL for tasks assessed/performed Overall Cognitive Status: Within Functional Limits for tasks assessed                      Exercises      General Comments        Pertinent Vitals/Pain Pain Assessment: 0-10 Pain Score: 2  Pain Location: abdominal pain Pain Descriptors / Indicators: Discomfort Pain Intervention(s): Monitored during session     Home Living                      Prior Function            PT Goals (current goals can now be found in the care plan section) Acute Rehab PT Goals Patient Stated Goal: to go home PT Goal Formulation: With patient Time For Goal Achievement: 01/15/15 Potential to Achieve Goals: Good Progress towards PT goals: Goals met/education completed, patient discharged from PT    Frequency  Min 3X/week    PT Plan Current plan remains appropriate    Co-evaluation             End of Session   Activity Tolerance: Patient tolerated treatment well Patient left: in bed;with call bell/phone within reach;with family/visitor present     Time: 1521-1530 PT Time Calculation (min) (ACUTE ONLY): 9 min  Charges:  $Gait Training: 8-22 mins                    G CodesDuncan Dull 01-04-15, 4:30 PM Alben Deeds, [place] DPT  [phone]

## 2015-01-04 ENCOUNTER — Inpatient Hospital Stay (HOSPITAL_COMMUNITY): Payer: BLUE CROSS/BLUE SHIELD

## 2015-01-04 MED ORDER — HYDROCODONE-ACETAMINOPHEN 5-325 MG PO TABS
1.0000 | ORAL_TABLET | ORAL | Status: DC | PRN
  Administered 2015-01-04 – 2015-01-05 (×2): 2 via ORAL
  Administered 2015-01-06: 1 via ORAL
  Filled 2015-01-04 (×2): qty 2
  Filled 2015-01-04: qty 1

## 2015-01-04 MED ORDER — IPRATROPIUM-ALBUTEROL 0.5-2.5 (3) MG/3ML IN SOLN: 3.0000 mL | RESPIRATORY_TRACT | Status: DC | PRN

## 2015-01-04 MED ORDER — IPRATROPIUM-ALBUTEROL 0.5-2.5 (3) MG/3ML IN SOLN
3.0000 mL | RESPIRATORY_TRACT | Status: DC
  Administered 2015-01-04: 3 mL via RESPIRATORY_TRACT
  Filled 2015-01-04: qty 3

## 2015-01-04 MED ORDER — IPRATROPIUM-ALBUTEROL 0.5-2.5 (3) MG/3ML IN SOLN
3.0000 mL | Freq: Three times a day (TID) | RESPIRATORY_TRACT | Status: DC
  Administered 2015-01-05 – 2015-01-06 (×5): 3 mL via RESPIRATORY_TRACT
  Filled 2015-01-04 (×5): qty 3

## 2015-01-04 MED ORDER — ALBUTEROL SULFATE (2.5 MG/3ML) 0.083% IN NEBU
INHALATION_SOLUTION | RESPIRATORY_TRACT | Status: AC
  Administered 2015-01-04: 2.5 mg
  Filled 2015-01-04: qty 3

## 2015-01-04 NOTE — Progress Notes (Signed)
4 Days Post-Op  Subjective: Pt doing well. Some SOB last night.  Working on IS more often  Objective: Vital signs in last 24 hours: Temp:  [97.5 F (36.4 C)-98.6 F (37 C)] 98.6 F (37 C) (04/30 0602) Pulse Rate:  [85-90] 87 (04/30 0602) Resp:  [18-20] 18 (04/30 0602) BP: (119-151)/(84-93) 151/93 mmHg (04/30 0602) SpO2:  [95 %-100 %] 100 % (04/30 0602) Last BM Date: 12/30/14  Intake/Output from previous day: 04/29 0701 - 04/30 0700 In: 1820 [P.O.:720; I.V.:1100] Out: 910 [Urine:500; Drains:410] Intake/Output this shift:    General appearance: alert and cooperative GI: soft, approp ttp, JPs SS, skin viable   Anti-infectives: Anti-infectives    Start     Dose/Rate Route Frequency Ordered Stop   12/31/14 1100  ceFAZolin (ANCEF) IVPB 2 g/50 mL premix     2 g 100 mL/hr over 30 Minutes Intravenous On call to O.R. 12/30/14 1247 12/31/14 1135      Assessment/Plan: s/p Procedure(s): OPEN INCISIONAL HERNIA REPAIR WITH MESH (TAR PROCEDURE) (N/A) INSERTION OF MESH (N/A) Advance diet to fulls Will order flutter valve pcxr to eval lungs for SOB mobilize  LOS: 4 days    [name]., Anne Hahn 01/04/2015

## 2015-01-05 NOTE — Progress Notes (Signed)
5 Days Post-Op  Subjective: Patient continued with poor pulmonary toilet yesterday. He continues to ambulate well. Patient's pain well controlled.  Objective: Vital signs in last 24 hours: Temp:  [97.7 F (36.5 C)-98.1 F (36.7 C)] 97.7 F (36.5 C) (05/01 0547) Pulse Rate:  [86-98] 87 (05/01 0547) Resp:  [17-18] 17 (05/01 0547) BP: (126-151)/(81-84) 126/84 mmHg (05/01 0547) SpO2:  [97 %-100 %] 97 % (05/01 0547) Last BM Date: 12/30/14  Intake/Output from previous day: 04/30 0701 - 05/01 0700 In: -  Out: 840 [Urine:550; Drains:290] Intake/Output this shift: Total I/O In: -  Out: 360 [Urine:250; Drains:110]  General appearance: alert and cooperative GI: Soft, nondistended, active bowel sounds, JP drains serosanguineous. Midline skin viable  Studies/Results: Dg Chest Port 1 View  01/04/2015   CLINICAL DATA:  Shortness of breath. History of CAD, pneumonia, chronic atrial fibrillation.  EXAM: PORTABLE CHEST - 1 VIEW  COMPARISON:  01/17/2013  FINDINGS: The heart is enlarged. Shallow lung inflation. The left lung base is difficult to evaluate given the technique. However, I suspect mild streaky density in the retrocardiac region raising the question of atelectasis or early infiltrate. There is no pulmonary edema.  IMPRESSION: 1. Shallow inflation. 2. Cardiomegaly without pulmonary edema. 3. Suspect left lower lobe atelectasis or early infiltrate. Consider follow-up PA and lateral chest x-ray if the patient is able.   Electronically Signed   By: Nolon Nations M.D.   On: 01/04/2015 20:10    Anti-infectives: Anti-infectives    Start     Dose/Rate Route Frequency Ordered Stop   12/31/14 1100  ceFAZolin (ANCEF) IVPB 2 g/50 mL premix     2 g 100 mL/hr over 30 Minutes Intravenous On call to O.R. 12/30/14 1247 12/31/14 1135      Assessment/Plan: s/p Procedure(s): OPEN INCISIONAL HERNIA REPAIR WITH MESH (TAR PROCEDURE) (N/A) INSERTION OF MESH (N/A) Aggressive pulmonary toilet   Advance diet as tolerated Hopefully home the next 1-2 days.  LOS: 5 days    [name]., [facility] 01/05/2015

## 2015-01-05 NOTE — Progress Notes (Signed)
Patient's iv infiltrated, Probation officer notified MD. MD said it is ok not to restart a new one.

## 2015-01-06 MED ORDER — HYDROCODONE-ACETAMINOPHEN 5-325 MG PO TABS: 1.0000 | ORAL_TABLET | Freq: Four times a day (QID) | ORAL | Status: DC | PRN

## 2015-01-06 NOTE — Discharge Instructions (Signed)
[facility] _______[facility] ° °HERNIA REPAIR: POST OP INSTRUCTIONS ° °Always review your discharge instruction sheet given to you by the facility where your surgery was performed. °IF YOU HAVE DISABILITY OR FAMILY LEAVE FORMS, YOU MUST BRING THEM TO THE OFFICE FOR PROCESSING.   °DO NOT GIVE THEM TO YOUR DOCTOR. ° °1. A  prescription for pain medication may be given to you upon discharge.  Take your pain medication as prescribed, if needed.  If narcotic pain medicine is not needed, then you may take acetaminophen (Tylenol) or ibuprofen (Advil) as needed. °2. Take your usually prescribed medications unless otherwise directed. °3. If you need a refill on your pain medication, please contact your pharmacy.  They will contact our office to request authorization. Prescriptions will not be filled after 5 pm or on week-ends. °4. You should follow a light diet the first 24 hours after arrival home, such as soup and crackers, etc.  Be sure to include lots of fluids daily.  Resume your normal diet the day after surgery. °5. Most patients will experience some swelling and bruising around the umbilicus or in the groin and scrotum.  Ice packs and reclining will help.  Swelling and bruising can take several days to resolve.  °6. It is common to experience some constipation if taking pain medication after surgery.  Increasing fluid intake and taking a stool softener (such as Colace) will usually help or prevent this problem from occurring.  A mild laxative (Milk of Magnesia or Miralax) should be taken according to package directions if there are no bowel movements after 48 hours. °7. Unless discharge instructions indicate otherwise, you may remove your bandages 24-48 hours after surgery, and you may shower at that time.  You may have steri-strips (small skin tapes) in place directly over the incision.  These strips should be left on the skin for 7-10 days.  If your surgeon used skin glue on the incision, you may shower  in 24 hours.  The glue will flake off over the next 2-3 weeks.  Any sutures or staples will be removed at the office during your follow-up visit. °8. ACTIVITIES:  You may resume regular (light) daily activities beginning the next day--such as daily self-care, walking, climbing stairs--gradually increasing activities as tolerated.  You may have sexual intercourse when it is comfortable.  Refrain from any heavy lifting or straining until approved by your doctor. °a. You may drive when you are no longer taking prescription pain medication, you can comfortably wear a seatbelt, and you can safely maneuver your car and apply brakes. °b. RETURN TO WORK:  __________________________________________________________ °9. You should see your doctor in the office for a follow-up appointment approximately 2-3 weeks after your surgery.  Make sure that you call for this appointment within a day or two after you arrive home to insure a convenient appointment time. °10. OTHER INSTRUCTIONS:  __________________________________________________________________________________________________________________________________________________________________________________________  °WHEN TO CALL YOUR DOCTOR: °1. Fever over 101.0 °2. Inability to urinate °3. Nausea and/or vomiting °4. Extreme swelling or bruising °5. Continued bleeding from incision. °6. Increased pain, redness, or drainage from the incision ° °The clinic staff is available to answer your questions during regular business hours.  Please don’t hesitate to call and ask to speak to one of the nurses for clinical concerns.  If you have a medical emergency, go to the nearest emergency room or call 911.  A surgeon from [facility] is always on call at the hospital ° ° °[address], [address], [place], [place]  [address] ? ° [address] [address], [place], [place]   [address] °[phone] ? [phone] ? FAX [phone] °Web site: [email or web address] ° °

## 2015-01-06 NOTE — Discharge Summary (Signed)
Physician Discharge Summary  Patient ID: Johnathan Bauer MRN: [id number] DOB/AGE: 64/16/1952 64 y.o.  Admit date: 12/31/2014 Discharge date: 01/06/2015  Admission Diagnoses: s/p incisional hernia repair with mesh  Discharge Diagnoses:  Active Problems:   S/P hernia repair   Discharged Condition: good  Hospital Course: PT was admited post op.  He was started on a PCA.  He was transition to a clear liq diet.  He tol that well and was slowly adv to a reg diet.  He was also transitioned to PO pain meds.  He was ambulating well.  He did have some upper airway congestion, which required some aggressive Pulm toilet and breathing treatments.  His pulm status improved.  He was otherwise afebrile, tol PO, had good pain control and deemed stable for DC and DCd home.  Consults: None  Significant Diagnostic Studies: none  Treatments: surgery: as above  Discharge Exam: Blood pressure 144/96, pulse 84, temperature 98.2 F (36.8 C), temperature source Oral, resp. rate 17, height 6' (1.829 m), weight 79.379 kg (175 lb), SpO2 97 %. General appearance: alert and cooperative GI: soft, non-tender; bowel sounds normal; no masses,  no organomegaly and wound c/d/i, skin viable, JP removed  Disposition: 01-Home or Self Care  Discharge Instructions    Diet - low sodium heart healthy    Complete by:  As directed      Increase activity slowly    Complete by:  As directed             Medication List    STOP taking these medications        oxyCODONE-acetaminophen 10-325 MG per tablet  Commonly known as:  PERCOCET      TAKE these medications        aspirin EC 81 MG tablet  Take 81 mg by mouth daily.     digoxin 0.25 MG tablet  Commonly known as:  LANOXIN  Take 0.25 mg by mouth daily.     HYDROcodone-acetaminophen 5-325 MG per tablet  Commonly known as:  NORCO/VICODIN  Take 1 tablet by mouth every 6 (six) hours as needed for moderate pain.     metoprolol 50 MG tablet  Commonly known as:   LOPRESSOR  Take 50 mg by mouth daily.           Follow-up Information    Follow up with Reyes Ivan, MD. Schedule an appointment as soon as possible for a visit in 2 weeks.   Specialty:  General Surgery   Why:  For wound re-check   Contact information:   [address] [address] [phone]       Signed: [name]., Anne Hahn 01/06/2015, 12:29 PM

## 2015-01-06 NOTE — Progress Notes (Signed)
6 Days Post-Op  Subjective: Doing well.     Objective: Vital signs in last 24 hours: Temp:  [98.2 F (36.8 C)-98.3 F (36.8 C)] 98.2 F (36.8 C) (05/02 0505) Pulse Rate:  [73-84] 84 (05/02 0505) Resp:  [17-18] 17 (05/02 0505) BP: (140-150)/(40-96) 144/96 mmHg (05/02 0505) SpO2:  [97 %-100 %] 97 % (05/02 0834) Last BM Date: 01/06/15  Intake/Output from previous day: 05/01 0701 - 05/02 0700 In: 690 [P.O.:600] Out: 126 [Drains:125; Stool:1] Intake/Output this shift: Total I/O In: 120 [P.O.:120] Out: 380 [Urine:350; Drains:30]  General appearance: alert and cooperative GI: soft, non-tender; bowel sounds normal; no masses,  no organomegaly and JP SS  Lab Results:  No results for input(s): WBC, HGB, HCT, PLT in the last 72 hours. BMET No results for input(s): NA, K, CL, CO2, GLUCOSE, BUN, CREATININE, CALCIUM in the last 72 hours. PT/INR No results for input(s): LABPROT, INR in the last 72 hours. ABG No results for input(s): PHART, HCO3 in the last 72 hours.  Invalid input(s): PCO2, PO2  Studies/Results: Dg Chest Port 1 View  01/04/2015   CLINICAL DATA:  Shortness of breath. History of CAD, pneumonia, chronic atrial fibrillation.  EXAM: PORTABLE CHEST - 1 VIEW  COMPARISON:  01/17/2013  FINDINGS: The heart is enlarged. Shallow lung inflation. The left lung base is difficult to evaluate given the technique. However, I suspect mild streaky density in the retrocardiac region raising the question of atelectasis or early infiltrate. There is no pulmonary edema.  IMPRESSION: 1. Shallow inflation. 2. Cardiomegaly without pulmonary edema. 3. Suspect left lower lobe atelectasis or early infiltrate. Consider follow-up PA and lateral chest x-ray if the patient is able.   Electronically Signed   By: Nolon Nations M.D.   On: 01/04/2015 20:10    Anti-infectives: Anti-infectives    Start     Dose/Rate Route Frequency Ordered Stop   12/31/14 1100  ceFAZolin (ANCEF) IVPB 2 g/50 mL premix      2 g 100 mL/hr over 30 Minutes Intravenous On call to O.R. 12/30/14 1247 12/31/14 1135      Assessment/Plan: s/p Procedure(s): OPEN INCISIONAL HERNIA REPAIR WITH MESH (TAR PROCEDURE) (N/A) INSERTION OF MESH (N/A) Discharge  LOS: 6 days    [name]., [facility] 01/06/2015

## 2015-01-06 NOTE — Progress Notes (Signed)
Discharge instructions reviewed with patient and patient's wife at the bedside. Rx given, reminded patient of the follow up appointment importance, care of drain sites and signs/symptoms of infection reviewed. All discharge instructions complete. Pt ready for discharge.

## 2015-01-08 ENCOUNTER — Encounter (HOSPITAL_COMMUNITY): Payer: Self-pay | Admitting: General Surgery

## 2015-05-20 DIAGNOSIS — I10 Essential (primary) hypertension: Secondary | ICD-10-CM | POA: Insufficient documentation

## 2015-05-20 DIAGNOSIS — I2510 Atherosclerotic heart disease of native coronary artery without angina pectoris: Secondary | ICD-10-CM

## 2015-05-20 HISTORY — DX: Essential (primary) hypertension: I10

## 2015-05-20 HISTORY — DX: Atherosclerotic heart disease of native coronary artery without angina pectoris: I25.10

## 2015-08-21 ENCOUNTER — Ambulatory Visit (INDEPENDENT_AMBULATORY_CARE_PROVIDER_SITE_OTHER): Payer: BLUE CROSS/BLUE SHIELD | Admitting: Sports Medicine

## 2015-08-21 ENCOUNTER — Encounter: Payer: Self-pay | Admitting: Sports Medicine

## 2015-08-21 DIAGNOSIS — B351 Tinea unguium: Secondary | ICD-10-CM | POA: Diagnosis not present

## 2015-08-21 NOTE — Progress Notes (Deleted)
   Subjective:    Patient ID: Johnathan Bauer, male    DOB: March 24, 1951, 64 y.o.   MRN: [id number]  HPI    Review of Systems  Cardiovascular:       A rythmic aorta   All other systems reviewed and are negative.      Objective:   Physical Exam        Assessment & Plan:

## 2015-08-21 NOTE — Patient Instructions (Signed)
Preventing Toenail Fungus from Recurring/Worsening   Sanitize your shoes with Mycomist spray or a similar shoe sanitizer spray.  Follow the instructions on the bottle and dry them outside in the sun or with a hairdryer.  We also recommend repeating the sanitization once weekly in shoes you wear most often.   Throw away any shoes you have worn a significant amount without socks-fungus thrives in a warm moist environment and you want to avoid re-infection after your laser procedure   Bleach your socks with regular or color safe bleach   Change your socks regularly to keep your feet clean and dry (especially if you have sweaty feet)-if sweaty feet are a problem, let your doctor know-there is a great lotion that helps with this problem.   Clean your toenail clippers with alcohol before you use them if you do your own toenails and make sure to replace Emory boards and orange sticks regularly   If you get regular pedicures, bring your own instruments or go to a spa that sterilizes their instruments in an autoclave.

## 2015-08-21 NOTE — Progress Notes (Signed)
Patient ID: Johnathan Bauer, male   DOB: 1951/07/28, 64 y.o.   MRN: [id number] Subjective: Johnathan Bauer is a 64 y.o. male patient seen today in office with complaint of thickened and discolored toenails concerning for fungus; wants to discuss treatment options. Admits to hx of athletes foot in teens. Reports nails have been like this for a while. Patient denies history of Diabetes, Neuropathy, or Vascular disease. Patient has no other pedal complaints at this time.   Review of Systems  Cardiovascular:       A rythmic aorta   All other systems reviewed and are negative.  Patient Active Problem List   Diagnosis Date Noted  . S/P hernia repair 12/31/2014  . Alcohol dependence ([place]) 09/19/2012  . Respiratory failure, post-operative ([place]) 09/07/2012  . NSVT, 7 beat run 1/02 09/07/2012  . Atrial fibrillation ([place]) 09/03/2012  . 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 Perforated sigmoid colon ([place]) 09/03/2012  . Diverticulitis of sigmoid colon 09/02/2012   Current Outpatient Prescriptions on File Prior to Visit  Medication Sig Dispense Refill  . aspirin EC 81 MG tablet Take 81 mg by mouth daily.    . metoprolol (LOPRESSOR) 50 MG tablet Take 50 mg by mouth daily.  5   No current facility-administered medications on file prior to visit.   Allergies  Allergen Reactions  . Codeine Nausea And Vomiting  . Poison Ivy Extract [employer] Poison Ivy] Hives   Objective: Physical Exam  General: Well developed, nourished, no acute distress, awake, alert and oriented x 3  Vascular: Dorsalis pedis artery 2/4 bilateral, Posterior tibial artery 2/4 bilateral, skin temperature warm to warm proximal to distal bilateral lower extremities, no varicosities, pedal hair present bilateral.  Neurological: Gross sensation present via light touch bilateral.   Dermatological: Skin is warm, dry, and supple bilateral, Nails 1-10 are mildly elongated,thick, and discolored with moderate  subungal debris, no webspace macerations present bilateral, no open lesions present bilateral, no callus/corns/hyperkeratotic tissue present bilateral. No signs of infection bilateral.  Musculoskeletal: No gross boney deformities noted bilateral. Muscular strength within normal limits without pain or limitation on range of motion. No pain with calf compression bilateral.  Assessment and Plan:  Problem List Items Addressed This Visit    None    Visit Diagnoses    Dermatophytosis of nail    -  Primary    probable, nails 1-10       -Examined patient.  -Discussed treatment options for mycotic nails. All questions answered -Patient elected to continue with OTC topical antifungal and self debridements for now -Gave patient infomation on laser nail treatment to review -As a curtesy, Mechanically debrided and reduced mycotic nails with sterile nail nipper and dremel nail file without incident. -Patient to return as needed for follow up evaluation or sooner if symptoms worsen.  Landis Martins, DPM

## 2015-11-27 ENCOUNTER — Encounter: Payer: Self-pay | Admitting: Gastroenterology

## 2016-04-06 DIAGNOSIS — I639 Cerebral infarction, unspecified: Secondary | ICD-10-CM | POA: Insufficient documentation

## 2016-04-06 HISTORY — DX: Cerebral infarction, unspecified: I63.9

## 2016-05-12 DIAGNOSIS — M272 Inflammatory conditions of jaws: Secondary | ICD-10-CM | POA: Diagnosis not present

## 2016-05-17 DIAGNOSIS — I4891 Unspecified atrial fibrillation: Secondary | ICD-10-CM | POA: Diagnosis not present

## 2016-05-17 DIAGNOSIS — I10 Essential (primary) hypertension: Secondary | ICD-10-CM | POA: Diagnosis not present

## 2016-05-17 DIAGNOSIS — F17210 Nicotine dependence, cigarettes, uncomplicated: Secondary | ICD-10-CM | POA: Diagnosis not present

## 2016-05-17 DIAGNOSIS — I6310 Cerebral infarction due to embolism of unspecified precerebral artery: Secondary | ICD-10-CM | POA: Diagnosis not present

## 2016-05-17 DIAGNOSIS — R29818 Other symptoms and signs involving the nervous system: Secondary | ICD-10-CM | POA: Diagnosis not present

## 2016-05-17 DIAGNOSIS — R29810 Facial weakness: Secondary | ICD-10-CM | POA: Diagnosis not present

## 2016-05-17 DIAGNOSIS — R531 Weakness: Secondary | ICD-10-CM | POA: Diagnosis not present

## 2016-05-17 DIAGNOSIS — I6789 Other cerebrovascular disease: Secondary | ICD-10-CM | POA: Diagnosis not present

## 2016-05-17 DIAGNOSIS — I639 Cerebral infarction, unspecified: Secondary | ICD-10-CM | POA: Diagnosis not present

## 2016-05-17 DIAGNOSIS — I2510 Atherosclerotic heart disease of native coronary artery without angina pectoris: Secondary | ICD-10-CM | POA: Diagnosis not present

## 2016-05-17 DIAGNOSIS — Z955 Presence of coronary angioplasty implant and graft: Secondary | ICD-10-CM | POA: Diagnosis not present

## 2016-05-17 DIAGNOSIS — R410 Disorientation, unspecified: Secondary | ICD-10-CM | POA: Diagnosis not present

## 2016-05-17 DIAGNOSIS — Z7952 Long term (current) use of systemic steroids: Secondary | ICD-10-CM | POA: Diagnosis not present

## 2016-05-17 DIAGNOSIS — Z7982 Long term (current) use of aspirin: Secondary | ICD-10-CM | POA: Diagnosis not present

## 2016-05-17 DIAGNOSIS — Z79899 Other long term (current) drug therapy: Secondary | ICD-10-CM | POA: Diagnosis not present

## 2016-05-17 DIAGNOSIS — I6523 Occlusion and stenosis of bilateral carotid arteries: Secondary | ICD-10-CM | POA: Diagnosis not present

## 2016-05-17 DIAGNOSIS — I482 Chronic atrial fibrillation: Secondary | ICD-10-CM | POA: Diagnosis not present

## 2016-05-21 DIAGNOSIS — R451 Restlessness and agitation: Secondary | ICD-10-CM | POA: Diagnosis not present

## 2016-05-21 DIAGNOSIS — I482 Chronic atrial fibrillation: Secondary | ICD-10-CM | POA: Diagnosis not present

## 2016-05-21 DIAGNOSIS — I639 Cerebral infarction, unspecified: Secondary | ICD-10-CM | POA: Diagnosis not present

## 2016-05-21 DIAGNOSIS — I10 Essential (primary) hypertension: Secondary | ICD-10-CM | POA: Diagnosis not present

## 2016-05-25 DIAGNOSIS — F1010 Alcohol abuse, uncomplicated: Secondary | ICD-10-CM

## 2016-05-25 DIAGNOSIS — I482 Chronic atrial fibrillation: Secondary | ICD-10-CM | POA: Diagnosis not present

## 2016-05-25 DIAGNOSIS — Z6822 Body mass index (BMI) 22.0-22.9, adult: Secondary | ICD-10-CM | POA: Diagnosis not present

## 2016-05-25 DIAGNOSIS — I10 Essential (primary) hypertension: Secondary | ICD-10-CM | POA: Diagnosis not present

## 2016-05-25 DIAGNOSIS — I6990 Unspecified sequelae of unspecified cerebrovascular disease: Secondary | ICD-10-CM

## 2016-05-25 DIAGNOSIS — I2510 Atherosclerotic heart disease of native coronary artery without angina pectoris: Secondary | ICD-10-CM | POA: Diagnosis not present

## 2016-05-25 HISTORY — DX: Alcohol abuse, uncomplicated: F10.10

## 2016-05-25 HISTORY — DX: Unspecified sequelae of unspecified cerebrovascular disease: I69.90

## 2016-06-03 DIAGNOSIS — I10 Essential (primary) hypertension: Secondary | ICD-10-CM | POA: Diagnosis not present

## 2016-06-09 DIAGNOSIS — H40003 Preglaucoma, unspecified, bilateral: Secondary | ICD-10-CM | POA: Diagnosis not present

## 2016-06-30 DIAGNOSIS — F1010 Alcohol abuse, uncomplicated: Secondary | ICD-10-CM | POA: Diagnosis not present

## 2016-06-30 DIAGNOSIS — I482 Chronic atrial fibrillation: Secondary | ICD-10-CM | POA: Diagnosis not present

## 2016-06-30 DIAGNOSIS — I10 Essential (primary) hypertension: Secondary | ICD-10-CM | POA: Diagnosis not present

## 2016-06-30 DIAGNOSIS — I2510 Atherosclerotic heart disease of native coronary artery without angina pectoris: Secondary | ICD-10-CM | POA: Diagnosis not present

## 2016-06-30 DIAGNOSIS — Z6822 Body mass index (BMI) 22.0-22.9, adult: Secondary | ICD-10-CM | POA: Diagnosis not present

## 2016-06-30 DIAGNOSIS — I6990 Unspecified sequelae of unspecified cerebrovascular disease: Secondary | ICD-10-CM | POA: Diagnosis not present

## 2016-11-10 DIAGNOSIS — J209 Acute bronchitis, unspecified: Secondary | ICD-10-CM | POA: Diagnosis not present

## 2016-11-10 DIAGNOSIS — B351 Tinea unguium: Secondary | ICD-10-CM | POA: Diagnosis not present

## 2016-11-18 DIAGNOSIS — B351 Tinea unguium: Secondary | ICD-10-CM | POA: Insufficient documentation

## 2016-11-19 ENCOUNTER — Ambulatory Visit: Payer: Managed Care, Other (non HMO) | Admitting: Gastroenterology

## 2016-11-30 ENCOUNTER — Encounter: Payer: Self-pay | Admitting: Gastroenterology

## 2016-11-30 ENCOUNTER — Telehealth: Payer: Self-pay

## 2016-11-30 ENCOUNTER — Ambulatory Visit (INDEPENDENT_AMBULATORY_CARE_PROVIDER_SITE_OTHER): Payer: PPO | Admitting: Gastroenterology

## 2016-11-30 VITALS — BP 96/86 | HR 72 | Ht 69.0 in | Wt 161.4 lb

## 2016-11-30 DIAGNOSIS — I480 Paroxysmal atrial fibrillation: Secondary | ICD-10-CM | POA: Diagnosis not present

## 2016-11-30 DIAGNOSIS — Z860100 Personal history of colon polyps, unspecified: Secondary | ICD-10-CM

## 2016-11-30 DIAGNOSIS — Z7901 Long term (current) use of anticoagulants: Secondary | ICD-10-CM

## 2016-11-30 HISTORY — DX: Long term (current) use of anticoagulants: Z79.01

## 2016-11-30 HISTORY — DX: Personal history of colonic polyps: Z86.010

## 2016-11-30 HISTORY — DX: Personal history of colon polyps, unspecified: Z86.0100

## 2016-11-30 MED ORDER — NA SULFATE-K SULFATE-MG SULF 17.5-3.13-1.6 GM/177ML PO SOLN: ORAL | 0 refills | Status: DC

## 2016-11-30 NOTE — Telephone Encounter (Signed)
Johnathan Bauer 03-02-1951 [id number]  Dear Dr Halford Chessman:  We have scheduled the above named patient for a(n) colonoscopy procedure. Our records show that (s)he is on anticoagulation therapy.  Please advise as to whether the patient may come off their therapy of Xeralto 2 days prior to their procedure which is scheduled for 01/26/2017.  Please route your response to Christian Mate, RN or fax response to [phone].  Sincerely,    [facility]

## 2016-11-30 NOTE — Patient Instructions (Addendum)
We have sent the following medications to your pharmacy for you to pick up at your convenience:Suprep  You have been scheduled for a colonoscopy. Please follow written instructions given to you at your visit today.  Please pick up your prep supplies at the pharmacy within the next 1-3 days. If you use inhalers (even only as needed), please bring them with you on the day of your procedure. Your physician has requested that you go to www.startemmi.com and enter the access code given to you at your visit today. This web site gives a general overview about your procedure. However, you should still follow specific instructions given to you by our office regarding your preparation for the procedure.  STOP YOUR XERALTO 2 DAYS PRIOR TO COLONOSCOPY.  We have faxed a letter to your cardiologist as well.

## 2016-11-30 NOTE — Progress Notes (Signed)
11/30/2016 Johnathan Bauer [id number] 1951-01-08   HISTORY OF PRESENT ILLNESS:  This is a 66 year old male who is known to Dr. Ardis Hughs.  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 His lactate was 7.8. He was given a bolus of heparin and transferred to [facility]. His pain improved with narcotics. The patient was admitted and underwent an Ex lap with Dr. Trula Slade and Dr. Rosendo Gros. Intraoperatively the SMA was evaluated and no mesentery ischemia was noted therefore no interventions were needed to be made by vascular surgery. Extensive diverticular disease and a perforated sigmoid colon was found sigmoid colon resection, end colostomy with Hartmans was performed. He had prolonged hosp stay and short time at rehab after that.  He then underwent colonoscopy with Dr. Ardis Hughs via his ostomy and his anus on 11/06/2012. At that time he had 3 polyps that were removed and were tubular adenomas. It was recommended he have a repeat colonoscopy in 3 years from that time. In October 2014 he had a laparoscopic ostomy takedown and anastomosis. In April 2016 he had an open incisional hernia repair with mesh. He returns today to schedule his colonoscopy.  He has atrial fibrillation and had a CVA in August 2017. Is on Xarelto per his cardiologist Dr. Agustin Cree.    Past Medical History:  Diagnosis Date  . Arthritis    "touch in my right thumb" (12/31/2014)  . Chronic atrial fibrillation (HCC)    did not like coumadin  . Chronic kidney disease 09/2012  . Coronary artery disease    s/p PCI Sept 2013 with DES OM  . Diverticulitis with perforation   . Pneumonia 09/2012  . Stroke [facility]) 04/2016   Past Surgical History:  Procedure Laterality Date  . COLON SURGERY    . COLOSTOMY REVERSAL  06/06/2013   Dr Rosendo Gros  . COLOSTOMY REVISION  09/02/2012   Procedure: COLON RESECTION SIGMOID;  Surgeon: Serafina Mitchell, MD;  Location: [facility] OR;  Service:  Vascular;  Laterality: N/A;  Sigmoid resection with End Colostomy.  . COLOSTOMY TAKEDOWN N/A 06/06/2013   Procedure: LAPAROSCOPIC OSTOMY TAKEDOWN AND ANASTOMOSIS ;  Surgeon: Ralene Ok, MD;  Location: [place];  Service: General;  Laterality: N/A;  . CORONARY ANGIOPLASTY WITH STENT PLACEMENT  06/2012   "1"  . HERNIA REPAIR    . INCISIONAL HERNIA REPAIR  12/31/2014   w/mesh  . INCISIONAL HERNIA REPAIR N/A 12/31/2014   Procedure: OPEN INCISIONAL HERNIA REPAIR WITH MESH (TAR PROCEDURE);  Surgeon: Ralene Ok, MD;  Location: [facility];  Service: General;  Laterality: N/A;  . INSERTION OF MESH N/A 12/31/2014   Procedure: INSERTION OF MESH;  Surgeon: Ralene Ok, MD;  Location: [facility];  Service: General;  Laterality: N/A;  . LAPAROTOMY  09/02/2012   Procedure: EXPLORATORY LAPAROTOMY;  Surgeon: Serafina Mitchell, MD;  Location: [facility] OR;  Service: Vascular;  Laterality: N/A;  Exploratory Laparotomy with Sigmoid resection with end colocstomy.  . TONSILLECTOMY      reports that he has quit smoking. His smoking use included Cigarettes. He has a 11.25 pack-year smoking history. He has never used smokeless tobacco. He reports that he does not drink alcohol or use drugs. family history includes Lymphoma in his father; Prostate cancer in his paternal grandfather. Allergies  Allergen Reactions  . Codeine Nausea And Vomiting  . Poison Ivy Extract [Poison Ivy Extract] Hives      Outpatient  Encounter Prescriptions as of 11/30/2016  Medication Sig  . digoxin (LANOXIN) 0.25 MG tablet TAKE 1 TABLET (250 MCG TOTAL) BY MOUTH DAILY.  . metoprolol (LOPRESSOR) 50 MG tablet Take 50 mg by mouth daily.  . nitroGLYCERIN (NITROSTAT) 0.4 MG SL tablet Place 0.4 mg under the tongue.  Marland Kitchen PROAIR HFA 108 (90 BASE) MCG/ACT inhaler INHALE 2 PUFFS EVERY 4 HOURS AS NEEDED FOR COUGH OR WHEEZE  . XARELTO 20 MG TABS tablet TAKE 1 TABLET (20 MG TOTAL) BY MOUTH DAILY WITH EVENING MEAL.  . [DISCONTINUED] aspirin EC 81 MG tablet Take 81  mg by mouth daily.  . [DISCONTINUED] digoxin (LANOXIN) 0.125 MG tablet Take by mouth.  . [DISCONTINUED] predniSONE (DELTASONE) 20 MG tablet TAKE 3 TABS X 2DAYS, 2.5 TABS X 2DAYS, 2 TABS X2DAYS, 1.5 TAB X 2DAYS 1 TAB X2DAYS, THEN 1/2 TAB X 2   No facility-administered encounter medications on file as of 11/30/2016.      REVIEW OF SYSTEMS  : All other systems reviewed and negative except where noted in the History of Present Illness.   PHYSICAL EXAM: Ht 5\' 9"  (1.753 m)   Wt 161 lb 6 oz (73.2 kg)   BMI 23.83 kg/m  General: Well developed white male in no acute distress Head: Normocephalic and atraumatic Eyes:  Sclerae anicteric, conjunctiva pink. Ears: Normal auditory acuity Lungs: Clear throughout to auscultation Heart: Regular rate and rhythm Abdomen: Soft, non-distended.  BS present.  Non-tender.  Scars noted from previous surgeries/ostomy. Rectal:  Will be done at the time of surgery. Musculoskeletal: Symmetrical with no gross deformities  Skin: No lesions on visible extremities Extremities: No edema  Neurological: Alert oriented x 4, grossly non-focal Psychological:  Alert and cooperative. Normal mood and affect  ASSESSMENT AND PLAN: -Personal history of colon polyps:  Tubular adenoma x 3 on colonoscopy in 11/2012.  Was due for recall 11/2015.  Will schedule with Dr. Ardis Hughs. -Chronic anticoagulation with Xarelto due to atrial fibrillation and CVA in 04/2016:  Will hold Xarelto for 2 days prior to endoscopic procedures - will instruct when and how to resume after procedure. Benefits and risks of procedure explained including risks of bleeding, perforation, infection, missed lesions, reactions to medications and possible need for hospitalization and surgery for complications. Additional rare but real risk of stroke or other vascular clotting events off of Xarelto also explained and need to seek urgent help if any signs of these problems occur. Will communicate by phone or EMR with  patient's prescribing provider, Dr. Agustin Cree, to confirm that holding Xarelto is reasonable in this case.    CC:  Enid Skeens., MD

## 2016-11-30 NOTE — Progress Notes (Signed)
I agree with the above note, plan 

## 2016-12-01 DIAGNOSIS — I2510 Atherosclerotic heart disease of native coronary artery without angina pectoris: Secondary | ICD-10-CM | POA: Diagnosis not present

## 2016-12-01 DIAGNOSIS — Z6822 Body mass index (BMI) 22.0-22.9, adult: Secondary | ICD-10-CM | POA: Diagnosis not present

## 2016-12-01 DIAGNOSIS — I10 Essential (primary) hypertension: Secondary | ICD-10-CM | POA: Diagnosis not present

## 2016-12-01 DIAGNOSIS — I482 Chronic atrial fibrillation: Secondary | ICD-10-CM | POA: Diagnosis not present

## 2016-12-01 DIAGNOSIS — L821 Other seborrheic keratosis: Secondary | ICD-10-CM | POA: Diagnosis not present

## 2016-12-01 DIAGNOSIS — R233 Spontaneous ecchymoses: Secondary | ICD-10-CM | POA: Diagnosis not present

## 2016-12-01 DIAGNOSIS — I6990 Unspecified sequelae of unspecified cerebrovascular disease: Secondary | ICD-10-CM | POA: Diagnosis not present

## 2016-12-01 DIAGNOSIS — L570 Actinic keratosis: Secondary | ICD-10-CM | POA: Diagnosis not present

## 2016-12-08 DIAGNOSIS — H40003 Preglaucoma, unspecified, bilateral: Secondary | ICD-10-CM | POA: Diagnosis not present

## 2016-12-10 DIAGNOSIS — I2510 Atherosclerotic heart disease of native coronary artery without angina pectoris: Secondary | ICD-10-CM | POA: Diagnosis not present

## 2016-12-10 DIAGNOSIS — I482 Chronic atrial fibrillation: Secondary | ICD-10-CM | POA: Diagnosis not present

## 2016-12-10 DIAGNOSIS — I6990 Unspecified sequelae of unspecified cerebrovascular disease: Secondary | ICD-10-CM | POA: Diagnosis not present

## 2016-12-10 DIAGNOSIS — I10 Essential (primary) hypertension: Secondary | ICD-10-CM | POA: Diagnosis not present

## 2017-01-24 ENCOUNTER — Telehealth: Payer: Self-pay | Admitting: Gastroenterology

## 2017-01-24 NOTE — Telephone Encounter (Signed)
Pt has been rescheduled to 03/15/17 he was re instructed and will call with any further questions or concerns.  He will hold xarelto 2 days prior to the appt.  Pt verbalized understanding

## 2017-01-26 ENCOUNTER — Encounter: Payer: PPO | Admitting: Gastroenterology

## 2017-02-07 DIAGNOSIS — J209 Acute bronchitis, unspecified: Secondary | ICD-10-CM | POA: Diagnosis not present

## 2017-03-14 ENCOUNTER — Telehealth: Payer: Self-pay | Admitting: Gastroenterology

## 2017-03-14 NOTE — Telephone Encounter (Signed)
Should be safe but no more xarelto for now.

## 2017-03-14 NOTE — Telephone Encounter (Signed)
Pt took Xarelto yesterday morning at 7:30am.  Telephone note from 01/24/17 says he should hold Xarelto 2 days prior to procedure.  Can he still proceed with procedure tomorrow? He is scheduled for 3:30pm.

## 2017-03-14 NOTE — Telephone Encounter (Signed)
Spoke with patient and reviewed his instructions for procedure tomorrow.  Instructed to not take any additional Xarelto.  Pt verbalized understanding.

## 2017-03-15 ENCOUNTER — Encounter: Payer: Self-pay | Admitting: Gastroenterology

## 2017-03-15 ENCOUNTER — Ambulatory Visit (AMBULATORY_SURGERY_CENTER): Payer: PPO | Admitting: Gastroenterology

## 2017-03-15 VITALS — BP 122/78 | HR 78 | Temp 98.9°F | Resp 18 | Ht 69.0 in | Wt 161.0 lb

## 2017-03-15 DIAGNOSIS — K5730 Diverticulosis of large intestine without perforation or abscess without bleeding: Secondary | ICD-10-CM

## 2017-03-15 DIAGNOSIS — Z86010 Personal history of colonic polyps: Secondary | ICD-10-CM

## 2017-03-15 DIAGNOSIS — D123 Benign neoplasm of transverse colon: Secondary | ICD-10-CM

## 2017-03-15 DIAGNOSIS — K6389 Other specified diseases of intestine: Secondary | ICD-10-CM | POA: Diagnosis not present

## 2017-03-15 DIAGNOSIS — D122 Benign neoplasm of ascending colon: Secondary | ICD-10-CM

## 2017-03-15 DIAGNOSIS — Z860101 Personal history of adenomatous and serrated colon polyps: Secondary | ICD-10-CM

## 2017-03-15 MED ORDER — SODIUM CHLORIDE 0.9 % IV SOLN: 500.0000 mL | INTRAVENOUS | Status: DC

## 2017-03-15 NOTE — Progress Notes (Signed)
Called to room to assist during endoscopic procedure.  Patient ID and intended procedure confirmed with present staff. Received instructions for my participation in the procedure from the performing physician.  

## 2017-03-15 NOTE — Patient Instructions (Signed)
YOU HAD AN ENDOSCOPIC PROCEDURE TODAY AT [facility]:   Refer to the procedure report that was given to you for any specific questions about what was found during the examination.  If the procedure report does not answer your questions, please call your gastroenterologist to clarify.  If you requested that your care partner not be given the details of your procedure findings, then the procedure report has been included in a sealed envelope for you to review at your convenience later.  YOU SHOULD EXPECT: Some feelings of bloating in the abdomen. Passage of more gas than usual.  Walking can help get rid of the air that was put into your GI tract during the procedure and reduce the bloating. If you had a lower endoscopy (such as a colonoscopy or flexible sigmoidoscopy) you may notice spotting of blood in your stool or on the toilet paper. If you underwent a bowel prep for your procedure, you may not have a normal bowel movement for a few days.  Please Note:  You might notice some irritation and congestion in your nose or some drainage.  This is from the oxygen used during your procedure.  There is no need for concern and it should clear up in a day or so.  SYMPTOMS TO REPORT IMMEDIATELY:   Following lower endoscopy (colonoscopy or flexible sigmoidoscopy):  Excessive amounts of blood in the stool  Significant tenderness or worsening of abdominal pains  Swelling of the abdomen that is new, acute  Fever of 100F or higher  For urgent or emergent issues, a gastroenterologist can be reached at any hour by calling [phone].   DIET:  We do recommend a small meal at first, but then you may proceed to your regular diet.  Drink plenty of fluids but you should avoid alcoholic beverages for 24 hours.  ACTIVITY:  You should plan to take it easy for the rest of today and you should NOT DRIVE or use heavy machinery until tomorrow (because of the sedation medicines used during the test).     FOLLOW UP: Our staff will call the number listed on your records the next business day following your procedure to check on you and address any questions or concerns that you may have regarding the information given to you following your procedure. If we do not reach you, we will leave a message.  However, if you are feeling well and you are not experiencing any problems, there is no need to return our call.  We will assume that you have returned to your regular daily activities without incident.  If any biopsies were taken you will be contacted by phone or by letter within the next 1-3 weeks.  Please call us at [phone] if you have not heard about the biopsies in 3 weeks.    SIGNATURES/CONFIDENTIALITY: You and/or your care partner have signed paperwork which will be entered into your electronic medical record.  These signatures attest to the fact that that the information above on your After Visit Summary has been reviewed and is understood.  Full responsibility of the confidentiality of this discharge information lies with you and/or your care-partner.  You may restart your xarelto tomorrow per Dr. Ardis Hughs.

## 2017-03-15 NOTE — Op Note (Signed)
[place] Patient Name: Johnathan Bauer Procedure Date: 03/15/2017 1:49 PM MRN: [id number] Endoscopist: Milus Banister , MD Age: 66 Referring MD:  Date of Birth: 10-28-1950 Gender: Male Account #: [id number] Procedure:                Colonoscopy Indications:              High risk colon cancer surveillance: Personal                            history of colonic polyps; 2012 severe                            diverticulitis requiring diverting colostomy,                            segmental sigmoid colectomy; eventual takedown.                            2014 colonoscopy Dr. Ardis Hughs (via anus and via                            colostomy) found 4 subCM polyps, all were removed,                            three retrieved (all adenomas) Medicines:                Monitored Anesthesia Care Procedure:                Pre-Anesthesia Assessment:                           - Prior to the procedure, a History and Physical                            was performed, and patient medications and                            allergies were reviewed. The patient's tolerance of                            previous anesthesia was also reviewed. The risks                            and benefits of the procedure and the sedation                            options and risks were discussed with the patient.                            All questions were answered, and informed consent                            was obtained. Prior Anticoagulants: The patient has  taken Xarelto (rivaroxaban), last dose was 2 days                            prior to procedure. ASA Grade Assessment: III - A                            patient with severe systemic disease. After                            reviewing the risks and benefits, the patient was                            deemed in satisfactory condition to undergo the                            procedure.                           After  obtaining informed consent, the colonoscope                            was passed under direct vision. Throughout the                            procedure, the patient's blood pressure, pulse, and                            oxygen saturations were monitored continuously. The                            Colonoscope was introduced through the anus and                            advanced to the the cecum, identified by                            appendiceal orifice and ileocecal valve. The                            colonoscopy was performed without difficulty. The                            patient tolerated the procedure well. The quality                            of the bowel preparation was good. The ileocecal                            valve, appendiceal orifice, and rectum were                            photographed. Scope In: 1:52:49 PM Scope Out: 2:11:45 PM Scope Withdrawal Time: 0 hours 17 minutes 34 seconds  Total Procedure Duration: 0 hours 18 minutes 56 seconds  Findings:                 Three sessile polyps were found in the transverse                            colon and ascending colon. The polyps were 3 to 5                            mm in size. These polyps were removed with a cold                            snare. Resection and retrieval were complete.                           The sigmoid anastomosis was unusual appearing;                            there was a bulging of the mucosa at site of                            apparent diverticulum. The mucosa of the                            diverticulum was abnormal appearing (edema vs.                            inflammation vs. neoplasm). This was biopsied                            extensively and then the site was labeled with                            submucosal injection of [place] Ink.                           Internal hemorrhoids were found. The hemorrhoids                            were small.                            The exam was otherwise without abnormality on                            direct and retroflexion views. Complications:            No immediate complications. Estimated blood loss:                            None. Estimated Blood Loss:     Estimated blood loss: none. Impression:               - Three 3 to 5 mm polyps in the transverse colon  and in the ascending colon, removed with a cold                            snare. Resected and retrieved.                           - Usual appearing sigmoid anastomosis, see above.                            This was biopsied and labeled with injection of                            [place] Ink.                           - Internal hemorrhoids.                           - The examination was otherwise normal on direct                            and retroflexion views. Recommendation:           - Patient has a contact number available for                            emergencies. The signs and symptoms of potential                            delayed complications were discussed with the                            patient. Return to normal activities tomorrow.                            Written discharge instructions were provided to the                            patient.                           - Resume previous diet.                           - Continue present medications.                           - Repeat colonoscopy is recommended. The                            colonoscopy date will be determined after pathology                            results from today's exam become available for                            review.                           -  OK to resume xarelto tonight. Milus Banister, MD 03/15/2017 2:23:54 PM This report has been signed electronically.

## 2017-03-15 NOTE — Progress Notes (Signed)
Spontaneous respirations throughout. VSS. Resting comfortably. To PACU on room air. Report to  [employer].

## 2017-03-16 ENCOUNTER — Telehealth: Payer: Self-pay | Admitting: *Deleted

## 2017-03-16 NOTE — Telephone Encounter (Signed)
  Follow up Call-  Call back number 03/15/2017  Post procedure Call Back phone  # [phone]  Permission to leave phone message Yes  Some recent data might be hidden     Patient questions:  Do you have a fever, pain , or abdominal swelling? No. Pain Score  0 *  Have you tolerated food without any problems? Yes.    Have you been able to return to your normal activities? Yes.    Do you have any questions about your discharge instructions: Diet   No. Medications  No. Follow up visit  No.  Do you have questions or concerns about your Care? No.  Actions: * If pain score is 4 or above: No action needed, pain <4.

## 2017-03-24 ENCOUNTER — Encounter: Payer: Self-pay | Admitting: Gastroenterology

## 2017-03-24 ENCOUNTER — Telehealth: Payer: Self-pay | Admitting: Gastroenterology

## 2017-03-24 NOTE — Telephone Encounter (Signed)
The pt has been given the information in the path letter    Dear Mr. Carden,   Several polyps that I removed during your recent procedure were completely benign but were proven to be "pre-cancerous" polyps that MAY have grown into cancers if they had not been removed.  Studies shows that at least 20% of women over age 65 and 30% of men over age 75 have pre-cancerous polyps.  Based on current nationally recognized surveillance guidelines, I recommend that you have a repeat colonoscopy in 3 years.   The other biopsies that I took from your colon showed no sign of cancer, infection.  If you develop any new rectal bleeding, abdominal pain or significant bowel habit changes, please contact me before then.    Sincerely,    Milus Banister, MD

## 2017-05-03 DIAGNOSIS — M216X2 Other acquired deformities of left foot: Secondary | ICD-10-CM | POA: Insufficient documentation

## 2017-05-03 DIAGNOSIS — B351 Tinea unguium: Secondary | ICD-10-CM | POA: Diagnosis not present

## 2017-05-03 DIAGNOSIS — M21622 Bunionette of left foot: Secondary | ICD-10-CM

## 2017-05-03 HISTORY — DX: Other acquired deformities of left foot: M21.6X2

## 2017-05-03 HISTORY — DX: Bunionette of left foot: M21.622

## 2017-05-04 DIAGNOSIS — M216X2 Other acquired deformities of left foot: Secondary | ICD-10-CM | POA: Insufficient documentation

## 2017-05-04 HISTORY — DX: Other acquired deformities of left foot: M21.6X2

## 2017-05-24 DIAGNOSIS — J441 Chronic obstructive pulmonary disease with (acute) exacerbation: Secondary | ICD-10-CM | POA: Diagnosis not present

## 2017-06-30 ENCOUNTER — Telehealth: Payer: Self-pay | Admitting: Cardiology

## 2017-06-30 ENCOUNTER — Other Ambulatory Visit: Payer: Self-pay

## 2017-06-30 MED ORDER — XARELTO 20 MG PO TABS: ORAL_TABLET | ORAL | 6 refills | Status: DC

## 2017-06-30 MED ORDER — DIGOXIN 250 MCG PO TABS: ORAL_TABLET | ORAL | 6 refills | Status: DC

## 2017-06-30 NOTE — Telephone Encounter (Signed)
°*  STAT* If patient is at the pharmacy, call can be transferred to refill team.   1. Which medications need to be refilled? (please list name of each medication and dose if known) Digoxin 229mcg  2. Which pharmacy/location (including street and city if local pharmacy) is medication to be sent to? [facility]  3. Do they need a 30 day or 90 day supp 15   *STAT* If patient is at the pharmacy, call can be transferred to refill team.   1. Which medications need to be refilled? (please list name of each medication and dose if known) Xarelto 20mg    2. Which pharmacy/location (including street and city if local pharmacy) is medication to be sent to? [facility]   3. Do they need a 30 day or 90 day supply? 15  Patient has appt scheduled for 07/08/17 here in [place]

## 2017-07-08 ENCOUNTER — Ambulatory Visit (INDEPENDENT_AMBULATORY_CARE_PROVIDER_SITE_OTHER): Payer: PPO | Admitting: Cardiology

## 2017-07-08 ENCOUNTER — Encounter: Payer: Self-pay | Admitting: Cardiology

## 2017-07-08 VITALS — BP 114/74 | HR 76 | Resp 10 | Ht 71.0 in | Wt 158.1 lb

## 2017-07-08 DIAGNOSIS — I482 Chronic atrial fibrillation, unspecified: Secondary | ICD-10-CM

## 2017-07-08 DIAGNOSIS — Z7901 Long term (current) use of anticoagulants: Secondary | ICD-10-CM | POA: Diagnosis not present

## 2017-07-08 DIAGNOSIS — R0609 Other forms of dyspnea: Secondary | ICD-10-CM | POA: Diagnosis not present

## 2017-07-08 DIAGNOSIS — R0600 Dyspnea, unspecified: Secondary | ICD-10-CM

## 2017-07-08 DIAGNOSIS — I6990 Unspecified sequelae of unspecified cerebrovascular disease: Secondary | ICD-10-CM

## 2017-07-08 DIAGNOSIS — I10 Essential (primary) hypertension: Secondary | ICD-10-CM | POA: Diagnosis not present

## 2017-07-08 DIAGNOSIS — I2510 Atherosclerotic heart disease of native coronary artery without angina pectoris: Secondary | ICD-10-CM

## 2017-07-08 HISTORY — DX: Dyspnea, unspecified: R06.00

## 2017-07-08 HISTORY — DX: Other forms of dyspnea: R06.09

## 2017-07-08 NOTE — Patient Instructions (Signed)
Medication Instructions:  Your physician recommends that you continue on your current medications as directed. Please refer to the Current Medication list given to you today.  Lab work: Your physician recommends that you have lab work today: CMP, TSH, CBC, and B-12   Testing/Procedures: Your physician has requested that you have an echocardiogram. Echocardiography is a painless test that uses sound waves to create images of your heart. It provides your doctor with information about the size and shape of your heart and how well your heart's chambers and valves are working. This procedure takes approximately one hour. There are no restrictions for this procedure.  Follow-Up: Your physician recommends that you schedule a follow-up appointment in: 1 month with Dr. Agustin Cree   Any Other Special Instructions Will Be Listed Below (If Applicable).     If you need a refill on your cardiac medications before your next appointment, please call your pharmacy.

## 2017-07-08 NOTE — Addendum Note (Signed)
Addended by: Aleatha Borer on: 07/08/2017 12:28 PM   Modules accepted: Orders

## 2017-07-08 NOTE — Progress Notes (Signed)
Cardiology Office Note:    Date:  07/08/2017   ID:  Johnathan Bauer, DOB 09/15/1950, MRN [id number]  PCP:  Enid Skeens., MD  Cardiologist:  Jenne Campus, MD    Referring MD: Enid Skeens., MD   Chief Complaint  Patient presents with  . Follow-up  And having shortness of breath  History of Present Illness:    Johnathan Bauer is a 66 y.o. male with chronic atrial fibrillation, coronary artery disease, history of CVA.  Described to have some exertional shortness of breath.  Denies having chest pain tightness squeezing pressure burning chest, no palpitations.  He comes today in the office with his wife.  Apparently no more drinking but he smokes about 2 cigarettes a day.  No proximal nocturnal dyspnea, no swelling of lower extremities.  Past Medical History:  Diagnosis Date  . Arthritis    "touch in my right thumb" (12/31/2014)  . Chronic atrial fibrillation (HCC)    did not like coumadin  . Chronic kidney disease 09/2012  . Coronary artery disease    s/p PCI Sept 2013 with DES OM  . Diverticulitis with perforation   . Pneumonia 09/2012  . Stroke [facility]) 04/2016    Past Surgical History:  Procedure Laterality Date  . COLON SURGERY    . COLOSTOMY REVERSAL  06/06/2013   Dr Rosendo Gros  . COLOSTOMY REVISION  09/02/2012   Procedure: COLON RESECTION SIGMOID;  Surgeon: Serafina Mitchell, MD;  Location: [facility] OR;  Service: Vascular;  Laterality: N/A;  Sigmoid resection with End Colostomy.  . COLOSTOMY TAKEDOWN N/A 06/06/2013   Procedure: LAPAROSCOPIC OSTOMY TAKEDOWN AND ANASTOMOSIS ;  Surgeon: Ralene Ok, MD;  Location: [place];  Service: General;  Laterality: N/A;  . CORONARY ANGIOPLASTY WITH STENT PLACEMENT  06/2012   "1"  . HERNIA REPAIR    . INCISIONAL HERNIA REPAIR  12/31/2014   w/mesh  . INCISIONAL HERNIA REPAIR N/A 12/31/2014   Procedure: OPEN INCISIONAL HERNIA REPAIR WITH MESH (TAR PROCEDURE);  Surgeon: Ralene Ok, MD;  Location: [place];  Service: General;  Laterality:  N/A;  . INSERTION OF MESH N/A 12/31/2014   Procedure: INSERTION OF MESH;  Surgeon: Ralene Ok, MD;  Location: [place];  Service: General;  Laterality: N/A;  . LAPAROTOMY  09/02/2012   Procedure: EXPLORATORY LAPAROTOMY;  Surgeon: Serafina Mitchell, MD;  Location: [facility] OR;  Service: Vascular;  Laterality: N/A;  Exploratory Laparotomy with Sigmoid resection with end colocstomy.  . TONSILLECTOMY      Current Medications: Current Meds  Medication Sig  . digoxin (LANOXIN) 0.25 MG tablet TAKE 1 TABLET (250 MCG TOTAL) BY MOUTH DAILY.  . metoprolol (LOPRESSOR) 50 MG tablet Take 50 mg by mouth daily.  . nitroGLYCERIN (NITROSTAT) 0.4 MG SL tablet Place 0.4 mg under the tongue.  Marland Kitchen XARELTO 20 MG TABS tablet TAKE 1 TABLET (20 MG TOTAL) BY MOUTH DAILY WITH EVENING MEAL.   Current Facility-Administered Medications for the 07/08/17 encounter (Office Visit) with Park Liter, MD  Medication  . 0.9 %  sodium chloride infusion     Allergies:   Codeine and Poison ivy extract [poison ivy extract]   Social History   Social History  . Marital status: Married    Spouse name: N/A  . Number of children: 2  . Years of education: N/A   Occupational History  . retired    Social History Main Topics  . Smoking status: Current Some Day Smoker    Packs/day: 0.25  Years: 45.00    Types: Cigarettes  . Smokeless tobacco: Never Used     Comment: quit latter part of last year  . Alcohol use No  . Drug use: No  . Sexual activity: Yes   Other Topics Concern  . None   Social History Narrative  . None     Family History: The patient's family history includes Lymphoma in his father; Prostate cancer in his paternal grandfather. There is no history of Colon cancer. ROS:   Please see the history of present illness.    All 14 point review of systems negative except as described per history of present illness  EKGs/Labs/Other Studies Reviewed:      Recent Labs: No results found for requested labs  within last 8760 hours.  Recent Lipid Panel No results found for: CHOL, TRIG, HDL, CHOLHDL, VLDL, LDLCALC, LDLDIRECT  Physical Exam:    VS:  BP 114/74   Pulse 76   Resp 10   Ht 5\' 11"  (1.803 m)   Wt 158 lb 1.9 oz (71.7 kg)   BMI 22.05 kg/m     Wt Readings from Last 3 Encounters:  07/08/17 158 lb 1.9 oz (71.7 kg)  03/15/17 161 lb (73 kg)  11/30/16 161 lb 6 oz (73.2 kg)     GEN:  Well nourished, well developed in no acute distress HEENT: Normal NECK: No JVD; No carotid bruits LYMPHATICS: No lymphadenopathy CARDIAC: Irregularly irregular, no murmurs, no rubs, no gallops RESPIRATORY:  Clear to auscultation without rales, wheezing or rhonchi  ABDOMEN: Soft, non-tender, non-distended MUSCULOSKELETAL:  No edema; No deformity  SKIN: Warm and dry LOWER EXTREMITIES: no swelling NEUROLOGIC:  Alert and oriented x 3 PSYCHIATRIC:  Normal affect   ASSESSMENT:    1. Chronic atrial fibrillation ([place])   2. Arteriosclerotic cardiovascular disease (ASCVD)   3. Coronary artery disease involving native coronary artery of native heart without angina pectoris   4. Essential hypertension   5. Chronic anticoagulation   6. Late effects of CVA (cerebrovascular accident)   7. Dyspnea on exertion    PLAN:    In order of problems listed above:  1. Chronic atrial fibrillation: Rate appears to be controlled, continue anticoagulation. 2. CAD: Stable asymptomatic continue present management 3. Essential hypertension: Stable we will continue present management 4. Dyspnea on exertion: Echocardiogram will be done also will do complete metabolic panel as well as TSH B12 and CBC.  I will see him back in 1 month.  Medication Adjustments/Labs and Tests Ordered: Current medicines are reviewed at length with the patient today.  Concerns regarding medicines are outlined above.  Orders Placed This Encounter  Procedures  . COMPLETE METABOLIC PANEL WITH GFR  . TSH  . B12  . CBC  . ECHOCARDIOGRAM  COMPLETE   Medication changes: No orders of the defined types were placed in this encounter.   Signed, Park Liter, MD, [facility] 07/08/2017 11:44 AM    [place]

## 2017-07-09 LAB — COMPREHENSIVE METABOLIC PANEL
ALT: 16 IU/L (ref 0–44)
AST: 19 IU/L (ref 0–40)
Albumin/Globulin Ratio: 1.7 (ref 1.2–2.2)
Albumin: 3.8 g/dL (ref 3.6–4.8)
Alkaline Phosphatase: 82 IU/L (ref 39–117)
BUN / CREAT RATIO: 19 (ref 10–24)
BUN: 19 mg/dL (ref 8–27)
Bilirubin Total: 0.8 mg/dL (ref 0.0–1.2)
CALCIUM: 9.1 mg/dL (ref 8.6–10.2)
CO2: 22 mmol/L (ref 20–29)
Chloride: 103 mmol/L (ref 96–106)
Creatinine, Ser: 0.98 mg/dL (ref 0.76–1.27)
GFR calc Af Amer: 92 mL/min/{1.73_m2} (ref >59)
GFR calc non Af Amer: 80 mL/min/{1.73_m2} (ref >59)
GLUCOSE: 89 mg/dL (ref 65–99)
Globulin, Total: 2.2 g/dL (ref 1.5–4.5)
Potassium: 4.8 mmol/L (ref 3.5–5.2)
Sodium: 140 mmol/L (ref 134–144)
TOTAL PROTEIN: 6 g/dL (ref 6.0–8.5)

## 2017-07-09 LAB — CBC
HEMOGLOBIN: 16.4 g/dL (ref 13.0–17.7)
Hematocrit: 48.6 % (ref 37.5–51.0)
MCH: 34.3 pg — ABNORMAL HIGH (ref 26.6–33.0)
MCHC: 33.7 g/dL (ref 31.5–35.7)
MCV: 102 fL — ABNORMAL HIGH (ref 79–97)
PLATELETS: 193 10*3/uL (ref 150–379)
RBC: 4.78 x10E6/uL (ref 4.14–5.80)
RDW: 14.3 % (ref 12.3–15.4)
WBC: 6.7 10*3/uL (ref 3.4–10.8)

## 2017-07-09 LAB — TSH: TSH: 3.12 u[IU]/mL (ref 0.450–4.500)

## 2017-07-09 LAB — VITAMIN B12: Vitamin B-12: 307 pg/mL (ref 232–1245)

## 2017-08-05 ENCOUNTER — Other Ambulatory Visit: Payer: Self-pay

## 2017-08-05 ENCOUNTER — Ambulatory Visit (HOSPITAL_BASED_OUTPATIENT_CLINIC_OR_DEPARTMENT_OTHER)
Admission: RE | Admit: 2017-08-05 | Discharge: 2017-08-05 | Disposition: A | Discharge status: Home / Self Care | Payer: PPO | Source: Ambulatory Visit | Attending: Cardiology | Admitting: Cardiology

## 2017-08-05 ENCOUNTER — Telehealth: Payer: Self-pay | Admitting: Cardiology

## 2017-08-05 DIAGNOSIS — I2510 Atherosclerotic heart disease of native coronary artery without angina pectoris: Secondary | ICD-10-CM | POA: Insufficient documentation

## 2017-08-05 DIAGNOSIS — I340 Nonrheumatic mitral (valve) insufficiency: Secondary | ICD-10-CM | POA: Insufficient documentation

## 2017-08-05 DIAGNOSIS — I482 Chronic atrial fibrillation, unspecified: Secondary | ICD-10-CM

## 2017-08-05 DIAGNOSIS — I517 Cardiomegaly: Secondary | ICD-10-CM | POA: Diagnosis not present

## 2017-08-05 MED ORDER — XARELTO 20 MG PO TABS: ORAL_TABLET | ORAL | 1 refills | Status: DC

## 2017-08-05 NOTE — Progress Notes (Signed)
Echocardiogram 2D Echocardiogram has been performed.  Johnathan Bauer 08/05/2017, 10:30 AM

## 2017-08-05 NOTE — Telephone Encounter (Signed)
[facility] Johnathan Bauer and we need to recall in with correct dosage and refills if we want him to continue to take meds.Marland Kitchen

## 2017-08-05 NOTE — Telephone Encounter (Signed)
90 day supply with 1 refill was sent.

## 2017-08-11 ENCOUNTER — Ambulatory Visit: Payer: PPO | Admitting: Cardiology

## 2017-08-11 ENCOUNTER — Encounter: Payer: Self-pay | Admitting: Cardiology

## 2017-08-11 VITALS — BP 110/86 | HR 87 | Ht 71.0 in | Wt 158.0 lb

## 2017-08-11 DIAGNOSIS — I6990 Unspecified sequelae of unspecified cerebrovascular disease: Secondary | ICD-10-CM

## 2017-08-11 DIAGNOSIS — I482 Chronic atrial fibrillation, unspecified: Secondary | ICD-10-CM

## 2017-08-11 DIAGNOSIS — I2510 Atherosclerotic heart disease of native coronary artery without angina pectoris: Secondary | ICD-10-CM

## 2017-08-11 NOTE — Progress Notes (Signed)
Cardiology Office Note:    Date:  08/11/2017   ID:  Johnathan Bauer, DOB 20-Mar-1951, MRN [id number]  PCP:  Enid Skeens., MD  Cardiologist:  Jenne Campus, MD    Referring MD: Enid Skeens., MD   Chief Complaint  Patient presents with  . Follow-up  Doing fair  History of Present Illness:    Johnathan Bauer is a 66 y.o. male with past medical history significant for CVA, chronic atrial fibrillation, CAD.  He came to our office for follow-up.  In complaint is the fact that he started all the time and does not do anything.  He is to be very socially active used to go to restaurants used to go take a trip with his wife but he does not do it anymore.  We did echocardiogram to check ejection fraction which was normal, he did basic laboratory tests although were normal.  His wife is with him like always.  I suspect depression may be a problem here.  Past Medical History:  Diagnosis Date  . Arthritis    "touch in my right thumb" (12/31/2014)  . Chronic atrial fibrillation (HCC)    did not like coumadin  . Chronic kidney disease 09/2012  . Coronary artery disease    s/p PCI Sept 2013 with DES OM  . Diverticulitis with perforation   . Pneumonia 09/2012  . Stroke [facility]) 04/2016    Past Surgical History:  Procedure Laterality Date  . COLON SURGERY    . COLOSTOMY REVERSAL  06/06/2013   Dr Rosendo Gros  . COLOSTOMY REVISION  09/02/2012   Procedure: COLON RESECTION SIGMOID;  Surgeon: Serafina Mitchell, MD;  Location: [facility] OR;  Service: Vascular;  Laterality: N/A;  Sigmoid resection with End Colostomy.  . COLOSTOMY TAKEDOWN N/A 06/06/2013   Procedure: LAPAROSCOPIC OSTOMY TAKEDOWN AND ANASTOMOSIS ;  Surgeon: Ralene Ok, MD;  Location: [place];  Service: General;  Laterality: N/A;  . CORONARY ANGIOPLASTY WITH STENT PLACEMENT  06/2012   "1"  . HERNIA REPAIR    . INCISIONAL HERNIA REPAIR  12/31/2014   w/mesh  . INCISIONAL HERNIA REPAIR N/A 12/31/2014   Procedure: OPEN INCISIONAL HERNIA  REPAIR WITH MESH (TAR PROCEDURE);  Surgeon: Ralene Ok, MD;  Location: [place];  Service: General;  Laterality: N/A;  . INSERTION OF MESH N/A 12/31/2014   Procedure: INSERTION OF MESH;  Surgeon: Ralene Ok, MD;  Location: [place];  Service: General;  Laterality: N/A;  . LAPAROTOMY  09/02/2012   Procedure: EXPLORATORY LAPAROTOMY;  Surgeon: Serafina Mitchell, MD;  Location: [facility] OR;  Service: Vascular;  Laterality: N/A;  Exploratory Laparotomy with Sigmoid resection with end colocstomy.  . TONSILLECTOMY      Current Medications: Current Meds  Medication Sig  . digoxin (LANOXIN) 0.25 MG tablet TAKE 1 TABLET (250 MCG TOTAL) BY MOUTH DAILY.  . metoprolol (LOPRESSOR) 50 MG tablet Take 50 mg by mouth daily.  . nitroGLYCERIN (NITROSTAT) 0.4 MG SL tablet Place 0.4 mg under the tongue.  Marland Kitchen XARELTO 20 MG TABS tablet TAKE 1 TABLET (20 MG TOTAL) BY MOUTH DAILY WITH EVENING MEAL.   Current Facility-Administered Medications for the 08/11/17 encounter (Office Visit) with Park Liter, MD  Medication  . 0.9 %  sodium chloride infusion     Allergies:   Codeine and Poison ivy extract [poison ivy extract]   Social History   Socioeconomic History  . Marital status: Married    Spouse name: None  . Number of children: 2  .  Years of education: None  . Highest education level: None  Social Needs  . Financial resource strain: None  . Food insecurity - worry: None  . Food insecurity - inability: None  . Transportation needs - medical: None  . Transportation needs - non-medical: None  Occupational History  . Occupation: retired  Tobacco Use  . Smoking status: Current Some Day Smoker    Packs/day: 0.25    Years: 45.00    Pack years: 11.25    Types: Cigarettes  . Smokeless tobacco: Never Used  . Tobacco comment: quit latter part of last year  Substance and Sexual Activity  . Alcohol use: No  . Drug use: No  . Sexual activity: Yes  Other Topics Concern  . None  Social History Narrative    . None     Family History: The patient's family history includes Lymphoma in his father; Prostate cancer in his paternal grandfather. There is no history of Colon cancer. ROS:   Please see the history of present illness.    All 14 point review of systems negative except as described per history of present illness  EKGs/Labs/Other Studies Reviewed:      Recent Labs: 07/08/2017: ALT 16; BUN 19; Creatinine, Ser 0.98; Hemoglobin 16.4; Platelets 193; Potassium 4.8; Sodium 140; TSH 3.120  Recent Lipid Panel No results found for: CHOL, TRIG, HDL, CHOLHDL, VLDL, LDLCALC, LDLDIRECT  Physical Exam:    VS:  BP 110/86 (BP Location: Left Arm, Patient Position: Sitting, Cuff Size: Normal)   Pulse 87   Ht 5\' 11"  (1.803 m)   Wt 158 lb (71.7 kg)   SpO2 98%   BMI 22.04 kg/m     Wt Readings from Last 3 Encounters:  08/11/17 158 lb (71.7 kg)  07/08/17 158 lb 1.9 oz (71.7 kg)  03/15/17 161 lb (73 kg)     GEN:  Well nourished, well developed in no acute distress HEENT: Normal NECK: No JVD; No carotid bruits LYMPHATICS: No lymphadenopathy CARDIAC: RRR, no murmurs, no rubs, no gallops RESPIRATORY:  Clear to auscultation without rales, wheezing or rhonchi  ABDOMEN: Soft, non-tender, non-distended MUSCULOSKELETAL:  No edema; No deformity  SKIN: Warm and dry LOWER EXTREMITIES: no swelling NEUROLOGIC:  Alert and oriented x 3 PSYCHIATRIC:  Normal affect   ASSESSMENT:    1. Arteriosclerotic cardiovascular disease (ASCVD)   2. Chronic atrial fibrillation ([place]): Anticoagulated his chads 2 Vas score equals 3   3. 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 Coronary artery disease: Stable without any issues we will continue present management. 2. Atrial fibrillation anticoagulated chads 2 Vascor equals 3. 3. Lasix effect of CVA.  No new issues. 4. I suspect depression is a problem I advised to  follow-up with primary care physician I spoke also to his wife and advised him to look for a psychologist or psychotherapist to address this issue.  Apparently couple years ago they went to get her to psychotherapist however the patient did not like that person.   Medication Adjustments/Labs and Tests Ordered: Current medicines are reviewed at length with the patient today.  Concerns regarding medicines are outlined above.  No orders of the defined types were placed in this encounter.  Medication changes: No orders of the defined types were placed in this encounter.   Signed, Park Liter, MD, [facility] 08/11/2017 11:33 AM    [place]

## 2017-08-11 NOTE — Patient Instructions (Signed)
Medication Instructions:  Your physician recommends that you continue on your current medications as directed. Please refer to the Current Medication list given to you today.  Labwork: None ordered  Testing/Procedures: None ordered  Follow-Up: Your physician recommends that you schedule a follow-up appointment in: 3 months with Dr. Krasowski   Any Other Special Instructions Will Be Listed Below (If Applicable).     If you need a refill on your cardiac medications before your next appointment, please call your pharmacy.   

## 2017-11-10 DIAGNOSIS — M21622 Bunionette of left foot: Secondary | ICD-10-CM | POA: Diagnosis not present

## 2017-11-10 DIAGNOSIS — M216X2 Other acquired deformities of left foot: Secondary | ICD-10-CM | POA: Diagnosis not present

## 2017-11-10 DIAGNOSIS — B351 Tinea unguium: Secondary | ICD-10-CM | POA: Diagnosis not present

## 2017-11-15 ENCOUNTER — Ambulatory Visit (INDEPENDENT_AMBULATORY_CARE_PROVIDER_SITE_OTHER): Payer: PPO | Admitting: Cardiology

## 2017-11-15 ENCOUNTER — Encounter: Payer: Self-pay | Admitting: Cardiology

## 2017-11-15 VITALS — BP 110/68 | HR 88 | Ht 71.0 in | Wt 155.4 lb

## 2017-11-15 DIAGNOSIS — I482 Chronic atrial fibrillation, unspecified: Secondary | ICD-10-CM

## 2017-11-15 DIAGNOSIS — I2510 Atherosclerotic heart disease of native coronary artery without angina pectoris: Secondary | ICD-10-CM | POA: Diagnosis not present

## 2017-11-15 DIAGNOSIS — I6990 Unspecified sequelae of unspecified cerebrovascular disease: Secondary | ICD-10-CM | POA: Diagnosis not present

## 2017-11-15 MED ORDER — DIGOXIN 250 MCG PO TABS: ORAL_TABLET | ORAL | 3 refills | Status: DC

## 2017-11-15 NOTE — Patient Instructions (Signed)
Medication Instructions:  Your physician recommends that you continue on your current medications as directed. Please refer to the Current Medication list given to you today.  Labwork: None  Testing/Procedures: None  Follow-Up: Your physician wants you to follow-up in: 5 months. You will receive a reminder letter in the mail two months in advance. If you don't receive a letter, please call our office to schedule the follow-up appointment.  Any Other Special Instructions Will Be Listed Below (If Applicable).     If you need a refill on your cardiac medications before your next appointment, please call your pharmacy.   

## 2017-11-15 NOTE — Progress Notes (Signed)
Cardiology Office Note:    Date:  11/15/2017   ID:  Johnathan Bauer, DOB [birth year], MRN [id number]  PCP:  Enid Skeens., MD  Cardiologist:  Jenne Campus, MD    Referring MD: Enid Skeens., MD   Chief Complaint  Patient presents with  . Follow-up  Doing well cardiac wise  History of Present Illness:    Johnathan Bauer is a 67 y.o. male with chronic atrial fibrillation, coronary artery disease, status post CVA.  Anticoagulated which I will continue.  Again the conversation revolved around his suspected depression.  His wife was with [place] in the office and we talk about it.  She is trying to do her best to help keep with it.  She is trying to get him out of the house trying to make him eat better still I think he required some medications.  He promised me to go and talk to Dr. Mitzi Hansen about this.  He does not have any suicidal idealization.  Cardiac wise doing well.  Past Medical History:  Diagnosis Date  . Arthritis    "touch in my right thumb" (12/31/2014)  . Chronic atrial fibrillation (HCC)    did not like coumadin  . Chronic kidney disease 09/2012  . Coronary artery disease    s/p PCI Sept 2013 with DES OM  . Diverticulitis with perforation   . Pneumonia 09/2012  . Stroke [facility]) 04/2016    Past Surgical History:  Procedure Laterality Date  . COLON SURGERY    . COLOSTOMY REVERSAL  06/06/2013   Dr Rosendo Gros  . COLOSTOMY REVISION  09/02/2012   Procedure: COLON RESECTION SIGMOID;  Surgeon: Serafina Mitchell, MD;  Location: [facility] OR;  Service: Vascular;  Laterality: N/A;  Sigmoid resection with End Colostomy.  . COLOSTOMY TAKEDOWN N/A 06/06/2013   Procedure: LAPAROSCOPIC OSTOMY TAKEDOWN AND ANASTOMOSIS ;  Surgeon: Ralene Ok, MD;  Location: [place];  Service: General;  Laterality: N/A;  . CORONARY ANGIOPLASTY WITH STENT PLACEMENT  06/2012   "1"  . HERNIA REPAIR    . INCISIONAL HERNIA REPAIR  12/31/2014   w/mesh  . INCISIONAL HERNIA REPAIR N/A 12/31/2014   Procedure: OPEN  INCISIONAL HERNIA REPAIR WITH MESH (TAR PROCEDURE);  Surgeon: Ralene Ok, MD;  Location: [place];  Service: General;  Laterality: N/A;  . INSERTION OF MESH N/A 12/31/2014   Procedure: INSERTION OF MESH;  Surgeon: Ralene Ok, MD;  Location: [place];  Service: General;  Laterality: N/A;  . LAPAROTOMY  09/02/2012   Procedure: EXPLORATORY LAPAROTOMY;  Surgeon: Serafina Mitchell, MD;  Location: [facility] OR;  Service: Vascular;  Laterality: N/A;  Exploratory Laparotomy with Sigmoid resection with end colocstomy.  . TONSILLECTOMY      Current Medications: Current Meds  Medication Sig  . digoxin (LANOXIN) 0.25 MG tablet TAKE 1 TABLET (250 MCG TOTAL) BY MOUTH DAILY.  . metoprolol (LOPRESSOR) 50 MG tablet Take 50 mg by mouth daily.  . nitroGLYCERIN (NITROSTAT) 0.4 MG SL tablet Place 0.4 mg under the tongue.  Marland Kitchen XARELTO 20 MG TABS tablet TAKE 1 TABLET (20 MG TOTAL) BY MOUTH DAILY WITH EVENING MEAL.   Current Facility-Administered Medications for the 11/15/17 encounter (Office Visit) with Park Liter, MD  Medication  . 0.9 %  sodium chloride infusion     Allergies:   Codeine and Poison ivy extract [poison ivy extract]   Social History   Socioeconomic History  . Marital status: Married    Spouse name: None  . Number of  children: 2  . Years of education: None  . Highest education level: None  Social Needs  . Financial resource strain: None  . Food insecurity - worry: None  . Food insecurity - inability: None  . Transportation needs - medical: None  . Transportation needs - non-medical: None  Occupational History  . Occupation: retired  Tobacco Use  . Smoking status: Current Some Day Smoker    Packs/day: 0.25    Years: 45.00    Pack years: 11.25    Types: Cigarettes  . Smokeless tobacco: Never Used  . Tobacco comment: quit latter part of last year  Substance and Sexual Activity  . Alcohol use: No  . Drug use: No  . Sexual activity: Yes  Other Topics Concern  . None  Social  History Narrative  . None     Family History: The patient's family history includes Lymphoma in his father; Prostate cancer in his paternal grandfather. There is no history of Colon cancer. ROS:   Please see the history of present illness.    All 14 point review of systems negative except as described per history of present illness  EKGs/Labs/Other Studies Reviewed:      Recent Labs: 07/08/2017: ALT 16; BUN 19; Creatinine, Ser 0.98; Hemoglobin 16.4; Platelets 193; Potassium 4.8; Sodium 140; TSH 3.120  Recent Lipid Panel No results found for: CHOL, TRIG, HDL, CHOLHDL, VLDL, LDLCALC, LDLDIRECT  Physical Exam:    VS:  BP 110/68   Pulse 88   Ht 5\' 11"  (1.803 m)   Wt 155 lb 6.4 oz (70.5 kg)   SpO2 98%   BMI 21.67 kg/m     Wt Readings from Last 3 Encounters:  11/15/17 155 lb 6.4 oz (70.5 kg)  08/11/17 158 lb (71.7 kg)  07/08/17 158 lb 1.9 oz (71.7 kg)     GEN:  Well nourished, well developed in no acute distress HEENT: Normal NECK: No JVD; No carotid bruits LYMPHATICS: No lymphadenopathy CARDIAC: RRR, no murmurs, no rubs, no gallops RESPIRATORY:  Clear to auscultation without rales, wheezing or rhonchi  ABDOMEN: Soft, non-tender, non-distended MUSCULOSKELETAL:  No edema; No deformity  SKIN: Warm and dry LOWER EXTREMITIES: no swelling NEUROLOGIC:  Alert and oriented x 3 PSYCHIATRIC:  Normal affect   ASSESSMENT:    1. Chronic atrial fibrillation ([place]): Anticoagulated his chads 2 Vas score equals 3   2. Coronary artery disease involving native coronary artery of native heart without angina pectoris   3. 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 Chronic atrial fibrillation, anticoagulated continue present management. 2. Artery disease stable on appropriate medications. 3. Late effects of CVA doing well from that point of view 4. Depression: No suicidal idealization.  Need  to be addressed by internal medicine team.   Medication Adjustments/Labs and Tests Ordered: Current medicines are reviewed at length with the patient today.  Concerns regarding medicines are outlined above.  No orders of the defined types were placed in this encounter.  Medication changes: No orders of the defined types were placed in this encounter.   Signed, Park Liter, MD, [facility] 11/15/2017 11:11 AM    [place]

## 2017-11-15 NOTE — Addendum Note (Signed)
Addended by: Austin Miles on: 11/15/2017 11:22 AM   Modules accepted: Orders

## 2017-11-29 DIAGNOSIS — F331 Major depressive disorder, recurrent, moderate: Secondary | ICD-10-CM | POA: Diagnosis not present

## 2017-12-12 ENCOUNTER — Telehealth: Payer: Self-pay | Admitting: Cardiology

## 2017-12-12 MED ORDER — XARELTO 20 MG PO TABS: ORAL_TABLET | ORAL | 2 refills | Status: DC

## 2017-12-12 NOTE — Telephone Encounter (Signed)
Refill sent.

## 2017-12-12 NOTE — Telephone Encounter (Signed)
[facility] [address] [facility] [place]. Patient is down to a few tablets asking for 90 day supply

## 2017-12-19 ENCOUNTER — Telehealth: Payer: Self-pay | Admitting: Cardiology

## 2017-12-19 MED ORDER — METOPROLOL TARTRATE 50 MG PO TABS: 50.0000 mg | ORAL_TABLET | Freq: Every day | ORAL | 3 refills | Status: DC

## 2017-12-19 NOTE — Telephone Encounter (Signed)
Please call patient about refilling medications

## 2017-12-19 NOTE — Telephone Encounter (Signed)
Patient needed metoprolol 50 mg daily refilled. Last office visit on 11/15/2017. [facility] [place].

## 2017-12-29 DIAGNOSIS — F1010 Alcohol abuse, uncomplicated: Secondary | ICD-10-CM | POA: Diagnosis not present

## 2017-12-29 DIAGNOSIS — F331 Major depressive disorder, recurrent, moderate: Secondary | ICD-10-CM | POA: Diagnosis not present

## 2018-05-02 DIAGNOSIS — L821 Other seborrheic keratosis: Secondary | ICD-10-CM | POA: Diagnosis not present

## 2018-05-02 DIAGNOSIS — C44629 Squamous cell carcinoma of skin of left upper limb, including shoulder: Secondary | ICD-10-CM | POA: Diagnosis not present

## 2018-05-02 DIAGNOSIS — L820 Inflamed seborrheic keratosis: Secondary | ICD-10-CM | POA: Diagnosis not present

## 2018-05-02 DIAGNOSIS — L578 Other skin changes due to chronic exposure to nonionizing radiation: Secondary | ICD-10-CM | POA: Diagnosis not present

## 2018-05-02 DIAGNOSIS — B351 Tinea unguium: Secondary | ICD-10-CM | POA: Diagnosis not present

## 2018-05-22 ENCOUNTER — Telehealth: Payer: Self-pay | Admitting: Cardiology

## 2018-05-22 NOTE — Telephone Encounter (Signed)
Patient informed to have this clarified with the surgeon. Patient verbally understands.

## 2018-05-22 NOTE — Telephone Encounter (Signed)
Patient having cancer surgery tomorrow, when should he stop Zarelto? Dr. Carlyle Lipa is surgeon, [phone] if you need to speak to him.

## 2018-05-23 DIAGNOSIS — C44629 Squamous cell carcinoma of skin of left upper limb, including shoulder: Secondary | ICD-10-CM | POA: Diagnosis not present

## 2018-05-26 DIAGNOSIS — J189 Pneumonia, unspecified organism: Secondary | ICD-10-CM | POA: Diagnosis not present

## 2018-05-26 DIAGNOSIS — R062 Wheezing: Secondary | ICD-10-CM | POA: Diagnosis not present

## 2018-06-20 ENCOUNTER — Ambulatory Visit: Payer: PPO | Admitting: Cardiology

## 2018-06-21 ENCOUNTER — Ambulatory Visit: Payer: PPO | Admitting: Cardiology

## 2018-06-22 ENCOUNTER — Ambulatory Visit: Payer: PPO | Admitting: Cardiology

## 2018-06-23 ENCOUNTER — Ambulatory Visit (INDEPENDENT_AMBULATORY_CARE_PROVIDER_SITE_OTHER): Payer: PPO | Admitting: Cardiology

## 2018-06-23 ENCOUNTER — Encounter: Payer: Self-pay | Admitting: Cardiology

## 2018-06-23 VITALS — BP 120/64 | HR 77 | Ht 71.0 in

## 2018-06-23 DIAGNOSIS — I482 Chronic atrial fibrillation, unspecified: Secondary | ICD-10-CM

## 2018-06-23 DIAGNOSIS — I6990 Unspecified sequelae of unspecified cerebrovascular disease: Secondary | ICD-10-CM

## 2018-06-23 DIAGNOSIS — I2510 Atherosclerotic heart disease of native coronary artery without angina pectoris: Secondary | ICD-10-CM | POA: Diagnosis not present

## 2018-06-23 DIAGNOSIS — F1010 Alcohol abuse, uncomplicated: Secondary | ICD-10-CM

## 2018-06-23 DIAGNOSIS — Z7901 Long term (current) use of anticoagulants: Secondary | ICD-10-CM | POA: Diagnosis not present

## 2018-06-23 NOTE — Patient Instructions (Signed)
Medication Instructions:  Your physician recommends that you continue on your current medications as directed. Please refer to the Current Medication list given to you today.  If you need a refill on your cardiac medications before your next appointment, please call your pharmacy.   Lab work: Your physician recommends that you return for lab work today: Lft, Lipids   If you have labs (blood work) drawn today and your tests are completely normal, you will receive your results only by: Marland Kitchen MyChart Message (if you have MyChart) OR . A paper copy in the mail If you have any lab test that is abnormal or we need to change your treatment, we will call you to review the results.  Testing/Procedures: Your physician has requested that you have an echocardiogram. Echocardiography is a painless test that uses sound waves to create images of your heart. It provides your doctor with information about the size and shape of your heart and how well your heart's chambers and valves are working. This procedure takes approximately one hour. There are no restrictions for this procedure.      Follow-Up: At [facility], you and your health needs are our priority.  As part of our continuing mission to provide you with exceptional heart care, we have created designated Provider Care Teams.  These Care Teams include your primary Cardiologist (physician) and Advanced Practice Providers (APPs -  Physician Assistants and Nurse Practitioners) who all work together to provide you with the care you need, when you need it. You will need a follow up appointment in 5 months.  Please call our office 2 months in advance to schedule this appointment.  You may see No primary care provider on file. or another member of our [employer] Provider Team in : Shirlee More, MD . Jyl Heinz, MD  Any Other Special Instructions Will Be Listed Below (If Applicable).  Echocardiogram An echocardiogram, or echocardiography,  uses sound waves (ultrasound) to produce an image of your heart. The echocardiogram is simple, painless, obtained within a short period of time, and offers valuable information to your health care provider. The images from an echocardiogram can provide information such as:  Evidence of coronary artery disease (CAD).  Heart size.  Heart muscle function.  Heart valve function.  Aneurysm detection.  Evidence of a past heart attack.  Fluid buildup around the heart.  Heart muscle thickening.  Assess heart valve function.  Tell a health care provider about:  Any allergies you have.  All medicines you are taking, including vitamins, herbs, eye drops, creams, and over-the-counter medicines.  Any problems you or family members have had with anesthetic medicines.  Any blood disorders you have.  Any surgeries you have had.  Any medical conditions you have.  Whether you are pregnant or may be pregnant. What happens before the procedure? No special preparation is needed. Eat and drink normally. What happens during the procedure?  In order to produce an image of your heart, gel will be applied to your chest and a wand-like tool (transducer) will be moved over your chest. The gel will help transmit the sound waves from the transducer. The sound waves will harmlessly bounce off your heart to allow the heart images to be captured in real-time motion. These images will then be recorded.  You may need an IV to receive a medicine that improves the quality of the pictures. What happens after the procedure? You may return to your normal schedule including diet, activities, and medicines, unless your  care provider tells you otherwise. This information is not intended to replace advice given to you by your health care provider. Make sure you discuss any questions you have with your health care provider. Document Released: 08/20/2000 Document Revised: 04/10/2016 Document Reviewed:  04/30/2013 Elsevier Interactive Patient Education  2017 Elsevier Inc.    

## 2018-06-23 NOTE — Progress Notes (Signed)
Cardiology Office Note:    Date:  06/23/2018   ID:  Johnathan Bauer, DOB [birth year], MRN [id number]  PCP:  Enid Skeens., MD  Cardiologist:  Jenne Campus, MD    Referring MD: Enid Skeens., MD   Chief Complaint  Patient presents with  . Follow-up  Doing well  History of Present Illness:    Johnathan Bauer is a 67 y.o. male with history of coronary artery disease, chronic atrial fibrillation, history of CVA comes today to my office for follow-up.  He comes alone he does not talk much.  Denies having any chest pain tightness squeezing pressure burning chest no palpitations no dizziness no swelling of lower extremities.  Past Medical History:  Diagnosis Date  . Arthritis    "touch in my right thumb" (12/31/2014)  . Chronic atrial fibrillation    did not like coumadin  . Chronic kidney disease 09/2012  . Coronary artery disease    s/p PCI Sept 2013 with DES OM  . Diverticulitis with perforation   . Pneumonia 09/2012  . Stroke [facility]) 04/2016    Past Surgical History:  Procedure Laterality Date  . COLON SURGERY    . COLOSTOMY REVERSAL  06/06/2013   Dr Rosendo Gros  . COLOSTOMY REVISION  09/02/2012   Procedure: COLON RESECTION SIGMOID;  Surgeon: Serafina Mitchell, MD;  Location: [facility] OR;  Service: Vascular;  Laterality: N/A;  Sigmoid resection with End Colostomy.  . COLOSTOMY TAKEDOWN N/A 06/06/2013   Procedure: LAPAROSCOPIC OSTOMY TAKEDOWN AND ANASTOMOSIS ;  Surgeon: Ralene Ok, MD;  Location: [place];  Service: General;  Laterality: N/A;  . CORONARY ANGIOPLASTY WITH STENT PLACEMENT  06/2012   "1"  . HERNIA REPAIR    . INCISIONAL HERNIA REPAIR  12/31/2014   w/mesh  . INCISIONAL HERNIA REPAIR N/A 12/31/2014   Procedure: OPEN INCISIONAL HERNIA REPAIR WITH MESH (TAR PROCEDURE);  Surgeon: Ralene Ok, MD;  Location: [place];  Service: General;  Laterality: N/A;  . INSERTION OF MESH N/A 12/31/2014   Procedure: INSERTION OF MESH;  Surgeon: Ralene Ok, MD;  Location: [place];   Service: General;  Laterality: N/A;  . LAPAROTOMY  09/02/2012   Procedure: EXPLORATORY LAPAROTOMY;  Surgeon: Serafina Mitchell, MD;  Location: [facility] OR;  Service: Vascular;  Laterality: N/A;  Exploratory Laparotomy with Sigmoid resection with end colocstomy.  . TONSILLECTOMY      Current Medications: Current Meds  Medication Sig  . digoxin (LANOXIN) 0.25 MG tablet TAKE 1 TABLET (250 MCG TOTAL) BY MOUTH DAILY.  . metoprolol tartrate (LOPRESSOR) 50 MG tablet Take 1 tablet (50 mg total) by mouth daily.  . nitroGLYCERIN (NITROSTAT) 0.4 MG SL tablet Place 0.4 mg under the tongue.  . sertraline (ZOLOFT) 50 MG tablet Take 50 mg by mouth daily.  Alveda Reasons 20 MG TABS tablet TAKE 1 TABLET (20 MG TOTAL) BY MOUTH DAILY WITH EVENING MEAL.   Current Facility-Administered Medications for the 06/23/18 encounter (Office Visit) with Park Liter, MD  Medication  . 0.9 %  sodium chloride infusion     Allergies:   Codeine and Poison ivy extract [poison ivy extract]   Social History   Socioeconomic History  . Marital status: Married    Spouse name: Not on file  . Number of children: 2  . Years of education: Not on file  . Highest education level: Not on file  Occupational History  . Occupation: retired  Scientific laboratory technician  . Financial resource strain: Not on file  .  Food insecurity:    Worry: Not on file    Inability: Not on file  . Transportation needs:    Medical: Not on file    Non-medical: Not on file  Tobacco Use  . Smoking status: Current Some Day Smoker    Packs/day: 0.25    Years: 45.00    Pack years: 11.25    Types: Cigarettes  . Smokeless tobacco: Never Used  . Tobacco comment: quit latter part of last year  Substance and Sexual Activity  . Alcohol use: No  . Drug use: No  . Sexual activity: Yes  Lifestyle  . Physical activity:    Days per week: Not on file    Minutes per session: Not on file  . Stress: Not on file  Relationships  . Social connections:    Talks on phone:  Not on file    Gets together: Not on file    Attends religious service: Not on file    Active member of club or organization: Not on file    Attends meetings of clubs or organizations: Not on file    Relationship status: Not on file  Other Topics Concern  . Not on file  Social History Narrative  . Not on file     Family History: The patient's family history includes Lymphoma in his father; Prostate cancer in his paternal grandfather. There is no history of Colon cancer. ROS:   Please see the history of present illness.    All 14 point review of systems negative except as described per history of present illness  EKGs/Labs/Other Studies Reviewed:      Recent Labs: 07/08/2017: ALT 16; BUN 19; Creatinine, Ser 0.98; Hemoglobin 16.4; Platelets 193; Potassium 4.8; Sodium 140; TSH 3.120  Recent Lipid Panel No results found for: CHOL, TRIG, HDL, CHOLHDL, VLDL, LDLCALC, LDLDIRECT  Physical Exam:    VS:  BP 120/64   Pulse 77   Ht 5\' 11"  (1.803 m)   SpO2 95%   BMI 21.67 kg/m     Wt Readings from Last 3 Encounters:  11/15/17 155 lb 6.4 oz (70.5 kg)  08/11/17 158 lb (71.7 kg)  07/08/17 158 lb 1.9 oz (71.7 kg)     GEN:  Well nourished, well developed in no acute distress HEENT: Normal NECK: No JVD; No carotid bruits LYMPHATICS: No lymphadenopathy CARDIAC: RRR, no murmurs, no rubs, no gallops RESPIRATORY:  Clear to auscultation without rales, wheezing or rhonchi  ABDOMEN: Soft, non-tender, non-distended MUSCULOSKELETAL:  No edema; No deformity  SKIN: Warm and dry LOWER EXTREMITIES: no swelling NEUROLOGIC:  Alert and oriented x 3 PSYCHIATRIC:  Normal affect   ASSESSMENT:    1. Arteriosclerotic cardiovascular disease (ASCVD)   2. Chronic atrial fibrillation ([place]): Anticoagulated his chads 2 Vas score equals 3   3. 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 Chronic anticoagulation   5. Alcohol abuse    PLAN:    In order  of problems listed above:  1. Atherosclerotic cardiovascular disease.  Seems to be asymptomatic I will do echocardiogram on him to assess left ventricular ejection fraction. 2. Chronic atrial fibrillation anticoagulated because of chads 2 vascular score equals 4.  We will continue 3. Chronic anticoagulation will continue. 4. Alcohol abuse he tells me that he does not drink anymore.   Medication Adjustments/Labs and Tests Ordered: Current medicines are reviewed at length with the patient today.  Concerns regarding medicines are outlined above.  No orders of the  defined types were placed in this encounter.  Medication changes: No orders of the defined types were placed in this encounter.   Signed, Park Liter, MD, [facility] 06/23/2018 3:41 PM    [facility]

## 2018-06-24 LAB — HEPATIC FUNCTION PANEL
ALT: 14 IU/L (ref 0–44)
AST: 19 IU/L (ref 0–40)
Albumin: 4.5 g/dL (ref 3.6–4.8)
Alkaline Phosphatase: 100 IU/L (ref 39–117)
Bilirubin Total: 1 mg/dL (ref 0.0–1.2)
Bilirubin, Direct: 0.31 mg/dL (ref 0.00–0.40)
Total Protein: 6.9 g/dL (ref 6.0–8.5)

## 2018-06-24 LAB — LIPID PANEL
Chol/HDL Ratio: 2.3 ratio (ref 0.0–5.0)
Cholesterol, Total: 152 mg/dL (ref 100–199)
HDL: 65 mg/dL (ref >39)
LDL Calculated: 70 mg/dL (ref 0–99)
TRIGLYCERIDES: 83 mg/dL (ref 0–149)
VLDL Cholesterol Cal: 17 mg/dL (ref 5–40)

## 2018-07-04 DIAGNOSIS — L600 Ingrowing nail: Secondary | ICD-10-CM | POA: Insufficient documentation

## 2018-07-04 DIAGNOSIS — B351 Tinea unguium: Secondary | ICD-10-CM | POA: Diagnosis not present

## 2018-07-04 HISTORY — DX: Ingrowing nail: L60.0

## 2018-07-12 DIAGNOSIS — Z4889 Encounter for other specified surgical aftercare: Secondary | ICD-10-CM

## 2018-07-12 HISTORY — DX: Encounter for other specified surgical aftercare: Z48.89

## 2018-07-21 ENCOUNTER — Other Ambulatory Visit: Payer: Self-pay

## 2018-07-21 NOTE — Patient Outreach (Signed)
[facility]) Care Management  07/21/2018  NICHOLSON STARACE 1950-12-06 [id number]   Referral Date: 07/21/18 Referral Source: Nurseline Referral Reason: Amoxicillin upsetting stomach   Outreach Attempt: spoke with patient.  He reports that he had dental work done and was given amoxicillin but it is causing some diarrhea. Discussed with patient notifying dentist of problems with antibiotic and possible other antibiotic could be ordered.  Also discussed eating foods such as banana, applesauce, and dry toast to help as well. He verbalized understanding and voices no other concerns.     Plan: RN CM will close case.   Jone Baseman, RN, MSN [facility] Care Management Care Management Coordinator Direct Line [phone] Toll Free: [phone]  Fax: [phone]

## 2018-07-28 ENCOUNTER — Telehealth: Payer: Self-pay | Admitting: Emergency Medicine

## 2018-07-28 NOTE — Telephone Encounter (Signed)
Patient's daughter is asking to have patient set up for therapy since having a stroke. She will reach out to primary care provider for this and call us if she needs anything further.

## 2018-08-01 DIAGNOSIS — J392 Other diseases of pharynx: Secondary | ICD-10-CM | POA: Diagnosis not present

## 2018-08-15 ENCOUNTER — Ambulatory Visit (INDEPENDENT_AMBULATORY_CARE_PROVIDER_SITE_OTHER): Payer: PPO

## 2018-08-15 DIAGNOSIS — I2510 Atherosclerotic heart disease of native coronary artery without angina pectoris: Secondary | ICD-10-CM | POA: Diagnosis not present

## 2018-08-15 NOTE — Progress Notes (Signed)
Complete echocardiogram has been performed.  Jimmy Rayhan Groleau RDCS, RVT 

## 2018-08-16 ENCOUNTER — Telehealth: Payer: Self-pay | Admitting: Emergency Medicine

## 2018-08-16 NOTE — Telephone Encounter (Signed)
Patient called back asking for echo results. Patient informed.

## 2018-08-18 DIAGNOSIS — M25561 Pain in right knee: Secondary | ICD-10-CM | POA: Diagnosis not present

## 2018-09-07 ENCOUNTER — Other Ambulatory Visit: Payer: Self-pay | Admitting: Cardiology

## 2018-10-04 DIAGNOSIS — J209 Acute bronchitis, unspecified: Secondary | ICD-10-CM | POA: Diagnosis not present

## 2018-10-05 ENCOUNTER — Other Ambulatory Visit: Payer: Self-pay

## 2018-10-05 NOTE — Patient Outreach (Signed)
[facility] [facility]) Care Management  10/05/2018  Johnathan Bauer 02-Dec-1950 [id number]  Nurse Call Line Referral Date: 10/05/18 Reason for Referral:  Nurse call line recommendation: emergency room  Telephone call to patient regarding nurse call line referral. HIPAA verified with patient. Explained reason for call.  Patient states he followed up with his primary MD on yesterday.  Patient states his doctor gave him a shot and an albuterol inhaler. He states he is feeling,"ok" today.  Patient reports he uses his inhaler as needed  3-4 times per day. He states he does not have a follow up scheduled.  RNCM advised patient to contact his doctor if needed for increased or new symptoms. Patient verbalized understanding. Patient denied any further needs at this time.   RNCM advised patient to call 24 hour nurse line if needed.  RNCM verified patient aware of 911 services for urgent/ emergent needs.  PLAN; RNCM will close patient due to patient being assessed and having no further needs.   Quinn Plowman RN,BSN,CCM [facility] Telephonic  [phone]     cal

## 2018-10-10 ENCOUNTER — Telehealth: Payer: Self-pay | Admitting: Cardiology

## 2018-10-10 NOTE — Telephone Encounter (Signed)
Patient came by the office and is having dental implants and needs wisdom teeth pulled first, he has questionswhat to do about the Xarelto and if any of his other meds will conflict with anything. Please call

## 2018-10-10 NOTE — Telephone Encounter (Signed)
Patient wife advised for patient to hold xarelto for 48 hours before dental procedure and start back the day after. She verbally understands.

## 2018-10-10 NOTE — Telephone Encounter (Signed)
Can stop Xarelto for 48h

## 2018-10-23 DIAGNOSIS — L570 Actinic keratosis: Secondary | ICD-10-CM | POA: Diagnosis not present

## 2018-10-23 DIAGNOSIS — B351 Tinea unguium: Secondary | ICD-10-CM | POA: Diagnosis not present

## 2018-10-23 DIAGNOSIS — C44629 Squamous cell carcinoma of skin of left upper limb, including shoulder: Secondary | ICD-10-CM | POA: Diagnosis not present

## 2018-11-10 ENCOUNTER — Telehealth: Payer: Self-pay

## 2018-11-10 MED ORDER — DIGOXIN 250 MCG PO TABS: ORAL_TABLET | ORAL | 0 refills | Status: DC

## 2018-11-10 NOTE — Telephone Encounter (Signed)
Refill request from pharmacy faxed to the office for Digoxin. Refill sent in. Patient is due for office visit, and will need to schedule for further refills.

## 2018-12-05 ENCOUNTER — Other Ambulatory Visit: Payer: Self-pay | Admitting: Cardiology

## 2018-12-06 DIAGNOSIS — I671 Cerebral aneurysm, nonruptured: Secondary | ICD-10-CM

## 2018-12-06 HISTORY — DX: Cerebral aneurysm, nonruptured: I67.1

## 2018-12-18 ENCOUNTER — Other Ambulatory Visit: Payer: Self-pay

## 2018-12-18 ENCOUNTER — Other Ambulatory Visit: Payer: Self-pay | Admitting: Cardiology

## 2018-12-18 MED ORDER — METOPROLOL TARTRATE 50 MG PO TABS: 50.0000 mg | ORAL_TABLET | Freq: Every day | ORAL | 0 refills | Status: DC

## 2018-12-18 NOTE — Telephone Encounter (Signed)
° ° ° °  1. Which medications need to be refilled? (please list name of each medication and dose if known) Metoprolol tartrate 50mg  once daily  2. Which pharmacy/location (including street and city if local pharmacy) is medication to be sent to? [facility] [address] [place]  3. Do they need a 30 day or 90 day supply? [place]

## 2018-12-18 NOTE — Telephone Encounter (Signed)
[facility] [address]

## 2018-12-18 NOTE — Patient Outreach (Signed)
[facility]) Care Management  12/18/2018  Johnathan Bauer 12-26-1950 [id number]   Referral Date: 12/18/2018 Referral Source: Nurseline Referral Reason: dizzy spell   Outreach Attempt: spoke with patient. He is able to verify HIPAA. Patient states that he has not had any further dizzy spells.  He checked his blood pressure while on the phone and it was 116/93.  Discussed with patient blood pressure and how it can fluctuate with changing position.  Advised patient to rise, sit and lie down slowly to avoid dizziness.  Also advised patient that if he continues to have problems to contact his PCP.  He verbalized understanding.  Plan: RN CM will close case.     Jone Baseman, RN, MSN [facility] Care Management Care Management Coordinator Direct Line [phone] Toll Free: [phone]  Fax: [phone]

## 2019-01-10 ENCOUNTER — Other Ambulatory Visit: Payer: Self-pay

## 2019-01-10 ENCOUNTER — Emergency Department (HOSPITAL_COMMUNITY)
Admission: EM | Admit: 2019-01-10 | Discharge: 2019-01-11 | Disposition: A | Discharge status: Home / Self Care | Payer: PPO | Attending: Emergency Medicine | Admitting: Emergency Medicine

## 2019-01-10 ENCOUNTER — Encounter (HOSPITAL_COMMUNITY): Payer: Self-pay | Admitting: Emergency Medicine

## 2019-01-10 ENCOUNTER — Telehealth: Payer: Self-pay | Admitting: Cardiology

## 2019-01-10 ENCOUNTER — Emergency Department (HOSPITAL_COMMUNITY): Payer: PPO

## 2019-01-10 DIAGNOSIS — Z7901 Long term (current) use of anticoagulants: Secondary | ICD-10-CM | POA: Insufficient documentation

## 2019-01-10 DIAGNOSIS — I4891 Unspecified atrial fibrillation: Secondary | ICD-10-CM | POA: Diagnosis not present

## 2019-01-10 DIAGNOSIS — R2990 Unspecified symptoms and signs involving the nervous system: Secondary | ICD-10-CM

## 2019-01-10 DIAGNOSIS — Z79899 Other long term (current) drug therapy: Secondary | ICD-10-CM | POA: Insufficient documentation

## 2019-01-10 DIAGNOSIS — N189 Chronic kidney disease, unspecified: Secondary | ICD-10-CM | POA: Insufficient documentation

## 2019-01-10 DIAGNOSIS — H539 Unspecified visual disturbance: Secondary | ICD-10-CM

## 2019-01-10 DIAGNOSIS — Z8673 Personal history of transient ischemic attack (TIA), and cerebral infarction without residual deficits: Secondary | ICD-10-CM

## 2019-01-10 DIAGNOSIS — I671 Cerebral aneurysm, nonruptured: Secondary | ICD-10-CM | POA: Diagnosis not present

## 2019-01-10 DIAGNOSIS — I129 Hypertensive chronic kidney disease with stage 1 through stage 4 chronic kidney disease, or unspecified chronic kidney disease: Secondary | ICD-10-CM | POA: Insufficient documentation

## 2019-01-10 DIAGNOSIS — H538 Other visual disturbances: Secondary | ICD-10-CM

## 2019-01-10 DIAGNOSIS — F17210 Nicotine dependence, cigarettes, uncomplicated: Secondary | ICD-10-CM | POA: Diagnosis not present

## 2019-01-10 DIAGNOSIS — I6990 Unspecified sequelae of unspecified cerebrovascular disease: Secondary | ICD-10-CM | POA: Diagnosis not present

## 2019-01-10 DIAGNOSIS — I482 Chronic atrial fibrillation, unspecified: Secondary | ICD-10-CM | POA: Diagnosis not present

## 2019-01-10 DIAGNOSIS — I2510 Atherosclerotic heart disease of native coronary artery without angina pectoris: Secondary | ICD-10-CM | POA: Insufficient documentation

## 2019-01-10 DIAGNOSIS — I672 Cerebral atherosclerosis: Secondary | ICD-10-CM | POA: Diagnosis not present

## 2019-01-10 DIAGNOSIS — I6503 Occlusion and stenosis of bilateral vertebral arteries: Secondary | ICD-10-CM | POA: Diagnosis not present

## 2019-01-10 DIAGNOSIS — I6523 Occlusion and stenosis of bilateral carotid arteries: Secondary | ICD-10-CM | POA: Diagnosis not present

## 2019-01-10 HISTORY — DX: Unspecified symptoms and signs involving the nervous system: R29.90

## 2019-01-10 HISTORY — DX: Other visual disturbances: H53.8

## 2019-01-10 HISTORY — DX: Personal history of transient ischemic attack (TIA), and cerebral infarction without residual deficits: Z86.73

## 2019-01-10 LAB — COMPREHENSIVE METABOLIC PANEL
ALT: 20 U/L (ref 0–44)
AST: 26 U/L (ref 15–41)
Albumin: 3.3 g/dL — ABNORMAL LOW (ref 3.5–5.0)
Alkaline Phosphatase: 66 U/L (ref 38–126)
Anion gap: 11 (ref 5–15)
BUN: 11 mg/dL (ref 8–23)
CO2: 24 mmol/L (ref 22–32)
Calcium: 8.7 mg/dL — ABNORMAL LOW (ref 8.9–10.3)
Chloride: 100 mmol/L (ref 98–111)
Creatinine, Ser: 1.01 mg/dL (ref 0.61–1.24)
GFR calc Af Amer: >60 mL/min (ref >60)
GFR calc non Af Amer: >60 mL/min (ref >60)
Glucose, Bld: 92 mg/dL (ref 70–99)
Potassium: 4.4 mmol/L (ref 3.5–5.1)
Sodium: 135 mmol/L (ref 135–145)
Total Bilirubin: 0.9 mg/dL (ref 0.3–1.2)
Total Protein: 5.8 g/dL — ABNORMAL LOW (ref 6.5–8.1)

## 2019-01-10 LAB — DIFFERENTIAL
Abs Immature Granulocytes: 0.04 10*3/uL (ref 0.00–0.07)
Basophils Absolute: 0 10*3/uL (ref 0.0–0.1)
Basophils Relative: 0 %
Eosinophils Absolute: 0.2 10*3/uL (ref 0.0–0.5)
Eosinophils Relative: 4 %
Immature Granulocytes: 1 %
Lymphocytes Relative: 35 %
Lymphs Abs: 2 10*3/uL (ref 0.7–4.0)
Monocytes Absolute: 0.8 10*3/uL (ref 0.1–1.0)
Monocytes Relative: 14 %
Neutro Abs: 2.6 10*3/uL (ref 1.7–7.7)
Neutrophils Relative %: 46 %

## 2019-01-10 LAB — CBC
HCT: 46.1 % (ref 39.0–52.0)
Hemoglobin: 15.7 g/dL (ref 13.0–17.0)
MCH: 34.6 pg — ABNORMAL HIGH (ref 26.0–34.0)
MCHC: 34.1 g/dL (ref 30.0–36.0)
MCV: 101.5 fL — ABNORMAL HIGH (ref 80.0–100.0)
Platelets: 161 10*3/uL (ref 150–400)
RBC: 4.54 MIL/uL (ref 4.22–5.81)
RDW: 13.2 % (ref 11.5–15.5)
WBC: 5.7 10*3/uL (ref 4.0–10.5)
nRBC: 0 % (ref 0.0–0.2)

## 2019-01-10 LAB — APTT: aPTT: 36 seconds (ref 24–36)

## 2019-01-10 LAB — PROTIME-INR
INR: 1.2 (ref 0.8–1.2)
Prothrombin Time: 15.2 seconds (ref 11.4–15.2)

## 2019-01-10 MED ORDER — IOHEXOL 350 MG/ML SOLN
100.0000 mL | Freq: Once | INTRAVENOUS | Status: AC | PRN
  Administered 2019-01-10: 100 mL via INTRAVENOUS

## 2019-01-10 NOTE — Telephone Encounter (Signed)
Patient spouse is on phone about patient having vision problems, and eye dr said he had stroke in eye

## 2019-01-10 NOTE — Telephone Encounter (Signed)
Patient's wife, Wells Guiles, per DPR called with concern about patient. She explained he had some vision changes last night. Patient has been holding xarelto as of yesterday as he is scheduled to have 6 dental implants and 7 teeth extracted tomorrow, 01/11/2019. Patient went to the eye doctor today who advised that the "issue was stroke related." Wells Guiles reports that the eye doctor has referred patient to another doctor but could not give any additional information as she was not with the patient.   Called patient who was driving and needed to look at his discharge paperwork to give me information regarding the referral that was placed. Patient will call me back when he gets home to discuss further.   Wells Guiles is concerned and wants to know "Is this anything heart related? Should the patient stay off xarelto and have his dental procedure as scheduled tomorrow?"  Will have Dr. Agustin Cree advise once I have all the information needed.

## 2019-01-10 NOTE — Addendum Note (Signed)
Addended by: Austin Miles on: 01/10/2019 03:48 PM   Modules accepted: Orders

## 2019-01-10 NOTE — ED Notes (Signed)
Patient transported to CT 

## 2019-01-10 NOTE — ED Notes (Signed)
[place], Wife

## 2019-01-10 NOTE — ED Notes (Signed)
Wife, rebecca, would like an update as soon as possible at [phone]

## 2019-01-10 NOTE — ED Triage Notes (Signed)
Pt reports last night he felt like "someone put cellophane over my left eye." Pt reports it was still there this morning and then he noticed "a curtain" to the peripheral vision to his L eye. Pt reports being seen at eye doctor, had MRI today at [facility] and was told he had an "aneursym with a small amount leaking." Pt reports changes to his vision have resolved. Pt reports hx of stroke. Pt denies pain.

## 2019-01-10 NOTE — ED Provider Notes (Signed)
[facility] EMERGENCY DEPARTMENT Provider Note   CSN: [id number] Arrival date & time: 01/10/19  4854    History   Chief Complaint Chief Complaint  Patient presents with   Visual Field Change   Abnormal MRI    HPI Johnathan Bauer is a 68 y.o. male.     The history is provided by the patient and medical records. No language interpreter was used.   Johnathan Bauer is a 68 y.o. male who presents to the Emergency Department complaining of vision changes, abnormal CT scan. Yesterday he felt like a curtain was falling down over his left eye and took out a third of his vision. He complained of blurred vision at the time. His symptoms persisted today and he went to his ophthalmologist, who lifted his eyes and stated that they were physically fine. He was referred to [facility] for a CT scan of his head. He was contacted and told to CT scan was abnormal and he needs to go to the hospital for further evaluation. He currently states that his vision is back to normal. He takes Xarelto for history of atrial fibrillation. His last dose was on Monday due to planned dental implant surgery tomorrow.  Records reviewed in epic. CT scan without contrast of the brain was performed at [facility] earlier today. Imaging shows a 6 mm hypertensive focus along the left anterior lateral aspect of the super seller cistern, which is likely focus of acute hemorrhage.   Past Medical History:  Diagnosis Date   Arthritis    "touch in my right thumb" (12/31/2014)   Chronic atrial fibrillation    did not like coumadin   Chronic kidney disease 09/2012   Coronary artery disease    s/p PCI Sept 2013 with DES OM   Diverticulitis with perforation    Pneumonia 09/2012   Stroke ([place]) 04/2016    Patient Active Problem List   Diagnosis Date Noted   Vision changes 01/10/2019   Stroke-like symptoms 01/10/2019   History of stroke 01/10/2019   Blurry vision, bilateral  01/10/2019   Dyspnea on exertion 07/08/2017   History of colonic polyps 11/30/2016   Chronic anticoagulation 11/30/2016   Alcohol abuse 05/25/2016   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 05/25/2016   Coronary artery disease involving native coronary artery of native heart without angina pectoris 05/20/2015   Essential hypertension 05/20/2015   S/P hernia repair 12/31/2014   Respiratory failure, post-operative ([place]) 09/07/2012   NSVT, 7 beat run 1/02 09/07/2012   Chronic atrial fibrillation ([place]): Anticoagulated his chads 2 Vas score equals 3 09/03/2012   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   COLOSTOMY TAKEDOWN N/A 06/06/2013   Procedure: LAPAROSCOPIC OSTOMY TAKEDOWN AND ANASTOMOSIS ;  Surgeon: Ralene Ok, MD;  Location: [place];  Service: General;  Laterality: N/A;   CORONARY ANGIOPLASTY WITH STENT PLACEMENT  06/2012   "1"   [place]  12/31/2014   w/mesh   INCISIONAL HERNIA REPAIR N/A 12/31/2014  Procedure: OPEN INCISIONAL HERNIA REPAIR WITH MESH (TAR PROCEDURE);  Surgeon: Ralene Ok, MD;  Location: [place];  Service: General;  Laterality: N/A;   INSERTION OF MESH N/A 12/31/2014   Procedure: INSERTION OF MESH;  Surgeon: Ralene Ok, MD;  Location: [place];  Service: General;  Laterality: N/A;   LAPAROTOMY  09/02/2012   Procedure: EXPLORATORY LAPAROTOMY;  Surgeon: Serafina Mitchell, MD;  Location: [facility] OR;  Service: Vascular;  Laterality: N/A;  Exploratory  Laparotomy with Sigmoid resection with end colocstomy.   TONSILLECTOMY          Home Medications    Prior to Admission medications   Medication Sig Start Date End Date Taking? Authorizing Provider  digoxin (LANOXIN) 0.25 MG tablet TAKE 1 TABLET (250 MCG TOTAL) BY MOUTH DAILY. 11/10/18   Park Liter, MD  metoprolol tartrate (LOPRESSOR) 50 MG tablet Take 1 tablet (50 mg total) by mouth daily. 12/18/18   Park Liter, MD  nitroGLYCERIN (NITROSTAT) 0.4 MG SL tablet Place 0.4 mg under the tongue.    [provider]  sertraline (ZOLOFT) 50 MG tablet Take 50 mg by mouth daily.    [provider]    Family History Family History  Problem Relation Age of Onset   Lymphoma Father    Prostate cancer Paternal Grandfather    Colon cancer Neg Hx     Social History Social History   Tobacco Use   Smoking status: Current Every Day Smoker    Packs/day: 0.25    Years: 45.00    Pack years: 11.25    Types: Cigarettes   Smokeless tobacco: Never Used   Tobacco comment: quit latter part of last year  Substance Use Topics   Alcohol use: No   Drug use: No     Allergies   Codeine and Poison ivy extract [poison ivy extract]   Review of Systems Review of Systems  All other systems reviewed and are negative.    Physical Exam Updated Vital Signs BP (!) 129/95    Pulse 79    Temp 97.8 F (36.6 C) (Oral)    Resp 13    Ht 6' (1.829 m)    Wt 70.3 kg    SpO2 99%    BMI 21.02 kg/m   Physical Exam Vitals signs and nursing note reviewed.  Constitutional:      Appearance: He is well-developed.  HENT:     Head: Normocephalic and atraumatic.  Cardiovascular:     Rate and Rhythm: Normal rate and regular rhythm.     Heart sounds: No murmur.  Pulmonary:     Effort: Pulmonary effort is normal. No respiratory distress.     Breath sounds: Normal breath sounds.  Abdominal:     Palpations: Abdomen is soft.     Tenderness: There is no abdominal tenderness.  There is no guarding or rebound.  Musculoskeletal:        General: No tenderness.  Skin:    General: Skin is warm and dry.  Neurological:     Mental Status: He is alert and oriented to person, place, and time.     Comments: Pupils equal round and reactive. Visual fields are grossly intact. Five out of five strength in all four extremities with sensation to light touch intact in all four extremities. No pronator drift. No asymmetry of facial muscles.  Psychiatric:        Behavior: Behavior normal.      ED Treatments / Results  Labs (all labs  ordered are listed, but only abnormal results are displayed) Labs Reviewed  CBC - Abnormal; Notable for the following components:      Result Value   MCV 101.5 (*)    MCH 34.6 (*)    All other components within normal limits  COMPREHENSIVE METABOLIC PANEL - Abnormal; Notable for the following components:   Calcium 8.7 (*)    Total Protein 5.8 (*)    Albumin 3.3 (*)    All other components within normal limits  PROTIME-INR  APTT  DIFFERENTIAL    EKG EKG Interpretation  Date/Time:  Wednesday Jan 10 2019 19:00:41 EDT Ventricular Rate:  76 PR Interval:    QRS Duration: 89 QT Interval:  383 QTC Calculation: 431 R Axis:   -23 Text Interpretation:  Atrial fibrillation Borderline left axis deviation Probable lateral infarct, old Probable anteroseptal infarct, old Confirmed by Quintella Reichert [phone]) on 01/10/2019 7:18:14 PM   Radiology Ct Angio Head W/cm &/or Wo Cm  Result Date: 01/10/2019 CLINICAL DATA:  Left eye visual loss, now resolved. EXAM: CT ANGIOGRAPHY HEAD AND NECK TECHNIQUE: Multidetector CT imaging of the head and neck was performed using the standard protocol during bolus administration of intravenous contrast. Multiplanar CT image reconstructions and MIPs were obtained to evaluate the vascular anatomy. Carotid stenosis measurements (when applicable) are obtained utilizing NASCET criteria, using the distal internal carotid  diameter as the denominator. CONTRAST:  166mL OMNIPAQUE IOHEXOL 350 MG/ML SOLN COMPARISON:  Head CT 01/10/2019. Head MRI 05/17/2016. Head and neck CTA 05/17/2016. FINDINGS: CT HEAD FINDINGS Brain: A 7 mm hyperdense focus anteriorly in the left suprasellar cistern is unchanged from today's earlier CT. There is no evidence of interval intracranial hemorrhage. No acute infarct, mass/mass effect, or extra-axial fluid collection is identified. There is a chronic right MCA infarct. Cerebral white matter hypodensities bilaterally are nonspecific but compatible with mild chronic small vessel ischemic disease. There is mild-to-moderate cerebral atrophy. Vascular: Calcified atherosclerosis at the skull base. Skull: No fracture or focal osseous lesion. Sinuses: Paranasal sinuses and mastoid air cells are clear. Orbits: Unremarkable. Review of the MIP images confirms the above findings CTA NECK FINDINGS Aortic arch: Standard 3 vessel aortic arch with mild atherosclerotic plaque. Plaque in the brachiocephalic and subclavian arteries without significant stenosis. Right carotid system: Patent with mild-to-moderate calcified and soft plaque about the carotid bifurcation not resulting in significant stenosis. Left carotid system: Patent with predominantly calcified plaque about the carotid bifurcation without associated common or internal carotid artery stenosis. Mild proximal ECA stenosis. Vertebral arteries: The vertebral arteries are patent. The left vertebral artery is diffusely small in caliber which reflects a change from the prior CTA, and there are moderate to severe vertebral artery origin stenoses bilaterally. A discrete dissection is not evident. Skeleton: Moderate to severe cervical disc degeneration. Other neck: No mass or enlarged lymph nodes identified. Upper chest: Partially visualized thin walled cyst in the right upper lobe medially. Review of the MIP images confirms the above findings CTA HEAD FINDINGS Anterior  circulation: The internal carotid arteries are patent from skull base to carotid termini with cavernous and supraclinoid segment stenoses which are mild on the right and mild-to-moderate on the left. There is a small bulge projecting superiorly from the left ICA in the ophthalmic region likely reflecting the neck of an aneurysm. Faint contrast extending more superiorly from this to the hyperdense focus described on earlier noncontrast CT which does not demonstrate substantial enhancement, is estimated to measure 9 x 7 mm on CTA images (series  7, image 269), and likely represents a thrombosed aneurysm. ACAs and MCAs are patent with atherosclerotic irregularity primarily involving the branch vessels without evidence of proximal branch occlusion or flow limiting proximal stenosis. There is a mild-to-moderate left A1 stenosis. Posterior circulation: The intracranial vertebral arteries are patent to the basilar. The basilar artery is patent and small in caliber diffusely on a developmental basis. There are fetal origins of both PCAs with branch vessel irregularity and attenuation bilaterally but no evidence of significant proximal PCA stenosis. No aneurysm is identified. Venous sinuses: Patent. Anatomic variants: Fetal origin of the PCAs. Delayed phase: No abnormal enhancement. Review of the MIP images confirms the above findings IMPRESSION: 1. 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 2. Intracranial atherosclerosis with mild-to-moderate ICA stenoses. 3. Moderate to severe bilateral vertebral artery origin stenoses. Diffusely decreased caliber of the left vertebral artery compared to 2017. 4.  Aortic Atherosclerosis (ICD10-I70.0). Electronically Signed   By: Logan Bores M.D.   On: 01/10/2019 21:47   Ct Angio Neck W And/or Wo Contrast  Result Date: 01/10/2019 CLINICAL DATA:  Left eye visual loss, now resolved. EXAM: CT  ANGIOGRAPHY HEAD AND NECK TECHNIQUE: Multidetector CT imaging of the head and neck was performed using the standard protocol during bolus administration of intravenous contrast. Multiplanar CT image reconstructions and MIPs were obtained to evaluate the vascular anatomy. Carotid stenosis measurements (when applicable) are obtained utilizing NASCET criteria, using the distal internal carotid diameter as the denominator. CONTRAST:  139mL OMNIPAQUE IOHEXOL 350 MG/ML SOLN COMPARISON:  Head CT 01/10/2019. Head MRI 05/17/2016. Head and neck CTA 05/17/2016. FINDINGS: CT HEAD FINDINGS Brain: A 7 mm hyperdense focus anteriorly in the left suprasellar cistern is unchanged from today's earlier CT. There is no evidence of interval intracranial hemorrhage. No acute infarct, mass/mass effect, or extra-axial fluid collection is identified. There is a chronic right MCA infarct. Cerebral white matter hypodensities bilaterally are nonspecific but compatible with mild chronic small vessel ischemic disease. There is mild-to-moderate cerebral atrophy. Vascular: Calcified atherosclerosis at the skull base. Skull: No fracture or focal osseous lesion. Sinuses: Paranasal sinuses and mastoid air cells are clear. Orbits: Unremarkable. Review of the MIP images confirms the above findings CTA NECK FINDINGS Aortic arch: Standard 3 vessel aortic arch with mild atherosclerotic plaque. Plaque in the brachiocephalic and subclavian arteries without significant stenosis. Right carotid system: Patent with mild-to-moderate calcified and soft plaque about the carotid bifurcation not resulting in significant stenosis. Left carotid system: Patent with predominantly calcified plaque about the carotid bifurcation without associated common or internal carotid artery stenosis. Mild proximal ECA stenosis. Vertebral arteries: The vertebral arteries are patent. The left vertebral artery is diffusely small in caliber which reflects a change from the prior CTA, and  there are moderate to severe vertebral artery origin stenoses bilaterally. A discrete dissection is not evident. Skeleton: Moderate to severe cervical disc degeneration. Other neck: No mass or enlarged lymph nodes identified. Upper chest: Partially visualized thin walled cyst in the right upper lobe medially. Review of the MIP images confirms the above findings CTA HEAD FINDINGS Anterior circulation: The internal carotid arteries are patent from skull base to carotid termini with cavernous and supraclinoid segment stenoses which are mild on the right and mild-to-moderate on the left. There is a small bulge projecting superiorly from the left ICA in the ophthalmic region likely reflecting the neck of an aneurysm. Faint contrast extending more superiorly from this to the  hyperdense focus described on earlier noncontrast CT which does not demonstrate substantial enhancement, is estimated to measure 9 x 7 mm on CTA images (series 7, image 269), and likely represents a thrombosed aneurysm. ACAs and MCAs are patent with atherosclerotic irregularity primarily involving the branch vessels without evidence of proximal branch occlusion or flow limiting proximal stenosis. There is a mild-to-moderate left A1 stenosis. Posterior circulation: The intracranial vertebral arteries are patent to the basilar. The basilar artery is patent and small in caliber diffusely on a developmental basis. There are fetal origins of both PCAs with branch vessel irregularity and attenuation bilaterally but no evidence of significant proximal PCA stenosis. No aneurysm is identified. Venous sinuses: Patent. Anatomic variants: Fetal origin of the PCAs. Delayed phase: No abnormal enhancement. Review of the MIP images confirms the above findings IMPRESSION: 1. 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 2. Intracranial atherosclerosis with mild-to-moderate  ICA stenoses. 3. Moderate to severe bilateral vertebral artery origin stenoses. Diffusely decreased caliber of the left vertebral artery compared to 2017. 4.  Aortic Atherosclerosis (ICD10-I70.0). Electronically Signed   By: Logan Bores M.D.   On: 01/10/2019 21:47    Procedures Procedures (including critical care time)  Medications Ordered in ED Medications  iohexol (OMNIPAQUE) 350 MG/ML injection 100 mL (100 mLs Intravenous Contrast Given 01/10/19 2051)     Initial Impression / Assessment and Plan / ED Course  I have reviewed the triage vital signs and the nursing notes.  Pertinent labs & imaging results that were available during my care of the patient were reviewed by me and considered in my medical decision making (see chart for details).        Patient here for evaluation after CT scan demonstrated possible aneurysm. He is well appearing on evaluation with no focal neurologic deficits. His vision changes have resolved. CTA here demonstrates ICA aneurysm with thrombosis. Discussed with Dr. Ronnald Ramp with neurosurgery. Plan to have the patient seen by Dr. Kathyrn Sheriff tomorrow or the next day. We will continue to hold his Xarelto and start him on aspirin daily. Discussed with patient importance of outpatient follow-up as well as return precautions. Discussed with patient that he needs to cancel his oral surgery.  Final Clinical Impressions(s) / ED Diagnoses   Final diagnoses:  Intracranial aneurysm    ED Discharge Orders    None       Quintella Reichert, MD 01/10/19 2335

## 2019-01-10 NOTE — Telephone Encounter (Signed)
Patient reports that he saw Dr. Gilford Rile today who referred him to see Dr. Alanda Slim with [facility] in [place].   Ct head wo contrast resume xarelto and wait 1-2 month b4 dental work

## 2019-01-10 NOTE — Discharge Instructions (Signed)
Stop taking your Xarelto for now - ask the Neurosurgeon when you should restart it.  Start taking Aspirin 325 mg daily.  Get rechecked immediately if you have any new or concerning symptoms.

## 2019-01-10 NOTE — Telephone Encounter (Addendum)
Phoned patient not having realized that patient had spoken with Robyne Peers RN. Additional information obtained from patient: current BP 100/81 seated, HR 80, weight stable at 155# Denies irregular beats and denies chest discomfort, swelling, headache or  increased shortness of breath.  He has mild shortness of breath at baseline.     Will forward additional information to Dr. Agustin Cree.

## 2019-01-10 NOTE — Telephone Encounter (Signed)
After discussing with Dr. Agustin Cree, he advised for patient to have a STAT

## 2019-01-12 DIAGNOSIS — I671 Cerebral aneurysm, nonruptured: Secondary | ICD-10-CM | POA: Diagnosis not present

## 2019-01-15 ENCOUNTER — Other Ambulatory Visit: Payer: Self-pay | Admitting: Cardiology

## 2019-01-15 ENCOUNTER — Other Ambulatory Visit (HOSPITAL_COMMUNITY): Payer: Self-pay | Admitting: Neurosurgery

## 2019-01-15 DIAGNOSIS — I729 Aneurysm of unspecified site: Secondary | ICD-10-CM

## 2019-01-15 MED ORDER — NITROGLYCERIN 0.4 MG SL SUBL: 0.4000 mg | SUBLINGUAL_TABLET | SUBLINGUAL | 5 refills | Status: DC | PRN

## 2019-01-15 NOTE — Telephone Encounter (Signed)
°*  STAT* If patient is at the pharmacy, call can be transferred to refill team.   1. Which medications need to be refilled? (please list name of each medication and dose if known) Nitroglycerin 0.4mg  sl  2. Which pharmacy/location (including street and city if local pharmacy) is medication to be sent to? [facility] on [address]  3. Do they need a 30 day or 90 day supply? [place]

## 2019-01-15 NOTE — Telephone Encounter (Signed)
Refills sent in

## 2019-01-16 ENCOUNTER — Other Ambulatory Visit: Payer: Self-pay | Admitting: Neurosurgery

## 2019-01-17 ENCOUNTER — Other Ambulatory Visit: Payer: Self-pay | Admitting: Student

## 2019-01-17 ENCOUNTER — Other Ambulatory Visit (HOSPITAL_COMMUNITY)
Admission: RE | Admit: 2019-01-17 | Discharge: 2019-01-17 | Disposition: A | Discharge status: Home / Self Care | Payer: PPO | Source: Ambulatory Visit | Attending: Neurosurgery | Admitting: Neurosurgery

## 2019-01-17 ENCOUNTER — Other Ambulatory Visit: Payer: Self-pay

## 2019-01-17 DIAGNOSIS — Z1159 Encounter for screening for other viral diseases: Secondary | ICD-10-CM | POA: Insufficient documentation

## 2019-01-17 LAB — SARS CORONAVIRUS 2 BY RT PCR (HOSPITAL ORDER, PERFORMED IN ~~LOC~~ HOSPITAL LAB): SARS Coronavirus 2: NEGATIVE

## 2019-01-17 NOTE — H&P (Signed)
Chief Complaint   Intracranial aneurysm   HPI   HPI: Johnathan Bauer is a 68 y.o. male with recently discovered possible intracranial aneurysm during work up for changes in left eye vision, consistent with amaurosis fugax after being off Xarelto for several days days. 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  He is without any concerns.  Of note, the patient does have a history of hypertension for which she is medically controlled, and smokes approximately 1 pack of cigarettes every week.  There is no known family history of intracranial aneurysms or hemorrhage.   Patient Active Problem List   Diagnosis Date Noted  . Vision changes 01/10/2019  . Stroke-like symptoms 01/10/2019  . History of stroke 01/10/2019  . Blurry vision, bilateral 01/10/2019  . Dyspnea on exertion 07/08/2017  . History of colonic polyps 11/30/2016  . Chronic anticoagulation 11/30/2016  . Alcohol abuse 05/25/2016  . 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 Coronary artery disease involving native coronary artery of native heart without angina pectoris 05/20/2015  . Essential hypertension 05/20/2015  . S/P hernia repair 12/31/2014  . Respiratory failure, post-operative ([place]) 09/07/2012  . NSVT, 7 beat run 1/02 09/07/2012  . Chronic atrial fibrillation ([place]): Anticoagulated his chads 2 Vas score equals 3 09/03/2012  . 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 Perforated sigmoid colon ([place]) 09/03/2012  . Diverticulitis of sigmoid colon 09/02/2012    PMH: Past Medical History:  Diagnosis Date  . Arthritis    "touch in my right thumb" (12/31/2014)  . Chronic atrial fibrillation    did not like coumadin  . Chronic kidney disease 09/2012  . Coronary artery disease    s/p PCI Sept 2013 with DES OM  . Diverticulitis with  perforation   . Pneumonia 09/2012  . Stroke [facility]) 04/2016    PSH: Past Surgical History:  Procedure Laterality Date  . COLON SURGERY    . COLOSTOMY REVERSAL  06/06/2013   Dr Rosendo Gros  . COLOSTOMY REVISION  09/02/2012   Procedure: COLON RESECTION SIGMOID;  Surgeon: Serafina Mitchell, MD;  Location: [facility] OR;  Service: Vascular;  Laterality: N/A;  Sigmoid resection with End Colostomy.  . COLOSTOMY TAKEDOWN N/A 06/06/2013   Procedure: LAPAROSCOPIC OSTOMY TAKEDOWN AND ANASTOMOSIS ;  Surgeon: Ralene Ok, MD;  Location: [place];  Service: General;  Laterality: N/A;  . CORONARY ANGIOPLASTY WITH STENT PLACEMENT  06/2012   "1"  . HERNIA REPAIR    . INCISIONAL HERNIA REPAIR  12/31/2014   w/mesh  . INCISIONAL HERNIA REPAIR N/A 12/31/2014   Procedure: OPEN INCISIONAL HERNIA REPAIR WITH MESH (TAR PROCEDURE);  Surgeon: Ralene Ok, MD;  Location: [place];  Service: General;  Laterality: N/A;  . INSERTION OF MESH N/A 12/31/2014   Procedure: INSERTION OF MESH;  Surgeon: Ralene Ok, MD;  Location: [place];  Service: General;  Laterality: N/A;  . LAPAROTOMY  09/02/2012   Procedure: EXPLORATORY LAPAROTOMY;  Surgeon: Serafina Mitchell, MD;  Location: [facility] OR;  Service: Vascular;  Laterality: N/A;  Exploratory Laparotomy with Sigmoid resection with end colocstomy.  . TONSILLECTOMY      (Not in a hospital admission)   SH: Social History   Tobacco Use  . Smoking status: Current Every Day Smoker    Packs/day: 0.25    Years: 45.00    Pack years: 11.25    Types: Cigarettes  . Smokeless tobacco:  Never Used  . Tobacco comment: quit latter part of last year  Substance Use Topics  . Alcohol use: No  . Drug use: No    MEDS: Prior to Admission medications   Medication Sig Start Date End Date Taking? Authorizing Provider  digoxin (LANOXIN) 0.25 MG tablet TAKE 1 TABLET (250 MCG TOTAL) BY MOUTH DAILY. 11/10/18   Park Liter, MD  metoprolol tartrate (LOPRESSOR) 50 MG tablet Take 1 tablet (50 mg total)  by mouth daily. 12/18/18   Park Liter, MD  nitroGLYCERIN (NITROSTAT) 0.4 MG SL tablet Place 1 tablet (0.4 mg total) under the tongue every 5 (five) minutes as needed for chest pain. 01/15/19   Park Liter, MD  sertraline (ZOLOFT) 50 MG tablet Take 50 mg by mouth daily.    [provider]    ALLERGY: Allergies  Allergen Reactions  . Codeine Nausea And Vomiting  . Poison Ivy Extract [Poison Ivy Extract] Hives    Social History   Tobacco Use  . Smoking status: Current Every Day Smoker    Packs/day: 0.25    Years: 45.00    Pack years: 11.25    Types: Cigarettes  . Smokeless tobacco: Never Used  . Tobacco comment: quit latter part of last year  Substance Use Topics  . Alcohol use: No     Family History  Problem Relation Age of Onset  . Lymphoma Father   . Prostate cancer Paternal Grandfather   . Colon cancer Neg Hx      ROS   ROS  Exam   There were no vitals filed for this visit. General appearance: WDWN, NAD Eyes: No scleral injection Cardiovascular: Regular rate and rhythm without murmurs, rubs, gallops. No edema or variciosities. Distal pulses normal. Pulmonary: Effort normal, non-labored breathing Musculoskeletal:     Muscle tone upper extremities: Normal    Muscle tone lower extremities: Normal    Motor exam: Upper Extremities Deltoid Bicep Tricep Grip  Right 5/5 5/5 5/5 5/5  Left 5/5 5/5 5/5 5/5   Lower Extremity IP Quad PF DF EHL  Right 5/5 5/5 5/5 5/5 5/5  Left 5/5 5/5 5/5 5/5 5/5   Neurological Mental Status:    - Patient is awake, alert, oriented to person, place, month, year, and situation    - Patient is able to give a clear and coherent history.    - No signs of aphasia or neglect Cranial Nerves    - II: Visual Fields are full. PERRL    - III/IV/VI: EOMI without ptosis or diploplia.     - V: Facial sensation is grossly normal    - VII: Facial movement is symmetric.     - VIII: hearing is intact to voice    - X: Uvula  elevates symmetrically    - XI: Shoulder shrug is symmetric.    - XII: tongue is midline without atrophy or fasciculations.  Sensory: Sensation grossly intact to LT  Results - Imaging/Labs   No results found for this or any previous visit (from the past 48 hour(s)).  No results found.  IMAGING: CT and CT angiogram of the head and neck dated 01/10/2019 was reviewed.  Noncontrast study does demonstrate a hyperdensity in the right side of the suprasellar cistern also present on the CT angiogram suggesting thrombus.  There does appear to be a small aneurysm neck on the angiogram arising from the supraclinoid segment of the left internal carotid artery.  Previous MRI dated 05/17/2016 was also reviewed.  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 male with left amaurosis fugax type symptoms after being off his Xarelto for a few days.  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    Given the size of the aneurysm and its anatomic relationship with the left ophthalmic artery it will likely need to be treated.   He presents today for diagnostic cerebral angiogram  For further characterization of the aneurysm prior to treatment.Marland Kitchen  He is without any concerns.  While in the office risks, benefits and alternatives of the procedure were discussed.  Patient stated in own language understanding and wished to proceed.

## 2019-01-18 ENCOUNTER — Encounter (HOSPITAL_COMMUNITY): Payer: Self-pay | Admitting: Neurosurgery

## 2019-01-18 ENCOUNTER — Ambulatory Visit (HOSPITAL_COMMUNITY)
Admission: RE | Admit: 2019-01-18 | Discharge: 2019-01-18 | Disposition: A | Discharge status: Home / Self Care | Payer: PPO | Source: Ambulatory Visit | Attending: Neurosurgery | Admitting: Neurosurgery

## 2019-01-18 ENCOUNTER — Other Ambulatory Visit (HOSPITAL_COMMUNITY): Payer: Self-pay | Admitting: Neurosurgery

## 2019-01-18 DIAGNOSIS — I729 Aneurysm of unspecified site: Secondary | ICD-10-CM

## 2019-01-18 DIAGNOSIS — Z8673 Personal history of transient ischemic attack (TIA), and cerebral infarction without residual deficits: Secondary | ICD-10-CM | POA: Diagnosis not present

## 2019-01-18 DIAGNOSIS — I2510 Atherosclerotic heart disease of native coronary artery without angina pectoris: Secondary | ICD-10-CM | POA: Insufficient documentation

## 2019-01-18 DIAGNOSIS — Z955 Presence of coronary angioplasty implant and graft: Secondary | ICD-10-CM | POA: Insufficient documentation

## 2019-01-18 DIAGNOSIS — F17210 Nicotine dependence, cigarettes, uncomplicated: Secondary | ICD-10-CM | POA: Diagnosis not present

## 2019-01-18 DIAGNOSIS — N189 Chronic kidney disease, unspecified: Secondary | ICD-10-CM | POA: Diagnosis not present

## 2019-01-18 DIAGNOSIS — Z885 Allergy status to narcotic agent status: Secondary | ICD-10-CM | POA: Insufficient documentation

## 2019-01-18 DIAGNOSIS — Z1159 Encounter for screening for other viral diseases: Secondary | ICD-10-CM | POA: Insufficient documentation

## 2019-01-18 DIAGNOSIS — Z79899 Other long term (current) drug therapy: Secondary | ICD-10-CM | POA: Insufficient documentation

## 2019-01-18 DIAGNOSIS — Z7901 Long term (current) use of anticoagulants: Secondary | ICD-10-CM | POA: Insufficient documentation

## 2019-01-18 DIAGNOSIS — I129 Hypertensive chronic kidney disease with stage 1 through stage 4 chronic kidney disease, or unspecified chronic kidney disease: Secondary | ICD-10-CM | POA: Diagnosis not present

## 2019-01-18 DIAGNOSIS — H538 Other visual disturbances: Secondary | ICD-10-CM | POA: Insufficient documentation

## 2019-01-18 DIAGNOSIS — G453 Amaurosis fugax: Secondary | ICD-10-CM | POA: Insufficient documentation

## 2019-01-18 DIAGNOSIS — I482 Chronic atrial fibrillation, unspecified: Secondary | ICD-10-CM | POA: Insufficient documentation

## 2019-01-18 DIAGNOSIS — I671 Cerebral aneurysm, nonruptured: Secondary | ICD-10-CM | POA: Diagnosis not present

## 2019-01-18 HISTORY — PX: IR 3D INDEPENDENT WKST: IMG2385

## 2019-01-18 HISTORY — PX: IR ANGIOGRAM EXTREMITY LEFT: IMG651

## 2019-01-18 HISTORY — PX: IR ANGIO INTRA EXTRACRAN SEL INTERNAL CAROTID BILAT MOD SED: IMG5363

## 2019-01-18 MED ORDER — HEPARIN SODIUM (PORCINE) 1000 UNIT/ML IJ SOLN
INTRAMUSCULAR | Status: AC
  Filled 2019-01-18: qty 1

## 2019-01-18 MED ORDER — FENTANYL CITRATE (PF) 100 MCG/2ML IJ SOLN
INTRAMUSCULAR | Status: AC
  Filled 2019-01-18: qty 2

## 2019-01-18 MED ORDER — MIDAZOLAM HCL 2 MG/2ML IJ SOLN
INTRAMUSCULAR | Status: AC
  Filled 2019-01-18: qty 2

## 2019-01-18 MED ORDER — HYDROCODONE-ACETAMINOPHEN 5-325 MG PO TABS: 1.0000 | ORAL_TABLET | ORAL | Status: DC | PRN

## 2019-01-18 MED ORDER — SODIUM CHLORIDE 0.9 % IV SOLN: INTRAVENOUS | Status: DC

## 2019-01-18 MED ORDER — MIDAZOLAM HCL 2 MG/2ML IJ SOLN
INTRAMUSCULAR | Status: AC | PRN
  Administered 2019-01-18: 1 mg via INTRAVENOUS

## 2019-01-18 MED ORDER — LIDOCAINE HCL 1 % IJ SOLN
INTRAMUSCULAR | Status: AC
  Filled 2019-01-18: qty 20

## 2019-01-18 MED ORDER — FENTANYL CITRATE (PF) 100 MCG/2ML IJ SOLN
INTRAMUSCULAR | Status: AC | PRN
  Administered 2019-01-18: 25 ug via INTRAVENOUS

## 2019-01-18 MED ORDER — HEPARIN SODIUM (PORCINE) 1000 UNIT/ML IJ SOLN
INTRAMUSCULAR | Status: AC | PRN
  Administered 2019-01-18: 2000 [IU] via INTRAVENOUS

## 2019-01-18 MED ORDER — IOHEXOL 300 MG/ML  SOLN: 63.0000 mL | Freq: Once | INTRAMUSCULAR | Status: DC | PRN

## 2019-01-18 MED ORDER — LIDOCAINE HCL 1 % IJ SOLN
INTRAMUSCULAR | Status: AC | PRN
  Administered 2019-01-18: 10 mL

## 2019-01-18 NOTE — Discharge Instructions (Signed)
DRINK PLENTY OF FLUIDS FOR THE NEXT 2-3 DAYS TO KEEP HYDRATED.  Femoral Site Care This sheet gives you information about how to care for yourself after your procedure. Your health care provider may also give you more specific instructions. If you have problems or questions, contact your health care provider. What can I expect after the procedure? After the procedure, it is common to have:  Bruising that usually fades within 1-2 weeks.  Tenderness at the site. Follow these instructions at home: Wound care  Follow instructions from your health care provider about how to take care of your insertion site. Make sure you: ? Wash your hands with soap and water before you change your bandage (dressing). If soap and water are not available, use hand sanitizer. ? Change your dressing as told by your health care provider. ? Leave stitches (sutures), skin glue, or adhesive strips in place. These skin closures may need to stay in place for 2 weeks or longer. If adhesive strip edges start to loosen and curl up, you may trim the loose edges. Do not remove adhesive strips completely unless your health care provider tells you to do that.  Do not take baths, swim, or use a hot tub until your health care provider approves.  You may shower 24-48 hours after the procedure or as told by your health care provider. ? Gently wash the site with plain soap and water. ? Pat the area dry with a clean towel. ? Do not rub the site. This may cause bleeding.  Do not apply powder or lotion to the site. Keep the site clean and dry.  Check your femoral site every day for signs of infection. Check for: ? Redness, swelling, or pain. ? Fluid or blood. ? Warmth. ? Pus or a bad smell. Activity  For the first 2-3 days after your procedure, or as long as directed: ? Avoid climbing stairs as much as possible. ? Do not squat.  Do not lift anything that is heavier than 10 lb (4.5 kg) for 5 days  Rest as  directed. ? Avoid sitting for a long time without moving. Get up to take short walks every 1-2 hours.  Do not drive for 24 hours if you were given a medicine to help you relax (sedative). General instructions  Take over-the-counter and prescription medicines only as told by your health care provider.  Keep all follow-up visits as told by your health care provider. This is important. Contact a health care provider if you have:  A fever or chills.  You have redness, swelling, or pain around your insertion site. Get help right away if:  The catheter insertion area swells very fast.  You pass out.  You suddenly start to sweat or your skin gets clammy.  The catheter insertion area is bleeding, and the bleeding does not stop when you hold steady pressure on the area.  The area near or just beyond the catheter insertion site becomes pale, cool, tingly, or numb. These symptoms may represent a serious problem that is an emergency. Do not wait to see if the symptoms will go away. Get medical help right away. Call your local emergency services (911 in the [place] Do not drive yourself to the hospital. Summary  After the procedure, it is common to have bruising that usually fades within 1-2 weeks.  Check your femoral site every day for signs of infection.  Do not lift anything that is heavier than 10 lb (4.5 kg), or the  limit that you are told, until your health care provider says that it is safe. °This information is not intended to replace advice given to you by your health care provider. Make sure you discuss any questions you have with your health care provider. °Document Released: 04/26/2014 Document Revised: 09/05/2017 Document Reviewed: 09/05/2017 °Elsevier Interactive Patient Education © 2019 Elsevier Inc. ° °

## 2019-01-22 ENCOUNTER — Telehealth: Payer: Self-pay | Admitting: Cardiology

## 2019-01-22 NOTE — Telephone Encounter (Signed)
Patient wants to know when he can go back on his Xarelto Please call patient to discuss [phone]

## 2019-01-23 NOTE — Telephone Encounter (Signed)
Called patient back. He reports he has been off of his xarelto since May 5 th 2020 after being diagnosed with a brain aneurysm. His neurologist states he can go back on the xarelto however he is wanting to know if he should continue aspirin with the xarelto. He has appointment with neurologist next week to discuss stenting. I will consult with Dr. Agustin Cree.

## 2019-01-23 NOTE — Telephone Encounter (Signed)
I can not answer this question, it should go to Neurosurgeon

## 2019-01-23 NOTE — Telephone Encounter (Signed)
Called patient back informed him that Dr. Agustin Cree can't advise on this as the neurosurgeon treating the aneurysm needs to address this. Patient verbally understands and will contact neuro surgeon.

## 2019-01-23 NOTE — Telephone Encounter (Signed)
Pt would like an answer about taking the Xarelto and the Aspirin together. Neurologist referred this question to Dr. Raliegh Ip and Dr. Raliegh Ip referred this to the neurologist. Pt would like an answer about this please. Pt says cardiologist and neurologist needs to talk and let him know what to do asap.

## 2019-01-24 NOTE — Telephone Encounter (Signed)
Thank you for taking care of Johnathan Bauer,  Can we start him back on anticoagulation? Henretter Piekarski  EEAT [phone]

## 2019-01-24 NOTE — Telephone Encounter (Signed)
Dr. Agustin Cree, just to clarify are we restarting both Xarelto and Aspirin?

## 2019-01-31 ENCOUNTER — Other Ambulatory Visit: Payer: Self-pay | Admitting: Neurosurgery

## 2019-01-31 ENCOUNTER — Other Ambulatory Visit (HOSPITAL_COMMUNITY): Payer: Self-pay | Admitting: Neurosurgery

## 2019-01-31 DIAGNOSIS — I671 Cerebral aneurysm, nonruptured: Secondary | ICD-10-CM | POA: Diagnosis not present

## 2019-01-31 NOTE — Telephone Encounter (Signed)
Per Dr. Agustin Cree anticoagulation decisions need to be made by neurosurgeon. Dr. Agustin Cree has messaged them.

## 2019-02-01 MED ORDER — IOHEXOL 300 MG/ML  SOLN
150.0000 mL | Freq: Once | INTRAMUSCULAR | Status: AC | PRN
  Administered 2019-02-01: 65 mL via INTRA_ARTERIAL

## 2019-02-07 ENCOUNTER — Telehealth: Payer: Self-pay | Admitting: Cardiology

## 2019-02-07 NOTE — Telephone Encounter (Signed)
Afib with a CHADS of 3 no anticoag. Please advise

## 2019-02-07 NOTE — Telephone Encounter (Signed)
Pt is having tooth extraction and need the ok from [facility]

## 2019-02-08 ENCOUNTER — Other Ambulatory Visit: Payer: Self-pay

## 2019-02-08 MED ORDER — DIGOXIN 250 MCG PO TABS: ORAL_TABLET | ORAL | 1 refills | Status: DC

## 2019-02-13 NOTE — Telephone Encounter (Signed)
Dr. Anthony Sar needs something in writing specifying medication directions for oral surgery. Fax # is [phone]. Please advise. Pt on Xarelto.

## 2019-02-14 NOTE — Telephone Encounter (Signed)
Discussed with Dr. Agustin Cree. He advises that this procedure not be done at this time as it is not safe for him to come off of xarelto right now. Left message for patient to return call if he has questions. I also called Kristie at the dental office and informed her of this.

## 2019-02-14 NOTE — Telephone Encounter (Signed)
Alyse Low called from the Dental office and they said patient is calling them like crazy and they need to know today if patient is cleared or they are going to cancel procedure for Monday.. Please advise ASAP.

## 2019-02-15 ENCOUNTER — Emergency Department (HOSPITAL_COMMUNITY)
Admission: EM | Admit: 2019-02-15 | Discharge: 2019-02-15 | Disposition: A | Discharge status: Home / Self Care | Payer: PPO | Attending: Emergency Medicine | Admitting: Emergency Medicine

## 2019-02-15 ENCOUNTER — Other Ambulatory Visit: Payer: Self-pay

## 2019-02-15 DIAGNOSIS — Z7901 Long term (current) use of anticoagulants: Secondary | ICD-10-CM | POA: Insufficient documentation

## 2019-02-15 DIAGNOSIS — Z041 Encounter for examination and observation following transport accident: Secondary | ICD-10-CM | POA: Insufficient documentation
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
## 2019-02-15 NOTE — Discharge Instructions (Addendum)
RETURN TO THE ER IF YOU HAVE SEVERE HEADACHE, WEAKNESS, CHEST PAIN, BREATHING PROBLEMS, VOMITING, OR ABDOMINAL PAIN.

## 2019-02-15 NOTE — ED Notes (Signed)
Pt discharged with all belongings. Discharge instructions reviewed with pt, pt verbalized understanding. Opportunity for questions provided. Pt ambulatory at discharge. Pt states his wife is going to pick him up.
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
## 2019-02-22 ENCOUNTER — Other Ambulatory Visit: Payer: Self-pay | Admitting: Neurosurgery

## 2019-02-28 ENCOUNTER — Telehealth: Payer: Self-pay | Admitting: Cardiology

## 2019-02-28 NOTE — Telephone Encounter (Signed)
Christy from [facility] is asking about patient being off blood thinner. Please advise.

## 2019-02-28 NOTE — Telephone Encounter (Signed)
Phoned patient who verifies that he is taking xarelto 20mg  daily prescribed by Dr. Agustin Cree. He expressed frustration that he's been waiting a long time to have dental work which has been postponed due to covid. Dr. Agustin Cree would like patient/dentist office to go through neurology for clearance/instructions to hold blood thinner for dental work, left voice message for Sequoyah at [facility] to return my call.

## 2019-02-28 NOTE — Telephone Encounter (Signed)
Spoke with Alyse Low at [facility]. They want to know if patient can come off blood thinner for procedure. Told Christy I will check on this and call her back.

## 2019-03-01 NOTE — Telephone Encounter (Signed)
Phoned Christy at [facility] and informed that Dr. Agustin Cree is deferring anticoagulation management for dental procedures to neurologist. Alyse Low states neurology will not address anticoagulation until seen in office July 7th. Verbalizes understanding and no further questions asked.

## 2019-03-07 NOTE — Pre-Procedure Instructions (Signed)
Johnathan Bauer  03/07/2019     [id number] - [place], [place] [address] [place] [address] Phone: [phone] Fax: [phone]   Your procedure is scheduled on Tuesday, July 7th.  Report to [facility] Entrance A at 8:00 A.M.  Call this number if you have problems the morning of surgery:  [phone]   Remember:  Do not eat or drink after midnight on July 6th     Take these medicines the morning of surgery with A SIP OF WATER digoxin (LANOXIN)  metoprolol tartrate (LOPRESSOR)  sertraline (ZOLOFT)   If needed - albuterol (VENTOLIN HFA), nitroGLYCERIN (NITROSTAT)  Bring inhalers with you the day of surgery  7 days prior to surgery STOP taking any Aspirin (unless otherwise instructed by your surgeon), Aleve, Naproxen, Ibuprofen, Motrin, Advil, Goody's, BC's, all herbal medications, fish oil, and all vitamins.   Special instructions:   [place]- Preparing For Surgery  Before surgery, you can play an important role. Because skin is not sterile, your skin needs to be as free of germs as possible. You can reduce the number of germs on your skin by washing with CHG (chlorahexidine gluconate) Soap before surgery.  CHG is an antiseptic cleaner which kills germs and bonds with the skin to continue killing germs even after washing.    Oral Hygiene is also important to reduce your risk of infection.  Remember - BRUSH YOUR TEETH THE MORNING OF SURGERY WITH YOUR REGULAR TOOTHPASTE  Please do not use if you have an allergy to CHG or antibacterial soaps. If your skin becomes reddened/irritated stop using the CHG.  Do not shave (including legs and underarms) for at least 48 hours prior to first CHG shower. It is OK to shave your face.  Please follow these instructions carefully.   1. Shower the NIGHT BEFORE SURGERY and the MORNING OF SURGERY with CHG.   2. If you chose to wash your hair, wash your hair first as usual with  your normal shampoo.  3. After you shampoo, rinse your hair and body thoroughly to remove the shampoo.  4. Use CHG as you would any other liquid soap. You can apply CHG directly to the skin and wash gently with a scrungie or a clean washcloth.   5. Apply the CHG Soap to your body ONLY FROM THE NECK DOWN.  Do not use on open wounds or open sores. Avoid contact with your eyes, ears, mouth and genitals (private parts). Wash Face and genitals (private parts)  with your normal soap.  6. Wash thoroughly, paying special attention to the area where your surgery will be performed.  7. Thoroughly rinse your body with warm water from the neck down.  8. DO NOT shower/wash with your normal soap after using and rinsing off the CHG Soap.  9. Pat yourself dry with a CLEAN TOWEL.  10. Wear CLEAN PAJAMAS to bed the night before surgery, wear comfortable clothes the morning of surgery  11. Place CLEAN SHEETS on your bed the night of your first shower and DO NOT SLEEP WITH PETS.  Day of Surgery: Do not wear jewelry, make-up or nail polish.  Do not wear lotions, powders, or perfumes/cologne, or deodorant.  Do not shave 48 hours prior to surgery.  Men may shave neck and face.  Do not bring valuables to the hospital.  [facility] is not responsible for any belongings or valuables.  Please wear clean clothes  to the hospital/surgery center.   Remember to brush your teeth WITH YOUR REGULAR TOOTHPASTE.  Contacts, dentures or bridgework may not be worn into surgery.  Leave your suitcase in the car.  After surgery it may be brought to your room.  For patients admitted to the hospital, discharge time will be determined by your treatment team.  Patients discharged the day of surgery will not be allowed to drive home.   Please read over the following fact sheets that you were given. Pain Booklet, Coughing and Deep Breathing and MRSA Information

## 2019-03-08 ENCOUNTER — Encounter (HOSPITAL_COMMUNITY): Payer: Self-pay

## 2019-03-08 ENCOUNTER — Encounter (HOSPITAL_COMMUNITY)
Admission: RE | Admit: 2019-03-08 | Discharge: 2019-03-08 | Disposition: A | Discharge status: Home / Self Care | Payer: PPO | Source: Ambulatory Visit | Attending: Neurosurgery | Admitting: Neurosurgery

## 2019-03-08 ENCOUNTER — Other Ambulatory Visit: Payer: Self-pay

## 2019-03-08 ENCOUNTER — Other Ambulatory Visit (HOSPITAL_COMMUNITY): Payer: PPO

## 2019-03-08 DIAGNOSIS — Z01812 Encounter for preprocedural laboratory examination: Secondary | ICD-10-CM | POA: Diagnosis not present

## 2019-03-08 DIAGNOSIS — Z1159 Encounter for screening for other viral diseases: Secondary | ICD-10-CM | POA: Insufficient documentation

## 2019-03-08 HISTORY — DX: Cardiac arrhythmia, unspecified: I49.9

## 2019-03-08 LAB — CBC WITH DIFFERENTIAL/PLATELET
Abs Immature Granulocytes: 0.05 10*3/uL (ref 0.00–0.07)
Basophils Absolute: 0.1 10*3/uL (ref 0.0–0.1)
Basophils Relative: 1 %
Eosinophils Absolute: 0.2 10*3/uL (ref 0.0–0.5)
Eosinophils Relative: 3 %
HCT: 51.1 % (ref 39.0–52.0)
Hemoglobin: 17.1 g/dL — ABNORMAL HIGH (ref 13.0–17.0)
Immature Granulocytes: 1 %
Lymphocytes Relative: 29 %
Lymphs Abs: 1.9 10*3/uL (ref 0.7–4.0)
MCH: 33.6 pg (ref 26.0–34.0)
MCHC: 33.5 g/dL (ref 30.0–36.0)
MCV: 100.4 fL — ABNORMAL HIGH (ref 80.0–100.0)
Monocytes Absolute: 0.7 10*3/uL (ref 0.1–1.0)
Monocytes Relative: 10 %
Neutro Abs: 3.7 10*3/uL (ref 1.7–7.7)
Neutrophils Relative %: 56 %
Platelets: 245 10*3/uL (ref 150–400)
RBC: 5.09 MIL/uL (ref 4.22–5.81)
RDW: 11.9 % (ref 11.5–15.5)
WBC: 6.7 10*3/uL (ref 4.0–10.5)
nRBC: 0 % (ref 0.0–0.2)

## 2019-03-08 LAB — URINALYSIS, ROUTINE W REFLEX MICROSCOPIC
Bilirubin Urine: NEGATIVE
Glucose, UA: NEGATIVE mg/dL
Hgb urine dipstick: NEGATIVE
Ketones, ur: NEGATIVE mg/dL
Leukocytes,Ua: NEGATIVE
Nitrite: NEGATIVE
Protein, ur: NEGATIVE mg/dL
Specific Gravity, Urine: 1.016 (ref 1.005–1.030)
pH: 6 (ref 5.0–8.0)

## 2019-03-08 LAB — BASIC METABOLIC PANEL
Anion gap: 11 (ref 5–15)
BUN: 15 mg/dL (ref 8–23)
CO2: 26 mmol/L (ref 22–32)
Calcium: 9.5 mg/dL (ref 8.9–10.3)
Chloride: 103 mmol/L (ref 98–111)
Creatinine, Ser: 1.13 mg/dL (ref 0.61–1.24)
GFR calc Af Amer: >60 mL/min (ref >60)
GFR calc non Af Amer: >60 mL/min (ref >60)
Glucose, Bld: 95 mg/dL (ref 70–99)
Potassium: 4.8 mmol/L (ref 3.5–5.1)
Sodium: 140 mmol/L (ref 135–145)

## 2019-03-08 LAB — PROTIME-INR
INR: 1.1 (ref 0.8–1.2)
Prothrombin Time: 14 seconds (ref 11.4–15.2)

## 2019-03-08 NOTE — Progress Notes (Signed)
PCP - Cecille Amsterdam @[facility]  Medical in [facility] Cardiologist - [facility] in [place]  Chest x-ray -  na EKG - 01/11/19 Stress Test - > 4 yrs. ECHO - 08/15/18 Cardiac Cath - 2013  Sleep Study - na CPAP -   Fasting Blood Sugar - na Checks Blood Sugar _____ times a day  Blood Thinner Instructions:pt. States he is to continue asa/plavix per Dr. Kathyrn Sheriff. He will call to see if he needs to take them the day of surgery. Aspirin Instructions:  Anesthesia review: A-fib, cardiac history  Patient denies shortness of breath, fever, cough and chest pain at PAT appointment   Patient verbalized understanding of instructions that were given to them at the PAT appointment. Patient was also instructed that they will need to review over the PAT instructions again at home before surgery.

## 2019-03-08 NOTE — H&P (Signed)
Chief Complaint    left ophthalmic aneurysm  HPI   HPI: Johnathan Bauer is a 68 y.o. 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  He underwent diagnostic cerebral angiogram which confirmed a left ophthalmic aneurysm as well as a smaller aneurysm on the opposite carotid wall.  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  Given size of the aneurysm and possible symptomatic nature it was recommended he undergo treatment.  He presents today for pipeline embolization.  He is without any concerns.  Of note he does have a history of atrial fibrillation.  Dr. Kathyrn Sheriff discussed with the patient's cardiologist, Dr. Rejeana Brock, the need for dual anti-platelet therapy after the procedure.  He recommended continuation of the cervical toe in addition to the dual anti-platelet therapy.  Patient Active Problem List   Diagnosis Date Noted  . Vision changes 01/10/2019  . Stroke-like symptoms 01/10/2019  . History of stroke 01/10/2019  . Blurry vision, bilateral 01/10/2019  . Dyspnea on exertion 07/08/2017  . History of colonic polyps 11/30/2016  . Chronic anticoagulation 11/30/2016  . Alcohol abuse 05/25/2016  . 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 Coronary artery disease involving native coronary artery of native heart without angina pectoris 05/20/2015  . Essential hypertension 05/20/2015  . S/P hernia repair 12/31/2014  . Respiratory failure, post-operative ([place]) 09/07/2012  . NSVT, 7 beat run 1/02 09/07/2012  . Chronic atrial fibrillation ([place]): Anticoagulated his chads 2 Vas score equals 3 09/03/2012  . 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 Perforated sigmoid colon ([place]) 09/03/2012  . Diverticulitis of sigmoid colon 09/02/2012    PMH: Past Medical History:  Diagnosis Date  . Arthritis    "touch in my right thumb" (12/31/2014)  . Chronic  atrial fibrillation    did not like coumadin  . Chronic kidney disease 09/2012  . Coronary artery disease    s/p PCI Sept 2013 with DES OM  . Diverticulitis with perforation   . Pneumonia 09/2012  . Stroke [facility]) 04/2016    PSH: Past Surgical History:  Procedure Laterality Date  . COLON SURGERY    . COLOSTOMY REVERSAL  06/06/2013   Dr Rosendo Gros  . COLOSTOMY REVISION  09/02/2012   Procedure: COLON RESECTION SIGMOID;  Surgeon: Serafina Mitchell, MD;  Location: [facility] OR;  Service: Vascular;  Laterality: N/A;  Sigmoid resection with End Colostomy.  . COLOSTOMY TAKEDOWN N/A 06/06/2013   Procedure: LAPAROSCOPIC OSTOMY TAKEDOWN AND ANASTOMOSIS ;  Surgeon: Ralene Ok, MD;  Location: [facility];  Service: General;  Laterality: N/A;  . CORONARY ANGIOPLASTY WITH STENT PLACEMENT  06/2012   "1"  . HERNIA REPAIR    . INCISIONAL HERNIA REPAIR  12/31/2014   w/mesh  . INCISIONAL HERNIA REPAIR N/A 12/31/2014   Procedure: OPEN INCISIONAL HERNIA REPAIR WITH MESH (TAR PROCEDURE);  Surgeon: Ralene Ok, MD;  Location: [place];  Service: General;  Laterality: N/A;  . INSERTION OF MESH N/A 12/31/2014   Procedure: INSERTION OF MESH;  Surgeon: Ralene Ok, MD;  Location: [facility];  Service: General;  Laterality: N/A;  . IR 3D INDEPENDENT WKST  01/18/2019  . IR ANGIO INTRA EXTRACRAN SEL INTERNAL CAROTID BILAT MOD SED  01/18/2019  . IR ANGIOGRAM EXTREMITY LEFT  01/18/2019  . LAPAROTOMY  09/02/2012   Procedure: EXPLORATORY LAPAROTOMY;  Surgeon: Serafina Mitchell, MD;  Location: [facility] OR;  Service: Vascular;  Laterality: N/A;  Exploratory Laparotomy  with Sigmoid resection with end colocstomy.  . TONSILLECTOMY      (Not in a hospital admission)   SH: Social History   Tobacco Use  . Smoking status: Current Every Day Smoker    Packs/day: 0.25    Years: 45.00    Pack years: 11.25    Types: Cigarettes  . Smokeless tobacco: Never Used  . Tobacco comment: quit latter part of last year  Substance Use Topics  . Alcohol  use: No  . Drug use: No    MEDS: Prior to Admission medications   Medication Sig Start Date End Date Taking? Authorizing Provider  albuterol (VENTOLIN HFA) 108 (90 Base) MCG/ACT inhaler Inhale 2-4 puffs into the lungs every 6 (six) hours as needed for shortness of breath or wheezing. 12/11/18   [provider]  aspirin EC 325 MG tablet Take 325 mg by mouth daily.     [provider]  clopidogrel (PLAVIX) 75 MG tablet Take 75 mg by mouth daily.    [provider]  digoxin (LANOXIN) 0.25 MG tablet TAKE 1 TABLET (250 MCG TOTAL) BY MOUTH DAILY. Patient taking differently: Take 0.25 mg by mouth daily.  02/08/19   Park Liter, MD  metoprolol tartrate (LOPRESSOR) 50 MG tablet Take 1 tablet (50 mg total) by mouth daily. 12/18/18   Park Liter, MD  nitroGLYCERIN (NITROSTAT) 0.4 MG SL tablet Place 1 tablet (0.4 mg total) under the tongue every 5 (five) minutes as needed for chest pain. 01/15/19   Park Liter, MD  sertraline (ZOLOFT) 50 MG tablet Take 50 mg by mouth daily.    [provider]    ALLERGY: Allergies  Allergen Reactions  . Codeine Nausea And Vomiting  . Poison Ivy Extract [Poison Ivy Extract] Hives    Social History   Tobacco Use  . Smoking status: Current Every Day Smoker    Packs/day: 0.25    Years: 45.00    Pack years: 11.25    Types: Cigarettes  . Smokeless tobacco: Never Used  . Tobacco comment: quit latter part of last year  Substance Use Topics  . Alcohol use: No     Family History  Problem Relation Age of Onset  . Lymphoma Father   . Prostate cancer Paternal Grandfather   . Colon cancer Neg Hx      ROS   ROS  Exam   There were no vitals filed for this visit. General appearance: WDWN, NAD Eyes: No scleral injection Cardiovascular: Regular rate and rhythm without murmurs, rubs, gallops. No edema or variciosities. Distal pulses normal. Pulmonary: Effort normal, non-labored breathing Musculoskeletal:      Muscle tone upper extremities: Normal    Muscle tone lower extremities: Normal    Motor exam: Upper Extremities Deltoid Bicep Tricep Grip  Right 5/5 5/5 5/5 5/5  Left 5/5 5/5 5/5 5/5   Lower Extremity IP Quad PF DF EHL  Right 5/5 5/5 5/5 5/5 5/5  Left 5/5 5/5 5/5 5/5 5/5   Neurological Mental Status:    - Patient is awake, alert, oriented to person, place, month, year, and situation    - Patient is able to give a clear and coherent history.    - No signs of aphasia or neglect Cranial Nerves    - II: Visual Fields are full. PERRL    - III/IV/VI: EOMI without ptosis or diploplia.     - V: Facial sensation is grossly normal    - VII: Facial movement is symmetric.     -  VIII: hearing is intact to voice    - X: Uvula elevates symmetrically    - XI: Shoulder shrug is symmetric.    - XII: tongue is midline without atrophy or fasciculations.  Sensory: Sensation grossly intact to LT Deep Tendon Reflexes    - 2+ and symmetric in the biceps and patellae. Plantars   - Toes are downgoing bilaterally.  Cerebellar    - FNF and HKS are intact bilaterally   Results - Imaging/Labs   No results found for this or any previous visit (from the past 48 hour(s)).  No results found.  IMAGING: Diagnostic cerebral angiogram was again reviewed demonstrating likely partially thrombosed left ophthalmic aneurysm, with a smaller aneurysm on the opposite carotid wall.  Impression/Plan   68 y.o. male with history of atrial fibrillation on anticoagulation with partially thrombosed left ophthalmic segment aneurysm possibly related to a episode of amaurosis fugax. We will proceed with pipeline embolization of a left carotid aneurysm.    While in the office risks, benefits and alternatives to the procedure were discussed.  Patient stated understanding and wished to proceed.  Ferne Reus, PA-C [facility] [employer]

## 2019-03-08 NOTE — H&P (Deleted)
  The note originally documented on this encounter has been moved the the encounter in which it belongs.  

## 2019-03-09 ENCOUNTER — Other Ambulatory Visit (HOSPITAL_COMMUNITY)
Admission: RE | Admit: 2019-03-09 | Discharge: 2019-03-09 | Disposition: A | Discharge status: Home / Self Care | Payer: PPO | Source: Ambulatory Visit | Attending: Neurosurgery | Admitting: Neurosurgery

## 2019-03-09 DIAGNOSIS — Z01812 Encounter for preprocedural laboratory examination: Secondary | ICD-10-CM | POA: Diagnosis not present

## 2019-03-09 LAB — SARS CORONAVIRUS 2 (TAT 6-24 HRS): SARS Coronavirus 2: NEGATIVE

## 2019-03-12 ENCOUNTER — Other Ambulatory Visit: Payer: Self-pay | Admitting: Cardiology

## 2019-03-12 NOTE — Anesthesia Preprocedure Evaluation (Addendum)
Anesthesia Evaluation  Patient identified by MRN, date of birth, ID band Patient awake    Reviewed: Allergy & Precautions, NPO status , Patient's Chart, lab work & pertinent test results  Airway Mallampati: II  TM Distance: >3 FB Neck ROM: Full    Dental  (+) Missing, Chipped,    Pulmonary former smoker,    Pulmonary exam normal breath sounds clear to auscultation       Cardiovascular hypertension, Pt. 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  Reproductive/Obstetrics                           Anesthesia Physical Anesthesia Plan  ASA: III  Anesthesia Plan: General   Post-op Pain Management:    Induction: Intravenous  PONV Risk Score and Plan: 2 and Ondansetron, Dexamethasone and Treatment may vary due to age or medical condition  Airway Management Planned: Oral ETT  Additional Equipment: Arterial line  Intra-op Plan:   Post-operative Plan: Possible Post-op intubation/ventilation and Extubation in OR  Informed Consent: I have reviewed the patients History and Physical, chart, labs and discussed the procedure including the risks, benefits and alternatives for the proposed anesthesia with the patient or authorized representative who has indicated his/her understanding and acceptance.     Dental advisory  given  Plan Discussed with: CRNA  Anesthesia Plan Comments: (Follows with Dr. Agustin Cree for Afib and CAD s/p DES to OM 2013. Last seen 06/23/18. Echo ordered at that time for monitoring of LV function.  TTE 08/15/18:  - Left ventricle: The cavity size was normal. Systolic function was   normal. The estimated ejection fraction was in the range of 55%   to 60%. Wall motion was normal; there were no regional wall   motion abnormalities. - Mitral valve: There was mild regurgitation. - Left atrium: The atrium was severely dilated. - Right atrium: The atrium was moderately dilated. - Tricuspid valve: There was moderate regurgitation.  Impressions:  - Normal LVEF.   Severe LAE.   Moderate RAE.   Moderate TR. )      Anesthesia Quick Evaluation

## 2019-03-13 ENCOUNTER — Ambulatory Visit (HOSPITAL_COMMUNITY)
Admission: RE | Admit: 2019-03-13 | Discharge: 2019-03-13 | Disposition: A | Discharge status: Home / Self Care | Payer: PPO | Source: Ambulatory Visit | Attending: Neurosurgery | Admitting: Neurosurgery

## 2019-03-13 ENCOUNTER — Other Ambulatory Visit: Payer: Self-pay

## 2019-03-13 ENCOUNTER — Encounter (HOSPITAL_COMMUNITY)
Admission: RE | Disposition: A | Discharge status: Home / Self Care | Payer: Self-pay | Source: Home / Self Care | Attending: Neurosurgery

## 2019-03-13 ENCOUNTER — Inpatient Hospital Stay (HOSPITAL_COMMUNITY)
Admission: RE | Admit: 2019-03-13 | Discharge: 2019-03-14 | DRG: 038 | Disposition: A | Discharge status: Home / Self Care | Payer: PPO | Attending: Neurosurgery | Admitting: Neurosurgery

## 2019-03-13 ENCOUNTER — Encounter (HOSPITAL_COMMUNITY): Payer: Self-pay | Admitting: Registered Nurse

## 2019-03-13 ENCOUNTER — Inpatient Hospital Stay (HOSPITAL_COMMUNITY): Payer: PPO

## 2019-03-13 ENCOUNTER — Inpatient Hospital Stay (HOSPITAL_COMMUNITY): Payer: PPO | Admitting: Certified Registered Nurse Anesthetist

## 2019-03-13 DIAGNOSIS — F17210 Nicotine dependence, cigarettes, uncomplicated: Secondary | ICD-10-CM | POA: Diagnosis present

## 2019-03-13 DIAGNOSIS — I129 Hypertensive chronic kidney disease with stage 1 through stage 4 chronic kidney disease, or unspecified chronic kidney disease: Secondary | ICD-10-CM | POA: Diagnosis not present

## 2019-03-13 DIAGNOSIS — M19041 Primary osteoarthritis, right hand: Secondary | ICD-10-CM | POA: Diagnosis not present

## 2019-03-13 DIAGNOSIS — G9340 Encephalopathy, unspecified: Secondary | ICD-10-CM | POA: Diagnosis not present

## 2019-03-13 DIAGNOSIS — I671 Cerebral aneurysm, nonruptured: Principal | ICD-10-CM | POA: Diagnosis present

## 2019-03-13 DIAGNOSIS — Z7901 Long term (current) use of anticoagulants: Secondary | ICD-10-CM | POA: Diagnosis not present

## 2019-03-13 DIAGNOSIS — I69354 Hemiplegia and hemiparesis following cerebral infarction affecting left non-dominant side: Secondary | ICD-10-CM

## 2019-03-13 DIAGNOSIS — I10 Essential (primary) hypertension: Secondary | ICD-10-CM | POA: Diagnosis not present

## 2019-03-13 DIAGNOSIS — Z86010 Personal history of colonic polyps: Secondary | ICD-10-CM | POA: Diagnosis not present

## 2019-03-13 DIAGNOSIS — I482 Chronic atrial fibrillation, unspecified: Secondary | ICD-10-CM | POA: Diagnosis present

## 2019-03-13 DIAGNOSIS — R569 Unspecified convulsions: Secondary | ICD-10-CM | POA: Diagnosis not present

## 2019-03-13 DIAGNOSIS — I2510 Atherosclerotic heart disease of native coronary artery without angina pectoris: Secondary | ICD-10-CM | POA: Diagnosis present

## 2019-03-13 DIAGNOSIS — Z79899 Other long term (current) drug therapy: Secondary | ICD-10-CM | POA: Diagnosis not present

## 2019-03-13 DIAGNOSIS — N189 Chronic kidney disease, unspecified: Secondary | ICD-10-CM | POA: Diagnosis not present

## 2019-03-13 DIAGNOSIS — Z7982 Long term (current) use of aspirin: Secondary | ICD-10-CM

## 2019-03-13 DIAGNOSIS — Z9109 Other allergy status, other than to drugs and biological substances: Secondary | ICD-10-CM

## 2019-03-13 DIAGNOSIS — Z885 Allergy status to narcotic agent status: Secondary | ICD-10-CM

## 2019-03-13 DIAGNOSIS — Z9861 Coronary angioplasty status: Secondary | ICD-10-CM | POA: Diagnosis not present

## 2019-03-13 DIAGNOSIS — I4891 Unspecified atrial fibrillation: Secondary | ICD-10-CM | POA: Diagnosis not present

## 2019-03-13 DIAGNOSIS — Z7902 Long term (current) use of antithrombotics/antiplatelets: Secondary | ICD-10-CM | POA: Diagnosis not present

## 2019-03-13 HISTORY — PX: IR ANGIO INTRA EXTRACRAN SEL INTERNAL CAROTID UNI L MOD SED: IMG5361

## 2019-03-13 HISTORY — PX: IR TRANSCATH/EMBOLIZ: IMG695

## 2019-03-13 HISTORY — PX: RADIOLOGY WITH ANESTHESIA: SHX6223

## 2019-03-13 HISTORY — PX: IR ANGIOGRAM FOLLOW UP STUDY: IMG697

## 2019-03-13 HISTORY — DX: Cerebral aneurysm, nonruptured: I67.1

## 2019-03-13 LAB — MRSA PCR SCREENING: MRSA by PCR: NEGATIVE

## 2019-03-13 SURGERY — IR WITH ANESTHESIA
Anesthesia: General

## 2019-03-13 MED ORDER — FENTANYL CITRATE (PF) 100 MCG/2ML IJ SOLN: 25.0000 ug | INTRAMUSCULAR | Status: DC | PRN

## 2019-03-13 MED ORDER — HEPARIN SODIUM (PORCINE) 1000 UNIT/ML IJ SOLN
INTRAMUSCULAR | Status: DC | PRN
  Administered 2019-03-13: 5000 [IU] via INTRAVENOUS

## 2019-03-13 MED ORDER — ASPIRIN EC 325 MG PO TBEC
325.0000 mg | DELAYED_RELEASE_TABLET | Freq: Every day | ORAL | Status: DC
  Administered 2019-03-14: 325 mg via ORAL
  Filled 2019-03-13: qty 1

## 2019-03-13 MED ORDER — SUGAMMADEX SODIUM 200 MG/2ML IV SOLN
INTRAVENOUS | Status: DC | PRN
  Administered 2019-03-13: 150 mg via INTRAVENOUS
  Administered 2019-03-13: 50 mg via INTRAVENOUS

## 2019-03-13 MED ORDER — ACETAMINOPHEN 500 MG PO TABS
ORAL_TABLET | ORAL | Status: AC
  Administered 2019-03-13: 1000 mg via ORAL
  Filled 2019-03-13: qty 2

## 2019-03-13 MED ORDER — CEFAZOLIN SODIUM-DEXTROSE 2-4 GM/100ML-% IV SOLN
INTRAVENOUS | Status: AC
  Filled 2019-03-13: qty 100

## 2019-03-13 MED ORDER — IOHEXOL 300 MG/ML  SOLN
150.0000 mL | Freq: Once | INTRAMUSCULAR | Status: AC | PRN
  Administered 2019-03-13: 50 mL via INTRA_ARTERIAL

## 2019-03-13 MED ORDER — ONDANSETRON HCL 4 MG/2ML IJ SOLN: 4.0000 mg | Freq: Once | INTRAMUSCULAR | Status: DC | PRN

## 2019-03-13 MED ORDER — METOPROLOL TARTRATE 5 MG/5ML IV SOLN
INTRAVENOUS | Status: DC | PRN
  Administered 2019-03-13 (×5): 1 mg via INTRAVENOUS

## 2019-03-13 MED ORDER — SENNA 8.6 MG PO TABS
1.0000 | ORAL_TABLET | Freq: Two times a day (BID) | ORAL | Status: DC
  Administered 2019-03-13: 8.6 mg via ORAL
  Filled 2019-03-13 (×2): qty 1

## 2019-03-13 MED ORDER — CHLORHEXIDINE GLUCONATE CLOTH 2 % EX PADS: 6.0000 | MEDICATED_PAD | Freq: Once | CUTANEOUS | Status: DC

## 2019-03-13 MED ORDER — METOPROLOL TARTRATE 50 MG PO TABS
50.0000 mg | ORAL_TABLET | Freq: Every day | ORAL | Status: DC
  Administered 2019-03-14: 50 mg via ORAL
  Filled 2019-03-13: qty 1

## 2019-03-13 MED ORDER — ONDANSETRON HCL 4 MG PO TABS: 4.0000 mg | ORAL_TABLET | ORAL | Status: DC | PRN

## 2019-03-13 MED ORDER — DOCUSATE SODIUM 100 MG PO CAPS
100.0000 mg | ORAL_CAPSULE | Freq: Two times a day (BID) | ORAL | Status: DC
  Administered 2019-03-13: 100 mg via ORAL
  Filled 2019-03-13 (×2): qty 1

## 2019-03-13 MED ORDER — ACETAMINOPHEN 500 MG PO TABS
1000.0000 mg | ORAL_TABLET | Freq: Once | ORAL | Status: AC
  Administered 2019-03-13: 1000 mg via ORAL

## 2019-03-13 MED ORDER — CHLORHEXIDINE GLUCONATE CLOTH 2 % EX PADS
6.0000 | MEDICATED_PAD | Freq: Every day | CUTANEOUS | Status: DC
  Administered 2019-03-13 – 2019-03-14 (×2): 6 via TOPICAL

## 2019-03-13 MED ORDER — ALBUTEROL SULFATE HFA 108 (90 BASE) MCG/ACT IN AERS
INHALATION_SPRAY | RESPIRATORY_TRACT | Status: DC | PRN
  Administered 2019-03-13: 6 via RESPIRATORY_TRACT

## 2019-03-13 MED ORDER — LEVETIRACETAM IN NACL 500 MG/100ML IV SOLN
500.0000 mg | Freq: Two times a day (BID) | INTRAVENOUS | Status: DC
  Administered 2019-03-13 – 2019-03-14 (×2): 500 mg via INTRAVENOUS
  Filled 2019-03-13 (×2): qty 100

## 2019-03-13 MED ORDER — ALBUTEROL SULFATE (2.5 MG/3ML) 0.083% IN NEBU: 2.5000 mg | INHALATION_SOLUTION | Freq: Four times a day (QID) | RESPIRATORY_TRACT | Status: DC | PRN

## 2019-03-13 MED ORDER — FENTANYL CITRATE (PF) 250 MCG/5ML IJ SOLN
INTRAMUSCULAR | Status: DC | PRN
  Administered 2019-03-13: 50 ug via INTRAVENOUS
  Administered 2019-03-13: 100 ug via INTRAVENOUS

## 2019-03-13 MED ORDER — ONDANSETRON HCL 4 MG/2ML IJ SOLN: 4.0000 mg | INTRAMUSCULAR | Status: DC | PRN

## 2019-03-13 MED ORDER — LACTATED RINGERS IV SOLN
INTRAVENOUS | Status: DC
  Administered 2019-03-13 (×2): via INTRAVENOUS

## 2019-03-13 MED ORDER — METOPROLOL TARTRATE 5 MG/5ML IV SOLN
INTRAVENOUS | Status: AC
  Filled 2019-03-13: qty 5

## 2019-03-13 MED ORDER — PROPOFOL 10 MG/ML IV BOLUS
INTRAVENOUS | Status: DC | PRN
  Administered 2019-03-13: 150 mg via INTRAVENOUS
  Administered 2019-03-13: 50 mg via INTRAVENOUS

## 2019-03-13 MED ORDER — ROCURONIUM BROMIDE 10 MG/ML (PF) SYRINGE
PREFILLED_SYRINGE | INTRAVENOUS | Status: DC | PRN
  Administered 2019-03-13: 20 mg via INTRAVENOUS
  Administered 2019-03-13: 50 mg via INTRAVENOUS

## 2019-03-13 MED ORDER — CLOPIDOGREL BISULFATE 75 MG PO TABS
75.0000 mg | ORAL_TABLET | Freq: Every day | ORAL | Status: DC
  Administered 2019-03-14: 75 mg via ORAL
  Filled 2019-03-13: qty 1

## 2019-03-13 MED ORDER — DEXAMETHASONE SODIUM PHOSPHATE 10 MG/ML IJ SOLN
INTRAMUSCULAR | Status: DC | PRN
  Administered 2019-03-13: 10 mg via INTRAVENOUS

## 2019-03-13 MED ORDER — DIGOXIN 250 MCG PO TABS
0.2500 mg | ORAL_TABLET | Freq: Every day | ORAL | Status: DC
  Administered 2019-03-14: 0.25 mg via ORAL
  Filled 2019-03-13: qty 1

## 2019-03-13 MED ORDER — NITROGLYCERIN 0.4 MG SL SUBL: 0.4000 mg | SUBLINGUAL_TABLET | SUBLINGUAL | Status: DC | PRN

## 2019-03-13 MED ORDER — LACTATED RINGERS IV SOLN
INTRAVENOUS | Status: DC | PRN
  Administered 2019-03-13 (×2): via INTRAVENOUS

## 2019-03-13 MED ORDER — LIDOCAINE 2% (20 MG/ML) 5 ML SYRINGE
INTRAMUSCULAR | Status: DC | PRN
  Administered 2019-03-13: 60 mg via INTRAVENOUS
  Administered 2019-03-13: 40 mg via INTRAVENOUS

## 2019-03-13 MED ORDER — PHENYLEPHRINE 40 MCG/ML (10ML) SYRINGE FOR IV PUSH (FOR BLOOD PRESSURE SUPPORT)
PREFILLED_SYRINGE | INTRAVENOUS | Status: DC | PRN
  Administered 2019-03-13: 120 ug via INTRAVENOUS
  Administered 2019-03-13: 40 ug via INTRAVENOUS
  Administered 2019-03-13 (×4): 120 ug via INTRAVENOUS

## 2019-03-13 MED ORDER — HYDROCODONE-ACETAMINOPHEN 5-325 MG PO TABS: 1.0000 | ORAL_TABLET | ORAL | Status: DC | PRN

## 2019-03-13 MED ORDER — CEFAZOLIN SODIUM-DEXTROSE 2-4 GM/100ML-% IV SOLN
2.0000 g | INTRAVENOUS | Status: AC
  Administered 2019-03-13: 2 g via INTRAVENOUS

## 2019-03-13 MED ORDER — SERTRALINE HCL 50 MG PO TABS
50.0000 mg | ORAL_TABLET | Freq: Every day | ORAL | Status: DC
  Administered 2019-03-14: 50 mg via ORAL
  Filled 2019-03-13: qty 1

## 2019-03-13 MED ORDER — ONDANSETRON HCL 4 MG/2ML IJ SOLN
INTRAMUSCULAR | Status: DC | PRN
  Administered 2019-03-13: 4 mg via INTRAVENOUS

## 2019-03-13 MED ORDER — ESMOLOL HCL 100 MG/10ML IV SOLN
INTRAVENOUS | Status: DC | PRN
  Administered 2019-03-13: 50 mg via INTRAVENOUS
  Administered 2019-03-13: 30 mg via INTRAVENOUS
  Administered 2019-03-13: 50 mg via INTRAVENOUS
  Administered 2019-03-13: 30 mg via INTRAVENOUS
  Administered 2019-03-13: 40 mg via INTRAVENOUS

## 2019-03-13 MED ORDER — LABETALOL HCL 5 MG/ML IV SOLN: 10.0000 mg | INTRAVENOUS | Status: DC | PRN

## 2019-03-13 MED ORDER — SODIUM CHLORIDE 0.9 % IV SOLN
INTRAVENOUS | Status: DC | PRN
  Administered 2019-03-13: 40 ug/min via INTRAVENOUS

## 2019-03-13 NOTE — Transfer of Care (Signed)
Immediate Anesthesia Transfer of Care Note  Patient: Johnathan Bauer  Procedure(s) Performed: Pipeline-Medtronic (N/A )  Patient Location: PACU  Anesthesia Type:General  Level of Consciousness: awake, alert  and oriented  Airway & Oxygen Therapy: Patient Spontanous Breathing and Patient connected to face mask oxygen  Post-op Assessment: Report given to RN, Post -op Vital signs reviewed and stable, Patient moving all extremities, Patient moving all extremities X 4 and Patient able to stick tongue midline  Post vital signs: Reviewed and stable  Last Vitals:  Vitals Value Taken Time  BP 121/81 ABP   Temp    Pulse 92   Resp 16   SpO2 98     Last Pain:  Vitals:   03/13/19 0845  TempSrc:   PainSc: 0-No pain      Patients Stated Pain Goal: 6 (71/59/53 9672)  Complications: No apparent anesthesia complications

## 2019-03-13 NOTE — Anesthesia Procedure Notes (Signed)
Procedure Name: Intubation Date/Time: 03/13/2019 10:46 AM Performed by: Jearld Pies, CRNA Pre-anesthesia Checklist: Patient identified, Emergency Drugs available, Suction available and Patient being monitored Patient Re-evaluated:Patient Re-evaluated prior to induction Oxygen Delivery Method: Circle System Utilized Preoxygenation: Pre-oxygenation with 100% oxygen Induction Type: IV induction Ventilation: Mask ventilation without difficulty and Oral airway inserted - appropriate to patient size Laryngoscope Size: Mac and 4 Grade View: Grade II Tube type: Oral Tube size: 7.5 mm Number of attempts: 1 Airway Equipment and Method: Stylet and Oral airway Placement Confirmation: ETT inserted through vocal cords under direct vision,  positive ETCO2 and breath sounds checked- equal and bilateral Secured at: 23 cm Tube secured with: Tape Dental Injury: Teeth and Oropharynx as per pre-operative assessment  Comments: Grade 2b view

## 2019-03-13 NOTE — Anesthesia Postprocedure Evaluation (Signed)
Anesthesia Post Note  Patient: Johnathan Bauer  Procedure(s) Performed: Pipeline-Medtronic (N/A )     Patient location during evaluation: PACU Anesthesia Type: General Level of consciousness: sedated Pain management: pain level controlled Vital Signs Assessment: post-procedure vital signs reviewed and stable Respiratory status: spontaneous breathing and respiratory function stable Cardiovascular status: stable Postop Assessment: no apparent nausea or vomiting Anesthetic complications: no    Last Vitals:  Vitals:   03/13/19 1445 03/13/19 1500  BP:    Pulse: 82 85  Resp: 11 16  Temp: (!) 36.1 C   SpO2: 100% 100%    Last Pain:  Vitals:   03/13/19 1445  TempSrc:   PainSc: 0-No pain                 Eddie Koc DANIEL

## 2019-03-13 NOTE — Progress Notes (Addendum)
RN witnessed patient having what appeared to be a seizure.  Patient was unresponsive for 2-3 minutes.  Patient regained consciousness and is now following commands but is confused.  Immediately alerted on call NSG MD and was given orders to take patient for a stat Ct headscan and also give 500 mg keppra via IV.

## 2019-03-13 NOTE — Progress Notes (Signed)
Pt admitted with clothing, cell phone, glasses, wedding ring, and wrist watch. All at bedside and verified by Lurline Hare and TK RN.

## 2019-03-13 NOTE — Anesthesia Procedure Notes (Signed)
Arterial Line Insertion Start/End7/03/2019 9:20 AM, 03/13/2019 9:29 AM Performed by: Jearld Pies, CRNA, CRNA  Patient location: Pre-op. Preanesthetic checklist: patient identified, IV checked, site marked, risks and benefits discussed, surgical consent, monitors and equipment checked, pre-op evaluation, timeout performed and anesthesia consent Lidocaine 1% used for infiltration Left, radial was placed Catheter size: 20 G Hand hygiene performed , maximum sterile barriers used  and Seldinger technique used Allen's test indicative of satisfactory collateral circulation Attempts: 1 Procedure performed without using ultrasound guided technique. Following insertion, dressing applied and Biopatch. Post procedure assessment: normal  Patient tolerated the procedure well with no immediate complications.

## 2019-03-13 NOTE — Brief Op Note (Signed)
  NEUROSURGERY BRIEF OPERATIVE  NOTE   PREOP DX: LICA aneurysm  POSTOP DX: Same  PROCEDURE: Pipeline embolization of LICA aneurysm  SURGEON: Dr. Consuella Lose, MD  ANESTHESIA: GETA  EBL: Minimal  SPECIMENS: None  COMPLICATIONS: None  CONDITION: Hemodynamically stable to recovery  FINDINGS (Full report in [facility]): 1. Successful Pipeline embolization of LICA aneurysm. No immediate local thrombotic or distal embolic complications were noted.

## 2019-03-13 NOTE — Progress Notes (Signed)
Pt seen and examined after extubation in the room. Arouses easily, follows commands symmetrically in all extremities. Will transfer to PACU then obs in ICU, plan on goal SBP < 130mmHg. Cont daily ASA/Plavix (next dose tomorrow).

## 2019-03-13 NOTE — Sedation Documentation (Signed)
Pt under the care of anesthesia  

## 2019-03-14 ENCOUNTER — Encounter (HOSPITAL_COMMUNITY): Payer: Self-pay | Admitting: Neurosurgery

## 2019-03-14 MED ORDER — LEVETIRACETAM 500 MG PO TABS: 500.0000 mg | ORAL_TABLET | Freq: Two times a day (BID) | ORAL | 2 refills | Status: DC

## 2019-03-14 NOTE — Progress Notes (Signed)
  NEUROSURGERY PROGRESS NOTE   Questionable seizure like activity last night. CT head ordered, normal. Started on Keppra. No episodes since. Feels well. Denies HA, dizziness, N/T  EXAM:  BP 93/62   Pulse (!) 57   Temp 98 F (36.7 C) (Oral)   Resp 19   Ht 6' (1.829 m)   Wt 72.6 kg   SpO2 99%   BMI 21.70 kg/m   Awake, alert, oriented  Speech fluent, appropriate  CN grossly intact  5/5 BUE/BLE  Site access: no swelling/hematoma  IMPRESSION/PLAN 68 y.o. male pod #1 pipiline embolization LICA. ?Seizure overnight. Will continue Keppra. D/c Home

## 2019-03-14 NOTE — Discharge Summary (Signed)
Physician Discharge Summary Patient ID: Johnathan Bauer MRN: [id number] DOB/AGE: 68-May-1952 68 y.o.  Admit date: 03/13/2019 Discharge date: 03/14/2019  Admission Diagnoses:  nonruptured cerebral aneurysm   Discharge Diagnoses:  Same Active Problems:   Cerebral aneurysm, nonruptured  Discharged Condition: Stable  Hospital Course:  Johnathan Bauer is a 68 y.o. male who was admitted for the below procedure. Post op complicated by ?seizure. Head CT unrevealing. Started on Keppra. No further episodes. Cleared for discharge. At time of discharge, pain was well controlled, ambulating with Pt/OT, tolerating po, voiding normal. Ready for discharge.  Treatments: Surgery - pipeline embolization LICA  Discharge Exam: Blood pressure 93/62, pulse (!) 57, temperature 98 F (36.7 C), temperature source Oral, resp. rate 19, height 6' (1.829 m), weight 72.6 kg, SpO2 99 %. Awake, alert, oriented Speech fluent, appropriate CN grossly intact 5/5 BUE/BLE Wound c/d/i  Disposition: Discharge disposition: 01-Home or Self Care       Discharge Instructions   Call MD for:  difficulty breathing, headache or visual disturbances   Complete by: As directed   Call MD for:  persistant dizziness or light-headedness   Complete by: As directed   Call MD for:  redness, tenderness, or signs of infection (pain, swelling, redness, odor or green/yellow discharge around incision site)   Complete by: As directed   Call MD for:  severe uncontrolled pain   Complete by: As directed   Call MD for:  temperature >100.4   Complete by: As directed   Diet general   Complete by: As directed   Driving Restrictions   Complete by: As directed   Do not drive until given clearance.  Increase activity slowly   Complete by: As directed   Lifting restrictions   Complete by: As directed   Do not lift anything >10lbs. Avoid bending and twisting in awkward positions. Avoid bending at the back.  May shower / Bathe   Complete  by: As directed   In 24 hours. Okay to wash wound with warm soapy water. Avoid scrubbing the wound. Pat dry.  Remove dressing in 24 hours   Complete by: As directed    Allergies as of 03/14/2019     Reactions  Codeine Nausea And Vomiting  Poison Ivy Extract [poison Ivy Extract] Hives   Medication List  TAKE these medications  albuterol 108 (90 Base) MCG/ACT inhaler Commonly known as: VENTOLIN HFA Inhale 2-4 puffs into the lungs every 6 (six) hours as needed for shortness of breath or wheezing.  aspirin EC 325 MG tablet Take 325 mg by mouth daily.  clopidogrel 75 MG tablet Commonly known as: PLAVIX Take 75 mg by mouth daily.  digoxin 0.25 MG tablet Commonly known as: LANOXIN TAKE 1 TABLET (250 MCG TOTAL) BY MOUTH DAILY. What changed:  how much to take how to take this when to take this additional instructions  levETIRAcetam 500 MG tablet Commonly known as: Keppra Take 1 tablet (500 mg total) by mouth 2 (two) times daily.  metoprolol tartrate 50 MG tablet Commonly known as: LOPRESSOR TAKE 1 TABLET BY MOUTH EVERY DAY  nitroGLYCERIN 0.4 MG SL tablet Commonly known as: NITROSTAT Place 1 tablet (0.4 mg total) under the tongue every 5 (five) minutes as needed for chest pain.  sertraline 50 MG tablet Commonly known as: ZOLOFT Take 50 mg by mouth daily.    Follow-up Information    Lisbeth Renshaw, MD. Schedule an appointment as soon as possible for a visit in 3 week(s).  Specialty: Neurosurgery Contact  information: [address] [place] [address] [phone]         Signed: Alyson Ingles 03/14/2019, 9:39 AM

## 2019-03-19 ENCOUNTER — Telehealth: Payer: Self-pay | Admitting: *Deleted

## 2019-03-19 NOTE — Telephone Encounter (Signed)
Pt recently had a stent put in to bypass the embolism. Pt wants to know if he should be on the plavix and Xarelto together? Please advise.

## 2019-03-20 NOTE — Telephone Encounter (Signed)
Telephone call to patient. Spoke with wife who states the discharge instructions for the hospital does not have the xarelto on it. Spoke with [employer] walker NP-C who says this should be guided by the neurosurgeon for now. Wife states will call the neurosurgeon.

## 2019-03-28 DIAGNOSIS — I671 Cerebral aneurysm, nonruptured: Secondary | ICD-10-CM | POA: Diagnosis not present

## 2019-04-07 ENCOUNTER — Telehealth: Payer: Self-pay | Admitting: Cardiology

## 2019-04-07 NOTE — Telephone Encounter (Signed)
Patient called in reporting he cut his arm on a tree branch yesterday. Now with swelling and bleeding. Currently on ASA, plavix and xarelto. Hx of chronic afib. Recent aneurysm with stenting with neurosurgery who placed him on the plavix/asa. Bleeding is controlled, reports mostly concerned with the swelling. Advised precautions for s/s of infections. He states he currently has no symptoms. Instructed to be seen if develops and remain on current medications per neurosurgery recommendations. Voiced understanding. Thanked me for follow up call back.   Johnathan Bauer.

## 2019-04-30 DIAGNOSIS — R233 Spontaneous ecchymoses: Secondary | ICD-10-CM | POA: Diagnosis not present

## 2019-04-30 DIAGNOSIS — L821 Other seborrheic keratosis: Secondary | ICD-10-CM | POA: Diagnosis not present

## 2019-05-09 DIAGNOSIS — I671 Cerebral aneurysm, nonruptured: Secondary | ICD-10-CM | POA: Diagnosis not present

## 2019-06-05 ENCOUNTER — Other Ambulatory Visit: Payer: Self-pay | Admitting: Cardiology

## 2019-06-08 ENCOUNTER — Inpatient Hospital Stay (HOSPITAL_BASED_OUTPATIENT_CLINIC_OR_DEPARTMENT_OTHER)
Admission: EM | Admit: 2019-06-08 | Discharge: 2019-06-13 | DRG: 378 | Disposition: A | Discharge status: Home / Self Care | Payer: PPO | Attending: Internal Medicine | Admitting: Internal Medicine

## 2019-06-08 ENCOUNTER — Encounter (HOSPITAL_BASED_OUTPATIENT_CLINIC_OR_DEPARTMENT_OTHER): Payer: Self-pay

## 2019-06-08 ENCOUNTER — Ambulatory Visit (INDEPENDENT_AMBULATORY_CARE_PROVIDER_SITE_OTHER): Payer: PPO | Admitting: Family

## 2019-06-08 ENCOUNTER — Encounter: Payer: Self-pay | Admitting: Family

## 2019-06-08 ENCOUNTER — Other Ambulatory Visit: Payer: Self-pay

## 2019-06-08 VITALS — BP 108/60 | HR 83 | Resp 18 | Ht 72.0 in | Wt 159.0 lb

## 2019-06-08 DIAGNOSIS — F17210 Nicotine dependence, cigarettes, uncomplicated: Secondary | ICD-10-CM | POA: Diagnosis not present

## 2019-06-08 DIAGNOSIS — D649 Anemia, unspecified: Secondary | ICD-10-CM | POA: Diagnosis not present

## 2019-06-08 DIAGNOSIS — Z7982 Long term (current) use of aspirin: Secondary | ICD-10-CM

## 2019-06-08 DIAGNOSIS — Z7902 Long term (current) use of antithrombotics/antiplatelets: Secondary | ICD-10-CM

## 2019-06-08 DIAGNOSIS — I951 Orthostatic hypotension: Secondary | ICD-10-CM

## 2019-06-08 DIAGNOSIS — I2510 Atherosclerotic heart disease of native coronary artery without angina pectoris: Secondary | ICD-10-CM

## 2019-06-08 DIAGNOSIS — Z8673 Personal history of transient ischemic attack (TIA), and cerebral infarction without residual deficits: Secondary | ICD-10-CM | POA: Diagnosis not present

## 2019-06-08 DIAGNOSIS — Z885 Allergy status to narcotic agent status: Secondary | ICD-10-CM

## 2019-06-08 DIAGNOSIS — I10 Essential (primary) hypertension: Secondary | ICD-10-CM | POA: Diagnosis not present

## 2019-06-08 DIAGNOSIS — I129 Hypertensive chronic kidney disease with stage 1 through stage 4 chronic kidney disease, or unspecified chronic kidney disease: Secondary | ICD-10-CM | POA: Diagnosis present

## 2019-06-08 DIAGNOSIS — K2980 Duodenitis without bleeding: Secondary | ICD-10-CM | POA: Diagnosis present

## 2019-06-08 DIAGNOSIS — I482 Chronic atrial fibrillation, unspecified: Secondary | ICD-10-CM | POA: Diagnosis not present

## 2019-06-08 DIAGNOSIS — Z7901 Long term (current) use of anticoagulants: Secondary | ICD-10-CM | POA: Diagnosis not present

## 2019-06-08 DIAGNOSIS — Z888 Allergy status to other drugs, medicaments and biological substances status: Secondary | ICD-10-CM | POA: Diagnosis not present

## 2019-06-08 DIAGNOSIS — R0602 Shortness of breath: Secondary | ICD-10-CM | POA: Diagnosis not present

## 2019-06-08 DIAGNOSIS — E538 Deficiency of other specified B group vitamins: Secondary | ICD-10-CM | POA: Diagnosis present

## 2019-06-08 DIAGNOSIS — K921 Melena: Secondary | ICD-10-CM

## 2019-06-08 DIAGNOSIS — R42 Dizziness and giddiness: Secondary | ICD-10-CM | POA: Diagnosis not present

## 2019-06-08 DIAGNOSIS — N189 Chronic kidney disease, unspecified: Secondary | ICD-10-CM | POA: Diagnosis present

## 2019-06-08 DIAGNOSIS — K2961 Other gastritis with bleeding: Secondary | ICD-10-CM | POA: Diagnosis not present

## 2019-06-08 DIAGNOSIS — K2901 Acute gastritis with bleeding: Principal | ICD-10-CM | POA: Diagnosis present

## 2019-06-08 DIAGNOSIS — Z8042 Family history of malignant neoplasm of prostate: Secondary | ICD-10-CM | POA: Diagnosis not present

## 2019-06-08 DIAGNOSIS — K922 Gastrointestinal hemorrhage, unspecified: Secondary | ICD-10-CM

## 2019-06-08 DIAGNOSIS — Z807 Family history of other malignant neoplasms of lymphoid, hematopoietic and related tissues: Secondary | ICD-10-CM | POA: Diagnosis not present

## 2019-06-08 DIAGNOSIS — K2950 Unspecified chronic gastritis without bleeding: Secondary | ICD-10-CM | POA: Diagnosis not present

## 2019-06-08 DIAGNOSIS — I671 Cerebral aneurysm, nonruptured: Secondary | ICD-10-CM

## 2019-06-08 DIAGNOSIS — Z720 Tobacco use: Secondary | ICD-10-CM

## 2019-06-08 DIAGNOSIS — D62 Acute posthemorrhagic anemia: Secondary | ICD-10-CM | POA: Diagnosis present

## 2019-06-08 DIAGNOSIS — Z91048 Other nonmedicinal substance allergy status: Secondary | ICD-10-CM | POA: Diagnosis not present

## 2019-06-08 DIAGNOSIS — D508 Other iron deficiency anemias: Secondary | ICD-10-CM | POA: Diagnosis not present

## 2019-06-08 DIAGNOSIS — R195 Other fecal abnormalities: Secondary | ICD-10-CM

## 2019-06-08 DIAGNOSIS — Z20828 Contact with and (suspected) exposure to other viral communicable diseases: Secondary | ICD-10-CM | POA: Diagnosis not present

## 2019-06-08 DIAGNOSIS — M19041 Primary osteoarthritis, right hand: Secondary | ICD-10-CM | POA: Diagnosis present

## 2019-06-08 HISTORY — DX: Gastrointestinal hemorrhage, unspecified: K92.2

## 2019-06-08 LAB — COMPREHENSIVE METABOLIC PANEL
ALT: 15 U/L (ref 0–44)
AST: 23 U/L (ref 15–41)
Albumin: 3.9 g/dL (ref 3.5–5.0)
Alkaline Phosphatase: 65 U/L (ref 38–126)
Anion gap: 11 (ref 5–15)
BUN: 42 mg/dL — ABNORMAL HIGH (ref 8–23)
CO2: 24 mmol/L (ref 22–32)
Calcium: 9.6 mg/dL (ref 8.9–10.3)
Chloride: 100 mmol/L (ref 98–111)
Creatinine, Ser: 1.13 mg/dL (ref 0.61–1.24)
GFR calc Af Amer: >60 mL/min (ref >60)
GFR calc non Af Amer: >60 mL/min (ref >60)
Glucose, Bld: 124 mg/dL — ABNORMAL HIGH (ref 70–99)
Potassium: 4.3 mmol/L (ref 3.5–5.1)
Sodium: 135 mmol/L (ref 135–145)
Total Bilirubin: 0.5 mg/dL (ref 0.3–1.2)
Total Protein: 7 g/dL (ref 6.5–8.1)

## 2019-06-08 LAB — CBC WITH DIFFERENTIAL/PLATELET
Abs Immature Granulocytes: 0.04 10*3/uL (ref 0.00–0.07)
Basophils Absolute: 0 10*3/uL (ref 0.0–0.1)
Basophils Relative: 0 %
Eosinophils Absolute: 0.1 10*3/uL (ref 0.0–0.5)
Eosinophils Relative: 1 %
HCT: 39.2 % (ref 39.0–52.0)
Hemoglobin: 13 g/dL (ref 13.0–17.0)
Immature Granulocytes: 0 %
Lymphocytes Relative: 21 %
Lymphs Abs: 1.9 10*3/uL (ref 0.7–4.0)
MCH: 33.2 pg (ref 26.0–34.0)
MCHC: 33.2 g/dL (ref 30.0–36.0)
MCV: 100.3 fL — ABNORMAL HIGH (ref 80.0–100.0)
Monocytes Absolute: 1 10*3/uL (ref 0.1–1.0)
Monocytes Relative: 11 %
Neutro Abs: 6 10*3/uL (ref 1.7–7.7)
Neutrophils Relative %: 67 %
Platelets: 244 10*3/uL (ref 150–400)
RBC: 3.91 MIL/uL — ABNORMAL LOW (ref 4.22–5.81)
RDW: 14.4 % (ref 11.5–15.5)
WBC: 9 10*3/uL (ref 4.0–10.5)
nRBC: 0 % (ref 0.0–0.2)

## 2019-06-08 LAB — OCCULT BLOOD X 1 CARD TO LAB, STOOL: Fecal Occult Bld: POSITIVE — AB

## 2019-06-08 LAB — SARS CORONAVIRUS 2 BY RT PCR (HOSPITAL ORDER, PERFORMED IN ~~LOC~~ HOSPITAL LAB): SARS Coronavirus 2: NEGATIVE

## 2019-06-08 LAB — PROTIME-INR
INR: 2.1 — ABNORMAL HIGH (ref 0.8–1.2)
Prothrombin Time: 23.6 seconds — ABNORMAL HIGH (ref 11.4–15.2)

## 2019-06-08 MED ORDER — PANTOPRAZOLE SODIUM 40 MG IV SOLR: 40.0000 mg | Freq: Two times a day (BID) | INTRAVENOUS | Status: DC

## 2019-06-08 MED ORDER — SODIUM CHLORIDE 0.9 % IV BOLUS
1000.0000 mL | Freq: Once | INTRAVENOUS | Status: AC
  Administered 2019-06-08: 1000 mL via INTRAVENOUS

## 2019-06-08 MED ORDER — SODIUM CHLORIDE 0.9 % IV SOLN
INTRAVENOUS | Status: AC
  Administered 2019-06-08: 23:00:00 via INTRAVENOUS

## 2019-06-08 MED ORDER — ACETAMINOPHEN 325 MG PO TABS: 650.0000 mg | ORAL_TABLET | Freq: Four times a day (QID) | ORAL | Status: DC | PRN

## 2019-06-08 MED ORDER — ASPIRIN 325 MG PO TABS
325.0000 mg | ORAL_TABLET | Freq: Every day | ORAL | Status: DC
  Administered 2019-06-09 – 2019-06-13 (×5): 325 mg via ORAL
  Filled 2019-06-08 (×6): qty 1

## 2019-06-08 MED ORDER — PANTOPRAZOLE SODIUM 40 MG IV SOLR
INTRAVENOUS | Status: AC
  Filled 2019-06-08: qty 80

## 2019-06-08 MED ORDER — ACETAMINOPHEN 650 MG RE SUPP: 650.0000 mg | Freq: Four times a day (QID) | RECTAL | Status: DC | PRN

## 2019-06-08 MED ORDER — ONDANSETRON HCL 4 MG PO TABS: 4.0000 mg | ORAL_TABLET | Freq: Four times a day (QID) | ORAL | Status: DC | PRN

## 2019-06-08 MED ORDER — CLOPIDOGREL BISULFATE 75 MG PO TABS
75.0000 mg | ORAL_TABLET | Freq: Every day | ORAL | Status: DC
  Administered 2019-06-09: 75 mg via ORAL
  Filled 2019-06-08: qty 1

## 2019-06-08 MED ORDER — SODIUM CHLORIDE 0.9 % IV SOLN
8.0000 mg/h | INTRAVENOUS | Status: AC
  Administered 2019-06-08 – 2019-06-10 (×7): 8 mg/h via INTRAVENOUS
  Filled 2019-06-08 (×9): qty 80

## 2019-06-08 MED ORDER — SODIUM CHLORIDE 0.9 % IV SOLN
INTRAVENOUS | Status: DC | PRN
  Administered 2019-06-08: 500 mL via INTRAVENOUS
  Administered 2019-06-09: 1000 mL via INTRAVENOUS

## 2019-06-08 MED ORDER — ONDANSETRON HCL 4 MG/2ML IJ SOLN: 4.0000 mg | Freq: Four times a day (QID) | INTRAMUSCULAR | Status: DC | PRN

## 2019-06-08 NOTE — Progress Notes (Signed)
Office Visit    Patient Name: Johnathan Bauer Date of Encounter: 06/08/2019  Primary Care Provider:  Enid Skeens., MD Primary Cardiologist:  No primary care provider on file. Electrophysiologist:  None   Chief Complaint    Johnathan Bauer is a 68 y.o. male with a hx of CAD, CVA, brain aneurysm stenting, chronic atrial fibrillation on anticoagulation presents today for dizziness and hypotension.   Past Medical History    Past Medical History:  Diagnosis Date  . Arthritis    "touch in my right thumb" (12/31/2014)  . Chronic atrial fibrillation (HCC)    did not like coumadin  . Chronic kidney disease 09/2012  . Coronary artery disease    s/p PCI Sept 2013 with DES OM  . Diverticulitis with perforation   . Dysrhythmia    A-fib  . Pneumonia 09/2012  . Stroke [facility]) 04/2016   Past Surgical History:  Procedure Laterality Date  . COLON SURGERY    . COLOSTOMY REVERSAL  06/06/2013   Dr Rosendo Gros  . COLOSTOMY REVISION  09/02/2012   Procedure: COLON RESECTION SIGMOID;  Surgeon: Serafina Mitchell, MD;  Location: [facility] OR;  Service: Vascular;  Laterality: N/A;  Sigmoid resection with End Colostomy.  . COLOSTOMY TAKEDOWN N/A 06/06/2013   Procedure: LAPAROSCOPIC OSTOMY TAKEDOWN AND ANASTOMOSIS ;  Surgeon: Ralene Ok, MD;  Location: [place];  Service: General;  Laterality: N/A;  . CORONARY ANGIOPLASTY WITH STENT PLACEMENT  06/2012   "1"  . HERNIA REPAIR    . INCISIONAL HERNIA REPAIR  12/31/2014   w/mesh  . INCISIONAL HERNIA REPAIR N/A 12/31/2014   Procedure: OPEN INCISIONAL HERNIA REPAIR WITH MESH (TAR PROCEDURE);  Surgeon: Ralene Ok, MD;  Location: [place];  Service: General;  Laterality: N/A;  . INSERTION OF MESH N/A 12/31/2014   Procedure: INSERTION OF MESH;  Surgeon: Ralene Ok, MD;  Location: [place];  Service: General;  Laterality: N/A;  . IR 3D INDEPENDENT WKST  01/18/2019  . IR ANGIO INTRA EXTRACRAN SEL INTERNAL CAROTID BILAT MOD SED  01/18/2019  . IR ANGIO INTRA EXTRACRAN  SEL INTERNAL CAROTID UNI L MOD SED  03/13/2019  . IR ANGIOGRAM EXTREMITY LEFT  01/18/2019  . IR ANGIOGRAM FOLLOW UP STUDY  03/13/2019  . IR TRANSCATH/EMBOLIZ  03/13/2019  . LAPAROTOMY  09/02/2012   Procedure: EXPLORATORY LAPAROTOMY;  Surgeon: Serafina Mitchell, MD;  Location: [facility] OR;  Service: Vascular;  Laterality: N/A;  Exploratory Laparotomy with Sigmoid resection with end colocstomy.  Marland Kitchen RADIOLOGY WITH ANESTHESIA N/A 03/13/2019   Procedure: P[employer]tr[employer];  Surgeon: Consuella Lose, MD;  Location: [place];  Service: Radiology;  Laterality: N/A;  . TONSILLECTOMY      Allergies  Allergies  Allergen Reactions  . Beef-Derived Products Hives    Has to watch how much beef he eats  . Codeine Nausea And Vomiting  . Poison Ivy Extract [Poison Ivy Extract] Hives    History of Present Illness    Johnathan Bauer is a 68 y.o. male with a hx of CAD, chronic atrial fibrillation on anticoagulation, CVA, brain aneurysm stentint last seen 06/23/18 by Dr. Agustin Cree.  He was admitted 03/13/19 for aneurysm stenting by neurosurgery.   Been little dizzy for last 3-4 days.Played some tennis Saturday, Sunday noticed some dizziness. Yesterday evening went out to eat and "world started spinning really bad".   Dizziness feels like he needs to sit down and some spinning. Yesterday and today blood pressures have been 80s-90s. Has been drinking Glucerna -  has a few teeth so chewing is difficult. Has been doing 70 calories less of the Glucerna because they've had to purchase a different bottle size.   Report 3-4 days has had dark stool, between dark brown and black, reports stool consistently has been sometimes loose, [place] stage 4-5.   DOE for example taking garbage out, walking stairs. Onset this week as well.   EKGs/Labs/Other Studies Reviewed:   The following studies were reviewed today:  EKG:  No EKG today.  Recent Labs: 01/10/2019: ALT 20 03/08/2019: BUN 15; Creatinine, Ser 1.13; Hemoglobin 17.1; Platelets  245; Potassium 4.8; Sodium 140  Recent Lipid Panel    Component Value Date/Time   CHOL 152 06/23/2018 1605   TRIG 83 06/23/2018 1605   HDL 65 06/23/2018 1605   CHOLHDL 2.3 06/23/2018 1605   LDLCALC 70 06/23/2018 1605    Home Medications   Current Meds  Medication Sig  . albuterol (VENTOLIN HFA) 108 (90 Base) MCG/ACT inhaler Inhale 2-4 puffs into the lungs every 6 (six) hours as needed for shortness of breath or wheezing.  Marland Kitchen aspirin EC 325 MG tablet Take 325 mg by mouth daily.   . clopidogrel (PLAVIX) 75 MG tablet Take 75 mg by mouth daily.  . digoxin (LANOXIN) 0.25 MG tablet TAKE 1 TABLET (250 MCG TOTAL) BY MOUTH DAILY. (Patient taking differently: Take 0.25 mg by mouth daily. )  . metoprolol tartrate (LOPRESSOR) 50 MG tablet TAKE 1 TABLET BY MOUTH EVERY DAY  . nitroGLYCERIN (NITROSTAT) 0.4 MG SL tablet Place 1 tablet (0.4 mg total) under the tongue every 5 (five) minutes as needed for chest pain.  Marland Kitchen sertraline (ZOLOFT) 50 MG tablet Take 50 mg by mouth daily.  Alveda Reasons 20 MG TABS tablet Take 20 mg by mouth daily.   Current Facility-Administered Medications for the 06/08/19 encounter (Office Visit) with Loel Dubonnet, NP  Medication  . 0.9 %  sodium chloride infusion     Review of Systems      Review of Systems  Constitution: Negative for chills, fever and malaise/fatigue.  Cardiovascular: Positive for dyspnea on exertion and near-syncope. Negative for chest pain, leg swelling and palpitations.  Respiratory: Negative for cough, shortness of breath and wheezing.   Gastrointestinal: Positive for melena. Negative for nausea and vomiting.  Neurological: Positive for dizziness and light-headedness. Negative for weakness.  Psychiatric/Behavioral: Negative for altered mental status. The patient is not nervous/anxious.    All other systems reviewed and are otherwise negative except as noted above.  Physical Exam    VS:  BP 108/60 (BP Location: Left Arm, Patient Position:  Sitting, Cuff Size: Normal)   Pulse 83   Resp 18   Ht 6' (1.829 m)   Wt 159 lb (72.1 kg)   SpO2 100%   BMI 21.56 kg/m  , BMI Body mass index is 21.56 kg/m. GEN: Well nourished, well developed, in no acute distress. HEENT: normal. Neck: Supple, no JVD, carotid bruits, or masses. Cardiac: irregularly irregular, no murmurs, rubs, or gallops. No clubbing, cyanosis, edema.  Radials/DP/PT 2+ and equal bilaterally.  Respiratory:  Respirations regular and unlabored, clear to auscultation bilaterally.Diminished bases bilaterally.  GI: Soft, nontender, nondistended, BS + x 4. MS: No deformity or atrophy. Skin: Warm and dry, no rash. Neuro:  Strength and sensation are intact. Psych: Normal affect.  Assessment & Plan    Mr. Maidonado is a 68 year old mail with 3-4 day history of dizziness, lightheadedness, dark stools, decreased oral intake. Concern for GI bleed  due to Plavix 75mg , Aspirin 325mg , and Xarelto 20mg  daily due to chronic atrial fibrillation and recent brain aneurysm stenting with no PPI. Concern for digoxin toxicity as level not checked in our system since 2014, goal of Digoxin level <1 to prevent side effects. Additional etiology orthostatic hypotension - orthostatic vital signs positive today with BP 108/60 sitting and 90/58 standing.   Due to need for urgent evaluation, he was walked to the ED downstairs with his wife.   Of note, he has resumed smoking 1 cigarette about twice weekly. Smoking cessation encouraged.   Disposition: Escorted to [facility] ED for evaluation of dizziness, DOE with concern for GI bleed.  Loel Dubonnet, NP 06/08/2019, 1:58 PM

## 2019-06-08 NOTE — ED Notes (Signed)
Patient has a ready bed at [facility], [facility] called for transport. Patient on list for transport.

## 2019-06-08 NOTE — ED Provider Notes (Signed)
[facility] EMERGENCY DEPARTMENT Provider Note   CSN: [id number] Arrival date & time: 06/08/19  1406     History   Chief Complaint Chief Complaint  Patient presents with  . Dizziness    HPI Johnathan Bauer is a 68 y.o. male who presents with lightheadedness.  Past medical history significant for coronary artery disease, chronic atrial fibrillation, cerebral aneurysm status post stenting, history of CVA, chronic kidney disease.  Patient was having a visit with cardiology upstairs and was sent to the ED for hypotension and lightheadedness with dark stools for 4 days.  Patient states that he started to get dizzy with standing about a week ago.  This would come and go.  About 3 to 4 days ago he started to have dark bowel movements.  He is also been short of breath with exertion.  He is on Xarelto, Plavix, and aspirin for his multiple medical problems.  He supposed to come off of Plavix this upcoming Tuesday as it will be 3 months after his stent for aneurysm was placed.  He denies fever, chills, chest pain, abdominal pain, nausea, vomiting.  He has had a colonoscopy and endoscopy before by Dr. Ardis Hughs with low Exie Parody GI.  He is not on a PPI.  He does not take ibuprofen or Aleve.     HPI  Past Medical History:  Diagnosis Date  . Arthritis    "touch in my right thumb" (12/31/2014)  . Chronic atrial fibrillation (HCC)    did not like coumadin  . Chronic kidney disease 09/2012  . Coronary artery disease    s/p PCI Sept 2013 with DES OM  . Diverticulitis with perforation   . Dysrhythmia    A-fib  . Pneumonia 09/2012  . Stroke [facility]) 04/2016    Patient Active Problem List   Diagnosis Date Noted  . Cerebral aneurysm, nonruptured 03/13/2019  . Vision changes 01/10/2019  . Stroke-like symptoms 01/10/2019  . History of stroke 01/10/2019  . Blurry vision, bilateral 01/10/2019  . Dyspnea on exertion 07/08/2017  . History of colonic polyps 11/30/2016  . Chronic anticoagulation  11/30/2016  . Alcohol abuse 05/25/2016  . 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 Coronary artery disease involving native coronary artery of native heart without angina pectoris 05/20/2015  . Essential hypertension 05/20/2015  . S/P hernia repair 12/31/2014  . Respiratory failure, post-operative ([place]) 09/07/2012  . NSVT, 7 beat run 1/02 09/07/2012  . Chronic atrial fibrillation ([place]): Anticoagulated his chads 2 Vas score equals 3 09/03/2012  . 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 Perforated sigmoid colon ([place]) 09/03/2012  . Diverticulitis of sigmoid colon 09/02/2012    Past Surgical History:  Procedure Laterality Date  . COLON SURGERY    . COLOSTOMY REVERSAL  06/06/2013   Dr Rosendo Gros  . COLOSTOMY REVISION  09/02/2012   Procedure: COLON RESECTION SIGMOID;  Surgeon: Serafina Mitchell, MD;  Location: [facility] OR;  Service: Vascular;  Laterality: N/A;  Sigmoid resection with End Colostomy.  . COLOSTOMY TAKEDOWN N/A 06/06/2013   Procedure: LAPAROSCOPIC OSTOMY TAKEDOWN AND ANASTOMOSIS ;  Surgeon: Ralene Ok, MD;  Location: [place];  Service: General;  Laterality: N/A;  . CORONARY ANGIOPLASTY WITH STENT PLACEMENT  06/2012   "1"  . HERNIA REPAIR    . INCISIONAL HERNIA REPAIR  12/31/2014   w/mesh  . INCISIONAL HERNIA REPAIR N/A 12/31/2014   Procedure: OPEN INCISIONAL HERNIA REPAIR WITH MESH (TAR PROCEDURE);  Surgeon: Ralene Ok,  MD;  Location: [place];  Service: General;  Laterality: N/A;  . INSERTION OF MESH N/A 12/31/2014   Procedure: INSERTION OF MESH;  Surgeon: Ralene Ok, MD;  Location: [place];  Service: General;  Laterality: N/A;  . IR 3D INDEPENDENT WKST  01/18/2019  . IR ANGIO INTRA EXTRACRAN SEL INTERNAL CAROTID BILAT MOD SED  01/18/2019  . IR ANGIO INTRA EXTRACRAN SEL INTERNAL CAROTID UNI L MOD SED  03/13/2019  . IR ANGIOGRAM EXTREMITY LEFT  01/18/2019  . IR ANGIOGRAM FOLLOW UP STUDY  03/13/2019  . IR  TRANSCATH/EMBOLIZ  03/13/2019  . LAPAROTOMY  09/02/2012   Procedure: EXPLORATORY LAPAROTOMY;  Surgeon: Serafina Mitchell, MD;  Location: [facility] OR;  Service: Vascular;  Laterality: N/A;  Exploratory Laparotomy with Sigmoid resection with end colocstomy.  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 TONSILLECTOMY          Home Medications    Prior to Admission medications   Medication Sig Start Date End Date Taking? Authorizing Provider  albuterol (VENTOLIN HFA) 108 (90 Base) MCG/ACT inhaler Inhale 2-4 puffs into the lungs every 6 (six) hours as needed for shortness of breath or wheezing. 12/11/18   [provider]  aspirin EC 325 MG tablet Take 325 mg by mouth daily.     [provider]  clopidogrel (PLAVIX) 75 MG tablet Take 75 mg by mouth daily.    [provider]  digoxin (LANOXIN) 0.25 MG tablet TAKE 1 TABLET (250 MCG TOTAL) BY MOUTH DAILY. Patient taking differently: Take 0.25 mg by mouth daily.  02/08/19   Park Liter, MD  metoprolol tartrate (LOPRESSOR) 50 MG tablet TAKE 1 TABLET BY MOUTH EVERY DAY 06/05/19   Park Liter, MD  nitroGLYCERIN (NITROSTAT) 0.4 MG SL tablet Place 1 tablet (0.4 mg total) under the tongue every 5 (five) minutes as needed for chest pain. 01/15/19   Park Liter, MD  sertraline (ZOLOFT) 50 MG tablet Take 50 mg by mouth daily.    [provider]  XARELTO 20 MG TABS tablet Take 20 mg by mouth daily. 03/05/19   [provider]    Family History Family History  Problem Relation Age of Onset  . Lymphoma Father   . Prostate cancer Paternal Grandfather   . Colon cancer Neg Hx     Social History Social History   Tobacco Use  . Smoking status: Current Some Day Smoker    Packs/day: 0.10    Years: 45.00    Pack years: 4.50    Types: Cigarettes  . Smokeless tobacco: Never Used   Substance Use Topics  . Alcohol use: No  . Drug use: No     Allergies   Beef-derived products, Codeine, and Poison ivy extract [poison ivy extract]   Review of Systems Review of Systems  Constitutional: Negative for fever.  Respiratory: Positive for shortness of breath. Negative for cough and wheezing.   Cardiovascular: Negative for chest pain.  Gastrointestinal: Positive for blood in stool. Negative for abdominal pain, nausea and vomiting.  Neurological: Positive for light-headedness. Negative for syncope.  Hematological: Bruises/bleeds easily.  All other systems reviewed and are negative.    Physical Exam Updated Vital Signs BP 114/80   Pulse 88   Resp 17   Ht 6' (1.829 m)   Wt 72.1 kg   SpO2 (!) 85%   BMI 21.56 kg/m  Physical Exam Vitals signs and nursing note reviewed.  Constitutional:      General: He is not in acute distress.    Appearance: Normal appearance. He is well-developed. He is not ill-appearing.  HENT:     Head: Normocephalic and atraumatic.  Eyes:     General: No scleral icterus.       Right eye: No discharge.        Left eye: No discharge.     Conjunctiva/sclera: Conjunctivae normal.     Pupils: Pupils are equal, round, and reactive to light.  Neck:     Musculoskeletal: Normal range of motion.  Cardiovascular:     Rate and Rhythm: Normal rate. Rhythm irregularly irregular.  Pulmonary:     Effort: Pulmonary effort is normal. No respiratory distress.     Breath sounds: Wheezing (wheezing that clears with cough) present.  Abdominal:     General: There is no distension.     Palpations: Abdomen is soft.     Tenderness: There is no abdominal tenderness.  Genitourinary:    Comments: Rectal: Melena present Skin:    General: Skin is warm and dry.  Neurological:     Mental Status: He is alert and oriented to person, place, and time.  Psychiatric:        Mood and Affect: Mood normal.        Behavior: Behavior normal.      ED Treatments /  Results  Labs (all labs ordered are listed, but only abnormal results are displayed) Labs Reviewed  COMPREHENSIVE METABOLIC PANEL - Abnormal; Notable for the following components:      Result Value   Glucose, Bld 124 (*)    BUN 42 (*)    All other components within normal limits  CBC WITH DIFFERENTIAL/PLATELET - Abnormal; Notable for the following components:   RBC 3.91 (*)    MCV 100.3 (*)    All other components within normal limits  PROTIME-INR - Abnormal; Notable for the following components:   Prothrombin Time 23.6 (*)    INR 2.1 (*)    All other components within normal limits  OCCULT BLOOD X 1 CARD TO LAB, STOOL - Abnormal; Notable for the following components:   Fecal Occult Bld POSITIVE (*)    All other components within normal limits  SARS CORONAVIRUS 2 (HOSPITAL ORDER, [place] [facility])    EKG None  Radiology No results found.  Procedures Procedures (including critical care time)  Medications Ordered in ED Medications  pantoprazole (PROTONIX) 80 mg in sodium chloride 0.9 % 250 mL (0.32 mg/mL) infusion (8 mg/hr Intravenous New Bag/Given 06/08/19 1707)  pantoprazole (PROTONIX) injection 40 mg (has no administration in time range)  pantoprazole (PROTONIX) 40 MG injection (has no administration in time range)  0.9 %  sodium chloride infusion (500 mLs Intravenous New Bag/Given 06/08/19 1706)  sodium chloride 0.9 % bolus 1,000 mL ( Intravenous Stopped 06/08/19 1540)     Initial Impression / Assessment and Plan / ED Course  I have reviewed the triage vital signs and the nursing notes.  Pertinent labs & imaging results that were available during my care of the patient were reviewed by me and considered in my medical decision making (see chart for details).  68 year old male presents with lightheadedness and dark stools for several days.  He is on multiple antiplatelet agents and Xarelto.  Blood pressure is soft here.  Otherwise vital signs are  normal. Abdomen is soft and non-tender.  He has  melena on exam.  He was given a fluid bolus for soft blood pressures. Will obtain labs, type and screen  CBC is remarkable for hemoglobin of 13 which is dropped from 17 in July.  CMP is remarkable for elevated BUN.  With melena this likely represents upper GI bleed.  Protonix started. Fecal occult is positive.  Discussed with Amy Vandervoort with low Exie Parody GI who recommends having patient come to [facility] for possible EGD tomorrow.  Discussed with Dr. [place] who will admit to [facility].  Final Clinical Impressions(s) / ED Diagnoses   Final diagnoses:  Upper GI bleed  Melena  Orthostatic lightheadedness    ED Discharge Orders    None       Recardo Evangelist, PA-C 06/08/19 1807    Tegeler, Gwenyth Allegra, MD 06/09/19 0006

## 2019-06-08 NOTE — Care Management Note (Signed)
Johnathan Bauer is a 68 year old male with past medical history significant for CAD, atrial fibrillation, brain aneurysm status post recent pipeline embolization of LICA who presented to [facility] with dizziness, hypotension and dark stools.  Complicated by current use of Xarelto, Plavix and aspirin.  He was found to have a hemoglobin of 13.0, down from 17.1 March 08, 2019.  He was FOBT positive. PA Claiborne Billings from [facility] called for transfer.  Current vital signs HR 88, RR 17, BP 114/80, 100% on room air.  EDP notified [facility] - Amy Bokeelia and Dr. Carlean Purl; who requested admission to [facility] rather than [facility] since [facility] will be at a service tomorrow.  Accepted to medical telemetry bed at [facility].  Please call manager of [facility] hospitalists at [phone] when pt arrives to floor

## 2019-06-08 NOTE — ED Triage Notes (Signed)
Pt reports dizziness, SOB, and dark stools x 4 days.

## 2019-06-08 NOTE — ED Notes (Signed)
Wells Guiles [phone]) Pt wife

## 2019-06-08 NOTE — H&P (Signed)
Johnathan Bauer [id number] DOB: 1951/03/05 DOA: 06/08/2019     PCP: Johnathan Bauer., MD   Outpatient Specialists:   CARDS:  Dr. Agustin Bauer   NeuroSurgery  Dr. Kathyrn Bauer   GI Dr. Ardis Bauer (  [facility])    Patient arrived to ER on 06/08/19 at 1406  Patient coming from: home Lives  With family    Chief Complaint:   Chief Complaint  Patient presents with   Dizziness    HPI: Johnathan Bauer is a 68 y.o. male with medical history significant of CAD, atrial fibrillation, brain aneurysm status post recent pipeline embolization of LICA, chronic anticoagulation for atrial fibrillation CKD    Presented with   dizziness and hypotension Has been feeling lightheaded for the past 3 to 4 days he went out to eat and just felt like the world was spinning around him. Worse when he tries to stand up.  He feels like he needs to sit down blood pressure has been low in 80s and 90s.  He has been trying to drink some Glucerna He has had some dark stool close to black the past few days Loose has been somewhat more short of breath when he takes out the garbage he walks up the stairs  Last hemoglobin per records was 17.1 in July He was seen by cardiology who was concerned the patient was having a GI bleeding as he has been on Plavix aspirin 325 and Xarelto 20 mg daily with no PPI on board Patient was supposed to stop his Plavix on Tuesday as it has been 3 months after his stent for aneurysm was placed Check his orthostatics his blood pressure was 108/60 sitting and 90/58 standing He was instructed to go to emergency department He was admitted 03/13/19 for aneurysm stenting by neurosurgery.  Reports in the past he had seen Dr. Ardis Bauer and had a colonoscopy and endoscopy done Denies taking ibuprofen or Aleve No recent meds changes  He has been on Keppra for about a month after the procedure but has been discontinued  Infectious risk factors:  Reports  shortness of breath  In  ER RAPID COVID TEST  NEGATIVE   Lab Results  Component Value Date   [place] NEGATIVE 06/08/2019   [place] NEGATIVE 03/09/2019   [place] NEGATIVE 01/17/2019    Regarding pertinent Chronic problems:       HTN on  toprolol     A. Fib -   CHA2DS2 vas score 4 :  current  on anticoagulation with  Xarelto ,           -  Rate control:  Currently controlled with  Toprolol             While in ER: Noted to be Hemoccult positive with melanotic stool on exam  The following Work up has been ordered so far:  Orders Placed This Encounter  Procedures   SARS Coronavirus 2 [facility] order, Performed in [place] hospital lab) Nasopharyngeal Nasopharyngeal Swab   Comprehensive metabolic panel   CBC WITH DIFFERENTIAL   Protime-INR   Occult blood card to lab, stool Provider will collect   Diet NPO time specified   Place X2 Large Bore IV's   Initiate Carrier Fluid Protocol   Cardiac monitoring   Consult to gastroenterology  ALL PATIENTS BEING ADMITTED/HAVING PROCEDURES NEED COVID-19 SCREENING   Pulse oximetry, continuous   Admit to Inpatient (patient's expected length of stay will be greater than 2 midnights or inpatient only procedure)  Following Medications were ordered in ER: Medications  pantoprazole (PROTONIX) 80 mg in sodium chloride 0.9 % 250 mL (0.32 mg/mL) infusion (8 mg/hr Intravenous Handoff 06/08/19 2058)  pantoprazole (PROTONIX) injection 40 mg (has no administration in time range)  pantoprazole (PROTONIX) 40 MG injection (has no administration in time range)  0.9 %  sodium chloride infusion (500 mLs Intravenous New Bag/Given 06/08/19 1706)  sodium chloride 0.9 % bolus 1,000 mL ( Intravenous Stopped 06/08/19 1540)    ER Provider Called:   Dr.  Carlean Purl with [facility] They Recommend admit to medicine   Will see in AM    Significant initial  Findings: Abnormal Labs Reviewed  COMPREHENSIVE METABOLIC PANEL - Abnormal; Notable for the following components:      Result Value     Glucose, Bld 124 (*)    BUN 42 (*)    All other components within normal limits  CBC WITH DIFFERENTIAL/PLATELET - Abnormal; Notable for the following components:   RBC 3.91 (*)    MCV 100.3 (*)    All other components within normal limits  PROTIME-INR - Abnormal; Notable for the following components:   Prothrombin Time 23.6 (*)    INR 2.1 (*)    All other components within normal limits  OCCULT BLOOD X 1 CARD TO LAB, STOOL - Abnormal; Notable for the following components:   Fecal Occult Bld POSITIVE (*)    All other components within normal limits    Otherwise labs showing:    Recent Labs  Lab 06/08/19 1427  NA 135  K 4.3  CO2 24  GLUCOSE 124*  BUN 42*  CREATININE 1.13  CALCIUM 9.6    Cr    stable,    Lab Results  Component Value Date   CREATININE 1.13 06/08/2019   CREATININE 1.13 03/08/2019   CREATININE 1.01 01/10/2019    Recent Labs  Lab 06/08/19 1427  AST 23  ALT 15  ALKPHOS 65  BILITOT 0.5  PROT 7.0  ALBUMIN 3.9   Lab Results  Component Value Date   CALCIUM 9.6 06/08/2019   PHOS 3.2 09/15/2012     WBC      Component Value Date/Time   WBC 9.0 06/08/2019 1427   ANC    Component Value Date/Time   NEUTROABS 6.0 06/08/2019 1427     Plt: Lab Results  Component Value Date   PLT 244 06/08/2019     Lactic Acid, Venous    Component Value Date/Time   LATICACIDVEN 1.5 09/06/2012 1748      COVID-19 Labs  No results for input(s): DDIMER, FERRITIN, LDH, CRP in the last 72 hours.  Lab Results  Component Value Date   SARSCOV2NAA NEGATIVE 06/08/2019   SARSCOV2NAA NEGATIVE 03/09/2019   [place] NEGATIVE 01/17/2019      HG/HCT   Down      Component Value Date/Time   HGB 13.0 06/08/2019 1427   HGB 16.4 07/08/2017 1212   HCT 39.2 06/08/2019 1427   HCT 48.6 07/08/2017 1212        ECG: Ordered Personally reviewed by me showing: HR : 86 Rhythm:   A.fib.   no evidence of ischemic changes QTC 486       UA not ordered   Urine  analysis:    Component Value Date/Time   COLORURINE YELLOW 03/08/2019 [place] 03/08/2019 1139   LABSPEC 1.016 03/08/2019 1139   PHURINE 6.0 03/08/2019 1139   GLUCOSEU NEGATIVE 03/08/2019 1139   [place] 03/08/2019 1139  BILIRUBINUR NEGATIVE 03/08/2019 1139   KETONESUR NEGATIVE 03/08/2019 1139   PROTEINUR NEGATIVE 03/08/2019 1139   UROBILINOGEN 0.2 09/13/2012 0852   NITRITE NEGATIVE 03/08/2019 [place] 03/08/2019 1139        ED Triage Vitals  Enc Vitals Group     BP 06/08/19 1420 97/82     Pulse Rate 06/08/19 1420 80     Resp 06/08/19 1420 (!) 21     Temp 06/08/19 2000 98.2 F (36.8 C)     Temp Source 06/08/19 2000 Oral     SpO2 06/08/19 1420 100 %     Weight 06/08/19 1420 159 lb (72.1 kg)     Height 06/08/19 1420 6' (1.829 m)     Head Circumference --      Peak Flow --      Pain Score 06/08/19 1420 0     Pain Loc --      Pain Edu? --      Excl. in [place]? --   TMAX(24)@       Latest  Blood pressure 109/77, pulse 81, temperature 98.2 F (36.8 C), temperature source Oral, resp. rate 16, height 6' (1.829 m), weight 73.2 kg, SpO2 100 %.    Hospitalist was called for admission for upper Gi bleed in the setting of Aspirin 325, plavix and Xarelto   Review of Systems:    Pertinent positives include: lightheadedness   Constitutional:  No weight loss, night sweats, Fevers, chills, fatigue, weight loss  HEENT:  No headaches, Difficulty swallowing,Tooth/dental problems,Sore throat,  No sneezing, itching, ear ache, nasal congestion, post nasal drip,  Cardio-vascular:  No chest pain, Orthopnea, PND, anasarca, dizziness, palpitations.no Bilateral lower extremity swelling  GI:  No heartburn, indigestion, abdominal pain, nausea, vomiting, diarrhea, change in bowel habits, loss of appetite, melena, blood in stool, hematemesis Resp:  no shortness of breath at rest. No dyspnea on exertion, No excess mucus, no productive cough, No  non-productive cough, No coughing up of blood.No change in color of mucus.No wheezing. Skin:  no rash or lesions. No jaundice GU:  no dysuria, change in color of urine, no urgency or frequency. No straining to urinate.  No flank pain.  Musculoskeletal:  No joint pain or no joint swelling. No decreased range of motion. No back pain.  Psych:  No change in mood or affect. No depression or anxiety. No memory loss.  Neuro: no localizing neurological complaints, no tingling, no weakness, no double vision, no gait abnormality, no slurred speech, no confusion  All systems reviewed and apart from [place] all are negative  Past Medical History:   Past Medical History:  Diagnosis Date   Arthritis    "touch in my right thumb" (12/31/2014)   Chronic atrial fibrillation (HCC)    did not like coumadin   Chronic kidney disease 09/2012   Coronary artery disease    s/p PCI Sept 2013 with DES OM   Diverticulitis with perforation    Dysrhythmia    A-fib   Pneumonia 09/2012   Stroke ([employer]) 04/2016      Past Surgical History:  Procedure Laterality Date   COLON SURGERY     COLOSTOMY REVERSAL  06/06/2013   Dr Rosendo Gros   COLOSTOMY REVISION  09/02/2012   Procedure: COLON RESECTION SIGMOID;  Surgeon: Serafina Mitchell, MD;  Location: [place];  Service: Vascular;  Laterality: N/A;  Sigmoid resection with End Colostomy.   COLOSTOMY TAKEDOWN N/A 06/06/2013   Procedure: LAPAROSCOPIC OSTOMY TAKEDOWN AND ANASTOMOSIS ;  Surgeon: Ralene Ok, MD;  Location: [place];  Service: General;  Laterality: N/A;   CORONARY ANGIOPLASTY WITH STENT PLACEMENT  06/2012   "1"   [place]  12/31/2014   w/mesh   INCISIONAL HERNIA REPAIR N/A 12/31/2014   Procedure: OPEN INCISIONAL HERNIA REPAIR WITH MESH (TAR PROCEDURE);  Surgeon: Ralene Ok, MD;  Location: [place];  Service: General;  Laterality: N/A;   INSERTION OF MESH N/A 12/31/2014   Procedure: INSERTION OF MESH;  Surgeon:  Ralene Ok, MD;  Location: [place];  Service: General;  Laterality: N/A;   IR 3D INDEPENDENT WKST  01/18/2019   IR ANGIO INTRA EXTRACRAN SEL INTERNAL CAROTID BILAT MOD SED  01/18/2019   IR ANGIO INTRA EXTRACRAN SEL INTERNAL CAROTID UNI L MOD SED  03/13/2019   IR ANGIOGRAM EXTREMITY LEFT  01/18/2019   IR ANGIOGRAM FOLLOW UP STUDY  03/13/2019   IR TRANSCATH/EMBOLIZ  03/13/2019   LAPAROTOMY  09/02/2012   Procedure: EXPLORATORY LAPAROTOMY;  Surgeon: Serafina Mitchell, MD;  Location[facility];  Service: Vascular;  Laterality: N/A;  Exploratory Laparotomy with Sigmoid resection with end colocstomy.   RADIOLOGY WITH ANESTHESIA N/A 03/13/2019   Procedure: [employer];  Surgeon: Consuella Lose, MD;  Location: [place];  Service: Radiology;  Laterality: N/A;   TONSILLECTOMY      Social History:  Ambulatory   Independently      reports that he has been smoking cigarettes. He has a 4.50 pack-year smoking history. He has never used smokeless tobacco. He reports that he does not drink alcohol or use drugs.   Family History:   Family History  Problem Relation Age of Onset   Lymphoma Father    Prostate cancer Paternal Grandfather    Colon cancer Neg Hx     Allergies: Allergies  Allergen Reactions   Beef-Derived Products Hives    Has to watch how much beef he eats   Codeine Nausea And Vomiting   Poison Ivy Extract [Poison Ivy Extract] Hives     Prior to Admission medications   Medication Sig Start Date End Date Taking? Authorizing Provider  albuterol (VENTOLIN HFA) 108 (90 Base) MCG/ACT inhaler Inhale 2-4 puffs into the lungs every 6 (six) hours as needed for shortness of breath or wheezing. 12/11/18   [provider]  aspirin EC 325 MG tablet Take 325 mg by mouth daily.     [provider]  clopidogrel (PLAVIX) 75 MG tablet Take 75 mg by mouth daily.    [provider]  digoxin (LANOXIN) 0.25 MG tablet TAKE 1 TABLET (250 MCG TOTAL) BY MOUTH  DAILY. Patient taking differently: Take 0.25 mg by mouth daily.  02/08/19   Park Liter, MD  metoprolol tartrate (LOPRESSOR) 50 MG tablet TAKE 1 TABLET BY MOUTH EVERY DAY 06/05/19   Park Liter, MD  nitroGLYCERIN (NITROSTAT) 0.4 MG SL tablet Place 1 tablet (0.4 mg total) under the tongue every 5 (five) minutes as needed for chest pain. 01/15/19   Park Liter, MD  sertraline (ZOLOFT) 50 MG tablet Take 50 mg by mouth daily.    [provider]  XARELTO 20 MG TABS tablet Take 20 mg by mouth daily. 03/05/19   [provider]   Physical Exam: Blood pressure 109/77, pulse 81, temperature 98.2 F (36.8 C), temperature source Oral, resp. rate 16, height 6' (1.829 m), weight 73.2 kg, SpO2 100 %. 1. General:  in No Acute distress   well  -  appearing 2. Psychological: Alert and   Oriented 3. Head/ENT:    Dry Mucous Membranes                          Head Non traumatic, neck supple                           Poor Dentition 4. SKIN:   decreased Skin turgor,  Skin clean Dry and intact no rash 5. Heart: Regular rate and rhythm no Murmur, no Rub or gallop 6. Lungs: Clear to auscultation bilaterally, no wheezes or crackles   7. Abdomen: Soft,  non-tender, Non distended  bowel sounds present 8. Lower extremities: no clubbing, cyanosis, no  edema 9. Neurologically  strength 5 out of 5 in all 4 extremities cranial nerves II through XII intact 10. MSK: Normal range of motion   All other LABS:     Recent Labs  Lab 06/08/19 1427  WBC 9.0  NEUTROABS 6.0  HGB 13.0  HCT 39.2  MCV 100.3*  PLT 244     Recent Labs  Lab 06/08/19 1427  NA 135  K 4.3  CL 100  CO2 24  GLUCOSE 124*  BUN 42*  CREATININE 1.13  CALCIUM 9.6     Recent Labs  Lab 06/08/19 1427  AST 23  ALT 15  ALKPHOS 65  BILITOT 0.5  PROT 7.0  ALBUMIN 3.9       Cultures:    Component Value Date/Time   SDES ENDOTRACHEAL 09/07/2012 1721   SPECREQUEST NONE 09/07/2012 1721   CULT FEW  CANDIDA ALBICANS 09/07/2012 1721   REPTSTATUS 09/10/2012 FINAL 09/07/2012 1721     Radiological Exams on Admission: No results found.  Chart has been reviewed    Assessment/Plan  68 y.o. male with medical history significant of CAD, atrial fibrillation, brain aneurysm status post recent pipeline embolization of LICA, chronic anticoagulation for atrial fibrillation CKD Admitted for GI bleed  Present on Admission:  UGIB (upper gastrointestinal bleed) -  - [place] score BUN >18.2  ,     melena     >1 Justifies admission and aggressive management      Modifying risk factors include:     NSAIDS use  anticoagulation,         -    AIMS 65 =    INR >1.5  TOTAL of 1               -  ER  Provider spoke to gastroenterology ( [facility]) they will see patient in a.m. appreciate their consult   - serial CBC.    - Monitor for any recurrence,  evidence of hemodynamic instability or significant blood loss  - Transfuse as needed for hemoglobin below 7 or evidence of life-threatening bleeding  - Establish at least 2 PIV and fluid resuscitate   - clear liquids for tonight keep nothing by mouth post midnight,   -  administer Protonix  Drip    Cerebral aneurysm, nonruptured -status post recent stenting less than 3 months ago.  Discussed with neurosurgery at night at this point they requested for me to order aspirin and Plavix in epic they will discuss with operating neurosurgeon in a.m. and if okay to discontinue would let us know Patient already received his dose in a.m. on 2 October. Given recent stenting patient is at high risk of in-stent thrombosis   Chronic atrial fibrillation ([place]): Anticoagulated his  chads 2 Vas score equals 3 -hold Eliquis for now currently appear to be rate controlled checked digoxin level nontoxic    Arteriosclerotic cardiovascular disease (ASCVD) -chronic currently stable   Essential hypertension allow some permissive hypertension for tonight monitor  carefully  Hx of recent aneurism stent - on aspirin and Plavix and Xarelto, will DC Xarelto At this point neurosurgery will discuss in a.m. if safe to discontinue aspirin and Plavix  Other plan as per orders.  DVT prophylaxis:  SCD       Code Status:  FULL CODE   as per patient   I had personally discussed CODE STATUS with patient   Family Communication:   Family not at  Bedside    Disposition Plan:     likely will need placement for rehabilitation                                              Would benefit from PT/OT eval prior to DC  Ordered                   Consults called:  [facility], Neurosurgery Dr. Arneta Cliche please re-page in AM [phone]  Admission status:  ED Disposition    ED Disposition Condition [place]: [place] [[id number]]  Level of Care: Telemetry Medical [104]  Covid Evaluation: Asymptomatic Screening Protocol (No Symptoms)  Diagnosis: UGIB (upper gastrointestinal bleed) [id number]  Admitting Physician: [place] ERIC J [id number]  Attending Physician: [place] ERIC J [id number]  Estimated length of stay: past midnight tomorrow  Certification:: I certify this patient will need inpatient services for at least 2 midnights  PT Class (Do Not Modify): Inpatient [101]  PT Acc Code (Do Not Modify): Private [1]       Obs      Level of care   tele  For   24H   Precautions:  NONE  No active isolations  PPE: Used by the provider:   P100  eye Goggles,  Gloves     Tripton Ned 06/08/2019, 2:38 AM    [facility]     after 2 AM please page floor coverage PA If 7AM-7PM, please contact the day team taking care of the patient using [email or web address]

## 2019-06-09 ENCOUNTER — Encounter (HOSPITAL_COMMUNITY): Payer: Self-pay | Admitting: *Deleted

## 2019-06-09 ENCOUNTER — Encounter (HOSPITAL_COMMUNITY): Payer: Self-pay | Admitting: Anesthesiology

## 2019-06-09 ENCOUNTER — Encounter (HOSPITAL_COMMUNITY)
Admission: EM | Disposition: A | Discharge status: Home / Self Care | Payer: Self-pay | Source: Home / Self Care | Attending: Internal Medicine

## 2019-06-09 DIAGNOSIS — K2961 Other gastritis with bleeding: Secondary | ICD-10-CM

## 2019-06-09 DIAGNOSIS — K921 Melena: Secondary | ICD-10-CM

## 2019-06-09 DIAGNOSIS — D649 Anemia, unspecified: Secondary | ICD-10-CM

## 2019-06-09 DIAGNOSIS — D62 Acute posthemorrhagic anemia: Secondary | ICD-10-CM

## 2019-06-09 HISTORY — PX: BIOPSY: SHX5522

## 2019-06-09 HISTORY — PX: ESOPHAGOGASTRODUODENOSCOPY (EGD) WITH PROPOFOL: SHX5813

## 2019-06-09 LAB — CBC
HCT: 28.6 % — ABNORMAL LOW (ref 39.0–52.0)
HCT: 26.7 % — ABNORMAL LOW (ref 39.0–52.0)
HCT: 25.9 % — ABNORMAL LOW (ref 39.0–52.0)
Hemoglobin: 9.8 g/dL — ABNORMAL LOW (ref 13.0–17.0)
Hemoglobin: 9.1 g/dL — ABNORMAL LOW (ref 13.0–17.0)
Hemoglobin: 8.8 g/dL — ABNORMAL LOW (ref 13.0–17.0)
MCH: 34.5 pg — ABNORMAL HIGH (ref 26.0–34.0)
MCH: 34.3 pg — ABNORMAL HIGH (ref 26.0–34.0)
MCH: 34.4 pg — ABNORMAL HIGH (ref 26.0–34.0)
MCHC: 34.3 g/dL (ref 30.0–36.0)
MCHC: 34.1 g/dL (ref 30.0–36.0)
MCHC: 34 g/dL (ref 30.0–36.0)
MCV: 100.7 fL — ABNORMAL HIGH (ref 80.0–100.0)
MCV: 100.8 fL — ABNORMAL HIGH (ref 80.0–100.0)
MCV: 101.2 fL — ABNORMAL HIGH (ref 80.0–100.0)
Platelets: 164 10*3/uL (ref 150–400)
Platelets: 154 10*3/uL (ref 150–400)
Platelets: 156 10*3/uL (ref 150–400)
RBC: 2.84 MIL/uL — ABNORMAL LOW (ref 4.22–5.81)
RBC: 2.65 MIL/uL — ABNORMAL LOW (ref 4.22–5.81)
RBC: 2.56 MIL/uL — ABNORMAL LOW (ref 4.22–5.81)
RDW: 14.6 % (ref 11.5–15.5)
RDW: 14.8 % (ref 11.5–15.5)
RDW: 14.9 % (ref 11.5–15.5)
WBC: 6.4 10*3/uL (ref 4.0–10.5)
WBC: 5.1 10*3/uL (ref 4.0–10.5)
WBC: 4.5 10*3/uL (ref 4.0–10.5)
nRBC: 0 % (ref 0.0–0.2)
nRBC: 0 % (ref 0.0–0.2)
nRBC: 0 % (ref 0.0–0.2)

## 2019-06-09 LAB — TYPE AND SCREEN
ABO/RH(D): O NEG
Antibody Screen: NEGATIVE

## 2019-06-09 LAB — COMPREHENSIVE METABOLIC PANEL
ALT: 14 U/L (ref 0–44)
AST: 17 U/L (ref 15–41)
Albumin: 2.7 g/dL — ABNORMAL LOW (ref 3.5–5.0)
Alkaline Phosphatase: 46 U/L (ref 38–126)
Anion gap: 8 (ref 5–15)
BUN: 34 mg/dL — ABNORMAL HIGH (ref 8–23)
CO2: 24 mmol/L (ref 22–32)
Calcium: 8.4 mg/dL — ABNORMAL LOW (ref 8.9–10.3)
Chloride: 106 mmol/L (ref 98–111)
Creatinine, Ser: 1.13 mg/dL (ref 0.61–1.24)
GFR calc Af Amer: >60 mL/min (ref >60)
GFR calc non Af Amer: >60 mL/min (ref >60)
Glucose, Bld: 112 mg/dL — ABNORMAL HIGH (ref 70–99)
Potassium: 4.8 mmol/L (ref 3.5–5.1)
Sodium: 138 mmol/L (ref 135–145)
Total Bilirubin: 0.5 mg/dL (ref 0.3–1.2)
Total Protein: 4.7 g/dL — ABNORMAL LOW (ref 6.5–8.1)

## 2019-06-09 LAB — TSH: TSH: 3.911 u[IU]/mL (ref 0.350–4.500)

## 2019-06-09 LAB — DIGOXIN LEVEL: Digoxin Level: 1.2 ng/mL (ref 0.8–2.0)

## 2019-06-09 LAB — MAGNESIUM: Magnesium: 1.5 mg/dL — ABNORMAL LOW (ref 1.7–2.4)

## 2019-06-09 LAB — PHOSPHORUS: Phosphorus: 3.4 mg/dL (ref 2.5–4.6)

## 2019-06-09 LAB — HIV ANTIBODY (ROUTINE TESTING W REFLEX): HIV Screen 4th Generation wRfx: NONREACTIVE

## 2019-06-09 SURGERY — ESOPHAGOGASTRODUODENOSCOPY (EGD) WITH PROPOFOL
Anesthesia: Monitor Anesthesia Care

## 2019-06-09 MED ORDER — FENTANYL CITRATE (PF) 100 MCG/2ML IJ SOLN
INTRAMUSCULAR | Status: AC
  Filled 2019-06-09: qty 2

## 2019-06-09 MED ORDER — FENTANYL CITRATE (PF) 100 MCG/2ML IJ SOLN
INTRAMUSCULAR | Status: DC | PRN
  Administered 2019-06-09: 25 ug via INTRAVENOUS

## 2019-06-09 MED ORDER — LACTATED RINGERS IV SOLN
INTRAVENOUS | Status: DC
  Administered 2019-06-09: 14:00:00 via INTRAVENOUS

## 2019-06-09 MED ORDER — MIDAZOLAM HCL (PF) 10 MG/2ML IJ SOLN
INTRAMUSCULAR | Status: DC | PRN
  Administered 2019-06-09 (×2): 2 mg via INTRAVENOUS

## 2019-06-09 MED ORDER — MAGNESIUM SULFATE 2 GM/50ML IV SOLN
2.0000 g | Freq: Once | INTRAVENOUS | Status: AC
  Administered 2019-06-09: 2 g via INTRAVENOUS
  Filled 2019-06-09: qty 50

## 2019-06-09 MED ORDER — SODIUM CHLORIDE 0.9 % IV SOLN: INTRAVENOUS | Status: DC

## 2019-06-09 MED ORDER — MIDAZOLAM HCL (PF) 5 MG/ML IJ SOLN
INTRAMUSCULAR | Status: AC
  Filled 2019-06-09: qty 2

## 2019-06-09 SURGICAL SUPPLY — 15 items

## 2019-06-09 NOTE — Anesthesia Preprocedure Evaluation (Deleted)
Anesthesia Evaluation  Patient identified by MRN, date of birth, ID band Patient awake    Reviewed: Allergy & Precautions, NPO status , Patient's Chart, lab work & pertinent test results  Airway Mallampati: II  TM Distance: >3 FB Neck ROM: Full    Dental  (+) Missing, Chipped,    Pulmonary Current Smoker, former smoker,    Pulmonary exam normal breath sounds clear to auscultation       Cardiovascular hypertension, Pt. 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  Reproductive/Obstetrics                             Anesthesia Physical  Anesthesia Plan  ASA: III  Anesthesia Plan: MAC   Post-op Pain Management:    Induction:   PONV Risk Score and Plan: 2 and Ondansetron, Treatment may vary due to age or medical condition and Propofol infusion  Airway Management Planned: Natural Airway and Nasal Cannula  Additional Equipment:   Intra-op Plan:   Post-operative Plan:   Informed Consent: I have reviewed the patients History and Physical, chart, labs and discussed the procedure including the risks, benefits and alternatives for the proposed anesthesia with the patient or authorized representative who has indicated his/her understanding and acceptance.     Dental advisory given  Plan Discussed with: CRNA  Anesthesia  Plan Comments: (Follows with Dr. Agustin Cree for Afib and CAD s/p DES to OM 2013. Last seen 06/23/18. Echo ordered at that time for monitoring of LV function.  TTE 08/15/18:  - Left ventricle: The cavity size was normal. Systolic function was   normal. The estimated ejection fraction was in the range of 55%   to 60%. Wall motion was normal; there were no regional wall   motion abnormalities. - Mitral valve: There was mild regurgitation. - Left atrium: The atrium was severely dilated. - Right atrium: The atrium was moderately dilated. - Tricuspid valve: There was moderate regurgitation.  Impressions:  - Normal LVEF.   Severe LAE.   Moderate RAE.   Moderate TR. )        Anesthesia Quick Evaluation

## 2019-06-09 NOTE — Progress Notes (Signed)
I have spoken with Dr. Kathyrn Sheriff. It is ok to stop the Plavix, and have him remain on the aspirin.

## 2019-06-09 NOTE — Evaluation (Signed)
Physical Therapy Evaluation Patient Details Name: Johnathan Bauer MRN: [id number] DOB: 08-09-1951 Today's Date: 06/09/2019   History of Present Illness  68yo male presenting to [place] with dizziness, hypotension, dark stools, Hgb 13 and FOBT positive in ED. Transferred to [facility] for likely upper GI bleed and acute blood loss anemia. 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 Able to complete bed mobility and functional transfers with Mod(I) however session severely limited by orthostatic BP/HR response and medical status as below:  Supine 105/76 Sitting 88/57 Sitting x3 minutes 93/61 Standing 72/50, HR in A-fib 120-137BPM- did not assess standing for 3 minutes due to elevated HR with static standing Return to supine 92/66 HR in A-fib 85-96BPM   PT session/eval very limited today. Will follow and make further therapy recommendations as able and appropriate when patient is able to progress activity.     Follow Up Recommendations Other (comment)(TBD)    Equipment Recommendations  (TBD)    Recommendations for Other Services       Precautions / Restrictions Precautions Precautions: Other (comment);Fall Precaution Comments: watch orthostatics and HR Restrictions Weight Bearing Restrictions: No      Mobility  Bed Mobility Overal bed mobility: Modified Independent                Transfers Overall transfer level: Modified independent Equipment used: None             General transfer comment: HR to 137BPM in A-fib with standing, unable to assess BP standing x3 minutes or gait; tends to use uncontrolled drop down to bed to sit down  Ambulation/Gait             General Gait Details: DNT due to unsafe vitals parameters  Stairs            Wheelchair Mobility    Modified Rankin  (Stroke Patients Only)       Balance Overall balance assessment: Mild deficits observed, not formally tested                                           Pertinent Vitals/Pain Pain Assessment: No/denies pain    Home Living Family/patient expects to be discharged to:: Private residence Living Arrangements: Spouse/significant other Available Help at Discharge: Available 24 hours/day Type of Home: House Home Access: Stairs to enter Entrance Stairs-Rails: None Entrance Stairs-Number of Steps: 1 Home Layout: Multi-level Home Equipment: None      Prior Function Level of Independence: Independent               Hand Dominance   Dominant Hand: Right    Extremity/Trunk Assessment   Upper Extremity Assessment Upper Extremity Assessment: Overall WFL for tasks assessed    Lower Extremity Assessment Lower Extremity Assessment: Overall WFL for tasks assessed    Cervical / Trunk Assessment Cervical / Trunk Assessment: Normal  Communication   Communication: No difficulties  Cognition Arousal/Alertness: Awake/alert Behavior During Therapy: WFL for tasks assessed/performed Overall Cognitive Status: Within Functional Limits for tasks assessed                                 General Comments: A&Ox4  General Comments      Exercises     Assessment/Plan    PT Assessment Patient needs continued PT services  PT Problem List Decreased strength;Decreased activity tolerance;Decreased safety awareness;Decreased balance;Decreased knowledge of precautions;Decreased mobility;Cardiopulmonary status limiting activity;Decreased coordination       PT Treatment Interventions DME instruction;Balance training;Gait training;Neuromuscular re-education;Stair training;Functional mobility training;Patient/family education;Therapeutic activities;Therapeutic exercise    PT Goals (Current goals can be found in the Care Plan section)  Acute Rehab PT  Goals Patient Stated Goal: go home, feel better PT Goal Formulation: With patient Time For Goal Achievement: 06/23/19 Potential to Achieve Goals: Good    Frequency Min 3X/week   Barriers to discharge        Co-evaluation               AM-PAC PT "6 Clicks" Mobility  Outcome Measure Help needed turning from your back to your side while in a flat bed without using bedrails?: None Help needed moving from lying on your back to sitting on the side of a flat bed without using bedrails?: None Help needed moving to and from a bed to a chair (including a wheelchair)?: None Help needed standing up from a chair using your arms (e.g., wheelchair or bedside chair)?: None Help needed to walk in hospital room?: A Little Help needed climbing 3-5 steps with a railing? : A Little 6 Click Score: 22    End of Session   Activity Tolerance: Treatment limited secondary to medical complications (Comment) Patient left: in bed;with call bell/phone within reach;with bed alarm set Nurse Communication: Other (comment)(orthostatics and HR response limiting activity today) PT Visit Diagnosis: Unsteadiness on feet (R26.81);Muscle weakness (generalized) (M62.81)    Time: [id number] PT Time Calculation (min) (ACUTE ONLY): 25 min   Charges:   PT Evaluation $PT Eval Moderate Complexity: 1 Mod PT Treatments $Self Care/Home Management: 8-22        Deniece Ree PT, DPT, CBIS  Supplemental Physical Therapist [place]    Pager [phone] Acute Rehab Office [phone]

## 2019-06-09 NOTE — Op Note (Addendum)
[employer] Patient Name: Johnathan Bauer Procedure Date : 06/09/2019 MRN: [id number] Attending MD: [name] , MD Date of Birth: 05-Aug-1951 CSN: [id number] Age: 68 Admit Type: Inpatient Procedure:                Upper GI endoscopy Indications:              [place] Providers:                [name], MD, Burtis Junes, RN, William Dalton, Technician Referring MD:              Medicines:                Midazolam 4 mg IV, Fentanyl 50 micrograms IV,                            Cetacaine spray Complications:            No immediate complications. Estimated Blood Loss:     Estimated blood loss was minimal. Procedure:                Pre-Anesthesia Assessment:                           - Prior to the procedure, a History and Physical                            was performed, and patient medications and                            allergies were reviewed. The patient's tolerance of                            previous anesthesia was also reviewed. The risks                            and benefits of the procedure and the sedation                            options and risks were discussed with the patient.                            All questions were answered, and informed consent                            was obtained. Prior Anticoagulants: The patient                            last took Xarelto (rivaroxaban) 1 day prior to the                            procedure and last took Plavix (clopidogrel) on the  day of the procedure. ASA Grade Assessment: III - A                            patient with severe systemic disease. After                            reviewing the risks and benefits, the patient was                            deemed in satisfactory condition to undergo the                            procedure.                           After obtaining informed consent, the endoscope was   passed under direct vision. Throughout the                            procedure, the patient's blood pressure, pulse, and                            oxygen saturations were monitored continuously. The                            [id number] Olympus gastroscope was                            introduced through the mouth, and advanced to the                            second part of duodenum. The upper GI endoscopy was                            accomplished without difficulty. The patient                            tolerated the procedure well. Scope In: Scope Out: Findings:      Diffuse moderate inflammation with hemorrhage characterized by erosions,       erythema, friability and granularity was found in the entire examined       stomach. Biopsies were taken with a cold forceps for histology.       Verification of patient identification for the specimen was done.       Estimated blood loss was minimal.      Patchy mild inflammation characterized by erosions, erythema, friability       and granularity was found in the duodenal bulb.      The exam was otherwise without abnormality.      The cardia and gastric fundus were normal on retroflexion. Impression:               - Acute gastritis with hemorrhage. Biopsied.                            Diffuse gastritis withhj oozing of fresh blood.  antral bxs taken looking for h pylori                           - Acute duodenitis.                           - The examination was otherwise normal. Recommendation:           - Return patient to hospital ward for ongoing care.                           - Cardiac diet.                           - Continue present medications.                           - Await pathology results.                           - STOP PLAVIX - CONTINUE ASA - NEUROSURGERY HAS OKED                           STAY OFF XARELTO FOR NOW ALSO                           AWAIT BXS? H PYLORI                            STAY ON PPI INFUSION FOR NOW                           WILL RECHECK TOMORROW                           WIFE UPDATED BY PHONE Procedure Code(s):        --- Professional ---                           [phone], Esophagogastroduodenoscopy, flexible,                            transoral; with biopsy, single or multiple Diagnosis Code(s):        --- Professional ---                           K29.01, Acute gastritis with bleeding                           K29.80, Duodenitis without bleeding                           K92.1, Melena (includes Hematochezia) CPT copyright 2019 American Medical Association. All rights reserved. The codes documented in this report are preliminary and upon coder review may  be revised to meet current compliance requirements. [name], MD 06/09/2019 3:37:59 PM This report has been signed electronically. Number of Addenda: 0

## 2019-06-09 NOTE — Progress Notes (Signed)
PROGRESS NOTE  Johnathan Bauer [id number] DOB: July 09, 1951 DOA: 06/08/2019 PCP: Enid Skeens., MD  HPI/Recap of past 24 hours: HPI from Dr [place] Johnathan Bauer is a 68 year old male with past medical history significant for CAD, atrial fibrillation, brain aneurysm status post recent pipeline embolization of LICA who presented to [place] with dizziness, hypotension and dark stools.  Complicated by current use of Xarelto, Plavix and aspirin.  He was found to have a hemoglobin of 13.0, down from 17.1 March 08, 2019.  He was FOBT positive.  Patient transferred to [facility] for further management.  GI consulted.    Today, patient denies any new complaints, continues to have black stools, last was around 4 AM this morning.  Denies any nausea/vomiting, further dizziness, chest pain, shortness of breath.  Plan for EGD this a.m.  Assessment/Plan: Active Problems:   Chronic atrial fibrillation ([place]): Anticoagulated his chads 2 Vas score equals 3   Arteriosclerotic cardiovascular disease (ASCVD)   Chronic anticoagulation   Essential hypertension   Cerebral aneurysm, nonruptured   UGIB (upper gastrointestinal bleed)  Likely upper GI bleeding Likely in the setting of taking Xarelto, aspirin, Plavix Hemoglobin downtrending, baseline around 16 Type and screen done GI on board, plan for endoscopy on 06/09/2019 Neurosurgery wants to continue aspirin plus Plavix, continue to hold Xarelto Continue IV fluids, Protonix drip Frequent CBC, monitor closely  Acute blood loss anemia Likely due to above Management per above  Hypomagnesemia Replace PRN  Chronic atrial fibrillation Currently rate controlled Continue to hold digoxin, metoprolol, Xarelto  Brain aneurysm status post recent pipeline embolization of LICA on 0000000 Neurosurgery recommends continuing aspirin plus Plavix, to complete 3 months, last dose ~06/13/2019          Malnutrition Type:       Malnutrition Characteristics:      Nutrition Interventions:       Estimated body mass index is 21.5 kg/m as calculated from the following:   Height as of this encounter: 6' (1.829 m).   Weight as of this encounter: 71.9 kg.     Code Status: Full  Family Communication: None at bedside  Disposition Plan: To be determined, likely home   Consultants:  GI  Procedures:  None  Antimicrobials:  None  DVT prophylaxis: SCDs   Objective: Vitals:   06/08/19 2100 06/09/19 0300 06/09/19 0425 06/09/19 0758  BP:   116/76 112/81  Pulse: 85 92 88 88  Resp: 13 18 17 16   Temp: 97.8 F (36.6 C)  97.9 F (36.6 C) 98.1 F (36.7 C)  TempSrc: Oral  Oral Oral  SpO2: 100% 100% 100% 100%  Weight:   71.9 kg   Height:        Intake/Output Summary (Last 24 hours) at 06/09/2019 1126 Last data filed at 06/09/2019 0600 Gross per 24 hour  Intake 1831.33 ml  Output 350 ml  Net 1481.33 ml   Filed Weights   06/08/19 1420 06/08/19 2058 06/09/19 0425  Weight: 72.1 kg 73.2 kg 71.9 kg    Exam:  General: NAD   Cardiovascular: S1, S2 present  Respiratory: CTAB  Abdomen: Soft, nontender, nondistended, bowel sounds present  Musculoskeletal: No bilateral pedal edema noted  Skin: Normal  Psychiatry: Normal mood   Data Reviewed: CBC: Recent Labs  Lab 06/08/19 1427 06/09/19 0416 06/09/19 0943  WBC 9.0 6.4 5.1  NEUTROABS 6.0  --   --   HGB 13.0 9.8* 9.1*  HCT 39.2 28.6* 26.7*  MCV 100.3*  100.7* 100.8*  PLT 244 164 [id number]   Basic Metabolic Panel: Recent Labs  Lab 06/08/19 1427 06/09/19 0416  NA 135 138  K 4.3 4.8  CL 100 106  CO2 24 24  GLUCOSE 124* 112*  BUN 42* 34*  CREATININE 1.13 1.13  CALCIUM 9.6 8.4*  MG  --  1.5*  PHOS  --  3.4   GFR: Estimated Creatinine Clearance: 63.6 mL/min (by C-G formula based on SCr of 1.13 mg/dL). Liver Function Tests: Recent Labs  Lab 06/08/19 1427 06/09/19 0416  AST 23 17  ALT 15 14  ALKPHOS 65 46  BILITOT 0.5 0.5   PROT 7.0 4.7*  ALBUMIN 3.9 2.7*   No results for input(s): LIPASE, AMYLASE in the last 168 hours. No results for input(s): AMMONIA in the last 168 hours. Coagulation Profile: Recent Labs  Lab 06/08/19 1427  INR 2.1*   Cardiac Enzymes: No results for input(s): CKTOTAL, CKMB, CKMBINDEX, TROPONINI in the last 168 hours. BNP (last 3 results) No results for input(s): PROBNP in the last 8760 hours. HbA1C: No results for input(s): HGBA1C in the last 72 hours. CBG: No results for input(s): GLUCAP in the last 168 hours. Lipid Profile: No results for input(s): CHOL, HDL, LDLCALC, TRIG, CHOLHDL, LDLDIRECT in the last 72 hours. Thyroid Function Tests: Recent Labs    06/09/19 0416  TSH 3.911   Anemia Panel: No results for input(s): VITAMINB12, FOLATE, FERRITIN, TIBC, IRON, RETICCTPCT in the last 72 hours. Urine analysis:    Component Value Date/Time   COLORURINE YELLOW 03/08/2019 1139   APPEARANCEUR CLEAR 03/08/2019 1139   LABSPEC 1.016 03/08/2019 1139   PHURINE 6.0 03/08/2019 1139   GLUCOSEU NEGATIVE 03/08/2019 1139   HGBUR NEGATIVE 03/08/2019 1139   BILIRUBINUR NEGATIVE 03/08/2019 1139   KETONESUR NEGATIVE 03/08/2019 1139   PROTEINUR NEGATIVE 03/08/2019 1139   UROBILINOGEN 0.2 09/13/2012 0852   NITRITE NEGATIVE 03/08/2019 1139   LEUKOCYTESUR NEGATIVE 03/08/2019 1139   Sepsis Labs: @LABRCNTIP (procalcitonin:4,lacticidven:4)  ) Recent Results (from the past 240 hour(s))  SARS Coronavirus 2 [facility] order, Performed in [facility] hospital lab) Nasopharyngeal Nasopharyngeal Swab     Status: None   Collection Time: 06/08/19  5:12 PM   Specimen: Nasopharyngeal Swab  Result Value Ref Range Status   SARS Coronavirus 2 NEGATIVE NEGATIVE Final    Comment: (NOTE) If result is NEGATIVE SARS-CoV-2 target nucleic acids are NOT DETECTED. The SARS-CoV-2 RNA is generally detectable in upper and lower  respiratory specimens during the acute phase of infection. The lowest   concentration of SARS-CoV-2 viral copies this assay can detect is 250  copies / mL. A negative result does not preclude SARS-CoV-2 infection  and should not be used as the sole basis for treatment or other  patient management decisions.  A negative result may occur with  improper specimen collection / handling, submission of specimen other  than nasopharyngeal swab, presence of viral mutation(s) within the  areas targeted by this assay, and inadequate number of viral copies  (<250 copies / mL). A negative result must be combined with clinical  observations, patient history, and epidemiological information. If result is POSITIVE SARS-CoV-2 target nucleic acids are DETECTED. The SARS-CoV-2 RNA is generally detectable in upper and lower  respiratory specimens dur ing the acute phase of infection.  Positive  results are indicative of active infection with SARS-CoV-2.  Clinical  correlation with patient history and other diagnostic information is  necessary to determine patient infection status.  Positive results do  not  rule out bacterial infection or co-infection with other viruses. If result is PRESUMPTIVE POSTIVE SARS-CoV-2 nucleic acids MAY BE PRESENT.   A presumptive positive result was obtained on the submitted specimen  and confirmed on repeat testing.  While 2019 novel coronavirus  (SARS-CoV-2) nucleic acids may be present in the submitted sample  additional confirmatory testing may be necessary for epidemiological  and / or clinical management purposes  to differentiate between  SARS-CoV-2 and other Sarbecovirus currently known to infect humans.  If clinically indicated additional testing with an alternate test  methodology [phone]) is advised. The SARS-CoV-2 RNA is generally  detectable in upper and lower respiratory sp ecimens during the acute  phase of infection. The expected result is Negative. Fact Sheet for Patients:  StrictlyIdeas.no Fact Sheet  for Healthcare Providers: BankingDealers.co.za This test is not yet approved or cleared by the [place] FDA and has been authorized for detection and/or diagnosis of SARS-CoV-2 by FDA under an Emergency Use Authorization (EUA).  This EUA will remain in effect (meaning this test can be used) for the duration of the COVID-19 declaration under Section 564(b)(1) of the Act, 21 U.S.C. section 360bbb-3(b)(1), unless the authorization is terminated or revoked sooner. Performed at [facility], [address] [place], [place] [address]       Studies: No results found.  Scheduled Meds: . aspirin  325 mg Oral Daily  . clopidogrel  75 mg Oral Daily  . [START ON 06/12/2019] pantoprazole  40 mg Intravenous Q12H    Continuous Infusions: . sodium chloride 1,000 mL (06/09/19 0915)  . pantoprozole (PROTONIX) infusion 8 mg/hr (06/09/19 0422)     LOS: 1 day     Alma Friendly, MD [facility]  If 7PM-7AM, please contact night-coverage [email or web address] 06/09/2019, 11:26 AM

## 2019-06-09 NOTE — Consult Note (Signed)
Consultation  Referring Provider:     [facility] Primary Care Physician:  Enid Skeens., MD Primary Gastroenterologist:        Ardis Hughs Reason for Consultation:     GI bleed     Impression / Plan:   Acute Upper GI bleed with melena in setting of Xarelto, clopidogrel and ASA Acute blood loss anemia - symptomatic  Improved after hydration EGD today The risks and benefits as well as alternatives of endoscopic procedure(s) have been discussed and reviewed. All questions answered. The patient agrees to proceed.   I appreciate the opportunity to care for this patient.   Johnathan Mayer, MD, [facility] 06/09/2019 11:27 AM Pager [phone]         HPI:   Johnathan Bauer is a 68 y.o. 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  He was seen by cardiology yesterday and was hypotensive and dizzy and sent to the emergency department and was admitted.  Per my discussion with the ER physician yesterday the patient had melena and heme positive stool.  He was placed on a PPI infusion and aggressively hydrated.  CBC Latest Ref Rng & Units 06/09/2019 06/09/2019 06/08/2019  WBC 4.0 - 10.5 K/uL 5.1 6.4 9.0  Hemoglobin 13.0 - 17.0 g/dL 9.1(L) 9.8(L) 13.0  Hematocrit 39.0 - 52.0 % 26.7(L) 28.6(L) 39.2  Platelets 150 - 400 K/uL 154 164 244    No previous history of ulcer disease.  GI history notable for adenomatous colon polyps history of diverticulitis with resection due to perforation and colostomy takedown.  Last colonoscopy 2018 with diminutive adenomas.  He was not on PPI therapy.  No history of GERD etc.  He is feeling better since hydration.  Has not had Xarelto since yesterday.  Neurosurgery thought that he should continue his aspirin and clopidogrel for now.  His stop date on that is coming up in the  next few days as well of been 3 months on October 7, after his internal carotid artery intervention. Past Medical History:  Diagnosis Date  . Arthritis    "touch in my right thumb" (12/31/2014)  . Chronic atrial fibrillation (HCC)    did not like coumadin  . Chronic kidney disease 09/2012  . Coronary artery disease    s/p PCI Sept 2013 with DES OM  . Diverticulitis with perforation   . Dysrhythmia    A-fib  . Pneumonia 09/2012  . Stroke [facility]) 04/2016    Past Surgical History:  Procedure Laterality Date  . COLECTOMY WITH COLOSTOMY CREATION/HARTMANN PROCEDURE  2013  . COLONOSCOPY    . COLOSTOMY REVISION  09/02/2012   Procedure: COLON RESECTION SIGMOID;  Surgeon: Serafina Mitchell, MD;  Location: [facility] OR;  Service: Vascular;  Laterality: N/A;  Sigmoid resection with End Colostomy.  . COLOSTOMY TAKEDOWN N/A 06/06/2013   Procedure: LAPAROSCOPIC OSTOMY TAKEDOWN AND ANASTOMOSIS ;  Surgeon: Ralene Ok, MD;  Location: [place];  Service: General;  Laterality: N/A;  . CORONARY ANGIOPLASTY WITH STENT PLACEMENT  06/2012   "1"  . INCISIONAL HERNIA REPAIR  12/31/2014   w/mesh  . INCISIONAL HERNIA REPAIR N/A 12/31/2014   Procedure: OPEN INCISIONAL HERNIA REPAIR WITH MESH (TAR PROCEDURE);  Surgeon: Ralene Ok, MD;  Location: [place];  Service: General;  Laterality: N/A;  . INSERTION OF MESH N/A 12/31/2014  Procedure: INSERTION OF MESH;  Surgeon: Ralene Ok, MD;  Location: [place];  Service: General;  Laterality: N/A;  . IR 3D INDEPENDENT WKST  01/18/2019  . IR ANGIO INTRA EXTRACRAN SEL INTERNAL CAROTID BILAT MOD SED  01/18/2019  . IR ANGIO INTRA EXTRACRAN SEL INTERNAL CAROTID UNI L MOD SED  03/13/2019  . IR ANGIOGRAM EXTREMITY LEFT  01/18/2019  . IR ANGIOGRAM FOLLOW UP STUDY  03/13/2019  . IR TRANSCATH/EMBOLIZ  03/13/2019  . LAPAROTOMY  09/02/2012   Procedure: EXPLORATORY LAPAROTOMY;  Surgeon: Serafina Mitchell, MD;  [facility] OR;  Service: Vascular;  Laterality: N/A;  Exploratory Laparotomy with  Sigmoid resection with end colocstomy.  Marland Kitchen RADIOLOGY WITH ANESTHESIA N/A 03/13/2019   Procedure: P[employer];  Surgeon: Consuella Lose, MD;  Location: [place];  Service: Radiology;  Laterality: N/A;  . TONSILLECTOMY      Family History  Problem Relation Age of Onset  . Lymphoma Father   . Prostate cancer Paternal Grandfather   . Colon cancer Neg Hx     Social History   Tobacco Use  . Smoking status: Current Some Day Smoker    Packs/day: 0.10    Years: 45.00    Pack years: 4.50    Types: Cigarettes  . Smokeless tobacco: Never Used  Substance Use Topics  . Alcohol use: No  . 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 Authorizing Provider  albuterol (VENTOLIN HFA) 108 (90 Base) MCG/ACT inhaler Inhale 2-4 puffs into the lungs every 6 (six) hours as needed for shortness of breath or wheezing. 12/11/18   [provider]  aspirin EC 325 MG tablet Take 325 mg by mouth daily.     [provider]  clopidogrel (PLAVIX) 75 MG tablet Take 75 mg by mouth daily.    [provider]  digoxin (LANOXIN) 0.25 MG tablet TAKE 1 TABLET (250 MCG TOTAL) BY MOUTH DAILY. Patient taking differently: Take 0.25 mg by mouth daily.  02/08/19   Park Liter, MD  metoprolol tartrate (LOPRESSOR) 50 MG tablet TAKE 1 TABLET BY MOUTH EVERY DAY 06/05/19   Park Liter, MD  nitroGLYCERIN (NITROSTAT) 0.4 MG SL tablet Place 1 tablet (0.4 mg total) under the tongue every 5 (five) minutes as needed for chest pain. 01/15/19   Park Liter, MD  sertraline (ZOLOFT) 50 MG tablet Take 50 mg by mouth daily.    [provider]  XARELTO 20 MG TABS tablet Take 20 mg by mouth daily. 03/05/19    [provider]    Current Facility-Administered Medications  Medication Dose Route Frequency Provider Last Rate Last Dose  . 0.9 %  sodium chloride infusion   Intravenous PRN Toy Baker, MD 10 mL/hr at 06/09/19 0915 1,000 mL at 06/09/19 0915  . acetaminophen (TYLENOL) tablet 650 mg  650 mg Oral Q6H PRN Toy Baker, MD       Or  . acetaminophen (TYLENOL) suppository 650 mg  650 mg Rectal Q6H PRN Doutova, Anastassia, MD      . aspirin tablet 325 mg  325 mg Oral Daily Doutova, Anastassia, MD      . clopidogrel (PLAVIX) tablet 75 mg  75 mg Oral Daily Doutova, Anastassia, MD      . ondansetron (ZOFRAN) tablet 4 mg  4 mg Oral Q6H PRN Doutova, Anastassia, MD       Or  . ondansetron (ZOFRAN) injection 4 mg  4 mg Intravenous Q6H PRN Doutova, Anastassia, MD      . pantoprazole (PROTONIX) 80 mg in sodium chloride 0.9 % 250 mL (0.32 mg/mL) infusion  8 mg/hr Intravenous Continuous Doutova, Anastassia, MD 25 mL/hr at 06/09/19 0422 8 mg/hr at 06/09/19 0422  . [START ON 06/12/2019] pantoprazole (PROTONIX) injection 40 mg  40 mg Intravenous Q12H Toy Baker, MD        Allergies as of 06/08/2019 - Review Complete 06/08/2019  Allergen Reaction Noted  . Beef-derived products Hives 06/08/2019  . Codeine Nausea And Vomiting 12/23/2014  . Poison ivy extract [poison ivy extract] Hives 10/13/2012     Review of Systems:    This is positive for those things mentioned in the HPI.  He wears eyeglasses. All other review of systems are negative.       Physical Exam:  Vital signs in last 24 hours: Temp:  [97.8 F (36.6 C)-98.2 F (36.8 C)] 98.1 F (36.7 C) (10/03 0758) Pulse Rate:  [60-101] 88 (10/03 0758) Resp:  [13-21] 16 (10/03 0758) BP: (97-122)/(60-97) 112/81 (10/03 0758) SpO2:  [80 %-100 %] 100 % (10/03 0758) Weight:  [71.9 kg-73.2 kg] 71.9 kg (10/03 0425) Last BM Date: 06/08/19  General:  Well-developed, well-nourished and in no acute distress Eyes:  anicteric.  ENT:   Mouth and posterior pharynx free of lesions.  Neck:   supple w/o thyromegaly or mass.  Lungs: Clear to auscultation bilaterally. Heart:  S1S2, no rubs, murmurs, gallops. Abdomen:  soft, non-tender, no hepatosplenomegaly, hernia, or mass and BS+.  There is a midline abdominal scar Lymph:  no cervical or supraclavicular adenopathy. Extremities:   no edema Skin   no rash.  He is pale. Neuro:  A&O x 3.  Psych:  appropriate mood and  Affect.   Data Reviewed:   LAB RESULTS: Recent Labs    06/08/19 1427 06/09/19 0416 06/09/19 0943  WBC 9.0 6.4 5.1  HGB 13.0 9.8* 9.1*  HCT 39.2 28.6* 26.7*  PLT 244 164 154   BMET Recent Labs    06/08/19 1427 06/09/19 0416  NA 135 138  K 4.3 4.8  CL 100 106  CO2 24 24  GLUCOSE 124* 112*  BUN 42* 34*  CREATININE 1.13 1.13  CALCIUM 9.6 8.4*   LFT Recent Labs    06/09/19 0416  PROT 4.7*  ALBUMIN 2.7*  AST 17  ALT 14  ALKPHOS 46  BILITOT 0.5   PT/INR Recent Labs    06/08/19 1427  LABPROT 23.6*  INR 2.1*   Thanks   LOS: 1 day   @Marketia Stallsmith  Simonne Maffucci, MD, [facility] @  06/09/2019, 11:20 AM'

## 2019-06-10 DIAGNOSIS — D508 Other iron deficiency anemias: Secondary | ICD-10-CM

## 2019-06-10 DIAGNOSIS — D649 Anemia, unspecified: Secondary | ICD-10-CM

## 2019-06-10 LAB — BASIC METABOLIC PANEL
Anion gap: 5 (ref 5–15)
BUN: 24 mg/dL — ABNORMAL HIGH (ref 8–23)
CO2: 23 mmol/L (ref 22–32)
Calcium: 8.1 mg/dL — ABNORMAL LOW (ref 8.9–10.3)
Chloride: 109 mmol/L (ref 98–111)
Creatinine, Ser: 1.03 mg/dL (ref 0.61–1.24)
GFR calc Af Amer: >60 mL/min (ref >60)
GFR calc non Af Amer: >60 mL/min (ref >60)
Glucose, Bld: 87 mg/dL (ref 70–99)
Potassium: 4.2 mmol/L (ref 3.5–5.1)
Sodium: 137 mmol/L (ref 135–145)

## 2019-06-10 LAB — IRON AND TIBC
Iron: 23 ug/dL — ABNORMAL LOW (ref 45–182)
Saturation Ratios: 7 % — ABNORMAL LOW (ref 17.9–39.5)
TIBC: 312 ug/dL (ref 250–450)
UIBC: 289 ug/dL

## 2019-06-10 LAB — CBC
HCT: 24.4 % — ABNORMAL LOW (ref 39.0–52.0)
HCT: 25.7 % — ABNORMAL LOW (ref 39.0–52.0)
Hemoglobin: 8.5 g/dL — ABNORMAL LOW (ref 13.0–17.0)
Hemoglobin: 8.8 g/dL — ABNORMAL LOW (ref 13.0–17.0)
MCH: 35.4 pg — ABNORMAL HIGH (ref 26.0–34.0)
MCH: 35.3 pg — ABNORMAL HIGH (ref 26.0–34.0)
MCHC: 34.8 g/dL (ref 30.0–36.0)
MCHC: 34.2 g/dL (ref 30.0–36.0)
MCV: 101.7 fL — ABNORMAL HIGH (ref 80.0–100.0)
MCV: 103.2 fL — ABNORMAL HIGH (ref 80.0–100.0)
Platelets: 146 10*3/uL — ABNORMAL LOW (ref 150–400)
Platelets: 172 10*3/uL (ref 150–400)
RBC: 2.4 MIL/uL — ABNORMAL LOW (ref 4.22–5.81)
RBC: 2.49 MIL/uL — ABNORMAL LOW (ref 4.22–5.81)
RDW: 15 % (ref 11.5–15.5)
RDW: 15.4 % (ref 11.5–15.5)
WBC: 4.6 10*3/uL (ref 4.0–10.5)
WBC: 4.8 10*3/uL (ref 4.0–10.5)
nRBC: 0 % (ref 0.0–0.2)
nRBC: 0 % (ref 0.0–0.2)

## 2019-06-10 LAB — FOLATE: Folate: 10.1 ng/mL (ref >5.9)

## 2019-06-10 LAB — VITAMIN B12: Vitamin B-12: 284 pg/mL (ref 180–914)

## 2019-06-10 LAB — MAGNESIUM: Magnesium: 1.8 mg/dL (ref 1.7–2.4)

## 2019-06-10 LAB — FERRITIN: Ferritin: 25 ng/mL (ref 24–336)

## 2019-06-10 MED ORDER — DIGOXIN 250 MCG PO TABS
0.2500 mg | ORAL_TABLET | Freq: Every day | ORAL | Status: DC
  Administered 2019-06-10: 0.25 mg via ORAL
  Filled 2019-06-10: qty 1

## 2019-06-10 MED ORDER — METOPROLOL TARTRATE 50 MG PO TABS
50.0000 mg | ORAL_TABLET | Freq: Every day | ORAL | Status: DC
  Administered 2019-06-10: 50 mg via ORAL
  Filled 2019-06-10: qty 1

## 2019-06-10 MED ORDER — SODIUM CHLORIDE 0.9 % IV SOLN
510.0000 mg | Freq: Once | INTRAVENOUS | Status: DC
  Filled 2019-06-10: qty 17

## 2019-06-10 MED ORDER — SODIUM CHLORIDE 0.9 % IV BOLUS
1000.0000 mL | Freq: Once | INTRAVENOUS | Status: AC
  Administered 2019-06-10: 1000 mL via INTRAVENOUS

## 2019-06-10 MED ORDER — CYANOCOBALAMIN 1000 MCG/ML IJ SOLN
1000.0000 ug | Freq: Once | INTRAMUSCULAR | Status: AC
  Administered 2019-06-10: 18:00:00 1000 ug via INTRAMUSCULAR
  Filled 2019-06-10: qty 1

## 2019-06-10 MED ORDER — SERTRALINE HCL 50 MG PO TABS
50.0000 mg | ORAL_TABLET | Freq: Once | ORAL | Status: AC
  Administered 2019-06-10: 50 mg via ORAL
  Filled 2019-06-10: qty 1

## 2019-06-10 MED ORDER — SODIUM CHLORIDE 0.9 % IV SOLN
INTRAVENOUS | Status: DC
  Administered 2019-06-10 – 2019-06-12 (×4): via INTRAVENOUS

## 2019-06-10 NOTE — Progress Notes (Signed)
Patients heart rate went down to 38 unsustained then back up to the 70s. Blood pressure 90/62. Patient sitting up in chair. At the time, he called out complaining of a dizzy spell. I paged Dr. Horris Latino to make her aware.

## 2019-06-10 NOTE — Progress Notes (Signed)
I was about to start new faraheme drug when I checked patients blood pressure and it was 89/65. Patient is not symptomatic but this is lower than he has for my shift. The drug faraheme according to Edrick Kins D is one of many side effects including hypotension which I am to monitor closely for reaction with administration of the drug. Mitzi Hansen recommended that I push the drug to later in shift to make sure patients blood pressure comes up. I texted Dr. Horris Latino to make her aware, awaiting return of call

## 2019-06-10 NOTE — Progress Notes (Addendum)
PROGRESS NOTE  Johnathan Bauer [id number] DOB: 1951/06/04 DOA: 06/08/2019 PCP: Enid Skeens., MD  HPI/Recap of past 24 hours: HPI from Dr [place] Johnathan Bauer is a 68 year old male with past medical history significant for CAD, atrial fibrillation, brain aneurysm status post recent pipeline embolization of LICA who presented to [place] with dizziness, hypotension and dark stools.  Complicated by current use of Xarelto, Plavix and aspirin.  He was found to have a hemoglobin of 13.0, down from 17.1 March 08, 2019.  He was FOBT positive.  Patient transferred to [facility] for further management.  GI consulted.   Early this morning, patient reported feeling dizzy while he was being weighed, heart rate up to 160s noted to be in A. fib with RVR.  While working with PT, also felt a dizzy spell.  Heart rate went down to 38 unsustained and back up to 70s, also noted to be hypotensive. No further episodes of black stools  Assessment/Plan: Active Problems:   Chronic atrial fibrillation ([place]): Anticoagulated his chads 2 Vas score equals 3   Arteriosclerotic cardiovascular disease (ASCVD)   Chronic anticoagulation   Essential hypertension   Cerebral aneurysm, nonruptured   UGIB (upper gastrointestinal bleed)   Melena   Erosive gastritis with hemorrhage   Absolute anemia  Upper GI bleeding Likely in the setting of taking Xarelto, aspirin, Plavix Hemoglobin downtrending, baseline around 16 Type and screen done GI on board, s/p endoscopy on 06/09/2019 showed diffuse hemorrhagic gastritis, recommending continuing PPI infusion for 72 hours total, serial hemoglobin and transfuse when less than 8 Neurosurgery recommend continuing aspirin only, d/c Plavix, and continue to hold Xarelto for about a week total Continue IV fluids, Protonix drip, switch to daily oral Protonix from 06/11/2019 as per GI Frequent CBC, monitor closely  Acute blood loss anemia/iron deficiency/vitamin  B12 def Anemia panel: Iron 23, TIBC 312, sats 7, Vit B12 284 Status post vitamin B12 intramuscular, Feraheme x1 Transfuse when hemoglobin less than 8 Management per above  Orthostatic hypotension Likely due to above IVF Orthostatic vitals every shift  Hypomagnesemia Replace PRN  Chronic atrial fibrillation Currently rate controlled, had an episode of RVR on 10/4, after his received digoxin and lopressor had an episode of non-sustained brady Continue to hold digoxin, metoprolol, Xarelto  Brain aneurysm status post recent pipeline embolization of LICA on 0000000 Neurosurgery recommends continuing aspirin to complete 3 months, last dose ~06/13/2019       Malnutrition Type:      Malnutrition Characteristics:      Nutrition Interventions:       Estimated body mass index is 21.48 kg/m as calculated from the following:   Height as of this encounter: 6' (1.829 m).   Weight as of this encounter: 71.8 kg.     Code Status: Full  Family Communication: None at bedside  Disposition Plan: To be determined, likely home   Consultants:  GI  Procedures:  EGD on 06/09/19  Antimicrobials:  None  DVT prophylaxis: SCDs   Objective: Vitals:   06/10/19 0740 06/10/19 0931 06/10/19 1100 06/10/19 1457  BP:  116/82  90/62  Pulse: 89  91   Resp:  16  17  Temp: 97.8 F (36.6 C)  97.9 F (36.6 C)   TempSrc: Oral  Oral   SpO2: 100%     Weight:      Height:        Intake/Output Summary (Last 24 hours) at 06/10/2019 1542 Last data filed at 06/10/2019  0600 Gross per 24 hour  Intake 630.1 ml  Output 700 ml  Net -69.9 ml   Filed Weights   06/08/19 2058 06/09/19 0425 06/10/19 0503  Weight: 73.2 kg 71.9 kg 71.8 kg    Exam:  General: NAD   Cardiovascular: S1, S2 present  Respiratory: CTAB  Abdomen: Soft, nontender, nondistended, bowel sounds present  Musculoskeletal: No bilateral pedal edema noted  Skin: Normal  Psychiatry: Normal mood   Data  Reviewed: CBC: Recent Labs  Lab 06/08/19 1427 06/09/19 0416 06/09/19 0943 06/09/19 1804 06/10/19 0225  WBC 9.0 6.4 5.1 4.5 4.6  NEUTROABS 6.0  --   --   --   --   HGB 13.0 9.8* 9.1* 8.8* 8.5*  HCT 39.2 28.6* 26.7* 25.9* 24.4*  MCV 100.3* 100.7* 100.8* 101.2* 101.7*  PLT 244 164 154 156 [id number]*   Basic Metabolic Panel: Recent Labs  Lab 06/08/19 1427 06/09/19 0416 06/10/19 0225  NA 135 138 137  K 4.3 4.8 4.2  CL 100 106 109  CO2 24 24 23   GLUCOSE 124* 112* 87  BUN 42* 34* 24*  CREATININE 1.13 1.13 1.03  CALCIUM 9.6 8.4* 8.1*  MG  --  1.5* 1.8  PHOS  --  3.4  --    GFR: Estimated Creatinine Clearance: 69.8 mL/min (by C-G formula based on SCr of 1.03 mg/dL). Liver Function Tests: Recent Labs  Lab 06/08/19 1427 06/09/19 0416  AST 23 17  ALT 15 14  ALKPHOS 65 46  BILITOT 0.5 0.5  PROT 7.0 4.7*  ALBUMIN 3.9 2.7*   No results for input(s): LIPASE, AMYLASE in the last 168 hours. No results for input(s): AMMONIA in the last 168 hours. Coagulation Profile: Recent Labs  Lab 06/08/19 1427  INR 2.1*   Cardiac Enzymes: No results for input(s): CKTOTAL, CKMB, CKMBINDEX, TROPONINI in the last 168 hours. BNP (last 3 results) No results for input(s): PROBNP in the last 8760 hours. HbA1C: No results for input(s): HGBA1C in the last 72 hours. CBG: No results for input(s): GLUCAP in the last 168 hours. Lipid Profile: No results for input(s): CHOL, HDL, LDLCALC, TRIG, CHOLHDL, LDLDIRECT in the last 72 hours. Thyroid Function Tests: Recent Labs    06/09/19 0416  TSH 3.911   Anemia Panel: Recent Labs    06/10/19 1247  VITAMINB12 284  FOLATE 10.1  FERRITIN 25  TIBC 312  IRON 23*   Urine analysis:    Component Value Date/Time   COLORURINE YELLOW 03/08/2019 1139   APPEARANCEUR CLEAR 03/08/2019 1139   LABSPEC 1.016 03/08/2019 1139   PHURINE 6.0 03/08/2019 1139   GLUCOSEU NEGATIVE 03/08/2019 1139   HGBUR NEGATIVE 03/08/2019 1139   BILIRUBINUR NEGATIVE  03/08/2019 1139   KETONESUR NEGATIVE 03/08/2019 1139   PROTEINUR NEGATIVE 03/08/2019 1139   UROBILINOGEN 0.2 09/13/2012 0852   NITRITE NEGATIVE 03/08/2019 1139   LEUKOCYTESUR NEGATIVE 03/08/2019 1139   Sepsis Labs: @LABRCNTIP (procalcitonin:4,lacticidven:4)  ) Recent Results (from the past 240 hour(s))  SARS Coronavirus 2 [facility] order, Performed in [facility] hospital lab) Nasopharyngeal Nasopharyngeal Swab     Status: None   Collection Time: 06/08/19  5:12 PM   Specimen: Nasopharyngeal Swab  Result Value Ref Range Status   SARS Coronavirus 2 NEGATIVE NEGATIVE Final    Comment: (NOTE) If result is NEGATIVE SARS-CoV-2 target nucleic acids are NOT DETECTED. The SARS-CoV-2 RNA is generally detectable in upper and lower  respiratory specimens during the acute phase of infection. The lowest  concentration of SARS-CoV-2 viral copies this  assay can detect is 250  copies / mL. A negative result does not preclude SARS-CoV-2 infection  and should not be used as the sole basis for treatment or other  patient management decisions.  A negative result may occur with  improper specimen collection / handling, submission of specimen other  than nasopharyngeal swab, presence of viral mutation(s) within the  areas targeted by this assay, and inadequate number of viral copies  (<250 copies / mL). A negative result must be combined with clinical  observations, patient history, and epidemiological information. If result is POSITIVE SARS-CoV-2 target nucleic acids are DETECTED. The SARS-CoV-2 RNA is generally detectable in upper and lower  respiratory specimens dur ing the acute phase of infection.  Positive  results are indicative of active infection with SARS-CoV-2.  Clinical  correlation with patient history and other diagnostic information is  necessary to determine patient infection status.  Positive results do  not rule out bacterial infection or co-infection with other viruses. If result  is PRESUMPTIVE POSTIVE SARS-CoV-2 nucleic acids MAY BE PRESENT.   A presumptive positive result was obtained on the submitted specimen  and confirmed on repeat testing.  While 2019 novel coronavirus  (SARS-CoV-2) nucleic acids may be present in the submitted sample  additional confirmatory testing may be necessary for epidemiological  and / or clinical management purposes  to differentiate between  SARS-CoV-2 and other Sarbecovirus currently known to infect humans.  If clinically indicated additional testing with an alternate test  methodology [phone]) is advised. The SARS-CoV-2 RNA is generally  detectable in upper and lower respiratory sp ecimens during the acute  phase of infection. The expected result is Negative. Fact Sheet for Patients:  StrictlyIdeas.no Fact Sheet for Healthcare Providers: BankingDealers.co.za This test is not yet approved or cleared by the [place] FDA and has been authorized for detection and/or diagnosis of SARS-CoV-2 by FDA under an Emergency Use Authorization (EUA).  This EUA will remain in effect (meaning this test can be used) for the duration of the COVID-19 declaration under Section 564(b)(1) of the Act, 21 U.S.C. section 360bbb-3(b)(1), unless the authorization is terminated or revoked sooner. Performed at [facility], [address] [place], [place] [address]       Studies: No results found.  Scheduled Meds: . aspirin  325 mg Oral Daily  . cyanocobalamin  1,000 mcg Intramuscular Once  . [START ON 06/12/2019] pantoprazole  40 mg Intravenous Q12H    Continuous Infusions: . sodium chloride 10 mL/hr at 06/09/19 1555  . sodium chloride 100 mL/hr at 06/10/19 1510  . ferumoxytol    . pantoprozole (PROTONIX) infusion 8 mg/hr (06/10/19 1255)     LOS: 2 days     Alma Friendly, MD [facility]  If 7PM-7AM, please contact night-coverage [email or web address] 06/10/2019,  3:42 PM

## 2019-06-10 NOTE — Progress Notes (Signed)
Telemetry tech called this RN, pt's HR up to 160's (Afib RVR) non sustaining. NT was weighing pt. Pt stated " I feel dizzy". Pt advised not to stand up abruptly from sitting position. Pt educated of   06/10/19 0500  Vitals  BP 124/83  MAP (mmHg) 94  ECG Heart Rate (!) 159  Resp 13  Oxygen Therapy  SpO2 100 %  O2 Device Room Air  MEWS Score  MEWS RR 1  MEWS Pulse 3  MEWS Systolic 0  MEWS LOC 0  MEWS Temp 0  MEWS Score 4  MEWS Score Color Red   fall prevention safety plan with understanding. NP [place] was notified and no new orders given. Will keep on monitoring pt.

## 2019-06-10 NOTE — Progress Notes (Signed)
Physical Therapy Treatment Patient Details Name: TRACER PALMS MRN: [id number] DOB: 22-Mar-1951 Today's Date: 06/10/2019    History of Present Illness 68yo male presenting to [place] with dizziness, hypotension, dark stools, Hgb 13 and FOBT positive in ED. Transferred to [facility] for likely upper GI bleed and acute blood loss anemia. PMH CAD, A-fib, brain aneurysm s/p pipeline embolization of L ICA, CVA, colectomy/colostomy, hernia repair    PT Comments    Patient progressing slowly towards PT goals. Tolerated gait training today with close min guard-Min A for balance/safety due to dizziness. HR better controlled today ranging from 70-90s bpm A fib. Continues to have an abnormal BP response to exercise.  Supine BP 94/73  Sitting BP 101/81 Standing BP 82/66 After walking some BP 80/49 Pt endorses dizziness throughout session. Poor self awareness to sit despite being dizzy needing max cues. Education re: not walking when dizzy to prevent falls. Pt may need ted hose or ace wraps to see if this helps decrease drop in BP. Pt highly motivated to mobilize and get home. Will continue to follow and progress as tolerated.   Follow Up Recommendations  No PT follow up;Supervision for mobility/OOB(pending progress and BP)     Equipment Recommendations  None recommended by PT    Recommendations for Other Services       Precautions / Restrictions Precautions Precautions: Fall Precaution Comments: watch BP and HR Restrictions Weight Bearing Restrictions: No    Mobility  Bed Mobility Overal bed mobility: Modified Independent             General bed mobility comments: Dizziness getting to EOB. Use of rail.  Transfers Overall transfer level: Needs assistance Equipment used: None Transfers: Sit to/from Stand Sit to Stand: Min guard         General transfer comment: Min guard for safety. pt falling backwards due to dizziness in standing requiring min A for stability. + dizziness.  Stood from [employer].  Ambulation/Gait Ambulation/Gait assistance: Min guard;Min assist Gait Distance (Feet): 200 Feet Assistive device: IV Pole Gait Pattern/deviations: Step-through pattern;Decreased stride length;Drifts right/left Gait velocity: decreased   General Gait Details: Slow, mildly unsteady gait holding onto IV pole for support, mild drifting noted. Dizziness did not improve. HR controlled in A-fib 70-90s bpm.   Stairs             Wheelchair Mobility    Modified Rankin (Stroke Patients Only)       Balance                                            Cognition Arousal/Alertness: Awake/alert Behavior During Therapy: WFL for tasks assessed/performed Overall Cognitive Status: Within Functional Limits for tasks assessed                                 General Comments: A&Ox4 however pt still wanting to walk even though he felt dizzy. Not the best historian unless pushed for info      Exercises      General Comments General comments (skin integrity, edema, etc.): Wife prsent during session.      Pertinent Vitals/Pain Pain Assessment: No/denies pain    Home Living                      Prior Function  PT Goals (current goals can now be found in the care plan section) Progress towards PT goals: Progressing toward goals    Frequency    Min 3X/week      PT Plan Current plan remains appropriate    Co-evaluation              AM-PAC PT "6 Clicks" Mobility   Outcome Measure  Help needed turning from your back to your side while in a flat bed without using bedrails?: None Help needed moving from lying on your back to sitting on the side of a flat bed without using bedrails?: None Help needed moving to and from a bed to a chair (including a wheelchair)?: A Little Help needed standing up from a chair using your arms (e.g., wheelchair or bedside chair)?: A Little Help needed to walk in hospital  room?: A Little Help needed climbing 3-5 steps with a railing? : A Little 6 Click Score: 20    End of Session Equipment Utilized During Treatment: Gait belt Activity Tolerance: Treatment limited secondary to medical complications (Comment)(drop in BP) Patient left: in chair;with call bell/phone within reach;with family/visitor present Nurse Communication: Mobility status(BP) PT Visit Diagnosis: Unsteadiness on feet (R26.81);Muscle weakness (generalized) (M62.81)     Time: [id number] PT Time Calculation (min) (ACUTE ONLY): 22 min  Charges:  $Gait Training: 8-22 mins                     Wray Kearns, PT, DPT Acute Rehabilitation Services Pager [phone] Office [place] 06/10/2019, 3:45 PM

## 2019-06-10 NOTE — Progress Notes (Addendum)
Patient ID: Johnathan Bauer, male   DOB: Aug 01, 1951, 68 y.o.   MRN: [id number]    Progress Note   Subjective  Day # 2 CC: GI bleed, in setting of aspirin Plavix and Xarelto  EGD yesterday-diffuse moderate inflammation of the entire stomach erosions erythema friability and patchy mild inflammation in the duodenal bulb.  Biopsies pending  Hgb-8.5  Heart rate 160s earlier this a.m./A. fib-was standing at the time, heart rate currently in the 90s  Patient says he feels fine, no complaints of abdominal discomfort, no nausea, has been eating solid food.  He had 1 dark bowel movement last evening, no stool since       Objective   Vital signs in last 24 hours: Temp:  [97.8 F (36.6 C)-98.4 F (36.9 C)] 97.8 F (36.6 C) (10/04 0740) Pulse Rate:  [64-89] 89 (10/04 0740) Resp:  [13-21] 19 (10/04 0738) BP: (96-124)/(62-84) 100/62 (10/04 0738) SpO2:  [96 %-100 %] 100 % (10/04 0740) Weight:  [71.8 kg] 71.8 kg (10/04 0503) Last BM Date: 06/09/19 General:    Older white male in NAD, pleasant, wife at bedside Heart:  irRegular rate and rhythm; no murmurs Lungs: Respirations even and unlabored, lungs CTA bilaterally Abdomen:  Soft, nontender and nondistended. Normal bowel sounds. Extremities:  Without edema. Neurologic:  Alert and oriented,  grossly normal neurologically. Psych:  Cooperative. Normal mood and affect.   Lab Results: Recent Labs    06/09/19 0943 06/09/19 1804 06/10/19 0225  WBC 5.1 4.5 4.6  HGB 9.1* 8.8* 8.5*  HCT 26.7* 25.9* 24.4*  PLT 154 156 146*   BMET Recent Labs    06/08/19 1427 06/09/19 0416 06/10/19 0225  NA 135 138 137  K 4.3 4.8 4.2  CL 100 106 109  CO2 24 24 23   GLUCOSE 124* 112* 87  BUN 42* 34* 24*  CREATININE 1.13 1.13 1.03  CALCIUM 9.6 8.4* 8.1*   Lab Results  Component Value Date   FERRITIN 25 06/10/2019   Lab Results  Component Value Date   VITAMINB12 284 06/10/2019      Assessment / Plan:    #39 68 year old white male with  acute upper GI bleed with melena and anemia in setting of Xarelto, Plavix and aspirin.  Patient has a diffuse hemorrhagic gastritis on EGD. No active bleeding overnight, hemoglobin has drifted .  Hemoglobin 13 down to 8.5 today  Plavix now on hold since yesterday, continues on aspirin Xarelto on hold   #2 cerebral aneurysm status post recent pipeline embolization of LICA  #3 coronary artery disease #4.  Atrial fibrillation  Plan; continue PPI infusion x72 hours total Neurosurgery okayed holding  Plavix, will continue aspirin Continue serial hemoglobins and transfuse for hemoglobin less than 8 as patient is I symptomatic and has had a significant drop over the past 6 hours Hold Xarelto also  Follow-up biopsies suspect aspirin induced injury but H. pylori is possible     LOS: 2 days   Amy Esterwood PA-C 06/10/2019, 9:11 AM    [facility] Attending   I have taken an interval history, reviewed the chart and examined the patient. I agree with the Advanced Practitioner's note, impression and recommendations.   Erosive gastritis/duodenitis in the setting of anticoagulation (triple therapy) with acute blood loss anemia and iron deficiency anemia   He is without signs of bleeding.  We will continue the PPI infusion though I think we could switch it over to a daily oral PPI tomorrow.  I do not think  he needs twice daily PPI necessarily.  I think he should remain off Xarelto for about a week total, he is going to stay off Plavix and he can continue the 325 mg aspirin and cover it with a daily PPI.  If he is H. pylori positive we will treated though that can wait until he sees me in follow-up.  With everything that he has been through I would not add that complicated regimen now in the hospital I do not think it is urgent he can heal with PPI.  I told him I thought he be in the hospital at least another 2 days i.e. at least until October 6.  I am ordering 1 injection of B12 given his level  of 284 and 1 Feraheme  When he does go home the first thing I would set him up for would be a CBC and my name through our office 3 to 5 days after discharge.  We can then determine when in person visit is necessary as I will need to fit him into my schedule unless there is a visit with an app within 3 to 4 weeks we can schedule that.  Amy Trellis Paganini would be good for that since she has familiarity.  He will need a colonoscopy to complete the work-up for iron deficiency anemia as well as an outpatient.  [name], MD, [facility] 06/10/2019 3:28 PM Pager [phone]

## 2019-06-11 ENCOUNTER — Encounter (HOSPITAL_COMMUNITY): Payer: Self-pay | Admitting: Internal Medicine

## 2019-06-11 DIAGNOSIS — Z7901 Long term (current) use of anticoagulants: Secondary | ICD-10-CM

## 2019-06-11 LAB — BASIC METABOLIC PANEL
Anion gap: 6 (ref 5–15)
BUN: 24 mg/dL — ABNORMAL HIGH (ref 8–23)
CO2: 21 mmol/L — ABNORMAL LOW (ref 22–32)
Calcium: 8.2 mg/dL — ABNORMAL LOW (ref 8.9–10.3)
Chloride: 111 mmol/L (ref 98–111)
Creatinine, Ser: 1.16 mg/dL (ref 0.61–1.24)
GFR calc Af Amer: >60 mL/min (ref >60)
GFR calc non Af Amer: >60 mL/min (ref >60)
Glucose, Bld: 105 mg/dL — ABNORMAL HIGH (ref 70–99)
Potassium: 5 mmol/L (ref 3.5–5.1)
Sodium: 138 mmol/L (ref 135–145)

## 2019-06-11 LAB — CBC
HCT: 24.2 % — ABNORMAL LOW (ref 39.0–52.0)
HCT: 24.7 % — ABNORMAL LOW (ref 39.0–52.0)
Hemoglobin: 8.1 g/dL — ABNORMAL LOW (ref 13.0–17.0)
Hemoglobin: 8.1 g/dL — ABNORMAL LOW (ref 13.0–17.0)
MCH: 34.5 pg — ABNORMAL HIGH (ref 26.0–34.0)
MCH: 34.3 pg — ABNORMAL HIGH (ref 26.0–34.0)
MCHC: 33.5 g/dL (ref 30.0–36.0)
MCHC: 32.8 g/dL (ref 30.0–36.0)
MCV: 103 fL — ABNORMAL HIGH (ref 80.0–100.0)
MCV: 104.7 fL — ABNORMAL HIGH (ref 80.0–100.0)
Platelets: 151 10*3/uL (ref 150–400)
Platelets: 172 10*3/uL (ref 150–400)
RBC: 2.35 MIL/uL — ABNORMAL LOW (ref 4.22–5.81)
RBC: 2.36 MIL/uL — ABNORMAL LOW (ref 4.22–5.81)
RDW: 15.6 % — ABNORMAL HIGH (ref 11.5–15.5)
RDW: 15.4 % (ref 11.5–15.5)
WBC: 5.4 10*3/uL (ref 4.0–10.5)
WBC: 4.7 10*3/uL (ref 4.0–10.5)
nRBC: 0 % (ref 0.0–0.2)
nRBC: 0 % (ref 0.0–0.2)

## 2019-06-11 MED ORDER — PANTOPRAZOLE SODIUM 40 MG PO TBEC
40.0000 mg | DELAYED_RELEASE_TABLET | Freq: Two times a day (BID) | ORAL | Status: DC
  Administered 2019-06-11 – 2019-06-13 (×4): 40 mg via ORAL
  Filled 2019-06-11 (×5): qty 1

## 2019-06-11 MED ORDER — PANTOPRAZOLE SODIUM 40 MG PO TBEC: 40.0000 mg | DELAYED_RELEASE_TABLET | Freq: Two times a day (BID) | ORAL | Status: DC

## 2019-06-11 NOTE — Evaluation (Signed)
Occupational Therapy Evaluation Patient Details Name: Johnathan Bauer MRN: [id number] DOB: 13-Jun-1951 Today's Date: 06/11/2019    History of Present Illness 68yo male presenting to [facility] with dizziness, hypotension, dark stools, Hgb 13 and FOBT positive in ED. Transferred to [facility] for likely upper GI bleed and acute blood loss anemia. PMH CAD, A-fib, brain aneurysm s/p pipeline embolization of L ICA, CVA, colectomy/colostomy, hernia repair   Clinical Impression   Pt with decline in function and safety with ADLs and ADL mobility with impaired balance and activity tolerance. Pt with dizziness upon standing and required cues to slow down sped of pace during sit - stand transitions and encouraged rest  Breaks throughout activity in his room with assist for safety. Pt reports that PTA, he lived at home with his wife and was independent with ADLs/selfcare and mobility with no AD. Pt currently requires min guard A with LB ADLs, ADL tasks in standing and transfers/mobility for safety due to dizziness with decreased BP and mild balance impairments. Pt's wife present during session. Pt would benefit from acute OT services to address impairments to maximize level of function and safety.  Supine BP 101/75  Sitting BP 95/79 Standing BP 99/66 After walking to bathroom BP 91/69 Pt reports minimal dizziness during sit - stand transitions from bed and toilet, but that it subsides in a few seconds..     Follow Up Recommendations  No OT follow up;Supervision - Intermittent    Equipment Recommendations  Tub/shower seat;Other (comment)(reacher)    Recommendations for Other Services       Precautions / Restrictions Precautions Precautions: Fall Precaution Comments: watch BP and HR Restrictions Weight Bearing Restrictions: No      Mobility Bed Mobility Overal bed mobility: Modified Independent             General bed mobility comments: Dizziness getting to EOB. Use of  rail.  Transfers Overall transfer level: Needs assistance Equipment used: None Transfers: Sit to/from Stand Sit to Stand: Min guard         General transfer comment: min guard A for safety. Pt reports minial dizziness upon standing at EOB and from toilet    Balance                                           ADL either performed or assessed with clinical judgement   ADL Overall ADL's : Needs assistance/impaired Eating/Feeding: Independent;Sitting   Grooming: Wash/dry hands;Wash/dry face;Min guard;Standing;With caregiver independent assisting   Upper Body Bathing: Independent;Sitting   Lower Body Bathing: Min guard;With caregiver independent assisting;Sit to/from stand   Upper Body Dressing : Independent;Sitting   Lower Body Dressing: Min guard;Sit to/from stand;With caregiver independent assisting   Toilet Transfer: Min guard;Ambulation;Regular Toilet;Grab bars   Toileting- Clothing Manipulation and Hygiene: Min guard;Sit to/from stand   Tub/ Shower Transfer: Min guard;Ambulation;Grab bars   Functional mobility during ADLs: Min guard General ADL Comments: min guard A for safety     Vision Baseline Vision/History: Wears glasses Wears Glasses: Reading only Patient Visual Report: No change from baseline       Perception     Praxis      Pertinent Vitals/Pain Pain Assessment: No/denies pain     Hand Dominance Right   Extremity/Trunk Assessment Upper Extremity Assessment Upper Extremity Assessment: Defer to OT evaluation   Lower Extremity Assessment Lower Extremity Assessment: Defer to PT  evaluation   Cervical / Trunk Assessment Cervical / Trunk Assessment: Normal   Communication Communication Communication: No difficulties   Cognition Arousal/Alertness: Awake/alert Behavior During Therapy: WFL for tasks assessed/performed Overall Cognitive Status: Within Functional Limits for tasks assessed                                  General Comments: pt reports minimal dizziness upon standing at EOB and from toilet that subsuded in a few seconds   General Comments       Exercises     Shoulder Instructions      Home Living Family/patient expects to be discharged to:: Private residence Living Arrangements: Spouse/significant other Available Help at Discharge: Available 24 hours/day Type of Home: House Home Access: Stairs to enter [employer] of Steps: 1 Entrance Stairs-Rails: None Home Layout: Multi-level   Alternate Level Stairs-Rails: Can reach both Bathroom Shower/Tub: Teacher, early years/pre: Standard Bathroom Accessibility: Yes   Home Equipment: None          Prior Functioning/Environment Level of Independence: Independent                 OT Problem List: Decreased activity tolerance;Impaired balance (sitting and/or standing);Decreased knowledge of use of DME or AE      OT Treatment/Interventions: Self-care/ADL training;DME and/or AE instruction;Therapeutic activities;Therapeutic exercise;Patient/family education    OT Goals(Current goals can be found in the care plan section) Acute Rehab OT Goals Patient Stated Goal: go home, feel better OT Goal Formulation: With patient/family Time For Goal Achievement: 06/18/19 Potential to Achieve Goals: Good ADL Goals Pt Will Perform Grooming: with set-up;with supervision;sitting;with caregiver independent in assisting Pt Will Perform Lower Body Bathing: with supervision;with set-up;sit to/from stand;with caregiver independent in assisting Pt Will Perform Lower Body Dressing: with set-up;with supervision;sit to/from stand;with caregiver independent in assisting Pt Will Transfer to Toilet: with supervision;with modified independence;ambulating Pt Will Perform Toileting - Clothing Manipulation and hygiene: with supervision;sit to/from stand;with caregiver independent in assisting Pt Will Perform Tub/Shower Transfer: with  supervision;ambulating;shower seat;grab bars;with caregiver independent in assisting  OT Frequency: Min 2X/week   Barriers to D/C:    no barriers       Co-evaluation              AM-PAC OT "6 Clicks" Daily Activity     Outcome Measure Help from another person eating meals?: None Help from another person taking care of personal grooming?: A Little Help from another person toileting, which includes using toliet, bedpan, or urinal?: A Little Help from another person bathing (including washing, rinsing, drying)?: A Little Help from another person to put on and taking off regular upper body clothing?: None Help from another person to put on and taking off regular lower body clothing?: A Little 6 Click Score: 20   End of Session Equipment Utilized During Treatment: Gait belt  Activity Tolerance: Patient tolerated treatment well Patient left: in bed;with call bell/phone within reach;with family/visitor present  OT Visit Diagnosis: Unsteadiness on feet (R26.81);Other abnormalities of gait and mobility (R26.89);Muscle weakness (generalized) (M62.81);Dizziness and giddiness (R42)                Time: [id number] OT Time Calculation (min): 23 min Charges:  OT General Charges $OT Visit: 1 Visit OT Evaluation $OT Eval Moderate Complexity: 1 Mod    Britt Bottom 06/11/2019, 3:47 PM

## 2019-06-11 NOTE — Progress Notes (Signed)
Feraheme IV not given bec.B/P still low below 100systolic ;MD on call [place] made aware  & said to hold it tonight.

## 2019-06-11 NOTE — Consult Note (Signed)
   [facility] CM Inpatient Consult   06/11/2019  JAYCOB RAMAKRISHNAN [birth year] [id number]   Patientwas checked for 22% high risk score for unplanned readmission and hospitalizations,and for need of [facility] care management services.  Patient had previous active outreach by [facility] telephonic coordinators in the past.  Review of chart and MD HPI Recap on 06/10/19 reveal as:   Jaqui Oftedahl is a 68 year old male with past medical history significant for CAD, atrial fibrillation, brain aneurysm status post recent pipeline embolization of LICA,    who presented to [place] with dizziness,hypotension and dark stools. Complicated by current use of Xarelto, Plavix and aspirin. He was found to have a hemoglobin of 13.0, down from 17.1 March 08, 2019. He was FOBT positive.  Patient transferred to [facility] for further management.  GI consulted. (Upper GI bleeding,Acute blood loss anemia/iron deficiency/vitamin B12 def)  Notedpatient is currently listed as an Engaged [facility] patient under HTA plan. Hewill be followed by [facility] in the community, with full case management services.  [facility] care management services are not appropriate at this time.  Will sign off.   For questions, please contact:  Edwena Felty A. Rolinda Impson, BSN, RN-BC [facility] Liaison Cell: [phone]

## 2019-06-11 NOTE — Progress Notes (Signed)
PROGRESS NOTE  LUQMAAN PAKKALA [id number] DOB: 1951-04-20 DOA: 06/08/2019 PCP: Enid Skeens., MD  HPI/Recap of past 24 hours: HPI from Dr [place] Josia Litwiller is a 68 year old male with past medical history significant for CAD, atrial fibrillation, brain aneurysm status post recent pipeline embolization of LICA who presented to [facility] with dizziness, hypotension and dark stools.  Complicated by current use of Xarelto, Plavix and aspirin.  He was found to have a hemoglobin of 13.0, down from 17.1 March 08, 2019.  He was FOBT positive.  Patient transferred to [facility] for further management.  GI consulted.     Today, patient denies any new complaints, had a formed stool last night that was very dark brown.  Denies any bright red blood per rectum, nausea/vomiting, abdominal pain, chest pain, shortness of breath.  Denies any dizziness this morning.   Assessment/Plan: Active Problems:   Chronic atrial fibrillation ([place]): Anticoagulated his chads 2 Vas score equals 3   Arteriosclerotic cardiovascular disease (ASCVD)   Chronic anticoagulation   Essential hypertension   Cerebral aneurysm, nonruptured   UGIB (upper gastrointestinal bleed)   Melena   Erosive gastritis with hemorrhage   Absolute anemia  Upper GI bleeding Likely in the setting of taking Xarelto, aspirin, Plavix Hemoglobin downtrending, baseline around 16 Type and screen done GI on board, s/p endoscopy on 06/09/2019 showed diffuse hemorrhagic gastritis, recommending continuing PPI infusion for 72 hours total, serial hemoglobin and transfuse when less than 8 Neurosurgery recommend continuing aspirin only, d/c Plavix, and continue to hold Xarelto for about a week total Continue IV fluids, s/p Protonix drip, switch to oral Protonix Frequent CBC, monitor closely  Acute blood loss anemia/iron deficiency/vitamin B12 def Anemia panel: Iron 23, TIBC 312, sats 7, Vit B12 284 Status post vitamin B12  intramuscular Feraheme x1 still pending to be given as BP is very soft Transfuse when hemoglobin less than 8 Management per above  Orthostatic hypotension Likely due to above IVF Orthostatic vitals every shift  Hypomagnesemia Replace PRN  Chronic atrial fibrillation Currently rate controlled, had an episode of RVR on 10/4, after his received digoxin and lopressor had an episode of non-sustained brady Continue to hold digoxin, metoprolol, Xarelto  Brain aneurysm status post recent pipeline embolization of LICA on 0000000 Neurosurgery recommends continuing aspirin to complete 3 months, last dose ~06/13/2019       Malnutrition Type:      Malnutrition Characteristics:      Nutrition Interventions:       Estimated body mass index is 21.48 kg/m as calculated from the following:   Height as of this encounter: 6' (1.829 m).   Weight as of this encounter: 71.8 kg.     Code Status: Full  Family Communication: None at bedside  Disposition Plan: To be determined, likely home   Consultants:  GI  Procedures:  EGD on 06/09/19  Antimicrobials:  None  DVT prophylaxis: SCDs   Objective: Vitals:   06/11/19 0550 06/11/19 0623 06/11/19 0809 06/11/19 1212  BP: 100/66 (!) 102/59 (!) 86/62 93/63  Pulse: (!) 111  83 85  Resp: 18  19 19   Temp: 98.1 F (36.7 C)  98.5 F (36.9 C) 97.9 F (36.6 C)  TempSrc: Oral   Oral  SpO2: 100%  99% 100%  Weight:      Height:        Intake/Output Summary (Last 24 hours) at 06/11/2019 1454 Last data filed at 06/11/2019 0600 Gross per 24 hour  Intake 1628.43 ml  Output 400 ml  Net 1228.43 ml   Filed Weights   06/08/19 2058 06/09/19 0425 06/10/19 0503  Weight: 73.2 kg 71.9 kg 71.8 kg    Exam:  General: NAD   Cardiovascular: S1, S2 present  Respiratory: CTAB  Abdomen: Soft, nontender, nondistended, bowel sounds present  Musculoskeletal: No bilateral pedal edema noted  Skin: Normal  Psychiatry: Normal mood    Data Reviewed: CBC: Recent Labs  Lab 06/08/19 1427  06/09/19 1804 06/10/19 0225 06/10/19 1723 06/11/19 0220 06/11/19 0938  WBC 9.0   < > 4.5 4.6 4.8 5.4 4.7  NEUTROABS 6.0  --   --   --   --   --   --   HGB 13.0   < > 8.8* 8.5* 8.8* 8.1* 8.1*  HCT 39.2   < > 25.9* 24.4* 25.7* 24.2* 24.7*  MCV 100.3*   < > 101.2* 101.7* 103.2* 103.0* 104.7*  PLT 244   < > 156 146* 172 151 172   < > = values in this interval not displayed.   Basic Metabolic Panel: Recent Labs  Lab 06/08/19 1427 06/09/19 0416 06/10/19 0225 06/11/19 0220  NA 135 138 137 138  K 4.3 4.8 4.2 5.0  CL 100 106 109 111  CO2 24 24 23  21*  GLUCOSE 124* 112* 87 105*  BUN 42* 34* 24* 24*  CREATININE 1.13 1.13 1.03 1.16  CALCIUM 9.6 8.4* 8.1* 8.2*  MG  --  1.5* 1.8  --   PHOS  --  3.4  --   --    GFR: Estimated Creatinine Clearance: 62 mL/min (by C-G formula based on SCr of 1.16 mg/dL). Liver Function Tests: Recent Labs  Lab 06/08/19 1427 06/09/19 0416  AST 23 17  ALT 15 14  ALKPHOS 65 46  BILITOT 0.5 0.5  PROT 7.0 4.7*  ALBUMIN 3.9 2.7*   No results for input(s): LIPASE, AMYLASE in the last 168 hours. No results for input(s): AMMONIA in the last 168 hours. Coagulation Profile: Recent Labs  Lab 06/08/19 1427  INR 2.1*   Cardiac Enzymes: No results for input(s): CKTOTAL, CKMB, CKMBINDEX, TROPONINI in the last 168 hours. BNP (last 3 results) No results for input(s): PROBNP in the last 8760 hours. HbA1C: No results for input(s): HGBA1C in the last 72 hours. CBG: No results for input(s): GLUCAP in the last 168 hours. Lipid Profile: No results for input(s): CHOL, HDL, LDLCALC, TRIG, CHOLHDL, LDLDIRECT in the last 72 hours. Thyroid Function Tests: Recent Labs    06/09/19 0416  TSH 3.911   Anemia Panel: Recent Labs    06/10/19 1247  VITAMINB12 284  FOLATE 10.1  FERRITIN 25  TIBC 312  IRON 23*   Urine analysis:    Component Value Date/Time   COLORURINE YELLOW 03/08/2019 1139    APPEARANCEUR CLEAR 03/08/2019 1139   LABSPEC 1.016 03/08/2019 1139   PHURINE 6.0 03/08/2019 1139   GLUCOSEU NEGATIVE 03/08/2019 1139   HGBUR NEGATIVE 03/08/2019 1139   BILIRUBINUR NEGATIVE 03/08/2019 1139   KETONESUR NEGATIVE 03/08/2019 1139   PROTEINUR NEGATIVE 03/08/2019 1139   UROBILINOGEN 0.2 09/13/2012 0852   NITRITE NEGATIVE 03/08/2019 1139   LEUKOCYTESUR NEGATIVE 03/08/2019 1139   Sepsis Labs: @LABRCNTIP (procalcitonin:4,lacticidven:4)  ) Recent Results (from the past 240 hour(s))  SARS Coronavirus 2 [facility] order, Performed in [facility] hospital lab) Nasopharyngeal Nasopharyngeal Swab     Status: None   Collection Time: 06/08/19  5:12 PM   Specimen: Nasopharyngeal Swab  Result Value  Ref Range Status   SARS Coronavirus 2 NEGATIVE NEGATIVE Final    Comment: (NOTE) If result is NEGATIVE SARS-CoV-2 target nucleic acids are NOT DETECTED. The SARS-CoV-2 RNA is generally detectable in upper and lower  respiratory specimens during the acute phase of infection. The lowest  concentration of SARS-CoV-2 viral copies this assay can detect is 250  copies / mL. A negative result does not preclude SARS-CoV-2 infection  and should not be used as the sole basis for treatment or other  patient management decisions.  A negative result may occur with  improper specimen collection / handling, submission of specimen other  than nasopharyngeal swab, presence of viral mutation(s) within the  areas targeted by this assay, and inadequate number of viral copies  (<250 copies / mL). A negative result must be combined with clinical  observations, patient history, and epidemiological information. If result is POSITIVE SARS-CoV-2 target nucleic acids are DETECTED. The SARS-CoV-2 RNA is generally detectable in upper and lower  respiratory specimens dur ing the acute phase of infection.  Positive  results are indicative of active infection with SARS-CoV-2.  Clinical  correlation with patient  history and other diagnostic information is  necessary to determine patient infection status.  Positive results do  not rule out bacterial infection or co-infection with other viruses. If result is PRESUMPTIVE POSTIVE SARS-CoV-2 nucleic acids MAY BE PRESENT.   A presumptive positive result was obtained on the submitted specimen  and confirmed on repeat testing.  While 2019 novel coronavirus  (SARS-CoV-2) nucleic acids may be present in the submitted sample  additional confirmatory testing may be necessary for epidemiological  and / or clinical management purposes  to differentiate between  SARS-CoV-2 and other Sarbecovirus currently known to infect humans.  If clinically indicated additional testing with an alternate test  methodology [phone]) is advised. The SARS-CoV-2 RNA is generally  detectable in upper and lower respiratory sp ecimens during the acute  phase of infection. The expected result is Negative. Fact Sheet for Patients:  StrictlyIdeas.no Fact Sheet for Healthcare Providers: BankingDealers.co.za This test is not yet approved or cleared by the [place] FDA and has been authorized for detection and/or diagnosis of SARS-CoV-2 by FDA under an Emergency Use Authorization (EUA).  This EUA will remain in effect (meaning this test can be used) for the duration of the COVID-19 declaration under Section 564(b)(1) of the Act, 21 U.S.C. section 360bbb-3(b)(1), unless the authorization is terminated or revoked sooner. Performed at [facility], [address] [place], [place] [address]       Studies: No results found.  Scheduled Meds: . aspirin  325 mg Oral Daily  . pantoprazole  40 mg Oral BID    Continuous Infusions: . sodium chloride 10 mL/hr at 06/09/19 1555  . sodium chloride 100 mL/hr at 06/11/19 0515  . ferumoxytol    . pantoprozole (PROTONIX) infusion 8 mg/hr (06/10/19 2350)     LOS: 3 days      Alma Friendly, MD [facility]  If 7PM-7AM, please contact night-coverage [email or web address] 06/11/2019, 2:54 PM

## 2019-06-11 NOTE — Care Management Important Message (Signed)
Important Message  Patient Details  Name: Johnathan Bauer MRN: [id number] Date of Birth: [birth year]   Medicare Important Message Given:  Yes     Shelda Altes 06/11/2019, 2:23 PM

## 2019-06-11 NOTE — Progress Notes (Addendum)
Daily Rounding Note  06/11/2019, 10:13 AM  LOS: 3 days   SUBJECTIVE:   Chief complaint:   GI bleed in setting of aspirin, Plavix, Xarelto. Latest stool was formed, very dark brown, last night.  Tolerating solid food.  Remains hypotensive.  However he is not dizzy this morning.  Overall feels well.  No nausea, vomiting, abdominal pain.  OBJECTIVE:         Vital signs in last 24 hours:    Temp:  [97 F (36.1 C)-98.7 F (37.1 C)] 98.5 F (36.9 C) (10/05 0809) Pulse Rate:  [82-111] 83 (10/05 0809) Resp:  [13-25] 19 (10/05 0809) BP: (81-111)/(59-79) 86/62 (10/05 0809) SpO2:  [99 %-100 %] 99 % (10/05 0809) Last BM Date: 06/10/19 Filed Weights   06/08/19 2058 06/09/19 0425 06/10/19 0503  Weight: 73.2 kg 71.9 kg 71.8 kg   General: Pale but otherwise well-appearing.  Alert, comfortable. Heart: Irregularly, irregular, rate control in the 70s. Chest: Clear bilaterally.  No labored breathing or cough. Abdomen: Soft.  Not tender or distended. Extremities: No CCE. Neuro/Psych: Fully alert and oriented.  Not confused.  Moves all 4 limbs, no tremor.  Intake/Output from previous day: 10/04 0701 - 10/05 0700 In: 1628.4 [I.V.:1628.4] Out: 400 [Urine:400]  Intake/Output this shift: No intake/output data recorded.  Lab Results: Recent Labs    06/10/19 0225 06/10/19 1723 06/11/19 0220  WBC 4.6 4.8 5.4  HGB 8.5* 8.8* 8.1*  HCT 24.4* 25.7* 24.2*  PLT 146* 172 151   BMET Recent Labs    06/09/19 0416 06/10/19 0225 06/11/19 0220  NA 138 137 138  K 4.8 4.2 5.0  CL 106 109 111  CO2 24 23 21*  GLUCOSE 112* 87 105*  BUN 34* 24* 24*  CREATININE 1.13 1.03 1.16  CALCIUM 8.4* 8.1* 8.2*   LFT Recent Labs    06/08/19 1427 06/09/19 0416  PROT 7.0 4.7*  ALBUMIN 3.9 2.7*  AST 23 17  ALT 15 14  ALKPHOS 65 46  BILITOT 0.5 0.5   PT/INR Recent Labs    06/08/19 1427  LABPROT 23.6*  INR 2.1*   Hepatitis Panel No  results for input(s): HEPBSAG, HCVAB, HEPAIGM, HEPBIGM in the last 72 hours.  Studies/Results: No results found.   Scheduled Meds: . aspirin  325 mg Oral Daily  . [START ON 06/12/2019] pantoprazole  40 mg Oral BID   Continuous Infusions: . sodium chloride 10 mL/hr at 06/09/19 1555  . sodium chloride 100 mL/hr at 06/11/19 0515  . ferumoxytol    . pantoprozole (PROTONIX) infusion 8 mg/hr (06/10/19 2350)   PRN Meds:.sodium chloride, acetaminophen **OR** acetaminophen, ondansetron **OR** ondansetron (ZOFRAN) IV   ASSESMENT:   *    GI bleed manifesting with melena. 06/09/2019 EGD.  Diffuse, hemorrhagic gastritis.  Patchy duodenal bulb inflammation.  Pathology pending. 72-hour PPI infusion.  *    Anemia.  Macrocytic.   B12, folate okay.  Low iron and iron saturation.  Ferritin 25 (RR 24 -336) Hgb drifting 13 >> 9.8 >> 8.8 >> 8.1.   Received B12 10/4.  Feraheme ordered but has not been given yet.  No transfusions to date.  *    Chronic Plavix, Xarelto, aspirin.  Xarelto and Plavix on hold. Stop date for 3 months of Plavix was planned for 06/13/2019.  Last dose Xarelto 06/08/2019.   *     CVA.  S/p embolization LICA [id number]  *    Chronic atrial fibrillation.  *  Sigmoid colectomy for complicated diverticulitis, colostomy revision 2013.  Colostomy reversal 2014.  Incisional hernia repair 2016. Latest colonoscopy 03/2017, Dr. Owens Loffler, 3 polyps removed.  Biopsies of abnormal appearing anastomotic mucosa.  Internal hemorrhoids.  Polyps were tubular adenomas and sessile serrated type.  Biopsies showed benign colonic mucosa with reactive and mild hyperplastic changes.   PLAN   *    72 hours of Protonix finishes today at 1640 , switch to Protonix 40 mg p.o. twice daily after that.  *   CBC in AM.   Hold Lovenox.  Await biopsies    Azucena Freed  06/11/2019, 10:13 AM Phone [phone]    Attending physician's note   I have taken an interval history, reviewed the chart and  examined the patient. I agree with the Advanced Practitioner's note, impression and recommendations.   UGI bleed d/t hemorrhagic gastritis (resolved). Path pending Microcytic anemia H/O sigmoid colectomy for complicated diverticulitis.  History of colonic polyps 03/2017  Plan: -Protonix 40 mg p.o. once a day. -Can resume Xarelto in 1 week -If indicated, can resume aspirin at discharge. -From previous notes, I believe he will be off Plavix. -Would need colonoscopy as an outpatient -Check CBC 3-5 days after discharge. -Follow-up as per previous note -Follow path. -We will sign off for now. -Discussed with patient's wife.  They will need detailed instructions at discharge.  Carmell Austria, MD [facility] [phone].

## 2019-06-11 NOTE — Plan of Care (Signed)
  Problem: Clinical Measurements: Goal: Respiratory complications will improve Outcome: Progressing Note: Stable on room air.  No s/s of respiratory complications noted.   Problem: Activity: Goal: Risk for activity intolerance will decrease Outcome: Progressing Note: Tolerated ambulating in hall.  A-fib with HR 120s, but patient asymptomatic.

## 2019-06-12 LAB — BASIC METABOLIC PANEL
Anion gap: 7 (ref 5–15)
BUN: 17 mg/dL (ref 8–23)
CO2: 21 mmol/L — ABNORMAL LOW (ref 22–32)
Calcium: 8.2 mg/dL — ABNORMAL LOW (ref 8.9–10.3)
Chloride: 111 mmol/L (ref 98–111)
Creatinine, Ser: 1.03 mg/dL (ref 0.61–1.24)
GFR calc Af Amer: >60 mL/min (ref >60)
GFR calc non Af Amer: >60 mL/min (ref >60)
Glucose, Bld: 93 mg/dL (ref 70–99)
Potassium: 4.4 mmol/L (ref 3.5–5.1)
Sodium: 139 mmol/L (ref 135–145)

## 2019-06-12 LAB — CBC
HCT: 21.4 % — ABNORMAL LOW (ref 39.0–52.0)
HCT: 25.7 % — ABNORMAL LOW (ref 39.0–52.0)
HCT: 25.1 % — ABNORMAL LOW (ref 39.0–52.0)
Hemoglobin: 7.1 g/dL — ABNORMAL LOW (ref 13.0–17.0)
Hemoglobin: 8.9 g/dL — ABNORMAL LOW (ref 13.0–17.0)
Hemoglobin: 8.4 g/dL — ABNORMAL LOW (ref 13.0–17.0)
MCH: 34.1 pg — ABNORMAL HIGH (ref 26.0–34.0)
MCH: 34.5 pg — ABNORMAL HIGH (ref 26.0–34.0)
MCH: 33.1 pg (ref 26.0–34.0)
MCHC: 33.2 g/dL (ref 30.0–36.0)
MCHC: 34.6 g/dL (ref 30.0–36.0)
MCHC: 33.5 g/dL (ref 30.0–36.0)
MCV: 102.9 fL — ABNORMAL HIGH (ref 80.0–100.0)
MCV: 99.6 fL (ref 80.0–100.0)
MCV: 98.8 fL (ref 80.0–100.0)
Platelets: 152 10*3/uL (ref 150–400)
Platelets: 164 10*3/uL (ref 150–400)
Platelets: 177 10*3/uL (ref 150–400)
RBC: 2.08 MIL/uL — ABNORMAL LOW (ref 4.22–5.81)
RBC: 2.58 MIL/uL — ABNORMAL LOW (ref 4.22–5.81)
RBC: 2.54 MIL/uL — ABNORMAL LOW (ref 4.22–5.81)
RDW: 15.1 % (ref 11.5–15.5)
RDW: 15.9 % — ABNORMAL HIGH (ref 11.5–15.5)
RDW: 15.9 % — ABNORMAL HIGH (ref 11.5–15.5)
WBC: 4.6 10*3/uL (ref 4.0–10.5)
WBC: 4.1 10*3/uL (ref 4.0–10.5)
WBC: 4.7 10*3/uL (ref 4.0–10.5)
nRBC: 0 % (ref 0.0–0.2)
nRBC: 0 % (ref 0.0–0.2)
nRBC: 0 % (ref 0.0–0.2)

## 2019-06-12 LAB — SURGICAL PATHOLOGY

## 2019-06-12 LAB — PREPARE RBC (CROSSMATCH)

## 2019-06-12 MED ORDER — SODIUM CHLORIDE 0.9% IV SOLUTION: Freq: Once | INTRAVENOUS | Status: DC

## 2019-06-12 NOTE — Progress Notes (Signed)
Physical Therapy Treatment Patient Details Name: Johnathan Bauer MRN: [id number] DOB: 11/23/1950 Today's Date: 06/12/2019    History of Present Illness 68yo male presenting to [facility] with dizziness, hypotension, dark stools, Hgb 13 and FOBT positive in ED. Transferred to [facility] for likely upper GI bleed and acute blood loss anemia. 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 HR 80-90sbpm at rest. Noted a-fib. Pt reports ambulating entire floor last night. Pt amb 100' with IV pole and HR increased into 160s, dec to 130s with standing rest break. Returned to bed due to tachycardia and being in a-fib. Acute PT to cont to follow.    Follow Up Recommendations  No PT follow up;Supervision for mobility/OOB     Equipment Recommendations  None recommended by PT    Recommendations for Other Services       Precautions / Restrictions Precautions Precautions: Fall Precaution Comments: watch HR Restrictions Weight Bearing Restrictions: No    Mobility  Bed Mobility Overal bed mobility: Modified Independent             General bed mobility comments: no dizziness with transfer to EOB  Transfers Overall transfer level: Needs assistance Equipment used: None Transfers: Sit to/from Stand Sit to Stand: Min guard         General transfer comment: min guard for safety due to history of dizzines upon movement  Ambulation/Gait Ambulation/Gait assistance: Min guard Gait Distance (Feet): 100 Feet Assistive device: IV Pole Gait Pattern/deviations: Step-through pattern;Decreased stride length;Drifts right/left Gait velocity: dec Gait velocity interpretation: 1.31 - 2.62 ft/sec, indicative of limited community ambulator General Gait Details: HR went from 88 to 162bpm, dec to 130s with standing rest break   Stairs             Wheelchair Mobility     Modified Rankin (Stroke Patients Only)       Balance Overall balance assessment: Mild deficits observed, not formally tested                                          Cognition Arousal/Alertness: Awake/alert Behavior During Therapy: WFL for tasks assessed/performed Overall Cognitive Status: Within Functional Limits for tasks assessed                                        Exercises      General Comments General comments (skin integrity, edema, etc.): wife present and expresses anxiety over HR increasing with all movement. and erratically      Pertinent Vitals/Pain Pain Assessment: No/denies pain    Home Living                      Prior Function            PT Goals (current goals can now be found in the care plan section) Progress towards PT goals: Progressing toward goals    Frequency    Min 3X/week      PT Plan Current plan remains appropriate    Co-evaluation              AM-PAC PT "6 Clicks" Mobility   Outcome Measure  Help needed  turning from your back to your side while in a flat bed without using bedrails?: None Help needed moving from lying on your back to sitting on the side of a flat bed without using bedrails?: None Help needed moving to and from a bed to a chair (including a wheelchair)?: A Little Help needed standing up from a chair using your arms (e.g., wheelchair or bedside chair)?: A Little Help needed to walk in hospital room?: A Little Help needed climbing 3-5 steps with a railing? : A Little 6 Click Score: 20    End of Session Equipment Utilized During Treatment: Gait belt Activity Tolerance: Treatment limited secondary to medical complications (Comment)(increased HR to 160) Patient left: in bed;with call bell/phone within reach;with nursing/sitter in room;with family/visitor present Nurse Communication: Mobility status PT Visit Diagnosis: Unsteadiness on feet (R26.81);Muscle weakness  (generalized) (M62.81)     Time: [id number] PT Time Calculation (min) (ACUTE ONLY): 14 min  Charges:  $Gait Training: 8-22 mins                     Kittie Plater, PT, DPT Acute Rehabilitation Services Pager #: [phone] Office #: [phone]    [name] 06/12/2019, 2:01 PM

## 2019-06-12 NOTE — Progress Notes (Signed)
PROGRESS NOTE  KAHSEEM ANASTASIA [id number] DOB: [birth year] DOA: 06/08/2019 PCP: Enid Skeens., MD  HPI/Recap of past 24 hours: HPI from Dr [place] Mareo Brownlow is a 68 year old male with past medical history significant for CAD, atrial fibrillation, brain aneurysm status post recent pipeline embolization of LICA who presented to [place] with dizziness, hypotension and dark stools.  Complicated by current use of Xarelto, Plavix and aspirin.  He was found to have a hemoglobin of 13.0, down from 17.1 March 08, 2019.  He was FOBT positive.  Patient transferred to [facility] for further management.  GI consulted.     Today, patient denies any new complaints.  Has not had any bowel movements in the past 24 hours.  Denies any obvious signs of bleeding, chest pain, shortness of breath, dizziness.  Patient noted to have significant tachycardia with heart rate going into the 130s to 160s upon ambulation.    Assessment/Plan: Active Problems:   Chronic atrial fibrillation ([place]): Anticoagulated his chads 2 Vas score equals 3   Arteriosclerotic cardiovascular disease (ASCVD)   Chronic anticoagulation   Essential hypertension   Cerebral aneurysm, nonruptured   UGIB (upper gastrointestinal bleed)   Melena   Erosive gastritis with hemorrhage   Absolute anemia  Upper GI bleeding Likely in the setting of taking Xarelto, aspirin, Plavix Hemoglobin downtrending, baseline around 16 Plan to transfuse 2 units of PRBC, blood bank with shortage of blood, plan for 1 for now and reevaluate GI on board, s/p endoscopy on 06/09/2019 showed diffuse hemorrhagic gastritis, recommend PPI, serial hemoglobin and transfuse when less than 8 Neurosurgery recommend continuing aspirin only, d/c Plavix, and continue to hold Xarelto for about a week total Continue IV fluids, Protonix Frequent CBC, monitor closely  Acute blood loss anemia/iron deficiency/vitamin B12 def Anemia panel: Iron 23,  TIBC 312, sats 7, Vit B12 284 Status post vitamin B12 intramuscular Feraheme x1 still pending to be given as BP is very soft Transfuse as mentioned above  Orthostatic hypotension Likely due to above, heart rate jumps to the 130s upon ambulation IVF Orthostatic vitals every shift  Hypomagnesemia Replace PRN  Chronic atrial fibrillation Currently rate controlled, except on ambulation.  Had an episode of RVR on 10/4, after his received digoxin and lopressor had an episode of non-sustained brady Continue to hold digoxin, metoprolol, Xarelto  Brain aneurysm status post recent pipeline embolization of LICA on 0000000 Neurosurgery recommends continuing aspirin to complete 3 months, last dose ~06/13/2019          Estimated body mass index is 22.93 kg/m as calculated from the following:   Height as of this encounter: 6' (1.829 m).   Weight as of this encounter: 76.7 kg.     Code Status: Full  Family Communication: None at bedside  Disposition Plan: To be determined, likely home   Consultants:  GI  Procedures:  EGD on 06/09/19  Antimicrobials:  None  DVT prophylaxis: SCDs   Objective: Vitals:   06/12/19 1141 06/12/19 1156 06/12/19 1419 06/12/19 1509  BP: 104/75 97/71  115/83  Pulse: 92 84 81 87  Resp: 14 12 18 18   Temp: (!) 97.2 F (36.2 C) (!) 97.4 F (36.3 C) (!) 96.8 F (36 C) (!) 97.3 F (36.3 C)  TempSrc: Oral Oral Oral Oral  SpO2: 100% 100% 100% 100%  Weight:      Height:        Intake/Output Summary (Last 24 hours) at 06/12/2019 1558 Last data filed at  06/12/2019 1419 Gross per 24 hour  Intake 2624.5 ml  Output 1100 ml  Net 1524.5 ml   Filed Weights   06/09/19 0425 06/10/19 0503 06/12/19 0410  Weight: 71.9 kg 71.8 kg 76.7 kg    Exam:  General: NAD   Cardiovascular: S1, S2 present  Respiratory: CTAB  Abdomen: Soft, nontender, nondistended, bowel sounds present  Musculoskeletal: No bilateral pedal edema noted  Skin: Normal   Psychiatry: Normal mood   Data Reviewed: CBC: Recent Labs  Lab 06/08/19 1427  06/10/19 0225 06/10/19 1723 06/11/19 0220 06/11/19 0938 06/12/19 0313  WBC 9.0   < > 4.6 4.8 5.4 4.7 4.6  NEUTROABS 6.0  --   --   --   --   --   --   HGB 13.0   < > 8.5* 8.8* 8.1* 8.1* 7.1*  HCT 39.2   < > 24.4* 25.7* 24.2* 24.7* 21.4*  MCV 100.3*   < > 101.7* 103.2* 103.0* 104.7* 102.9*  PLT 244   < > 146* 172 151 172 152   < > = values in this interval not displayed.   Basic Metabolic Panel: Recent Labs  Lab 06/08/19 1427 06/09/19 0416 06/10/19 0225 06/11/19 0220 06/12/19 0313  NA 135 138 137 138 139  K 4.3 4.8 4.2 5.0 4.4  CL 100 106 109 111 111  CO2 24 24 23  21* 21*  GLUCOSE 124* 112* 87 105* 93  BUN 42* 34* 24* 24* 17  CREATININE 1.13 1.13 1.03 1.16 1.03  CALCIUM 9.6 8.4* 8.1* 8.2* 8.2*  MG  --  1.5* 1.8  --   --   PHOS  --  3.4  --   --   --    GFR: Estimated Creatinine Clearance: 74.5 mL/min (by C-G formula based on SCr of 1.03 mg/dL). Liver Function Tests: Recent Labs  Lab 06/08/19 1427 06/09/19 0416  AST 23 17  ALT 15 14  ALKPHOS 65 46  BILITOT 0.5 0.5  PROT 7.0 4.7*  ALBUMIN 3.9 2.7*   No results for input(s): LIPASE, AMYLASE in the last 168 hours. No results for input(s): AMMONIA in the last 168 hours. Coagulation Profile: Recent Labs  Lab 06/08/19 1427  INR 2.1*   Cardiac Enzymes: No results for input(s): CKTOTAL, CKMB, CKMBINDEX, TROPONINI in the last 168 hours. BNP (last 3 results) No results for input(s): PROBNP in the last 8760 hours. HbA1C: No results for input(s): HGBA1C in the last 72 hours. CBG: No results for input(s): GLUCAP in the last 168 hours. Lipid Profile: No results for input(s): CHOL, HDL, LDLCALC, TRIG, CHOLHDL, LDLDIRECT in the last 72 hours. Thyroid Function Tests: No results for input(s): TSH, T4TOTAL, FREET4, T3FREE, THYROIDAB in the last 72 hours. Anemia Panel: Recent Labs    06/10/19 1247  VITAMINB12 284  FOLATE 10.1   FERRITIN 25  TIBC 312  IRON 23*   Urine analysis:    Component Value Date/Time   COLORURINE YELLOW 03/08/2019 1139   APPEARANCEUR CLEAR 03/08/2019 1139   LABSPEC 1.016 03/08/2019 1139   PHURINE 6.0 03/08/2019 1139   GLUCOSEU NEGATIVE 03/08/2019 1139   HGBUR NEGATIVE 03/08/2019 1139   BILIRUBINUR NEGATIVE 03/08/2019 1139   KETONESUR NEGATIVE 03/08/2019 1139   PROTEINUR NEGATIVE 03/08/2019 1139   UROBILINOGEN 0.2 09/13/2012 0852   NITRITE NEGATIVE 03/08/2019 1139   LEUKOCYTESUR NEGATIVE 03/08/2019 1139   Sepsis Labs: @LABRCNTIP (procalcitonin:4,lacticidven:4)  ) Recent Results (from the past 240 hour(s))  SARS Coronavirus 2 [facility] order, Performed in [facility] hospital lab)  Nasopharyngeal Nasopharyngeal Swab     Status: None   Collection Time: 06/08/19  5:12 PM   Specimen: Nasopharyngeal Swab  Result Value Ref Range Status   SARS Coronavirus 2 NEGATIVE NEGATIVE Final    Comment: (NOTE) If result is NEGATIVE SARS-CoV-2 target nucleic acids are NOT DETECTED. The SARS-CoV-2 RNA is generally detectable in upper and lower  respiratory specimens during the acute phase of infection. The lowest  concentration of SARS-CoV-2 viral copies this assay can detect is 250  copies / mL. A negative result does not preclude SARS-CoV-2 infection  and should not be used as the sole basis for treatment or other  patient management decisions.  A negative result may occur with  improper specimen collection / handling, submission of specimen other  than nasopharyngeal swab, presence of viral mutation(s) within the  areas targeted by this assay, and inadequate number of viral copies  (<250 copies / mL). A negative result must be combined with clinical  observations, patient history, and epidemiological information. If result is POSITIVE SARS-CoV-2 target nucleic acids are DETECTED. The SARS-CoV-2 RNA is generally detectable in upper and lower  respiratory specimens dur ing the acute phase of  infection.  Positive  results are indicative of active infection with SARS-CoV-2.  Clinical  correlation with patient history and other diagnostic information is  necessary to determine patient infection status.  Positive results do  not rule out bacterial infection or co-infection with other viruses. If result is PRESUMPTIVE POSTIVE SARS-CoV-2 nucleic acids MAY BE PRESENT.   A presumptive positive result was obtained on the submitted specimen  and confirmed on repeat testing.  While 2019 novel coronavirus  (SARS-CoV-2) nucleic acids may be present in the submitted sample  additional confirmatory testing may be necessary for epidemiological  and / or clinical management purposes  to differentiate between  SARS-CoV-2 and other Sarbecovirus currently known to infect humans.  If clinically indicated additional testing with an alternate test  methodology [phone]) is advised. The SARS-CoV-2 RNA is generally  detectable in upper and lower respiratory sp ecimens during the acute  phase of infection. The expected result is Negative. Fact Sheet for Patients:  StrictlyIdeas.no Fact Sheet for Healthcare Providers: BankingDealers.co.za This test is not yet approved or cleared by the [place] FDA and has been authorized for detection and/or diagnosis of SARS-CoV-2 by FDA under an Emergency Use Authorization (EUA).  This EUA will remain in effect (meaning this test can be used) for the duration of the COVID-19 declaration under Section 564(b)(1) of the Act, 21 U.S.C. section 360bbb-3(b)(1), unless the authorization is terminated or revoked sooner. Performed at [facility], [address] [place], [place] [address]       Studies: No results found.  Scheduled Meds: . sodium chloride   Intravenous Once  . sodium chloride   Intravenous Once  . aspirin  325 mg Oral Daily  . pantoprazole  40 mg Oral BID    Continuous Infusions:  . sodium chloride 10 mL/hr at 06/09/19 1555  . sodium chloride 100 mL/hr at 06/12/19 0415  . ferumoxytol       LOS: 4 days     Alma Friendly, MD [facility]  If 7PM-7AM, please contact night-coverage [email or web address] 06/12/2019, 3:58 PM

## 2019-06-12 NOTE — Plan of Care (Signed)
  Problem: Education: Goal: Knowledge of General Education information will improve Description: Including pain rating scale, medication(s)/side effects and non-pharmacologic comfort measures Outcome: Progressing   Problem: Health Behavior/Discharge Planning: Goal: Ability to manage health-related needs will improve Outcome: Progressing   Problem: Clinical Measurements: Goal: Ability to maintain clinical measurements within normal limits will improve Outcome: Progressing Goal: Will remain free from infection Outcome: Progressing Goal: Diagnostic test results will improve Outcome: Progressing Goal: Respiratory complications will improve Outcome: Progressing Goal: Cardiovascular complication will be avoided Outcome: Progressing   Problem: Activity: Goal: Risk for activity intolerance will decrease Outcome: Progressing   Problem: Elimination: Goal: Will not experience complications related to urinary retention Outcome: Progressing   Problem: Safety: Goal: Ability to remain free from injury will improve Outcome: Progressing   Problem: Skin Integrity: Goal: Risk for impaired skin integrity will decrease Outcome: Progressing   Problem: Education: Goal: Ability to identify signs and symptoms of gastrointestinal bleeding will improve Outcome: Progressing   Problem: Bowel/Gastric: Goal: Will show no signs and symptoms of gastrointestinal bleeding Outcome: Progressing   Problem: Fluid Volume: Goal: Will show no signs and symptoms of excessive bleeding Outcome: Progressing   Problem: Clinical Measurements: Goal: Complications related to the disease process, condition or treatment will be avoided or minimized Outcome: Progressing

## 2019-06-12 NOTE — Progress Notes (Signed)
Per blood bank pt is only approved for one unit at this time due to shortage of type O.  Notified md. Will draw [place] after first unit.

## 2019-06-13 LAB — BPAM RBC
Blood Product Expiration Date: 202010212359
Blood Product Expiration Date: 202011022359
ISSUE DATE / TIME: 202010061132
Unit Type and Rh: 9500
Unit Type and Rh: 9500

## 2019-06-13 LAB — BASIC METABOLIC PANEL
Anion gap: 7 (ref 5–15)
BUN: 14 mg/dL (ref 8–23)
CO2: 22 mmol/L (ref 22–32)
Calcium: 8.3 mg/dL — ABNORMAL LOW (ref 8.9–10.3)
Chloride: 109 mmol/L (ref 98–111)
Creatinine, Ser: 0.97 mg/dL (ref 0.61–1.24)
GFR calc Af Amer: >60 mL/min (ref >60)
GFR calc non Af Amer: >60 mL/min (ref >60)
Glucose, Bld: 99 mg/dL (ref 70–99)
Potassium: 4 mmol/L (ref 3.5–5.1)
Sodium: 138 mmol/L (ref 135–145)

## 2019-06-13 LAB — CBC
HCT: 24.8 % — ABNORMAL LOW (ref 39.0–52.0)
HCT: 27.2 % — ABNORMAL LOW (ref 39.0–52.0)
Hemoglobin: 8.4 g/dL — ABNORMAL LOW (ref 13.0–17.0)
Hemoglobin: 9 g/dL — ABNORMAL LOW (ref 13.0–17.0)
MCH: 33.5 pg (ref 26.0–34.0)
MCH: 33.2 pg (ref 26.0–34.0)
MCHC: 33.9 g/dL (ref 30.0–36.0)
MCHC: 33.1 g/dL (ref 30.0–36.0)
MCV: 98.8 fL (ref 80.0–100.0)
MCV: 100.4 fL — ABNORMAL HIGH (ref 80.0–100.0)
Platelets: 155 10*3/uL (ref 150–400)
Platelets: 185 10*3/uL (ref 150–400)
RBC: 2.51 MIL/uL — ABNORMAL LOW (ref 4.22–5.81)
RBC: 2.71 MIL/uL — ABNORMAL LOW (ref 4.22–5.81)
RDW: 15.9 % — ABNORMAL HIGH (ref 11.5–15.5)
RDW: 15.8 % — ABNORMAL HIGH (ref 11.5–15.5)
WBC: 4.1 10*3/uL (ref 4.0–10.5)
WBC: 3.5 10*3/uL — ABNORMAL LOW (ref 4.0–10.5)
nRBC: 0 % (ref 0.0–0.2)
nRBC: 0 % (ref 0.0–0.2)

## 2019-06-13 LAB — TYPE AND SCREEN
ABO/RH(D): O NEG
Antibody Screen: NEGATIVE
Unit division: 0
Unit division: 0

## 2019-06-13 MED ORDER — METOPROLOL TARTRATE 50 MG PO TABS
50.0000 mg | ORAL_TABLET | Freq: Every day | ORAL | Status: DC
  Administered 2019-06-13: 50 mg via ORAL
  Filled 2019-06-13: qty 1

## 2019-06-13 MED ORDER — PANTOPRAZOLE SODIUM 40 MG PO TBEC: 40.0000 mg | DELAYED_RELEASE_TABLET | Freq: Two times a day (BID) | ORAL | 1 refills | Status: DC

## 2019-06-13 MED ORDER — DIGOXIN 250 MCG PO TABS
0.2500 mg | ORAL_TABLET | Freq: Every day | ORAL | Status: DC
  Administered 2019-06-13: 0.25 mg via ORAL
  Filled 2019-06-13: qty 1

## 2019-06-13 MED ORDER — FERROUS SULFATE 325 (65 FE) MG PO TABS: 325.0000 mg | ORAL_TABLET | Freq: Every day | ORAL | 2 refills | Status: DC

## 2019-06-13 NOTE — Plan of Care (Signed)
  Problem: Clinical Measurements: Goal: Diagnostic test results will improve Outcome: Progressing   Problem: Activity: Goal: Risk for activity intolerance will decrease Outcome: Progressing   Problem: Education: Goal: Ability to identify signs and symptoms of gastrointestinal bleeding will improve Outcome: Completed/Met

## 2019-06-13 NOTE — Progress Notes (Signed)
Occupational Therapy Treatment Patient Details Name: Johnathan Bauer MRN: [id number] DOB: [birth year] Today's Date: 06/13/2019    History of present illness 68yo male presenting to [place] with dizziness, hypotension, dark stools, Hgb 13 and FOBT positive in ED. Transferred to [facility] for likely upper GI bleed and acute blood loss anemia. 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 Pt supervision overall for functional mobility and toileting tasks with no AD. HR noted to be 99 at start of session, increasing to 128 after toileting and standing grooming at sink. HR decreased with seated rest break. Noted highest reading to be 166 with functional mobility- RN aware. DC plan remains appropriate. Updated DME recs as pt reports his DME needs are met. Will let OTR know about change in POC.   Follow Up Recommendations  No OT follow up;Supervision - Intermittent    Equipment Recommendations  Other (comment)(pt reports DME needs are met)    Recommendations for Other Services      Precautions / Restrictions Precautions Precautions: Fall Precaution Comments: watch HR Restrictions Weight Bearing Restrictions: No       Mobility Bed Mobility Overal bed mobility: Modified Independent             General bed mobility comments: sitting EOB with NT upon arrival  Transfers Overall transfer level: Modified independent Equipment used: None Transfers: Sit to/from Stand Sit to Stand: Supervision;From elevated surface         General transfer comment: supervision but no physical assist; denies dizziness with movement    Balance Overall balance assessment: Mild deficits observed, not formally tested                                         ADL either performed or assessed with clinical judgement   ADL       Grooming: Wash/dry hands;Wash/dry  face;Standing;Supervision/safety;Oral care;Sitting Grooming Details (indicate cue type and reason): pt HR spiked to 160s with standing grooming; cues to finish task in chair; supervision overall             Lower Body Dressing: Modified independent;Sit to/from stand Lower Body Dressing Details (indicate cue type and reason): can reach feet with figure four Toilet Transfer: Supervision/safety;Ambulation;Regular Glass blower/designer Details (indicate cue type and reason): stood to pee; supervision for safety       Tub/Shower Transfer Details (indicate cue type and reason): education on tub bench transfer;pt stating he doesn't think he needs one of those right now but will get one if he changes his mind Functional mobility during ADLs: Supervision/safety General ADL Comments: supervision for safety; cues to monitor HR and take rest breaks at allow HR to decrease     Vision Baseline Vision/History: Wears glasses Wears Glasses: Reading only Patient Visual Report: No change from baseline     Perception     Praxis      Cognition Arousal/Alertness: Awake/alert Behavior During Therapy: WFL for tasks assessed/performed Overall Cognitive Status: Within Functional Limits for tasks assessed                                          Exercises     Shoulder Instructions  General Comments HR 99 upon arrival at EOB; increased to 128 while ambulating to bathroom; noted HR 166 after standing grooming at sink    Pertinent Vitals/ Pain       Pain Assessment: No/denies pain  Home Living                                          Prior Functioning/Environment              Frequency  Min 2X/week        Progress Toward Goals  OT Goals(current goals can now be found in the care plan section)  Progress towards OT goals: Progressing toward goals  Acute Rehab OT Goals Patient Stated Goal: go home, feel better OT Goal Formulation: With  patient/family Time For Goal Achievement: 06/18/19 Potential to Achieve Goals: Good  Plan Discharge plan remains appropriate    Co-evaluation                 AM-PAC OT "6 Clicks" Daily Activity     Outcome Measure   Help from another person eating meals?: None   Help from another person toileting, which includes using toliet, bedpan, or urinal?: None Help from another person bathing (including washing, rinsing, drying)?: A Little Help from another person to put on and taking off regular upper body clothing?: None Help from another person to put on and taking off regular lower body clothing?: None 6 Click Score: 19    End of Session    OT Visit Diagnosis: Unsteadiness on feet (R26.81);Other abnormalities of gait and mobility (R26.89);Muscle weakness (generalized) (M62.81);Dizziness and giddiness (R42)   Activity Tolerance Patient tolerated treatment well   Patient Left in chair;with call bell/phone within reach;with bed alarm set   Nurse Communication Mobility status;Other (comment)(HR spiked to 166; pt denied dizziness)        Time: 9758-8325 OT Time Calculation (min): 14 min  Charges: OT General Charges $OT Visit: 1 Visit OT Treatments $Self Care/Home Management : 8-22 mins  [place], [place] [facility] [phone] [place] 06/13/2019, 9:22 AM

## 2019-06-13 NOTE — Progress Notes (Signed)
Patient ambulated the length of the hall in [place] with steady gait. No lightheadedness, dizziness, chest pain. BP after ambulation 90/65. Notified Dr. Maylene Roes and no concerns with discharge. Patient instructed to keep a log of blood pressures and notify PCP or cardiology with any symptoms of lightheaded, dizziness or chest pain with or without activity. Patient and wife verbalize understanding.

## 2019-06-13 NOTE — Discharge Summary (Signed)
Physician Discharge Summary  Johnathan Bauer [id number] DOB: [birth year] DOA: 06/08/2019  PCP: Enid Skeens., MD  Admit date: 06/08/2019 Discharge date: 06/13/2019  Admitted From: Home Disposition:  Home  Recommendations for Outpatient Follow-up:  1. Follow up with PCP in 1 week 2. Follow up with GI in 1 week 3. Recommend outpatient colonoscopy 4. Please obtain repeat CBC within 1 week  Discharge Condition: Stable CODE STATUS: Full  Diet recommendation: Heart healthy   Brief/Interim Summary: From H&P by Dr. Roel Cluck: "Johnathan Bauer is a 68 y.o. male with medical history significant of CAD, atrial fibrillation, brain aneurysm status post recent pipeline embolization of LICA, chronic anticoagulation for atrial fibrillation, CKD,    Presented with dizziness and hypotension Has been feeling lightheaded for the past 3 to 4 days he went out to eat and just felt like the world was spinning around him. Worse when he tries to stand up.  He feels like he needs to sit down blood pressure has been low in 80s and 90s.  He has been trying to drink some Glucerna He has had some dark stool close to black the past few days Loose has been somewhat more short of breath when he takes out the garbage he walks up the stairs  Last hemoglobin per records was 17.1 in July He was seen by cardiology who was concerned the patient was having a GI bleeding as he has been on Plavix aspirin 325 and Xarelto 20 mg daily with no PPI on board Patient was supposed to stop his Plavix on Tuesday as it has been 3 months after his stent for aneurysm was placed Check his orthostatics his blood pressure was 108/60 sitting and 90/58 standing He was instructed to go to emergency department He was admitted 03/13/19 for aneurysm stenting by neurosurgery.  Reports in the past he had seen Dr. Ardis Hughs and had a colonoscopy and endoscopy done Denies taking ibuprofen or Aleve No recent meds changes  He has been on Keppra for  about a month after the procedure but has been discontinued"  Interim: GI was consulted and patient underwent upper endoscopy which revealed diffuse hemorrhagic gastritis.  Patient also required 1 unit packed red blood cell transfusion during his hospitalization.  After discussion with neurosurgery, they recommended stopping Plavix and continuing aspirin at present dose.  Discharge Diagnoses:  Active Problems:   Chronic atrial fibrillation ([place]): Anticoagulated his chads 2 Vas score equals 3   Arteriosclerotic cardiovascular disease (ASCVD)   Chronic anticoagulation   Essential hypertension   Cerebral aneurysm, nonruptured   UGIB (upper gastrointestinal bleed)   Melena   Erosive gastritis with hemorrhage   Absolute anemia   Upper GI bleeding Likely in the setting of taking Xarelto, aspirin, Plavix Transfused 1 unit packed red blood cell on 10/6 GI on board, s/p endoscopy on 06/09/2019 showed diffuse hemorrhagic gastritis, recommend PPI, serial hemoglobin and transfuse when less than 8 Neurosurgery recommend continuing aspirin only, d/c Plavix, and continue to hold Xarelto for about a week total Continue Protonix 40mg  BID   Acute blood loss anemia/iron deficiency/vitamin B12 def Anemia panel: Iron 23, TIBC 312, sats 7, Vit B12 284 Status post vitamin B12 IM Feraheme x1.  Prescribed oral ferrous sulfate on discharge Transfuse as mentioned above   Orthostatic hypotension Orthostatic VS yesterday negative  Chronic atrial fibrillation Had an episode of RVR on 10/4, after his received digoxin and lopressor had an episode of non-sustained brady HR this morning in the low 100s. Resume digoxin,  metoprolol Hold Xarelto 1 week   Brain aneurysm status post recent pipeline embolization of LICA on 0000000 Per neurosurgery, okay to stop Plavix, remain on aspirin    Discharge Instructions  Discharge Instructions    Call MD for:  difficulty breathing, headache or visual disturbances    Complete by: As directed    Call MD for:  extreme fatigue   Complete by: As directed    Call MD for:  persistant dizziness or light-headedness   Complete by: As directed    Call MD for:  persistant nausea and vomiting   Complete by: As directed    Call MD for:  severe uncontrolled pain   Complete by: As directed    Call MD for:  temperature >100.4   Complete by: As directed    Diet - low sodium heart healthy   Complete by: As directed    Discharge instructions   Complete by: As directed    You were cared for by a hospitalist during your hospital stay. If you have any questions about your discharge medications or the care you received while you were in the hospital after you are discharged, you can call the unit and ask to speak with the hospitalist on call if the hospitalist that took care of you is not available. Once you are discharged, your primary care physician will handle any further medical issues. Please note that NO REFILLS for any discharge medications will be authorized once you are discharged, as it is imperative that you return to your primary care physician (or establish a relationship with a primary care physician if you do not have one) for your aftercare needs so that they can reassess your need for medications and monitor your lab values.   Discharge instructions   Complete by: As directed    Stop plavix. Continue aspirin. Stop xarelto for 1 week, then resume.   Increase activity slowly   Complete by: As directed      Allergies as of 06/13/2019      Reactions   Beef-derived Products Hives   Has to watch how much beef he eats   Codeine Nausea And Vomiting   Poison Ivy Extract [poison Ivy Extract] Hives      Medication List    STOP taking these medications   clopidogrel 75 MG tablet Commonly known as: PLAVIX   Xarelto 20 MG Tabs tablet Generic drug: rivaroxaban     TAKE these medications   albuterol 108 (90 Base) MCG/ACT inhaler Commonly known as: VENTOLIN  HFA Inhale 2-4 puffs into the lungs every 6 (six) hours as needed for shortness of breath or wheezing.   aspirin EC 325 MG tablet Take 325 mg by mouth daily.   digoxin 0.25 MG tablet Commonly known as: LANOXIN TAKE 1 TABLET (250 MCG TOTAL) BY MOUTH DAILY. What changed:   how much to take  how to take this  when to take this  additional instructions   ferrous sulfate 325 (65 FE) MG tablet Take 1 tablet (325 mg total) by mouth daily.   metoprolol tartrate 50 MG tablet Commonly known as: LOPRESSOR TAKE 1 TABLET BY MOUTH EVERY DAY   nitroGLYCERIN 0.4 MG SL tablet Commonly known as: NITROSTAT Place 1 tablet (0.4 mg total) under the tongue every 5 (five) minutes as needed for chest pain.   pantoprazole 40 MG tablet Commonly known as: PROTONIX Take 1 tablet (40 mg total) by mouth 2 (two) times daily.   sertraline 50 MG tablet Commonly known  as: ZOLOFT Take 50 mg by mouth daily.      Follow-up Information    Slatosky, Marshall Cork., MD.   Specialty: Family Medicine Contact information: [address] [address] [phone]        [name], MD. Schedule an appointment as soon as possible for a visit in 1 week(s).   Specialty: Gastroenterology Contact information: [address] [address] [phone]          Allergies  Allergen Reactions  . Beef-Derived Products Hives    Has to watch how much beef he eats  . Codeine Nausea And Vomiting  . Poison Ivy Extract [Poison Ivy Extract] Hives    Consultations:  GI    Procedures/Studies: No results found.  EGD 10/3 Impression:       - Acute gastritis with hemorrhage. Biopsied.                            Diffuse gastritis withhj oozing of fresh blood.                            antral bxs taken looking for h pylori                           - Acute duodenitis.                           - The examination was otherwise normal.   Discharge Exam: Vitals:   06/12/19 2100  06/13/19 0521  BP: 122/73 122/88  Pulse: 85 80  Resp: 18 20  Temp: 98.7 F (37.1 C) 97.9 F (36.6 C)  SpO2: 100% 100%     General: Pt is alert, awake, not in acute distress Cardiovascular: Irregular rhythm, rate 100s, S1/S2 +, no edema Respiratory: CTA bilaterally, no wheezing, no rhonchi, no respiratory distress, no conversational dyspnea  Abdominal: Soft, NT, ND, bowel sounds + Extremities: no edema, no cyanosis Psych: Normal mood and affect, stable judgement and insight     The results of significant diagnostics from this hospitalization (including imaging, microbiology, ancillary and laboratory) are listed below for reference.     Microbiology: Recent Results (from the past 240 hour(s))  SARS Coronavirus 2 [facility] order, Performed in [facility] hospital lab) Nasopharyngeal Nasopharyngeal Swab     Status: None   Collection Time: 06/08/19  5:12 PM   Specimen: Nasopharyngeal Swab  Result Value Ref Range Status   SARS Coronavirus 2 NEGATIVE NEGATIVE Final    Comment: (NOTE) If result is NEGATIVE SARS-CoV-2 target nucleic acids are NOT DETECTED. The SARS-CoV-2 RNA is generally detectable in upper and lower  respiratory specimens during the acute phase of infection. The lowest  concentration of SARS-CoV-2 viral copies this assay can detect is 250  copies / mL. A negative result does not preclude SARS-CoV-2 infection  and should not be used as the sole basis for treatment or other  patient management decisions.  A negative result may occur with  improper specimen collection / handling, submission of specimen other  than nasopharyngeal swab, presence of viral mutation(s) within the  areas targeted by this assay, and inadequate number of viral copies  (<250 copies / mL). A negative result must be combined with clinical  observations, patient history, and epidemiological information. If result is POSITIVE SARS-CoV-2 target nucleic  acids are DETECTED. The SARS-CoV-2 RNA is  generally detectable in upper and lower  respiratory specimens dur ing the acute phase of infection.  Positive  results are indicative of active infection with SARS-CoV-2.  Clinical  correlation with patient history and other diagnostic information is  necessary to determine patient infection status.  Positive results do  not rule out bacterial infection or co-infection with other viruses. If result is PRESUMPTIVE POSTIVE SARS-CoV-2 nucleic acids MAY BE PRESENT.   A presumptive positive result was obtained on the submitted specimen  and confirmed on repeat testing.  While 2019 novel coronavirus  (SARS-CoV-2) nucleic acids may be present in the submitted sample  additional confirmatory testing may be necessary for epidemiological  and / or clinical management purposes  to differentiate between  SARS-CoV-2 and other Sarbecovirus currently known to infect humans.  If clinically indicated additional testing with an alternate test  methodology [phone]) is advised. The SARS-CoV-2 RNA is generally  detectable in upper and lower respiratory sp ecimens during the acute  phase of infection. The expected result is Negative. Fact Sheet for Patients:  StrictlyIdeas.no Fact Sheet for Healthcare Providers: BankingDealers.co.za This test is not yet approved or cleared by the [place] FDA and has been authorized for detection and/or diagnosis of SARS-CoV-2 by FDA under an Emergency Use Authorization (EUA).  This EUA will remain in effect (meaning this test can be used) for the duration of the COVID-19 declaration under Section 564(b)(1) of the Act, 21 U.S.C. section 360bbb-3(b)(1), unless the authorization is terminated or revoked sooner. Performed at [facility], [address] [place], [place] [address]      Labs: BNP (last 3 results) No results for input(s): BNP in the last 8760 hours. Basic Metabolic Panel: Recent Labs  Lab  06/09/19 0416 06/10/19 0225 06/11/19 0220 06/12/19 0313 06/13/19 0453  NA 138 137 138 139 138  K 4.8 4.2 5.0 4.4 4.0  CL 106 109 111 111 109  CO2 24 23 21* 21* 22  GLUCOSE 112* 87 105* 93 99  BUN 34* 24* 24* 17 14  CREATININE 1.13 1.03 1.16 1.03 0.97  CALCIUM 8.4* 8.1* 8.2* 8.2* 8.3*  MG 1.5* 1.8  --   --   --   PHOS 3.4  --   --   --   --    Liver Function Tests: Recent Labs  Lab 06/08/19 1427 06/09/19 0416  AST 23 17  ALT 15 14  ALKPHOS 65 46  BILITOT 0.5 0.5  PROT 7.0 4.7*  ALBUMIN 3.9 2.7*   No results for input(s): LIPASE, AMYLASE in the last 168 hours. No results for input(s): AMMONIA in the last 168 hours. CBC: Recent Labs  Lab 06/08/19 1427  06/11/19 0938 06/12/19 0313 06/12/19 1619 06/12/19 2218 06/13/19 0453  WBC 9.0   < > 4.7 4.6 4.1 4.7 4.1  NEUTROABS 6.0  --   --   --   --   --   --   HGB 13.0   < > 8.1* 7.1* 8.9* 8.4* 8.4*  HCT 39.2   < > 24.7* 21.4* 25.7* 25.1* 24.8*  MCV 100.3*   < > 104.7* 102.9* 99.6 98.8 98.8  PLT 244   < > 172 152 164 177 155   < > = values in this interval not displayed.   Cardiac Enzymes: No results for input(s): CKTOTAL, CKMB, CKMBINDEX, TROPONINI in the last 168 hours. BNP: Invalid input(s): POCBNP CBG: No results for input(s): GLUCAP in  the last 168 hours. D-Dimer No results for input(s): DDIMER in the last 72 hours. Hgb A1c No results for input(s): HGBA1C in the last 72 hours. Lipid Profile No results for input(s): CHOL, HDL, LDLCALC, TRIG, CHOLHDL, LDLDIRECT in the last 72 hours. Thyroid function studies No results for input(s): TSH, T4TOTAL, T3FREE, THYROIDAB in the last 72 hours.  Invalid input(s): FREET3 Anemia work up Recent Labs    06/10/19 1247  VITAMINB12 284  FOLATE 10.1  FERRITIN 25  TIBC 312  IRON 23*   Urinalysis    Component Value Date/Time   COLORURINE YELLOW 03/08/2019 [place] 03/08/2019 1139   LABSPEC 1.016 03/08/2019 1139   PHURINE 6.0 03/08/2019 1139    GLUCOSEU NEGATIVE 03/08/2019 1139   HGBUR NEGATIVE 03/08/2019 1139   BILIRUBINUR NEGATIVE 03/08/2019 1139   KETONESUR NEGATIVE 03/08/2019 1139   PROTEINUR NEGATIVE 03/08/2019 1139   UROBILINOGEN 0.2 09/13/2012 0852   NITRITE NEGATIVE 03/08/2019 1139   LEUKOCYTESUR NEGATIVE 03/08/2019 1139   Sepsis Labs Invalid input(s): PROCALCITONIN,  WBC,  LACTICIDVEN Microbiology Recent Results (from the past 240 hour(s))  SARS Coronavirus 2 [facility] order, Performed in [facility] hospital lab) Nasopharyngeal Nasopharyngeal Swab     Status: None   Collection Time: 06/08/19  5:12 PM   Specimen: Nasopharyngeal Swab  Result Value Ref Range Status   SARS Coronavirus 2 NEGATIVE NEGATIVE Final    Comment: (NOTE) If result is NEGATIVE SARS-CoV-2 target nucleic acids are NOT DETECTED. The SARS-CoV-2 RNA is generally detectable in upper and lower  respiratory specimens during the acute phase of infection. The lowest  concentration of SARS-CoV-2 viral copies this assay can detect is 250  copies / mL. A negative result does not preclude SARS-CoV-2 infection  and should not be used as the sole basis for treatment or other  patient management decisions.  A negative result may occur with  improper specimen collection / handling, submission of specimen other  than nasopharyngeal swab, presence of viral mutation(s) within the  areas targeted by this assay, and inadequate number of viral copies  (<250 copies / mL). A negative result must be combined with clinical  observations, patient history, and epidemiological information. If result is POSITIVE SARS-CoV-2 target nucleic acids are DETECTED. The SARS-CoV-2 RNA is generally detectable in upper and lower  respiratory specimens dur ing the acute phase of infection.  Positive  results are indicative of active infection with SARS-CoV-2.  Clinical  correlation with patient history and other diagnostic information is  necessary to determine patient infection  status.  Positive results do  not rule out bacterial infection or co-infection with other viruses. If result is PRESUMPTIVE POSTIVE SARS-CoV-2 nucleic acids MAY BE PRESENT.   A presumptive positive result was obtained on the submitted specimen  and confirmed on repeat testing.  While 2019 novel coronavirus  (SARS-CoV-2) nucleic acids may be present in the submitted sample  additional confirmatory testing may be necessary for epidemiological  and / or clinical management purposes  to differentiate between  SARS-CoV-2 and other Sarbecovirus currently known to infect humans.  If clinically indicated additional testing with an alternate test  methodology [phone]) is advised. The SARS-CoV-2 RNA is generally  detectable in upper and lower respiratory sp ecimens during the acute  phase of infection. The expected result is Negative. Fact Sheet for Patients:  StrictlyIdeas.no Fact Sheet for Healthcare Providers: BankingDealers.co.za This test is not yet approved or cleared by the [place] FDA and has been authorized for detection and/or  diagnosis of SARS-CoV-2 by FDA under an Emergency Use Authorization (EUA).  This EUA will remain in effect (meaning this test can be used) for the duration of the COVID-19 declaration under Section 564(b)(1) of the Act, 21 U.S.C. section 360bbb-3(b)(1), unless the authorization is terminated or revoked sooner. Performed at [facility], [address] [place], [place] [address]      Patient was seen and examined on the day of discharge and was found to be in stable condition. Time coordinating discharge: 40 minutes including assessment and coordination of care, as well as examination of the patient.   SIGNED:  Dessa Phi, DO [facility] 06/13/2019, 10:42 AM

## 2019-06-14 ENCOUNTER — Telehealth: Payer: Self-pay | Admitting: Gastroenterology

## 2019-06-14 ENCOUNTER — Telehealth: Payer: Self-pay | Admitting: Cardiology

## 2019-06-14 NOTE — Telephone Encounter (Signed)
Spoke with pt and he is aware. 

## 2019-06-14 NOTE — Telephone Encounter (Signed)
Patient was in the hospital at [facility] last week and he wanted you to know.  Gastro Dr approved him to start back on the Xarelto on the 10th of this month and he was off Plavix but still taking 325 aspirin. Does he need to follow up? They also added Protonix.  His stomach was bleeding and aspirin ate lining of stomach.Johnathan Bauer

## 2019-06-14 NOTE — Telephone Encounter (Signed)
Dr. Carlean Purl pt calling wanting to know when he should resume blood thinner. Procedure note states pt to continue to hold blood thinner. Please advise.

## 2019-06-14 NOTE — Telephone Encounter (Signed)
Resume xarelto on 10/10 unless more signs of bleeding before then  Let him/wife know that biospies were benign and no infection  He has f/u Amy later this month

## 2019-06-14 NOTE — Telephone Encounter (Signed)
Pt had procedure with Dr. Carlean Purl at the hospital on 06/09/19. He wants to know when he can resume his blood thinner, he is a bit confused. Pls call him at [phone].

## 2019-06-15 NOTE — Telephone Encounter (Signed)
Cut down aspirin to 81 mg coated aspirin daily.  Check with Dr. Agustin Cree about her Plavix next week.

## 2019-06-15 NOTE — Telephone Encounter (Signed)
Left message for patient to return call.

## 2019-06-18 NOTE — Telephone Encounter (Signed)
Aspirin dose patient is on is still 325 mg daily.

## 2019-06-18 NOTE — Progress Notes (Signed)
See phone note

## 2019-06-18 NOTE — Telephone Encounter (Signed)
Called patient's wife and to follow up on this she reports that the patient is off plavix and taking xarelto 20 mg daily again and aspirin. Dr. Agustin Cree aware and will review in the morning. He has advise he continue current management until the morning. Patient wife aware and aware we will contact her tomorrow with recommendations.

## 2019-06-20 NOTE — Telephone Encounter (Signed)
Attempted to contact patient to inform him of Dr. Wendy Poet recommendation, no answer and voicemail box full. Will continue efforts.

## 2019-06-20 NOTE — Telephone Encounter (Signed)
From discharge summary: GI on board, s/p endoscopy on 06/09/2019 showed diffuse hemorrhagic gastritis, recommendPPI,serial hemoglobin and transfuse when less than 8 Neurosurgery recommend continuing aspirin only, d/c Plavix, and continue to hold Xarelto for about a week total Continue Protonix 40mg  BID    So, continue ASA and Xarelto at thepresnt dose,  PPI

## 2019-06-21 DIAGNOSIS — K2961 Other gastritis with bleeding: Secondary | ICD-10-CM | POA: Diagnosis not present

## 2019-06-22 ENCOUNTER — Encounter: Payer: Self-pay | Admitting: Cardiology

## 2019-06-22 ENCOUNTER — Telehealth: Payer: Self-pay | Admitting: *Deleted

## 2019-06-22 ENCOUNTER — Ambulatory Visit (INDEPENDENT_AMBULATORY_CARE_PROVIDER_SITE_OTHER): Payer: PPO | Admitting: Cardiology

## 2019-06-22 ENCOUNTER — Other Ambulatory Visit: Payer: Self-pay

## 2019-06-22 VITALS — BP 92/60 | HR 72 | Ht 72.0 in | Wt 168.0 lb

## 2019-06-22 DIAGNOSIS — Z5181 Encounter for therapeutic drug level monitoring: Secondary | ICD-10-CM

## 2019-06-22 DIAGNOSIS — Z7901 Long term (current) use of anticoagulants: Secondary | ICD-10-CM | POA: Diagnosis not present

## 2019-06-22 DIAGNOSIS — I482 Chronic atrial fibrillation, unspecified: Secondary | ICD-10-CM | POA: Diagnosis not present

## 2019-06-22 DIAGNOSIS — I10 Essential (primary) hypertension: Secondary | ICD-10-CM | POA: Diagnosis not present

## 2019-06-22 DIAGNOSIS — I2510 Atherosclerotic heart disease of native coronary artery without angina pectoris: Secondary | ICD-10-CM | POA: Diagnosis not present

## 2019-06-22 DIAGNOSIS — Z8673 Personal history of transient ischemic attack (TIA), and cerebral infarction without residual deficits: Secondary | ICD-10-CM

## 2019-06-22 DIAGNOSIS — F1010 Alcohol abuse, uncomplicated: Secondary | ICD-10-CM

## 2019-06-22 DIAGNOSIS — K922 Gastrointestinal hemorrhage, unspecified: Secondary | ICD-10-CM | POA: Diagnosis not present

## 2019-06-22 DIAGNOSIS — Z79899 Other long term (current) drug therapy: Secondary | ICD-10-CM

## 2019-06-22 NOTE — Telephone Encounter (Signed)
Patient informed of critical labs and told to hold digoxin until he discuss with Dr. Harriet Masson on Monday. Note routed to Dr. Harriet Masson to advise?

## 2019-06-22 NOTE — Telephone Encounter (Signed)
Pl have pt hold dig and talk to dr tobb on Monday. If he has any significant symptoms, even nausea to go tot ER

## 2019-06-22 NOTE — Progress Notes (Signed)
Cardiology Office Note:    Date:  06/22/2019   ID:  Johnathan Bauer, DOB 1951-03-30, MRN [id number]  PCP:  Enid Skeens., MD  Cardiologist:  No primary care provider on file.  Electrophysiologist:  None   Referring MD: Enid Skeens., MD   Chief Complaint  Patient presents with  . Follow-up    History of Present Illness:    Johnathan Bauer is a 68 y.o. 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  Patient usually follows with Dr. Agustin Cree.  The patient last saw Laurann Montana, NP on June 08, 2019 and last visit concluded with patient pain walk to the ED due to dizziness.  During his visit he mentioned that he has been experiencing dark stools as well as low blood pressure at home.  In the ED he was worked up in the ED he was found to have a drop in his hemoglobin he was admitted to the [facility].  During his admission to the hospital he had an upper endoscopy which showed evidence of Acute gastritis with hemorrhage.and Acute duodenitis.  His Plavix was stopped at discharge and that he continues to take aspirin 325 mg and Xarelto 20 mg.  In the interim since his hospital discharge she states that he has been feeling better.  His dizziness is resolving.  His blood pressure seems to be improving.  Home he is averaging between 90s to [id number] on his systolic blood pressure.  Of note his wife Johnathan Bauer was on the phone during our encounter.   Past Medical History:  Diagnosis Date  . Arthritis    "touch in my right thumb" (12/31/2014)  . Chronic atrial fibrillation (HCC)    did not like coumadin  . Chronic kidney disease 09/2012  . Coronary artery disease    s/p PCI Sept 2013 with DES OM  . Diverticulitis with perforation   . Dysrhythmia    A-fib  . Pneumonia 09/2012  . Stroke [facility]) 04/2016    Past Surgical History:  Procedure Laterality Date  .  BIOPSY  06/09/2019   Procedure: BIOPSY;  Surgeon: [name], MD;  Location: [facility] ENDOSCOPY;  Service: Endoscopy;;  . COLECTOMY WITH COLOSTOMY CREATION/HARTMANN PROCEDURE  2013  . COLONOSCOPY    . COLOSTOMY REVISION  09/02/2012   Procedure: COLON RESECTION SIGMOID;  Surgeon: Serafina Mitchell, MD;  Location: [facility] OR;  Service: Vascular;  Laterality: N/A;  Sigmoid resection with End Colostomy.  . COLOSTOMY TAKEDOWN N/A 06/06/2013   Procedure: LAPAROSCOPIC OSTOMY TAKEDOWN AND ANASTOMOSIS ;  Surgeon: Ralene Ok, MD;  Location: [place];  Service: General;  Laterality: N/A;  . CORONARY ANGIOPLASTY WITH STENT PLACEMENT  06/2012   "1"  . ESOPHAGOGASTRODUODENOSCOPY (EGD) WITH PROPOFOL N/A 06/09/2019   Procedure: ESOPHAGOGASTRODUODENOSCOPY (EGD) WITH PROPOFOL;  Surgeon: [name], MD;  Location: [place];  Service: Endoscopy;  Laterality: N/A;  . INCISIONAL HERNIA REPAIR  12/31/2014   w/mesh  . INCISIONAL HERNIA REPAIR N/A 12/31/2014   Procedure: OPEN INCISIONAL HERNIA REPAIR WITH MESH (TAR PROCEDURE);  Surgeon: Ralene Ok, MD;  Location: [place];  Service: General;  Laterality: N/A;  . INSERTION OF MESH N/A 12/31/2014   Procedure: INSERTION OF MESH;  Surgeon: Ralene Ok, MD;  Location: [place];  Service: General;  Laterality: N/A;  . IR 3D INDEPENDENT WKST  01/18/2019  . IR ANGIO INTRA EXTRACRAN SEL INTERNAL CAROTID  BILAT MOD SED  01/18/2019  . IR ANGIO INTRA EXTRACRAN SEL INTERNAL CAROTID UNI L MOD SED  03/13/2019  . IR ANGIOGRAM EXTREMITY LEFT  01/18/2019  . IR ANGIOGRAM FOLLOW UP STUDY  03/13/2019  . IR TRANSCATH/EMBOLIZ  03/13/2019  . LAPAROTOMY  09/02/2012   Procedure: EXPLORATORY LAPAROTOMY;  Surgeon: Serafina Mitchell, MD;  Location: [facility] OR;  Service: Vascular;  Laterality: N/A;  Exploratory Laparotomy with Sigmoid resection with end colocstomy.  Marland Kitchen RADIOLOGY WITH ANESTHESIA N/A 03/13/2019   Procedure: [employer];  Surgeon: Consuella Lose, MD;  Location: [place];  Service: Radiology;   Laterality: N/A;  . TONSILLECTOMY      Current Medications: Current Meds  Medication Sig  . albuterol (VENTOLIN HFA) 108 (90 Base) MCG/ACT inhaler Inhale 2-4 puffs into the lungs every 6 (six) hours as needed for shortness of breath or wheezing.  Marland Kitchen aspirin EC 325 MG tablet Take 325 mg by mouth daily.   . digoxin (LANOXIN) 0.25 MG tablet TAKE 1 TABLET (250 MCG TOTAL) BY MOUTH DAILY. (Patient taking differently: Take 0.25 mg by mouth daily. )  . ferrous sulfate 325 (65 FE) MG tablet Take 1 tablet (325 mg total) by mouth daily.  . metoprolol tartrate (LOPRESSOR) 50 MG tablet TAKE 1 TABLET BY MOUTH EVERY DAY (Patient taking differently: Take 50 mg by mouth daily. )  . nitroGLYCERIN (NITROSTAT) 0.4 MG SL tablet Place 1 tablet (0.4 mg total) under the tongue every 5 (five) minutes as needed for chest pain.  . pantoprazole (PROTONIX) 40 MG tablet Take 1 tablet (40 mg total) by mouth 2 (two) times daily.  . sertraline (ZOLOFT) 50 MG tablet Take 50 mg by mouth daily.     Allergies:   Beef-derived products, Codeine, and Poison ivy extract [poison ivy extract]   Social History   Socioeconomic History  . Marital status: Married    Spouse name: Not on file  . Number of children: 2  . Years of education: Not on file  . Highest education level: Not on file  Occupational History  . Occupation: retired  Scientific laboratory technician  . Financial resource strain: Not on file  . Food insecurity    Worry: Not on file    Inability: Not on file  . Transportation needs    Medical: Not on file    Non-medical: Not on file  Tobacco Use  . Smoking status: Former Smoker    Packs/day: 0.10    Years: 45.00    Pack years: 4.50    Types: Cigarettes    Quit date: 06/08/2019    Years since quitting: 0.0  . Smokeless tobacco: Never Used  Substance and Sexual Activity  . Alcohol use: No  . Drug use: No  . Sexual activity: Yes  Lifestyle  . Physical activity    Days per week: Not on file    Minutes per session: Not  on file  . Stress: Not on file  Relationships  . Social Herbalist on phone: Not on file    Gets together: Not on file    Attends religious service: Not on file    Active member of club or organization: Not on file    Attends meetings of clubs or organizations: Not on file    Relationship status: Not on file  Other Topics Concern  . 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 There is no history of Colon cancer.  ROS:   Review of Systems  Constitution: Negative for decreased appetite, fever and weight gain.  HENT: Negative for congestion, ear discharge, hoarse voice and sore throat.   Eyes: Negative for discharge, redness, vision loss in right eye and visual halos.  Cardiovascular: Negative for chest pain, dyspnea on exertion, leg swelling, orthopnea and palpitations.  Respiratory: Negative for cough, hemoptysis, shortness of breath and snoring.   Endocrine: Negative for heat intolerance and polyphagia.  Hematologic/Lymphatic: Negative for bleeding problem. Does not bruise/bleed easily.  Skin: Negative for flushing, nail changes, rash and suspicious lesions.  Musculoskeletal: Negative for arthritis, joint pain, muscle cramps, myalgias, neck pain and stiffness.  Gastrointestinal: Negative for abdominal pain, bowel incontinence, diarrhea and excessive appetite.  Genitourinary: Negative for decreased libido, genital sores and incomplete emptying.  Neurological: Negative for brief paralysis, focal weakness, headaches and loss of balance.  Psychiatric/Behavioral: Negative for altered mental status, depression and suicidal ideas.  Allergic/Immunologic: Negative for HIV  exposure and persistent infections.    EKGs/Labs/Other Studies Reviewed:    The following studies were reviewed today:   EKG:  None  TTE  Study Conclusions - Left ventricle: The cavity size was normal. Systolic function was   normal. The estimated ejection fraction was in the range of 55%   to 60%. Wall motion was normal; there were no regional wall   motion abnormalities. - Mitral valve: There was mild regurgitation. - Left atrium: The atrium was severely dilated. - Right atrium: The atrium was moderately dilated. - Tricuspid valve: There was moderate regurgitation.  Recent Labs: 06/09/2019: ALT 14; TSH 3.911 06/10/2019: Magnesium 1.8 06/13/2019: BUN 14; Creatinine, Ser 0.97; Hemoglobin 9.0; Platelets 185; Potassium 4.0; Sodium 138  Recent Lipid Panel    Component Value Date/Time   CHOL 152 06/23/2018 1605   TRIG 83 06/23/2018 1605   HDL 65 06/23/2018 1605   CHOLHDL 2.3 06/23/2018 1605   LDLCALC 70 06/23/2018 1605    Physical Exam:    VS:  BP 92/60 (BP Location: Left Arm, Patient Position: Sitting, Cuff Size: Normal)   Pulse 72   Ht 6' (1.829 m)   Wt 168 lb (76.2 kg)   SpO2 94%   BMI 22.78 kg/m     Wt Readings from Last 3 Encounters:  06/22/19 168 lb (76.2 kg)  06/13/19 168 lb 6.4 oz (76.4 kg)  06/08/19 159 lb (72.1 kg)    GEN: Well nourished, well developed in no acute distress HEENT: Normal NECK: No JVD; No carotid bruits LYMPHATICS: No lymphadenopathy CARDIAC: S1S2 noted,RRR, no murmurs, rubs, gallops RESPIRATORY:  Clear to auscultation without rales, wheezing or rhonchi  ABDOMEN: Soft, non-tender, non-distended, +bowel sounds, no guarding. EXTREMITIES: No edema, No cyanosis, no clubbing MUSCULOSKELETAL:  No edema; No deformity  SKIN: Warm and dry NEUROLOGIC:  Alert and oriented x 3, non-focal PSYCHIATRIC:  Normal affect, good insight  ASSESSMENT:    1. Chronic atrial fibrillation ([place]): Anticoagulated his chads 2 Vas score equals 3   2. UGIB (upper  gastrointestinal bleed)   3. History of stroke   4. Encounter for monitoring digoxin therapy   5. Arteriosclerotic cardiovascular disease (ASCVD)   6. Coronary artery disease involving native coronary artery of native heart without angina pectoris   7. Essential  hypertension   8. Chronic anticoagulation   9. Alcohol abuse    PLAN:    1.  Patient continue his Xarelto 20 mg daily as well as his aspirin 325 mg daily.  He was started on protonix 40 mg twice daily, I do agree with this.   2 For now we will continue his lopressor at current dose. He will take his blood pressure daily. He was asked to bring his blood pressure recordings to his next appointment.   3.  Blood work will be performed today.  Includes CBC, BMP, digoxin level.  4. The patient was did inquire about taking vitamin B12, I did advised patient that this is a good ideas especially in the setting of his alcohol use.  The patient is in agreement with the above plan. The patient left the office in stable condition.  The patient will follow up in 1 month with Dr. Agustin Cree.   Medication Adjustments/Labs and Tests Ordered: Current medicines are reviewed at length with the patient today.  Concerns regarding medicines are outlined above.  Orders Placed This Encounter  Procedures  . Basic Metabolic Panel (BMET)  . CBC  . Digoxin level   No orders of the defined types were placed in this encounter.   Patient Instructions  Medication Instructions:  Your physician recommends that you continue on your current medications as directed. Please refer to the Current Medication list given to you today.  *If you need a refill on your cardiac medications before your next appointment, please call your pharmacy*  Lab Work: Your physician recommends that you return for lab work today: digoxin level, CBC, BMP.   If you have labs (blood work) drawn today and your tests are completely normal, you will receive your results only by: Marland Kitchen  MyChart Message (if you have MyChart) OR . A paper copy in the mail If you have any lab test that is abnormal or we need to change your treatment, we will call you to review the results.  Testing/Procedures: None  Follow-Up: At [facility], you and your health needs are our priority.  As part of our continuing mission to provide you with exceptional heart care, we have created designated Provider Care Teams.  These Care Teams include your primary Cardiologist (physician) and Advanced Practice Providers (APPs -  Physician Assistants and Nurse Practitioners) who all work together to provide you with the care you need, when you need it.  Your next appointment:   1 month  The format for your next appointment:   In Person  Provider:   Jenne Campus, MD       Adopting a Healthy Lifestyle.  Know what a healthy weight is for you (roughly BMI <25) and aim to maintain this   Aim for 7+ servings of fruits and vegetables daily   65-80+ fluid ounces of water or unsweet tea for healthy kidneys   Limit to max 1 drink of alcohol per day; avoid smoking/tobacco   Limit animal fats in diet for cholesterol and heart health - choose grass fed whenever available   Avoid highly processed foods, and foods high in saturated/trans fats   Aim for low stress - take time to unwind and care for your mental health   Aim for 150 min of moderate intensity exercise weekly for heart health, and weights twice weekly for bone health   Aim for 7-9 hours of sleep daily   When it comes to diets, agreement about the perfect plan isnt easy to  find, even among the experts. Experts at the [place] developed an idea known as the Healthy Eating Plate. Just imagine a plate divided into logical, healthy portions.   The emphasis is on diet quality:   Load up on vegetables and fruits - one-half of your plate: Aim for color and variety, and remember that potatoes dont count.   Go for  whole grains - one-quarter of your plate: Whole wheat, barley, wheat berries, quinoa, oats, brown rice, and foods made with them. If you want pasta, go with whole wheat pasta.   Protein power - one-quarter of your plate: Fish, chicken, beans, and nuts are all healthy, versatile protein sources. Limit red meat.   The diet, however, does go beyond the plate, offering a few other suggestions.   Use healthy plant oils, such as olive, canola, soy, corn, sunflower and peanut. Check the labels, and avoid partially hydrogenated oil, which have unhealthy trans fats.   If youre thirsty, drink water. Coffee and tea are good in moderation, but skip sugary drinks and limit milk and dairy products to one or two daily servings.   The type of carbohydrate in the diet is more important than the amount. Some sources of carbohydrates, such as vegetables, fruits, whole grains, and beans-are healthier than others.   Finally, stay active  Signed, Berniece Salines, DO  06/22/2019 6:01 PM    [facility]

## 2019-06-22 NOTE — Telephone Encounter (Signed)
Hi Dr. Agustin Cree.    I did see you patient Johnathan Bauer in the office today. He will be following up with you. He is on digoxin for his chronic afib. Recent GI bleeding and he was treated at [facility]. His quick follow up was for his recent hospitalization. Lab work today including dig level with has a call back for 2.7. He has been ask to hold his digoxin. He has a planned follow up with you on 07/20/2019. I wanted you to know.

## 2019-06-22 NOTE — Telephone Encounter (Signed)
[place] Glad with [facility] called with critical lab result for patient's digoxin level of 2.7. Please advise. Thanks!

## 2019-06-22 NOTE — Patient Instructions (Signed)
Medication Instructions:  Your physician recommends that you continue on your current medications as directed. Please refer to the Current Medication list given to you today.  *If you need a refill on your cardiac medications before your next appointment, please call your pharmacy*  Lab Work: Your physician recommends that you return for lab work today: digoxin level, CBC, BMP.   If you have labs (blood work) drawn today and your tests are completely normal, you will receive your results only by: Marland Kitchen MyChart Message (if you have MyChart) OR . A paper copy in the mail If you have any lab test that is abnormal or we need to change your treatment, we will call you to review the results.  Testing/Procedures: None  Follow-Up: At [facility], you and your health needs are our priority.  As part of our continuing mission to provide you with exceptional heart care, we have created designated Provider Care Teams.  These Care Teams include your primary Cardiologist (physician) and Advanced Practice Providers (APPs -  Physician Assistants and Nurse Practitioners) who all work together to provide you with the care you need, when you need it.  Your next appointment:   1 month  The format for your next appointment:   In Person  Provider:   Jenne Campus, MD

## 2019-06-23 LAB — BASIC METABOLIC PANEL
BUN/Creatinine Ratio: 10 (ref 10–24)
BUN: 12 mg/dL (ref 8–27)
CO2: 21 mmol/L (ref 20–29)
Calcium: 9 mg/dL (ref 8.6–10.2)
Chloride: 105 mmol/L (ref 96–106)
Creatinine, Ser: 1.26 mg/dL (ref 0.76–1.27)
GFR calc Af Amer: 67 mL/min/{1.73_m2} (ref >59)
GFR calc non Af Amer: 58 mL/min/{1.73_m2} — ABNORMAL LOW (ref >59)
Glucose: 107 mg/dL — ABNORMAL HIGH (ref 65–99)
Potassium: 4.9 mmol/L (ref 3.5–5.2)
Sodium: 139 mmol/L (ref 134–144)

## 2019-06-23 LAB — CBC
Hematocrit: 26.4 % — ABNORMAL LOW (ref 37.5–51.0)
Hemoglobin: 8.2 g/dL — ABNORMAL LOW (ref 13.0–17.7)
MCH: 30.4 pg (ref 26.6–33.0)
MCHC: 31.1 g/dL — ABNORMAL LOW (ref 31.5–35.7)
MCV: 98 fL — ABNORMAL HIGH (ref 79–97)
Platelets: 351 10*3/uL (ref 150–450)
RBC: 2.7 x10E6/uL — CL (ref 4.14–5.80)
RDW: 14.9 % (ref 11.6–15.4)
WBC: 4.2 10*3/uL (ref 3.4–10.8)

## 2019-06-23 LAB — DIGOXIN LEVEL: Digoxin, Serum: 2.7 ng/mL (ref 0.5–0.9)

## 2019-06-25 ENCOUNTER — Telehealth: Payer: Self-pay

## 2019-06-25 ENCOUNTER — Ambulatory Visit: Payer: PPO | Admitting: Physician Assistant

## 2019-06-25 ENCOUNTER — Encounter: Payer: Self-pay | Admitting: Physician Assistant

## 2019-06-25 VITALS — BP 100/64 | HR 64 | Temp 97.6°F | Ht 72.0 in | Wt 166.6 lb

## 2019-06-25 DIAGNOSIS — D509 Iron deficiency anemia, unspecified: Secondary | ICD-10-CM

## 2019-06-25 DIAGNOSIS — Z1159 Encounter for screening for other viral diseases: Secondary | ICD-10-CM | POA: Diagnosis not present

## 2019-06-25 DIAGNOSIS — Z1211 Encounter for screening for malignant neoplasm of colon: Secondary | ICD-10-CM | POA: Diagnosis not present

## 2019-06-25 DIAGNOSIS — Z86010 Personal history of colonic polyps: Secondary | ICD-10-CM

## 2019-06-25 DIAGNOSIS — K2971 Gastritis, unspecified, with bleeding: Secondary | ICD-10-CM | POA: Diagnosis not present

## 2019-06-25 MED ORDER — DIGOXIN 125 MCG PO TABS: ORAL_TABLET | ORAL | 1 refills | Status: DC

## 2019-06-25 MED ORDER — PEG 3350-KCL-NA BICARB-NACL 420 G PO SOLR: 4000.0000 mL | Freq: Once | ORAL | 0 refills | Status: AC

## 2019-06-25 NOTE — Progress Notes (Signed)
I agree with the above note, plan 

## 2019-06-25 NOTE — Progress Notes (Signed)
Subjective:    Patient ID: Johnathan Bauer, male    DOB: 16-Oct-1950, 68 y.o.   MRN: [id number]  HPI Johnathan Bauer is a pleasant 68 year old white male, established with Dr. Ardis Hughs who comes in today for follow-up after recent hospitalization 10/2 through 06/13/2019.  He had presented with GI bleeding in setting of full dose aspirin, Xarelto and Plavix. He underwent EGD by Dr. Carlean Purl which showed a diffuse hemorrhagic gastritis.  He was started on twice daily PPI. 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 After conversation with neuro surgery, he was given the okay to stop Plavix as plan was to keep him on this for 3 months.  They recommended continuing aspirin 325 mg daily.  Xarelto was held while he was in the hospital and he resumed to that last week. Patient did require transfusion of 1 unit of packed RBCs.  On discharge, hemoglobin was 9 hematocrit of 27.2, MCV of 100.  He was also found to be iron deficient with serum iron of 23 TIBC of 312 and iron saturation of 7, and ferritin of 25. He was to receive Feraheme while he was inpatient but this did not get done.  He was discharged on oral iron. Mention was made during the hospitalization of patient having follow-up colonoscopy because of the iron deficiency etc.  His last colonoscopy was in July 2018 per Dr. Ardis Hughs with removal of 3 sessile polyps 3 to 5 mm in size and he had some bulging at his sigmoid anastomosis which was also biopsied.  Biopsy showed 2 tubular adenomas, one sessile serrated polyp and biopsies from the anastomosis were benign. Patient says he is feeling okay since discharge from the hospital, he denies any dizziness or lightheadedness.  His wife says he has still been somewhat weak and tired.  He did continue to have blackish/dark  stool for a couple of days after discharge from the hospital but says his stools are brown and normal-appearing now.  He has no complaints of abdominal discomfort, appetite has been fine. Most recent labs done 06/22/2019 show hemoglobin of 8.2 which is a bit lower than when he was discharged from the hospital.  Review of Systems Pertinent positive and negative review of systems were noted in the above HPI section.  All other review of systems was otherwise negative.  Outpatient Encounter Medications as of 06/25/2019  Medication Sig  . albuterol (VENTOLIN HFA) 108 (90 Base) MCG/ACT inhaler Inhale 2-4 puffs into the lungs every 6 (six) hours as needed for shortness of breath or wheezing.  Marland Kitchen aspirin EC 325 MG tablet Take 325 mg by mouth daily.   . Cyanocobalamin (VITAMIN B-12 PO) Take 1 tablet by mouth daily.  . ferrous sulfate 325 (65 FE) MG tablet Take 1 tablet (325 mg total) by mouth daily.  . Levomefolate Glucosamine (METHYLFOLATE PO) Take 1 tablet by mouth daily.  . metoprolol tartrate (LOPRESSOR) 50 MG tablet TAKE 1 TABLET BY MOUTH EVERY DAY (Patient taking differently: Take 50 mg by mouth daily. )  . nitroGLYCERIN (NITROSTAT) 0.4 MG SL tablet Place 1 tablet (0.4 mg total) under the tongue every 5 (five) minutes as needed for chest pain.  . pantoprazole (PROTONIX) 40 MG tablet Take 1 tablet (40 mg total) by mouth 2 (two) times daily.  . rivaroxaban (XARELTO) 20 MG TABS tablet Take 20 mg  by mouth daily with supper.  . sertraline (ZOLOFT) 50 MG tablet Take 50 mg by mouth daily.  . digoxin (LANOXIN) 0.25 MG tablet TAKE 1 TABLET (250 MCG TOTAL) BY MOUTH DAILY. (Patient not taking: Reported on 06/25/2019)  . polyethylene glycol-electrolytes (NULYTELY/GOLYTELY) 420 g solution Take 4,000 mLs by mouth once for 1 dose.   No facility-administered encounter medications on file as of 06/25/2019.    Allergies  Allergen Reactions  . Beef-Derived Products Hives    Has to watch how much beef he eats  .  Codeine Nausea And Vomiting  . Poison Ivy Extract [Poison Ivy Extract] Hives   Patient Active Problem List   Diagnosis Date Noted  . Absolute anemia   . Melena   . Erosive gastritis with hemorrhage   . UGIB (upper gastrointestinal bleed) 06/08/2019  . Cerebral aneurysm, nonruptured 03/13/2019  . Vision changes 01/10/2019  . Stroke-like symptoms 01/10/2019  . History of stroke 01/10/2019  . Blurry vision, bilateral 01/10/2019  . Dyspnea on exertion 07/08/2017  . History of colonic polyps 11/30/2016  . Chronic anticoagulation 11/30/2016  . Alcohol abuse 05/25/2016  . 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 Coronary artery disease involving native coronary artery of native heart without angina pectoris 05/20/2015  . Essential hypertension 05/20/2015  . S/P hernia repair 12/31/2014  . Respiratory failure, post-operative ([place]) 09/07/2012  . NSVT, 7 beat run 1/02 09/07/2012  . Chronic atrial fibrillation ([place]): Anticoagulated his chads 2 Vas score equals 3 09/03/2012  . 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 Perforated sigmoid colon ([place]) 09/03/2012  . Diverticulitis of sigmoid colon 09/02/2012   Social History   Socioeconomic History  . Marital status: Married    Spouse name: Not on file  . Number of children: 2  . Years of education: Not on file  . Highest education level: Not on file  Occupational History  . Occupation: retired  Scientific laboratory technician  . Financial resource strain: Not on file  . Food insecurity    Worry: Not on file    Inability: Not on file  . Transportation needs    Medical: Not on file    Non-medical: Not on file  Tobacco Use  . Smoking status: Former Smoker    Packs/day: 0.10    Years: 45.00    Pack years: 4.50    Types: Cigarettes    Quit date: 06/08/2019    Years since quitting: 0.0  . Smokeless tobacco: Never Used  Substance and Sexual Activity  . Alcohol use: No  .  Drug use: No  . Sexual activity: Yes  Lifestyle  . Physical activity    Days per week: Not on file    Minutes per session: Not on file  . Stress: Not on file  Relationships  . Social Herbalist on phone: Not on file    Gets together: Not on file    Attends religious service: Not on file    Active member of club or organization: Not on file    Attends meetings of clubs or organizations: Not on file    Relationship status: Not on file  . Intimate partner violence    Fear of current or ex partner: Not on file    Emotionally abused: Not on file    Physically abused: Not on file    Forced sexual activity: Not on file  Other Topics Concern  . 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 Bareford family history includes Lymphoma in his father; Prostate cancer in his paternal grandfather.      Objective:    Vitals:   06/25/19 1401  BP: 100/64  Pulse: 64  Temp: 97.6 F (36.4 C)    Physical Exam Well-developed well-nourished older white male in no acute distress.  Accompanied by his wife height, Weight,166 BMI 22.6  HEENT; nontraumatic normocephalic, EOMI, PER R LA, sclera anicteric. Oropharynx; not examined/mask/Covid Neck; supple, no JVD Cardiovascular; regular rate and rhythm with S1-S2, no murmur rub or gallop Pulmonary; Clear bilaterally Abdomen; soft, nontender, nondistended, no palpable mass or hepatosplenomegaly, bowel sounds are active Rectal; not done today Skin; benign exam, no jaundice rash or appreciable lesions Extremities; no clubbing cyanosis or edema skin warm and dry Neuro/Psych; alert and oriented x4, grossly nonfocal mood and affect appropriate       Assessment & Plan:   #70 68 year old white male with recent hospitalization for GI bleeding in setting of  aspirin Plavix and Xarelto and found to have hemorrhagic gastritis at EGD  #2 anemia/and new iron deficiency-secondary to acute and probably more chronic GI blood loss  #3 status post pipeline embolization of left carotid artery aneurysm July 2020-now off Plavix, remains on aspirin 325 daily #4 history of atrial fibrillation-on Xarelto #5  Coronary artery disease status post prior stent #6 history of diverticulosis and perforated diverticulitis 2014 #6 history of adenomatous and sessile serrated polyps last colonoscopy July 2018  Plan; Will get patient scheduled for Feraheme infusions x2 to replace iron stores, and hopefully this will help build up his blood counts quicker. Will repeat CBC at time of each Feraheme infusion Continue Protonix 40 mg p.o. twice daily x1 month then decrease to 1 p.o. every morning Patient will be scheduled for colonoscopy with Dr. Ardis Hughs.  Procedure was discussed in detail with the patient including indications risks and benefits and he is agreeable to proceed.  This will intentionally be scheduled at the end of November to allow him a bit more time to recover blood counts.  We will need to hold Xarelto for 24 hours prior to procedure, we will communicate with his cardiologist Dr. Lovena Le to assure this is reasonable for this  patient. Would leave on aspirin 325 mg daily, peri-procedure  Mackenzie Lia S Earla Charlie PA-C 06/25/2019   Cc: Enid Skeens., MD

## 2019-06-25 NOTE — Patient Instructions (Addendum)
If you are age 68 or older, your body mass index should be between 23-30. Your Body mass index is 22.6 kg/m. If this is out of the aforementioned range listed, please consider follow up with your Primary Care Provider.  If you are age 59 or younger, your body mass index should be between 19-25. Your Body mass index is 22.6 kg/m. If this is out of the aformentioned range listed, please consider follow up with your Primary Care Provider.   You have been scheduled for a colonoscopy. Please follow written instructions given to you at your visit today.  Please pick up your prep supplies at the pharmacy within the next 1-3 days. If you use inhalers (even only as needed), please bring them with you on the day of your procedure. Your physician has requested that you go to www.startemmi.com and enter the access code given to you at your visit today. This web site gives a general overview about your procedure. However, you should still follow specific instructions given to you by our office regarding your preparation for the procedure.  We have sent the following medications to your pharmacy for you to pick up at your convenience: Golytely  You will be contacted by our office prior to your procedure for directions on holding your Xarelto.  If you do not hear from our office 1 week prior to your scheduled procedure, please call [phone] to discuss.    Continue Protonix 40 mg twice daily for one month, then decrease to one every morning.  The nurse will contact you regarding Iron Infusions.  Due to recent COVID-19 restrictions implemented by [employer] and state authorities and in an effort to keep both patients and staff as safe as possible, [facility] requires COVID-19 testing prior to any scheduled endoscopic procedure. The testing center is located at [address] [place], [place] [address] in the [facility] [employer]  suite.  Your  appointment has been scheduled for 08/06/19 at 8 am.   Please bring your insurance cards to this appointment. You will require your COVID screen 2 business days prior to your endoscopic procedure.  You are not required to quarantine after your screening.  You will only receive a phone call with the results if it is POSITIVE.  If you do not receive a call the day before your procedure you should begin your prep, if ordered, and you should report to the endo center for your procedure at your designated appointment arrival time ( one hour prior to the procedure time). There is no cost to you for the screening on the day of the swab.  [facility] will file with your insurance company for the testing.    You may receive an automated phone call prior to your procedure or have a message in your MyChart that you have an appointment for a BP/15 at the [facility], please disregard this message.  Your testing will be at the [place] [place] location.   If you are leaving [facility] travel [place] on [address], turn left onto [address], turn night onto [address] at the 1st stop light turn right, pass the [employer] on your right and proceed to [place] (white building).    Thank you for choosing me and [facility].   Amy Esterwood, PA-C

## 2019-06-25 NOTE — Telephone Encounter (Signed)
[facility] Pre-operative Risk Assessment     Request for surgical clearance:     Endoscopy Procedure  What type of surgery is being performed?     08/08/19   When is this surgery scheduled?     Colonoscopy  What type of clearance is required ?   Pharmacy  Are there any medications that need to be held prior to surgery and how long? [facility] TWO DAYS PRIOR  Practice name and name of physician performing surgery?      [facility]/Dr. Ardis Hughs  What is your office phone and fax number?      Phone- [phone]  Fax- [phone] Attn: Peter Congo, RMA Anesthesia type (None, local, MAC, general) ?       MAC Attn: Dr. Agustin Cree

## 2019-06-25 NOTE — Addendum Note (Signed)
Addended by: Ashok Norris on: 06/25/2019 04:28 PM   Modules accepted: Orders

## 2019-06-25 NOTE — Telephone Encounter (Signed)
Left message for patient to return call.

## 2019-06-25 NOTE — Telephone Encounter (Signed)
Patient called back informed him per Dr. Agustin Cree to follow current regiumen with xarelto and aspirin. Also patient informed to decrease digoxin to 0.125 mg daily per Dr. Agustin Cree. Patient verbally understood. No further questions.

## 2019-06-26 MED ORDER — PANTOPRAZOLE SODIUM 40 MG PO TBEC: 40.0000 mg | DELAYED_RELEASE_TABLET | Freq: Two times a day (BID) | ORAL | 1 refills | Status: DC

## 2019-06-26 NOTE — Telephone Encounter (Signed)
It is ok to proceed from my point of view, I recommend to stop Xarelto 24h before

## 2019-06-26 NOTE — Telephone Encounter (Signed)
Spoke with patient today.  Advised him to [place] one day per his cardiologist. Patient verbalized understanding.

## 2019-06-26 NOTE — Telephone Encounter (Signed)
Ok, that is probably safe enough

## 2019-06-26 NOTE — Addendum Note (Signed)
Addended by: Candie Mile on: 06/26/2019 03:22 PM   Modules accepted: Orders

## 2019-06-28 ENCOUNTER — Other Ambulatory Visit: Payer: Self-pay | Admitting: Cardiology

## 2019-07-03 ENCOUNTER — Telehealth: Payer: Self-pay | Admitting: Physician Assistant

## 2019-07-04 ENCOUNTER — Other Ambulatory Visit: Payer: Self-pay

## 2019-07-04 DIAGNOSIS — K2971 Gastritis, unspecified, with bleeding: Secondary | ICD-10-CM

## 2019-07-04 DIAGNOSIS — D509 Iron deficiency anemia, unspecified: Secondary | ICD-10-CM

## 2019-07-04 DIAGNOSIS — K922 Gastrointestinal hemorrhage, unspecified: Secondary | ICD-10-CM

## 2019-07-04 NOTE — Telephone Encounter (Signed)
Left a message to call back. Feraheme infusion is scheduled for 07/11/19 at 8:00 am. Patient Johnathan Bauer at the [facility] building. Insurance has not approved this yet.  He is to get his CBC on the day of infusion.

## 2019-07-04 NOTE — Telephone Encounter (Signed)
Pt returned your call.  

## 2019-07-04 NOTE — Telephone Encounter (Signed)
Patient is advised of the infusion date for Feraheme at the [facility] on 07/11/19 at 8:00 am. Confirmed coverage by insurance. Order for CBC same day confirmed.

## 2019-07-06 ENCOUNTER — Other Ambulatory Visit: Payer: Self-pay | Admitting: Cardiology

## 2019-07-10 DIAGNOSIS — Z20828 Contact with and (suspected) exposure to other viral communicable diseases: Secondary | ICD-10-CM | POA: Diagnosis not present

## 2019-07-10 DIAGNOSIS — J209 Acute bronchitis, unspecified: Secondary | ICD-10-CM | POA: Diagnosis not present

## 2019-07-11 ENCOUNTER — Ambulatory Visit (HOSPITAL_COMMUNITY)
Admission: RE | Admit: 2019-07-11 | Discharge: 2019-07-11 | Disposition: A | Discharge status: Home / Self Care | Payer: PPO | Source: Ambulatory Visit | Attending: Internal Medicine | Admitting: Internal Medicine

## 2019-07-11 ENCOUNTER — Other Ambulatory Visit: Payer: Self-pay

## 2019-07-11 DIAGNOSIS — D509 Iron deficiency anemia, unspecified: Secondary | ICD-10-CM | POA: Diagnosis not present

## 2019-07-11 DIAGNOSIS — K922 Gastrointestinal hemorrhage, unspecified: Secondary | ICD-10-CM

## 2019-07-11 LAB — CBC WITH DIFFERENTIAL/PLATELET
Abs Immature Granulocytes: 0.01 10*3/uL (ref 0.00–0.07)
Basophils Absolute: 0 10*3/uL (ref 0.0–0.1)
Basophils Relative: 1 %
Eosinophils Absolute: 0.2 10*3/uL (ref 0.0–0.5)
Eosinophils Relative: 5 %
HCT: 37.4 % — ABNORMAL LOW (ref 39.0–52.0)
Hemoglobin: 10.9 g/dL — ABNORMAL LOW (ref 13.0–17.0)
Immature Granulocytes: 0 %
Lymphocytes Relative: 29 %
Lymphs Abs: 1.3 10*3/uL (ref 0.7–4.0)
MCH: 28.2 pg (ref 26.0–34.0)
MCHC: 29.1 g/dL — ABNORMAL LOW (ref 30.0–36.0)
MCV: 96.9 fL (ref 80.0–100.0)
Monocytes Absolute: 0.7 10*3/uL (ref 0.1–1.0)
Monocytes Relative: 16 %
Neutro Abs: 2.3 10*3/uL (ref 1.7–7.7)
Neutrophils Relative %: 49 %
Platelets: 258 10*3/uL (ref 150–400)
RBC: 3.86 MIL/uL — ABNORMAL LOW (ref 4.22–5.81)
RDW: 15.8 % — ABNORMAL HIGH (ref 11.5–15.5)
WBC: 4.6 10*3/uL (ref 4.0–10.5)
nRBC: 0 % (ref 0.0–0.2)

## 2019-07-11 MED ORDER — SODIUM CHLORIDE 0.9 % IV SOLN
INTRAVENOUS | Status: DC | PRN
  Administered 2019-07-11: 09:00:00 250 mL via INTRAVENOUS

## 2019-07-11 MED ORDER — SODIUM CHLORIDE 0.9 % IV SOLN
510.0000 mg | INTRAVENOUS | Status: DC
  Administered 2019-07-11: 510 mg via INTRAVENOUS
  Filled 2019-07-11: qty 17

## 2019-07-11 NOTE — Progress Notes (Signed)
                   Patient Care Center Note   Diagnosis: Iron Deficiency Anemia   Provider: Alfredia Ferguson., PA-C   Procedure: IV Feraheme   Note: Per order, labs drawn (hgb). Received IV Feraheme via PIV. Tolerated well. Observed 30 minutes post-transfusion. Vitals stable. Discharge instructions given. Alert, orientated and ambulatory at discharge.  Otho Bellows, RN

## 2019-07-11 NOTE — Discharge Instructions (Signed)

## 2019-07-20 ENCOUNTER — Ambulatory Visit (INDEPENDENT_AMBULATORY_CARE_PROVIDER_SITE_OTHER): Payer: PPO | Admitting: Cardiology

## 2019-07-20 ENCOUNTER — Encounter: Payer: Self-pay | Admitting: Cardiology

## 2019-07-20 ENCOUNTER — Other Ambulatory Visit: Payer: Self-pay

## 2019-07-20 VITALS — BP 140/86 | HR 87 | Ht 72.0 in | Wt 167.8 lb

## 2019-07-20 DIAGNOSIS — I2510 Atherosclerotic heart disease of native coronary artery without angina pectoris: Secondary | ICD-10-CM

## 2019-07-20 DIAGNOSIS — K922 Gastrointestinal hemorrhage, unspecified: Secondary | ICD-10-CM | POA: Diagnosis not present

## 2019-07-20 DIAGNOSIS — I671 Cerebral aneurysm, nonruptured: Secondary | ICD-10-CM | POA: Diagnosis not present

## 2019-07-20 DIAGNOSIS — I6990 Unspecified sequelae of unspecified cerebrovascular disease: Secondary | ICD-10-CM

## 2019-07-20 DIAGNOSIS — I482 Chronic atrial fibrillation, unspecified: Secondary | ICD-10-CM | POA: Diagnosis not present

## 2019-07-20 NOTE — Progress Notes (Signed)
Cardiology Office Note:    Date:  07/20/2019   ID:  Johnathan Bauer, DOB 1951/06/10, MRN [id number]  PCP:  Enid Skeens., MD  Cardiologist:  Jenne Campus, MD    Referring MD: Enid Skeens., MD   Chief Complaint  Patient presents with  . 1 month follow up  Doing better  History of Present Illness:    Johnathan Bauer is a 68 y.o. male   hx of hx of CAD, CVA, brain aneurysm status post pipeline embolization of the LICA( stenting), chronic atrial fibrillation on anticoagulation.  Recently he ended up going to hospital because of severe anemia that required blood transfusion also iron B12 shots.  Since that time he is gradually getting better but still complain of being tired exhausted.  Denies having a palpitations no cardiac complaints.  We had a long discussion about what happened to him and why he was on triple therapy all questions were answered to his satisfaction.  Past Medical History:  Diagnosis Date  . Arthritis    "touch in my right thumb" (12/31/2014)  . Chronic atrial fibrillation (HCC)    did not like coumadin  . Chronic kidney disease 09/2012  . Coronary artery disease    s/p PCI Sept 2013 with DES OM  . Diverticulitis with perforation   . Dysrhythmia    A-fib  . GI bleed   . Pneumonia 09/2012  . Stroke [facility]) 04/2016    Past Surgical History:  Procedure Laterality Date  . BIOPSY  06/09/2019   Procedure: BIOPSY;  Surgeon: [name], MD;  Location: [facility] ENDOSCOPY;  Service: Endoscopy;;  . COLECTOMY WITH COLOSTOMY CREATION/HARTMANN PROCEDURE  2013  . COLONOSCOPY    . COLOSTOMY REVISION  09/02/2012   Procedure: COLON RESECTION SIGMOID;  Surgeon: Serafina Mitchell, MD;  Location: [facility] OR;  Service: Vascular;  Laterality: N/A;  Sigmoid resection with End Colostomy.  . COLOSTOMY TAKEDOWN N/A 06/06/2013   Procedure: LAPAROSCOPIC OSTOMY TAKEDOWN AND ANASTOMOSIS ;  Surgeon: Ralene Ok, MD;  Location: [place];  Service: General;  Laterality: N/A;  . CORONARY  ANGIOPLASTY WITH STENT PLACEMENT  06/2012   "1"  . ESOPHAGOGASTRODUODENOSCOPY (EGD) WITH PROPOFOL N/A 06/09/2019   Procedure: ESOPHAGOGASTRODUODENOSCOPY (EGD) WITH PROPOFOL;  Surgeon: [name], MD;  Location: [place];  Service: Endoscopy;  Laterality: N/A;  . INCISIONAL HERNIA REPAIR  12/31/2014   w/mesh  . INCISIONAL HERNIA REPAIR N/A 12/31/2014   Procedure: OPEN INCISIONAL HERNIA REPAIR WITH MESH (TAR PROCEDURE);  Surgeon: Ralene Ok, MD;  Location: [place];  Service: General;  Laterality: N/A;  . INSERTION OF MESH N/A 12/31/2014   Procedure: INSERTION OF MESH;  Surgeon: Ralene Ok, MD;  Location: [place];  Service: General;  Laterality: N/A;  . IR 3D INDEPENDENT WKST  01/18/2019  . IR ANGIO INTRA EXTRACRAN SEL INTERNAL CAROTID BILAT MOD SED  01/18/2019  . IR ANGIO INTRA EXTRACRAN SEL INTERNAL CAROTID UNI L MOD SED  03/13/2019  . IR ANGIOGRAM EXTREMITY LEFT  01/18/2019  . IR ANGIOGRAM FOLLOW UP STUDY  03/13/2019  . IR TRANSCATH/EMBOLIZ  03/13/2019  . LAPAROTOMY  09/02/2012   Procedure: EXPLORATORY LAPAROTOMY;  Surgeon: Serafina Mitchell, MD;  Location: [facility] OR;  Service: Vascular;  Laterality: N/A;  Exploratory Laparotomy with Sigmoid resection with end colocstomy.  Marland Kitchen RADIOLOGY WITH ANESTHESIA N/A 03/13/2019   Procedure: Pipeline-[employer];  Surgeon: Consuella Lose, MD;  Location: [place];  Service: Radiology;  Laterality: N/A;  . TONSILLECTOMY  Current Medications: Current Meds  Medication Sig  . albuterol (VENTOLIN HFA) 108 (90 Base) MCG/ACT inhaler Inhale 2-4 puffs into the lungs every 6 (six) hours as needed for shortness of breath or wheezing.  Marland Kitchen aspirin EC 325 MG tablet Take 325 mg by mouth daily.   . Cyanocobalamin (VITAMIN B-12 PO) Take 1 tablet by mouth daily.  . digoxin (LANOXIN) 0.125 MG tablet TAKE 1 TABLET (125 MCG TOTAL) BY MOUTH DAILY.  . ferrous sulfate 325 (65 FE) MG tablet Take 1 tablet (325 mg total) by mouth daily.  . Levomefolate Glucosamine (METHYLFOLATE  PO) Take 1 tablet by mouth daily.  . metoprolol tartrate (LOPRESSOR) 50 MG tablet TAKE 1 TABLET BY MOUTH EVERY DAY  . nitroGLYCERIN (NITROSTAT) 0.4 MG SL tablet Place 1 tablet (0.4 mg total) under the tongue every 5 (five) minutes as needed for chest pain.  . pantoprazole (PROTONIX) 40 MG tablet Take 1 tablet (40 mg total) by mouth 2 (two) times daily. (Patient taking differently: Take 40 mg by mouth daily. )  . sertraline (ZOLOFT) 50 MG tablet Take 50 mg by mouth daily.  Alveda Reasons 20 MG TABS tablet TAKE 1 TABLET (20 MG TOTAL) BY MOUTH DAILY WITH EVENING MEAL.     Allergies:   Beef-derived products, Codeine, and Poison ivy extract [poison ivy extract]   Social History   Socioeconomic History  . Marital status: Married    Spouse name: Not on file  . Number of children: 2  . Years of education: Not on file  . Highest education level: Not on file  Occupational History  . Occupation: retired  Scientific laboratory technician  . Financial resource strain: Not on file  . Food insecurity    Worry: Not on file    Inability: Not on file  . Transportation needs    Medical: Not on file    Non-medical: Not on file  Tobacco Use  . Smoking status: Former Smoker    Packs/day: 0.10    Years: 45.00    Pack years: 4.50    Types: Cigarettes    Quit date: 06/08/2019    Years since quitting: 0.1  . Smokeless tobacco: Never Used  Substance and Sexual Activity  . Alcohol use: No  . Drug use: No  . Sexual activity: Yes  Lifestyle  . Physical activity    Days per week: Not on file    Minutes per session: Not on file  . Stress: Not on file  Relationships  . Social Herbalist on phone: Not on file    Gets together: Not on file    Attends religious service: Not on file    Active member of club or organization: Not on file    Attends meetings of clubs or organizations: Not on file    Relationship status: Not on file  Other Topics Concern  . 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 There is no history of Colon cancer. ROS:   Please see the history of present illness.    All 14 point review of systems negative except as described per history of present illness  EKGs/Labs/Other Studies Reviewed:  Recent Labs: 06/09/2019: ALT 14; TSH 3.911 06/10/2019: Magnesium 1.8 06/22/2019: BUN 12; Creatinine, Ser 1.26; Potassium 4.9; Sodium 139 07/11/2019: Hemoglobin 10.9; Platelets 258  Recent Lipid Panel    Component Value Date/Time   CHOL 152 06/23/2018 1605   TRIG 83 06/23/2018 1605   HDL 65 06/23/2018 1605   CHOLHDL 2.3 06/23/2018 1605   [place] 70 06/23/2018 1605    Physical Exam:    VS:  BP 140/86   Pulse 87   Ht 6' (1.829 m)   Wt 167 lb 12.8 oz (76.1 kg)   SpO2 98%   BMI 22.76 kg/m     Wt Readings from Last 3 Encounters:  07/20/19 167 lb 12.8 oz (76.1 kg)  06/25/19 166 lb 9.6 oz (75.6 kg)  06/22/19 168 lb (76.2 kg)     GEN:  Well nourished, well developed in no acute distress HEENT: Normal NECK: No JVD; No carotid bruits LYMPHATICS: No lymphadenopathy CARDIAC: Irregularly irregular, no murmurs, no rubs, no gallops RESPIRATORY:  Clear to auscultation without rales, wheezing or rhonchi  ABDOMEN: Soft, non-tender, non-distended MUSCULOSKELETAL:  No edema; No deformity  SKIN: Warm and dry LOWER EXTREMITIES: no swelling NEUROLOGIC:  Alert and oriented x 3 PSYCHIATRIC:  Normal affect   ASSESSMENT:    1. Coronary artery disease involving native coronary artery of native heart without angina pectoris   2. Chronic atrial fibrillation ([place]): Anticoagulated his chads 2 Vas score equals 3   3. Cerebral aneurysm, nonruptured   4. UGIB (upper gastrointestinal  bleed)   5. 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 Coronary disease stable on appropriate medications which I will continue 2. Permanent atrial fibrillation.  Anticoagulated with chads 2 vascular equals 4 which I will continue. 3. History of cerebral aneurysm status post stent implantation.  On aspirin as well as Xarelto. 4. History of upper GI bleed.  Proton pump inhibitors on board.  Hemoglobin is increasing latest numbers 10.2..  He scheduled to see his GI specialist again. 5. Weakness and fatigue.  I will ask him to have an echocardiogram to assess left ventricle ejection fraction again. 6. I did review quite extensive records about him.   Medication Adjustments/Labs and Tests Ordered: Current medicines are reviewed at length with the patient today.  Concerns regarding medicines are outlined above.  No orders of the defined types were placed in this encounter.  Medication changes: No orders of the defined types were placed in this encounter.   Signed, Park Liter, MD, [facility] 07/20/2019 9:49 AM    [place]

## 2019-07-20 NOTE — Patient Instructions (Signed)
Medication Instructions:  Your physician recommends that you continue on your current medications as directed. Please refer to the Current Medication list given to you today.  *If you need a refill on your cardiac medications before your next appointment, please call your pharmacy*  Lab Work: None.  If you have labs (blood work) drawn today and your tests are completely normal, you will receive your results only by: . MyChart Message (if you have MyChart) OR . A paper copy in the mail If you have any lab test that is abnormal or we need to change your treatment, we will call you to review the results.  Testing/Procedures: Your physician has requested that you have an echocardiogram. Echocardiography is a painless test that uses sound waves to create images of your heart. It provides your doctor with information about the size and shape of your heart and how well your heart's chambers and valves are working. This procedure takes approximately one hour. There are no restrictions for this procedure.    Follow-Up: At [facility], you and your health needs are our priority.  As part of our continuing mission to provide you with exceptional heart care, we have created designated Provider Care Teams.  These Care Teams include your primary Cardiologist (physician) and Advanced Practice Providers (APPs -  Physician Assistants and Nurse Practitioners) who all work together to provide you with the care you need, when you need it.  Your next appointment:   3 month(s)  The format for your next appointment:   In Person  Provider:   Robert Krasowski, MD  Other Instructions   Echocardiogram An echocardiogram is a procedure that uses painless sound waves (ultrasound) to produce an image of the heart. Images from an echocardiogram can provide important information about:  Signs of coronary artery disease (CAD).  Aneurysm detection. An aneurysm is a weak or damaged part of an artery wall that  bulges out from the normal force of blood pumping through the body.  Heart size and shape. Changes in the size or shape of the heart can be associated with certain conditions, including heart failure, aneurysm, and CAD.  Heart muscle function.  Heart valve function.  Signs of a past heart attack.  Fluid buildup around the heart.  Thickening of the heart muscle.  A tumor or infectious growth around the heart valves. Tell a health care provider about:  Any allergies you have.  All medicines you are taking, including vitamins, herbs, eye drops, creams, and over-the-counter medicines.  Any blood disorders you have.  Any surgeries you have had.  Any medical conditions you have.  Whether you are pregnant or may be pregnant. What are the risks? Generally, this is a safe procedure. However, problems may occur, including:  Allergic reaction to dye (contrast) that may be used during the procedure. What happens before the procedure? No specific preparation is needed. You may eat and drink normally. What happens during the procedure?   An IV tube may be inserted into one of your veins.  You may receive contrast through this tube. A contrast is an injection that improves the quality of the pictures from your heart.  A gel will be applied to your chest.  A wand-like tool (transducer) will be moved over your chest. The gel will help to transmit the sound waves from the transducer.  The sound waves will harmlessly bounce off of your heart to allow the heart images to be captured in real-time motion. The images will be recorded   on a computer. The procedure may vary among health care providers and hospitals. What happens after the procedure?  You may return to your normal, everyday life, including diet, activities, and medicines, unless your health care provider tells you not to do that. Summary  An echocardiogram is a procedure that uses painless sound waves (ultrasound) to produce  an image of the heart.  Images from an echocardiogram can provide important information about the size and shape of your heart, heart muscle function, heart valve function, and fluid buildup around your heart.  You do not need to do anything to prepare before this procedure. You may eat and drink normally.  After the echocardiogram is completed, you may return to your normal, everyday life, unless your health care provider tells you not to do that. This information is not intended to replace advice given to you by your health care provider. Make sure you discuss any questions you have with your health care provider. Document Released: 08/20/2000 Document Revised: 12/14/2018 Document Reviewed: 09/25/2016 Elsevier Patient Education  2020 Elsevier Inc.   

## 2019-07-25 ENCOUNTER — Ambulatory Visit (HOSPITAL_COMMUNITY)
Admission: RE | Admit: 2019-07-25 | Discharge: 2019-07-25 | Disposition: A | Discharge status: Home / Self Care | Payer: PPO | Source: Ambulatory Visit | Attending: Internal Medicine | Admitting: Internal Medicine

## 2019-07-25 ENCOUNTER — Other Ambulatory Visit: Payer: Self-pay

## 2019-07-25 DIAGNOSIS — K922 Gastrointestinal hemorrhage, unspecified: Secondary | ICD-10-CM

## 2019-07-25 DIAGNOSIS — D509 Iron deficiency anemia, unspecified: Secondary | ICD-10-CM | POA: Diagnosis not present

## 2019-07-25 MED ORDER — SODIUM CHLORIDE 0.9 % IV SOLN
510.0000 mg | INTRAVENOUS | Status: DC
  Administered 2019-07-25: 510 mg via INTRAVENOUS
  Filled 2019-07-25: qty 17

## 2019-07-25 MED ORDER — SODIUM CHLORIDE 0.9 % IV SOLN
INTRAVENOUS | Status: DC | PRN
  Administered 2019-07-25: 250 mL via INTRAVENOUS

## 2019-07-25 NOTE — Discharge Instructions (Signed)

## 2019-07-31 ENCOUNTER — Telehealth: Payer: Self-pay | Admitting: Gastroenterology

## 2019-07-31 MED ORDER — NA SULFATE-K SULFATE-MG SULF 17.5-3.13-1.6 GM/177ML PO SOLN: 1.0000 | Freq: Once | ORAL | 0 refills | Status: AC

## 2019-07-31 NOTE — Telephone Encounter (Signed)
Informed patient that prep was changed and [facility]. Sent patient new instructions via mychart.

## 2019-08-01 ENCOUNTER — Encounter: Payer: Self-pay | Admitting: Gastroenterology

## 2019-08-03 ENCOUNTER — Other Ambulatory Visit: Payer: Self-pay | Admitting: Cardiology

## 2019-08-04 ENCOUNTER — Other Ambulatory Visit: Payer: Self-pay | Admitting: Physician Assistant

## 2019-08-06 ENCOUNTER — Ambulatory Visit (INDEPENDENT_AMBULATORY_CARE_PROVIDER_SITE_OTHER): Payer: PPO

## 2019-08-06 ENCOUNTER — Other Ambulatory Visit: Payer: Self-pay | Admitting: Gastroenterology

## 2019-08-06 ENCOUNTER — Telehealth: Payer: Self-pay | Admitting: *Deleted

## 2019-08-06 DIAGNOSIS — Z1159 Encounter for screening for other viral diseases: Secondary | ICD-10-CM | POA: Diagnosis not present

## 2019-08-06 NOTE — Telephone Encounter (Signed)
John nulty reviewed chart and okay for pt. To be done in [place].

## 2019-08-07 LAB — SARS CORONAVIRUS 2 (TAT 6-24 HRS): SARS Coronavirus 2: NEGATIVE

## 2019-08-08 ENCOUNTER — Other Ambulatory Visit: Payer: Self-pay

## 2019-08-08 ENCOUNTER — Other Ambulatory Visit (HOSPITAL_COMMUNITY): Payer: Self-pay | Admitting: *Deleted

## 2019-08-08 ENCOUNTER — Ambulatory Visit (AMBULATORY_SURGERY_CENTER): Payer: PPO | Admitting: Gastroenterology

## 2019-08-08 ENCOUNTER — Encounter: Payer: Self-pay | Admitting: Gastroenterology

## 2019-08-08 VITALS — BP 119/79 | HR 73 | Temp 98.0°F | Resp 14 | Ht 72.0 in | Wt 167.0 lb

## 2019-08-08 DIAGNOSIS — D123 Benign neoplasm of transverse colon: Secondary | ICD-10-CM

## 2019-08-08 DIAGNOSIS — Z86010 Personal history of colonic polyps: Secondary | ICD-10-CM | POA: Diagnosis not present

## 2019-08-08 DIAGNOSIS — D509 Iron deficiency anemia, unspecified: Secondary | ICD-10-CM

## 2019-08-08 DIAGNOSIS — I4891 Unspecified atrial fibrillation: Secondary | ICD-10-CM | POA: Diagnosis not present

## 2019-08-08 DIAGNOSIS — Z1211 Encounter for screening for malignant neoplasm of colon: Secondary | ICD-10-CM | POA: Diagnosis not present

## 2019-08-08 MED ORDER — SODIUM CHLORIDE 0.9 % IV SOLN: 500.0000 mL | Freq: Once | INTRAVENOUS | Status: DC

## 2019-08-08 NOTE — Progress Notes (Signed)
Called to room to assist during endoscopic procedure.  Patient ID and intended procedure confirmed with present staff. Received instructions for my participation in the procedure from the performing physician.  

## 2019-08-08 NOTE — Progress Notes (Signed)
Report given to PACU, vss 

## 2019-08-08 NOTE — Patient Instructions (Addendum)
Handout on polyps. Resume Xarelto today  YOU HAD AN ENDOSCOPIC PROCEDURE TODAY AT [facility]:   Refer to the procedure report that was given to you for any specific questions about what was found during the examination.  If the procedure report does not answer your questions, please call your gastroenterologist to clarify.  If you requested that your care partner not be given the details of your procedure findings, then the procedure report has been included in a sealed envelope for you to review at your convenience later.  YOU SHOULD EXPECT: Some feelings of bloating in the abdomen. Passage of more gas than usual.  Walking can help get rid of the air that was put into your GI tract during the procedure and reduce the bloating. If you had a lower endoscopy (such as a colonoscopy or flexible sigmoidoscopy) you may notice spotting of blood in your stool or on the toilet paper. If you underwent a bowel prep for your procedure, you may not have a normal bowel movement for a few days.  Please Note:  You might notice some irritation and congestion in your nose or some drainage.  This is from the oxygen used during your procedure.  There is no need for concern and it should clear up in a day or so.  SYMPTOMS TO REPORT IMMEDIATELY:   Following lower endoscopy (colonoscopy or flexible sigmoidoscopy):  Excessive amounts of blood in the stool  Significant tenderness or worsening of abdominal pains  Swelling of the abdomen that is new, acute  Fever of 100F or higher   For urgent or emergent issues, a gastroenterologist can be reached at any hour by calling [phone].   DIET:  We do recommend a small meal at first, but then you may proceed to your regular diet.  Drink plenty of fluids but you should avoid alcoholic beverages for 24 hours.  ACTIVITY:  You should plan to take it easy for the rest of today and you should NOT DRIVE or use heavy machinery until tomorrow (because of the  sedation medicines used during the test).    FOLLOW UP: Our staff will call the number listed on your records 48-72 hours following your procedure to check on you and address any questions or concerns that you may have regarding the information given to you following your procedure. If we do not reach you, we will leave a message.  We will attempt to reach you two times.  During this call, we will ask if you have developed any symptoms of COVID 19. If you develop any symptoms (ie: fever, flu-like symptoms, shortness of breath, cough etc.) before then, please call [phone].  If you test positive for Covid 19 in the 2 weeks post procedure, please call and report this information to [place].    If any biopsies were taken you will be contacted by phone or by letter within the next 1-3 weeks.  Please call us at [phone] if you have not heard about the biopsies in 3 weeks.    SIGNATURES/CONFIDENTIALITY: You and/or your care partner have signed paperwork which will be entered into your electronic medical record.  These signatures attest to the fact that that the information above on your After Visit Summary has been reviewed and is understood.  Full responsibility of the confidentiality of this discharge information lies with you and/or your care-partner.

## 2019-08-08 NOTE — Op Note (Addendum)
[facility] Patient Name: Johnathan Bauer Procedure Date: 08/08/2019 10:46 AM MRN: [id number] Endoscopist: Milus Banister , MD Age: 68 Referring MD:  Date of Birth: 06-Nov-1950 Gender: Male Account #: [id number] Procedure:                Colonoscopy Indications:              High risk colon cancer surveillance: Personal                            history of colonic polyps, IDA; Severe sigmoid                            diverticulitis, s/p 2013 segmental colectomy with                            temporary colostomy. Colonoscopy 2014 via anus and                            ostomy found 3 subCM adenomas. Colonoscoy 2018                            found three subCM polyps (two TAs and one SSA) and                            unusual sigmoid anastomosis that was tatooed,                            biopsied (benign) Medicines:                Monitored Anesthesia Care Procedure:                Pre-Anesthesia Assessment:                           - Prior to the procedure, a History and Physical                            was performed, and patient medications and                            allergies were reviewed. The patient's tolerance of                            previous anesthesia was also reviewed. The risks                            and benefits of the procedure and the sedation                            options and risks were discussed with the patient.                            All questions were answered, and informed consent  was obtained. Prior Anticoagulants: The patient has                            taken Xarelto (rivaroxaban), last dose was 2 days                            prior to procedure. ASA Grade Assessment: II - A                            patient with mild systemic disease. After reviewing                            the risks and benefits, the patient was deemed in                            satisfactory condition to undergo the  procedure.                           After obtaining informed consent, the colonoscope                            was passed under direct vision. Throughout the                            procedure, the patient's blood pressure, pulse, and                            oxygen saturations were monitored continuously. The                            Colonoscope was introduced through the anus and                            advanced to the the cecum, identified by                            appendiceal orifice and ileocecal valve. The                            colonoscopy was performed without difficulty. The                            patient tolerated the procedure well. The quality                            of the bowel preparation was good. The ileocecal                            valve, appendiceal orifice, and rectum were                            photographed. Scope In: 10:49:39 AM Scope Out: 10:58:30 AM Scope Withdrawal Time: 0 hours 7 minutes 23 seconds  Total Procedure Duration:  0 hours 8 minutes 51 seconds  Findings:                 Normal appearing sigmoid anastomosis, previous Spot                            tattoo was evident.                           A 2 mm polyp was found in the transverse colon. The                            polyp was sessile. The polyp was removed with a                            cold snare. Resection and retrieval were complete.                           The exam was otherwise without abnormality on                            direct and retroflexion views. Complications:            No immediate complications. Estimated blood loss:                            None. Estimated Blood Loss:     Estimated blood loss: none. Impression:               - Normal appearing sigmoid anastomosis, previous                            Spot tattoo was evident.                           - One 2 mm polyp in the transverse colon, removed                            with a  cold snare. Resected and retrieved.                           - The examination was otherwise normal on direct                            and retroflexion views. Recommendation:           - Patient has a contact number available for                            emergencies. The signs and symptoms of potential                            delayed complications were discussed with the                            patient. Return to normal activities tomorrow.  Written discharge instructions were provided to the                            patient.                           - Resume previous diet.                           - Continue present medications. OK to resume your                            blood thinner xarelto today.                           - Await pathology results.                           - Dr. Ardis Hughs' office to arrange repeat CBC in 6                            weeks to follow your blood counts. Milus Banister, MD 08/08/2019 11:05:50 AM This report has been signed electronically.

## 2019-08-09 ENCOUNTER — Other Ambulatory Visit: Payer: Self-pay

## 2019-08-09 DIAGNOSIS — D509 Iron deficiency anemia, unspecified: Secondary | ICD-10-CM

## 2019-08-10 ENCOUNTER — Telehealth: Payer: Self-pay

## 2019-08-10 ENCOUNTER — Telehealth: Payer: Self-pay | Admitting: *Deleted

## 2019-08-10 NOTE — Telephone Encounter (Signed)
  Follow up Call-  Call back number 08/08/2019 03/15/2017  Post procedure Call Back phone  # [phone]  Permission to leave phone message Yes Yes  Some recent data might be hidden     Patient questions:  Do you have a fever, pain , or abdominal swelling? No. Pain Score  0 *  Have you tolerated food without any problems? Yes.    Have you been able to return to your normal activities? Yes.    Do you have any questions about your discharge instructions: Diet   No. Medications  No. Follow up visit  No.  Do you have questions or concerns about your Care? No.  Actions: * If pain score is 4 or above: No action needed, pain <4.   1. Have you developed a fever since your procedure? no  2.   Have you had an respiratory symptoms (SOB or cough) since your procedure? no  3.   Have you tested positive for COVID 19 since your procedure no  4.   Have you had any family members/close contacts diagnosed with the COVID 19 since your procedure?  no   If yes to any of these questions please route to Joylene John, RN and Alphonsa Gin, Therapist, sports.

## 2019-08-10 NOTE — Telephone Encounter (Signed)
Left message on follow up call. 

## 2019-08-10 NOTE — Telephone Encounter (Signed)
[facility] [place]

## 2019-08-13 ENCOUNTER — Encounter: Payer: Self-pay | Admitting: Gastroenterology

## 2019-08-22 ENCOUNTER — Other Ambulatory Visit: Payer: Self-pay | Admitting: Neurosurgery

## 2019-08-22 DIAGNOSIS — I671 Cerebral aneurysm, nonruptured: Secondary | ICD-10-CM

## 2019-08-23 ENCOUNTER — Other Ambulatory Visit: Payer: Self-pay | Admitting: Cardiology

## 2019-08-24 IMAGING — XA IR ANGIO INTRA EXTRACRAN SEL INTERNAL CAROTID BILAT MOD SED
9 of 11 series · 12 of 24 positions shown · non-contrast
Comparison: none

PROCEDURE:
DIAGNOSTIC CEREBRAL ANGIOGRAM
HISTORY: The patient is a 68-year-old man recently presenting to the
emergency department with transient left-sided visual obscuration.
Workup included CT angiogram which demonstrated likelihood of a
partially thrombosed left carotid aneurysm. After outpatient
consultation, he presents today for further workup with diagnostic
cerebral angiogram.
TECHNIQUE: CATHETERS AND WIRES
5-French JB-1 catheter

[Series 1: cerebral care 2 · 2 acquisitions, 1 frame shown (1 of 8)]
[im 1/2]
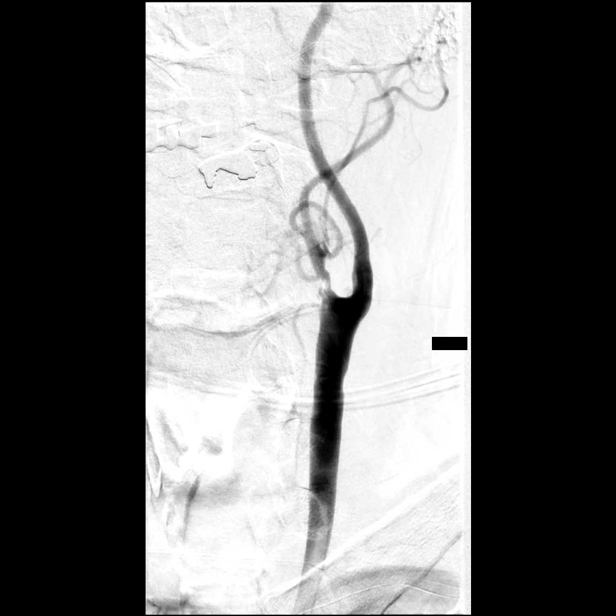

[Series 2: cerebral care 2 · 2 acquisitions, 2 frames shown (2 of 8)]
[im 1/2]
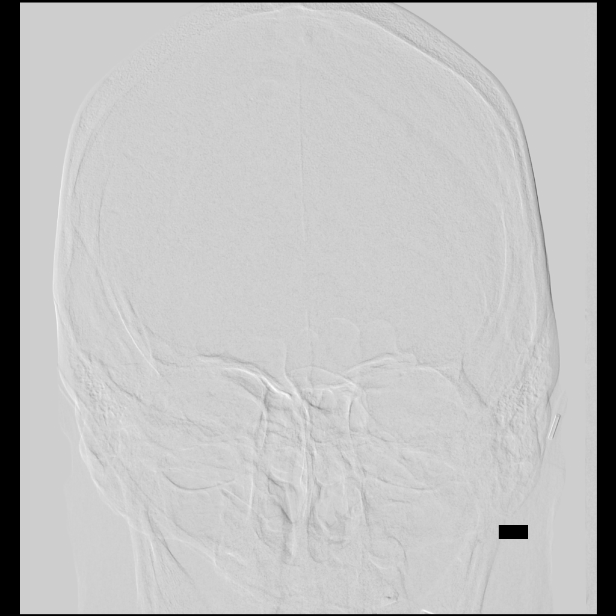
[im 2/2]
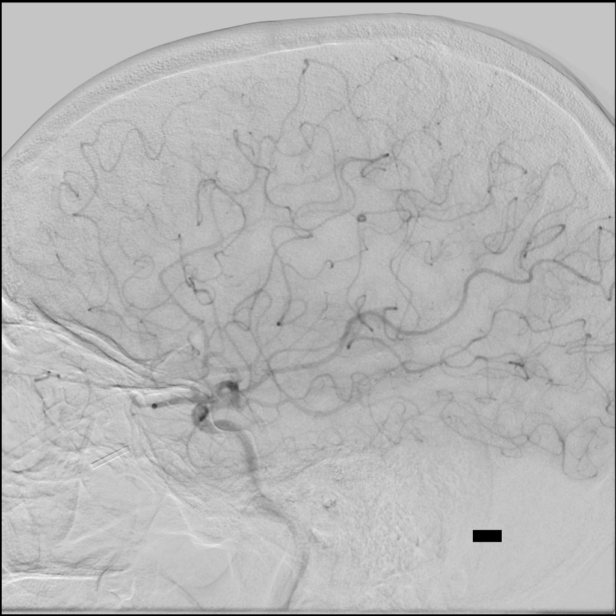

[Series 3: cerebral care 2 · 2 acquisitions, 1 frame shown (3 of 8)]
[im 1/2]
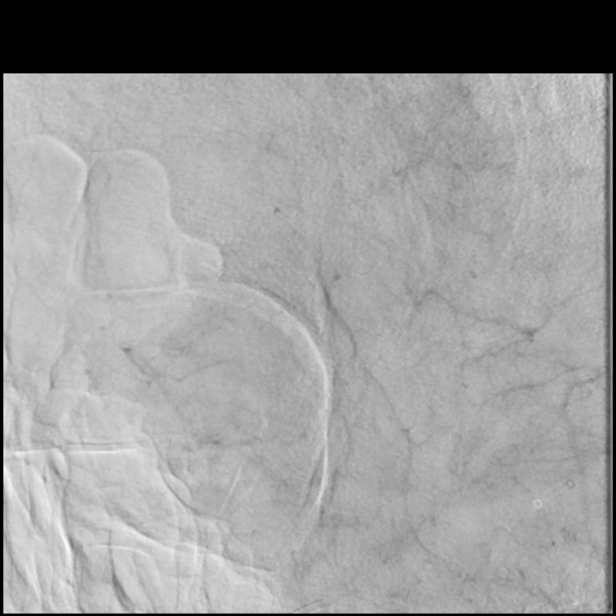

[Series 5: cerebral care 2 · 2 acquisitions, 1 frame shown (4 of 8)]
[im 1/2]
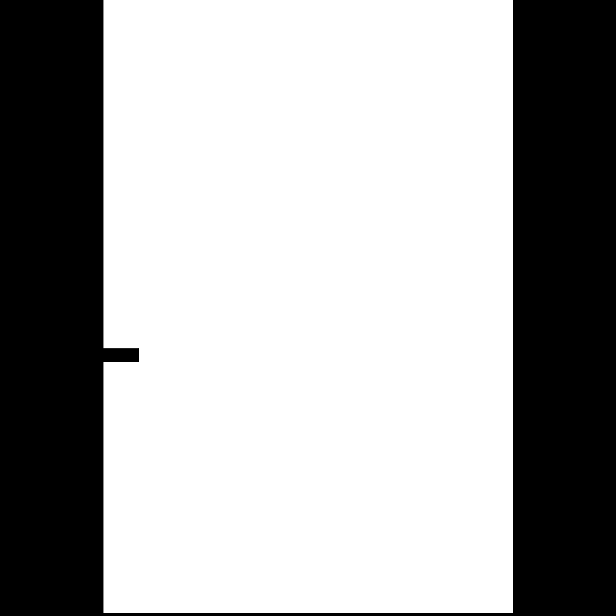

[Series 6: cerebral care 2 · 2 acquisitions, 1 frame shown (5 of 8)]
[im 1/2]
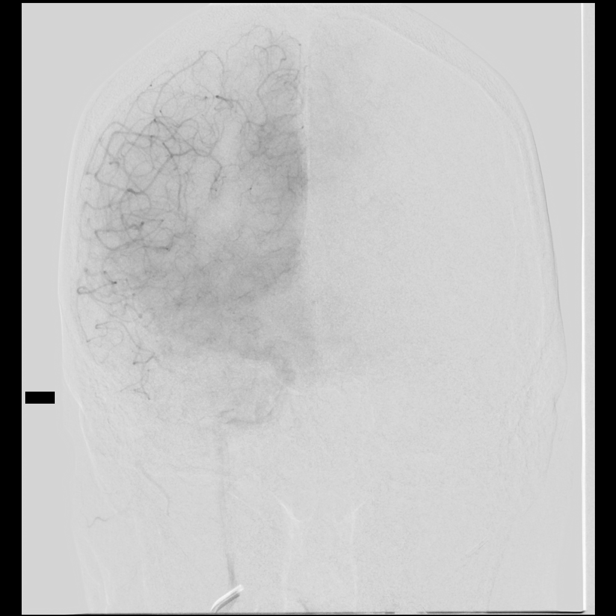

[Series 7: cerebral care 2 · 2 acquisitions, 1 frame shown (6 of 8)]
[im 1/2]
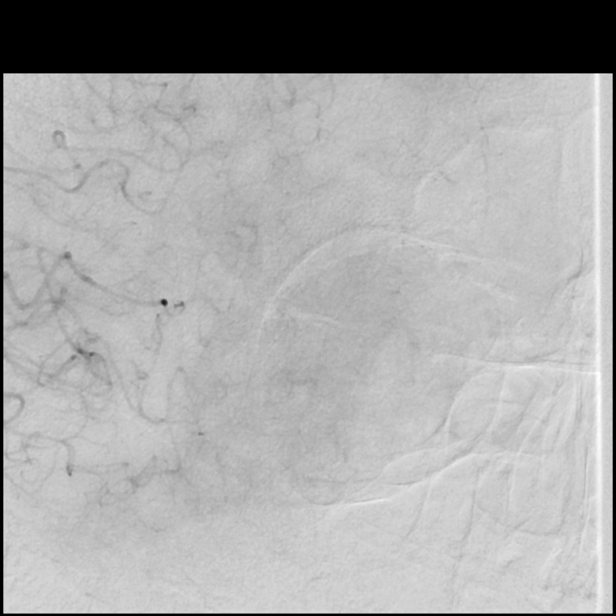

[Series 8: cerebral care 2 · 1 of 12 frames shown (7 of 8)]
[frame 11/12]
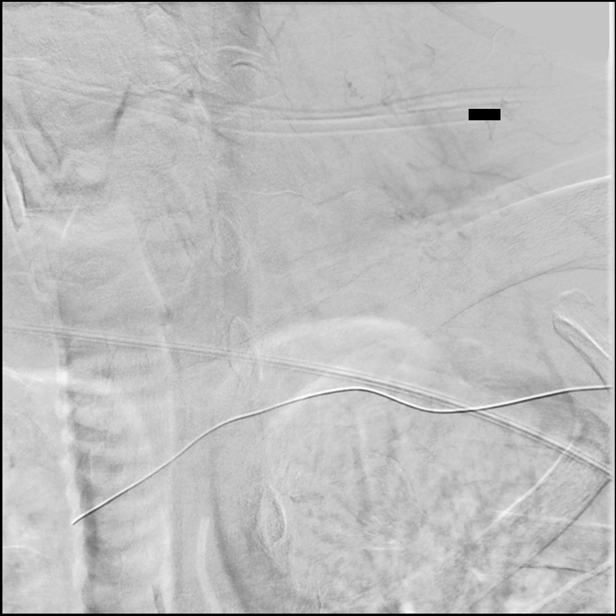

[Series 10: cerebral care 2 · 2 acquisitions, 1 frame shown (8 of 8)]
[im 1/2]
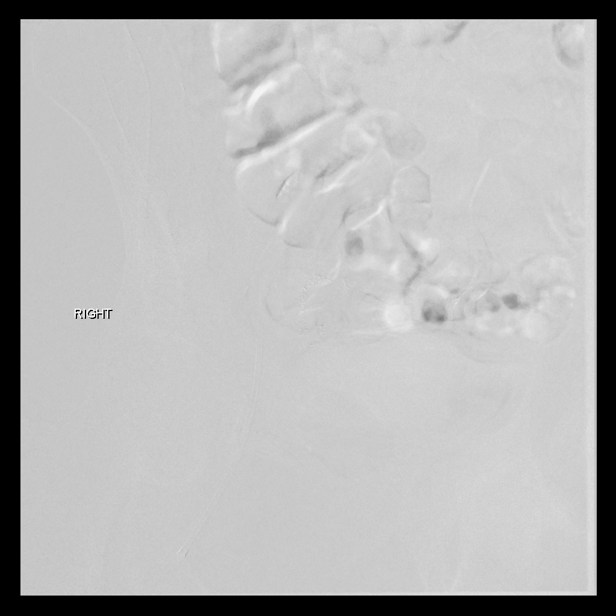

[Series 300: dr. (person_name) · 3 of 17 slices shown]
[im 2/17]
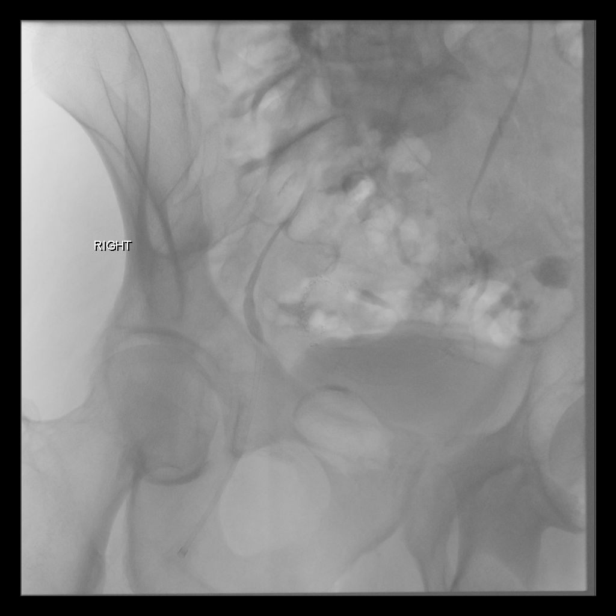
[im 9/17]
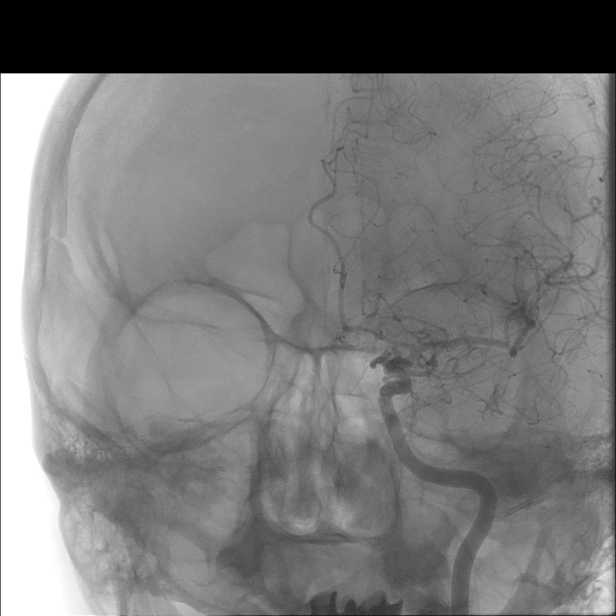
[im 17/17]
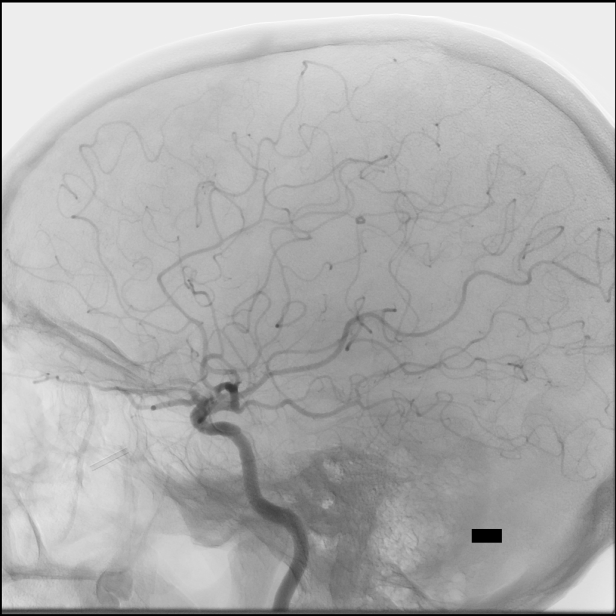

[12 of 24 positions shown; findings below may reference images not displayed]

ACCESS:
The technical aspects of the procedure as well as its potential
risks and benefits were reviewed with the patient. These risks
included but were not limited bleeding, infection, allergic
reaction, damage to organs or vital structures, stroke,
non-diagnostic procedure, and the catastrophic outcomes of heart
attack, coma, and death. With an understanding of these risks,
informed consent was obtained and witnessed. The patient was placed
in the supine position on the angiography table and the skin of
right groin prepped in the usual sterile fashion.

The procedure was performed under local anesthesia (1%-solution of
bicarbonate-buffered Lidocaine) and conscious sedation with 1mg
versed and 55micrograms fentanyl monitored by myself and the
in-suite nurse using continuous pulse-oximetry, heart rate, and
non-invasive blood-pressure.

A 5- French sheath was introduced in the right radial artery using
Seldinger technique. A fluoro-phase sequence was used to document
the sheath position.

MEDICATIONS:
HEPARIN: 9555 Units total.

CONTRAST:  cc, Omnipaque 300

FLUOROSCOPY TIME:  FLUOROSCOPY TIME: See IR records
0.035" glidewire

VESSELS CATHETERIZED
Right common carotid

Right internal carotid

Left common carotid

Left internal carotid

Left subclavian

Right common femoral

VESSELS STUDIED
Right common carotid, neck

Right internal carotid, head

Left common carotid, neck

Left internal carotid, head

Left internal carotid, 3D rotational angiogram

Left subclavian

Right femoral

PROCEDURAL NARRATIVE
A 5-Fr JB-1 catheter was advanced over a 0.035 glidewire into the
aortic arch. The above vessels were then sequentially catheterized
and cervical / cerebral angiograms taken. After review of images,
the catheter was removed without incident.
FINDINGS: Right common carotid, neck:

The common carotid artery bifurcation is without significant
stenosis or atherosclerotic disease. Visualized cervical branches of
the external carotid artery are unremarkable.

Right internal carotid: head:

Injection reveals the presence of a widely patent ICA, M1, and A1
segments and their branches. No aneurysms, AVMs, or high-flow
fistulas are seen. There is no stenosis of the right internal
carotid artery. The parenchymal and venous phases are normal. The
venous sinuses are widely patent.

Left common carotid, neck:

The left common carotid artery bifurcation is unremarkable. There is
calcific atherosclerotic disease involving the origin of the left
external carotid artery with mild non flow limiting stenosis. There
is no significant stenosis of the left internal carotid artery.
Cervical branches of the left external carotid artery are
unremarkable.

Left internal carotid: head:

Injection reveals the presence of a widely patent ICA, A1, and M1
segments and their branches. There is an aneurysm arising from the
ophthalmic segment of the left internal carotid artery which
projects superiorly, as well as another smaller aneurysm on the
opposite side of the carotid which projects inferiorly and medially.
This aneurysm is better delineated on the three-dimensional
rotational angiogram. There is no significant stenosis of the
intracranial left internal carotid artery. The parenchymal and
venous phases are normal. The venous sinuses are widely patent.

Left internal carotid, 3D rotation

3-dimensional rotational angiographic images were reconstructed on
an independent workstation for review. These further delineate the
left ophthalmic segment aneurysm. There is an irregular contour to
the superior margin of the aneurysm suggesting that it is in fact
partially thrombosed. Of note, the origin of the left ophthalmic
artery appears to arise from the neck of the aneurysm. Again seen is
a smaller aneurysm on the opposite wall of the carotid artery
projecting inferiorly.

Left subclavian artery:

The left subclavian artery is largely unremarkable. The left
vertebral artery appears to be somewhat small in caliber, although
no obvious ostial stenosis is identified. The remainder of the left
subclavian branches including the thyrocervical and costal cervical
trunks as well as the left internal mammary artery are unremarkable.

Right femoral:

Normal vessel. No significant atherosclerotic disease. Arterial
sheath in adequate position.

DISPOSITION:
Upon completion of the study, the femoral sheath was removed and
hemostasis obtained using a 5-Fr ExoSeal closure device. Good
proximal and distal lower extremity pulses were documented upon
achievement of hemostasis. The procedure was well tolerated and no
early complications were observed. The patient was transferred to
the holding area to lay flat for 2 hours.
IMPRESSION: 1. Partially thrombosed left ophthalmic segment aneurysm as
described above.

2. No significant cervical or intracranial stenosis of the internal
carotid arteries bilaterally

The preliminary results of this procedure were shared with the
patient and the patient's family.

## 2019-09-02 ENCOUNTER — Other Ambulatory Visit: Payer: Self-pay | Admitting: Cardiology

## 2019-09-02 ENCOUNTER — Other Ambulatory Visit: Payer: Self-pay | Admitting: Physician Assistant

## 2019-09-11 ENCOUNTER — Other Ambulatory Visit (HOSPITAL_COMMUNITY)
Admission: RE | Admit: 2019-09-11 | Discharge: 2019-09-11 | Disposition: A | Discharge status: Home / Self Care | Payer: PPO | Source: Ambulatory Visit | Attending: Neurosurgery | Admitting: Neurosurgery

## 2019-09-11 DIAGNOSIS — Z20822 Contact with and (suspected) exposure to covid-19: Secondary | ICD-10-CM | POA: Insufficient documentation

## 2019-09-11 DIAGNOSIS — Z01812 Encounter for preprocedural laboratory examination: Secondary | ICD-10-CM | POA: Diagnosis not present

## 2019-09-11 LAB — SARS CORONAVIRUS 2 (TAT 6-24 HRS): SARS Coronavirus 2: NEGATIVE

## 2019-09-12 ENCOUNTER — Ambulatory Visit (INDEPENDENT_AMBULATORY_CARE_PROVIDER_SITE_OTHER): Payer: PPO

## 2019-09-12 ENCOUNTER — Other Ambulatory Visit: Payer: Self-pay | Admitting: Neurosurgery

## 2019-09-12 ENCOUNTER — Other Ambulatory Visit: Payer: Self-pay

## 2019-09-12 DIAGNOSIS — I482 Chronic atrial fibrillation, unspecified: Secondary | ICD-10-CM | POA: Diagnosis not present

## 2019-09-12 DIAGNOSIS — I2510 Atherosclerotic heart disease of native coronary artery without angina pectoris: Secondary | ICD-10-CM | POA: Diagnosis not present

## 2019-09-12 NOTE — Progress Notes (Signed)
Complete echocardiogram has been performed.  Jimmy Vitoria Conyer RDCS, RVT 

## 2019-09-13 ENCOUNTER — Other Ambulatory Visit: Payer: Self-pay | Admitting: Student

## 2019-09-13 ENCOUNTER — Other Ambulatory Visit: Payer: Self-pay | Admitting: Radiology

## 2019-09-14 ENCOUNTER — Encounter (HOSPITAL_COMMUNITY): Payer: Self-pay

## 2019-09-14 ENCOUNTER — Other Ambulatory Visit: Payer: Self-pay | Admitting: Neurosurgery

## 2019-09-14 ENCOUNTER — Ambulatory Visit (HOSPITAL_COMMUNITY)
Admission: RE | Admit: 2019-09-14 | Discharge: 2019-09-14 | Disposition: A | Discharge status: Home / Self Care | Payer: PPO | Source: Ambulatory Visit | Attending: Neurosurgery | Admitting: Neurosurgery

## 2019-09-14 ENCOUNTER — Other Ambulatory Visit: Payer: Self-pay

## 2019-09-14 DIAGNOSIS — I2510 Atherosclerotic heart disease of native coronary artery without angina pectoris: Secondary | ICD-10-CM | POA: Insufficient documentation

## 2019-09-14 DIAGNOSIS — I671 Cerebral aneurysm, nonruptured: Secondary | ICD-10-CM

## 2019-09-14 DIAGNOSIS — Z79899 Other long term (current) drug therapy: Secondary | ICD-10-CM | POA: Insufficient documentation

## 2019-09-14 DIAGNOSIS — Z7902 Long term (current) use of antithrombotics/antiplatelets: Secondary | ICD-10-CM | POA: Diagnosis not present

## 2019-09-14 DIAGNOSIS — I10 Essential (primary) hypertension: Secondary | ICD-10-CM | POA: Insufficient documentation

## 2019-09-14 DIAGNOSIS — Z48812 Encounter for surgical aftercare following surgery on the circulatory system: Secondary | ICD-10-CM | POA: Diagnosis not present

## 2019-09-14 DIAGNOSIS — Z8673 Personal history of transient ischemic attack (TIA), and cerebral infarction without residual deficits: Secondary | ICD-10-CM | POA: Insufficient documentation

## 2019-09-14 DIAGNOSIS — Z7901 Long term (current) use of anticoagulants: Secondary | ICD-10-CM | POA: Insufficient documentation

## 2019-09-14 DIAGNOSIS — Z87891 Personal history of nicotine dependence: Secondary | ICD-10-CM | POA: Insufficient documentation

## 2019-09-14 DIAGNOSIS — I482 Chronic atrial fibrillation, unspecified: Secondary | ICD-10-CM | POA: Diagnosis not present

## 2019-09-14 DIAGNOSIS — Z7982 Long term (current) use of aspirin: Secondary | ICD-10-CM | POA: Diagnosis not present

## 2019-09-14 HISTORY — PX: IR ANGIO INTRA EXTRACRAN SEL INTERNAL CAROTID UNI L MOD SED: IMG5361

## 2019-09-14 HISTORY — PX: IR US GUIDE VASC ACCESS RIGHT: IMG2390

## 2019-09-14 LAB — CBC WITH DIFFERENTIAL/PLATELET
Abs Immature Granulocytes: 0.01 10*3/uL (ref 0.00–0.07)
Basophils Absolute: 0 10*3/uL (ref 0.0–0.1)
Basophils Relative: 1 %
Eosinophils Absolute: 0.2 10*3/uL (ref 0.0–0.5)
Eosinophils Relative: 4 %
HCT: 54.1 % — ABNORMAL HIGH (ref 39.0–52.0)
Hemoglobin: 17.6 g/dL — ABNORMAL HIGH (ref 13.0–17.0)
Immature Granulocytes: 0 %
Lymphocytes Relative: 29 %
Lymphs Abs: 1.3 10*3/uL (ref 0.7–4.0)
MCH: 29.5 pg (ref 26.0–34.0)
MCHC: 32.5 g/dL (ref 30.0–36.0)
MCV: 90.6 fL (ref 80.0–100.0)
Monocytes Absolute: 0.7 10*3/uL (ref 0.1–1.0)
Monocytes Relative: 16 %
Neutro Abs: 2.3 10*3/uL (ref 1.7–7.7)
Neutrophils Relative %: 50 %
Platelets: 167 10*3/uL (ref 150–400)
RBC: 5.97 MIL/uL — ABNORMAL HIGH (ref 4.22–5.81)
RDW: 18.7 % — ABNORMAL HIGH (ref 11.5–15.5)
WBC: 4.6 10*3/uL (ref 4.0–10.5)
nRBC: 0 % (ref 0.0–0.2)

## 2019-09-14 LAB — BASIC METABOLIC PANEL
Anion gap: 12 (ref 5–15)
BUN: 16 mg/dL (ref 8–23)
CO2: 24 mmol/L (ref 22–32)
Calcium: 8.7 mg/dL — ABNORMAL LOW (ref 8.9–10.3)
Chloride: 103 mmol/L (ref 98–111)
Creatinine, Ser: 1.18 mg/dL (ref 0.61–1.24)
GFR calc Af Amer: >60 mL/min (ref >60)
GFR calc non Af Amer: >60 mL/min (ref >60)
Glucose, Bld: 98 mg/dL (ref 70–99)
Potassium: 4.7 mmol/L (ref 3.5–5.1)
Sodium: 139 mmol/L (ref 135–145)

## 2019-09-14 LAB — PROTIME-INR
INR: 2.2 — ABNORMAL HIGH (ref 0.8–1.2)
Prothrombin Time: 24.3 seconds — ABNORMAL HIGH (ref 11.4–15.2)

## 2019-09-14 LAB — APTT: aPTT: 40 seconds — ABNORMAL HIGH (ref 24–36)

## 2019-09-14 MED ORDER — LIDOCAINE HCL 1 % IJ SOLN
INTRAMUSCULAR | Status: AC
  Filled 2019-09-14: qty 20

## 2019-09-14 MED ORDER — NITROGLYCERIN 1 MG/10 ML FOR IR/CATH LAB
INTRA_ARTERIAL | Status: AC
  Filled 2019-09-14: qty 10

## 2019-09-14 MED ORDER — SODIUM CHLORIDE 0.9 % IV SOLN: INTRAVENOUS | Status: DC

## 2019-09-14 MED ORDER — FENTANYL CITRATE (PF) 100 MCG/2ML IJ SOLN
INTRAMUSCULAR | Status: AC
  Filled 2019-09-14: qty 2

## 2019-09-14 MED ORDER — CHLORHEXIDINE GLUCONATE CLOTH 2 % EX PADS: 6.0000 | MEDICATED_PAD | Freq: Once | CUTANEOUS | Status: DC

## 2019-09-14 MED ORDER — LIDOCAINE HCL (PF) 1 % IJ SOLN
INTRAMUSCULAR | Status: AC | PRN
  Administered 2019-09-14: 1 mg

## 2019-09-14 MED ORDER — VERAPAMIL HCL 2.5 MG/ML IV SOLN
INTRAVENOUS | Status: AC
  Filled 2019-09-14: qty 2

## 2019-09-14 MED ORDER — IOHEXOL 300 MG/ML  SOLN: 100.0000 mL | Freq: Once | INTRAMUSCULAR | Status: DC | PRN

## 2019-09-14 MED ORDER — MIDAZOLAM HCL 2 MG/2ML IJ SOLN
INTRAMUSCULAR | Status: AC | PRN
  Administered 2019-09-14: 1 mg via INTRAVENOUS

## 2019-09-14 MED ORDER — CEFAZOLIN SODIUM-DEXTROSE 2-4 GM/100ML-% IV SOLN: 2.0000 g | INTRAVENOUS | Status: DC

## 2019-09-14 MED ORDER — HEPARIN SODIUM (PORCINE) 1000 UNIT/ML IJ SOLN
INTRAMUSCULAR | Status: AC | PRN
  Administered 2019-09-14: 2000 [IU] via INTRAVENOUS

## 2019-09-14 MED ORDER — MIDAZOLAM HCL 2 MG/2ML IJ SOLN
INTRAMUSCULAR | Status: AC
  Filled 2019-09-14: qty 2

## 2019-09-14 MED ORDER — HEPARIN SODIUM (PORCINE) 1000 UNIT/ML IJ SOLN
INTRAMUSCULAR | Status: AC
  Filled 2019-09-14: qty 1

## 2019-09-14 MED ORDER — FENTANYL CITRATE (PF) 100 MCG/2ML IJ SOLN
INTRAMUSCULAR | Status: AC | PRN
  Administered 2019-09-14: 25 ug via INTRAVENOUS

## 2019-09-14 NOTE — Progress Notes (Signed)
Discharge instructions reviewed with pt and his wife (via telephone) both voice understanding.  

## 2019-09-14 NOTE — H&P (Signed)
Chief Complaint   Left internal carotid artery aneurysm  HPI   HPI: Johnathan Bauer is a 69 y.o. 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  He is maintained on aspirin, plavix was d/c at 3 months as he is also on Xarelto for atrial fibrillation.  He presents today for routine follow-up diagnostic cerebral angiogram for monitoring.  He is without any concerns.  Patient Active Problem List   Diagnosis Date Noted  . Absolute anemia   . Melena   . Erosive gastritis with hemorrhage   . UGIB (upper gastrointestinal bleed) 06/08/2019  . Cerebral aneurysm, nonruptured 03/13/2019  . Vision changes 01/10/2019  . Stroke-like symptoms 01/10/2019  . History of stroke 01/10/2019  . Blurry vision, bilateral 01/10/2019  . Dyspnea on exertion 07/08/2017  . History of colonic polyps 11/30/2016  . Chronic anticoagulation 11/30/2016  . Alcohol abuse 05/25/2016  . 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 Coronary artery disease involving native coronary artery of native heart without angina pectoris 05/20/2015  . Essential hypertension 05/20/2015  . S/P hernia repair 12/31/2014  . Respiratory failure, post-operative ([place]) 09/07/2012  . NSVT, 7 beat run 1/02 09/07/2012  . Chronic atrial fibrillation ([place]): Anticoagulated his chads 2 Vas score equals 3 09/03/2012  . 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 Perforated sigmoid colon ([place]) 09/03/2012  . Diverticulitis of sigmoid colon 09/02/2012    PMH: Past Medical History:  Diagnosis Date  . Anemia   . 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 Arthritis    "touch in my right thumb" (12/31/2014)  . Blood transfusion without reported diagnosis   . Chronic atrial fibrillation (HCC)    did not like coumadin  . Chronic kidney disease 09/2012   patiet denies 08/08/19  . Coronary  artery disease    s/p PCI Sept 2013 with DES OM  . Diverticulitis with perforation   . Dysrhythmia    A-fib  . GI bleed   . Pneumonia 09/2012  . Stroke [facility]) 04/2016    PSH: Past Surgical History:  Procedure Laterality Date  . BIOPSY  06/09/2019   Procedure: BIOPSY;  Surgeon: [name], MD;  Location: [facility] ENDOSCOPY;  Service: Endoscopy;;  . COLECTOMY WITH COLOSTOMY CREATION/HARTMANN PROCEDURE  2013  . COLONOSCOPY    . COLOSTOMY REVISION  09/02/2012   Procedure: COLON RESECTION SIGMOID;  Surgeon: Serafina Mitchell, MD;  Location: [facility] OR;  Service: Vascular;  Laterality: N/A;  Sigmoid resection with End Colostomy.  . COLOSTOMY TAKEDOWN N/A 06/06/2013   Procedure: LAPAROSCOPIC OSTOMY TAKEDOWN AND ANASTOMOSIS ;  Surgeon: Ralene Ok, MD;  Location: [place];  Service: General;  Laterality: N/A;  . CORONARY ANGIOPLASTY WITH STENT PLACEMENT  06/2012   "1"  . ESOPHAGOGASTRODUODENOSCOPY (EGD) WITH PROPOFOL N/A 06/09/2019   Procedure: ESOPHAGOGASTRODUODENOSCOPY (EGD) WITH PROPOFOL;  Surgeon: [name], MD;  Location: [place];  Service: Endoscopy;  Laterality: N/A;  . INCISIONAL HERNIA REPAIR  12/31/2014   w/mesh  . INCISIONAL HERNIA REPAIR N/A 12/31/2014   Procedure: OPEN INCISIONAL HERNIA REPAIR WITH MESH (TAR PROCEDURE);  Surgeon: Ralene Ok, MD;  Location: [place];  Service: General;  Laterality: N/A;  . INSERTION OF MESH N/A 12/31/2014   Procedure: INSERTION OF MESH;  Surgeon: Ralene Ok, MD;  Location: [place];  Service: General;  Laterality: N/A;  . IR 3D INDEPENDENT WKST  01/18/2019  .  IR ANGIO INTRA EXTRACRAN SEL INTERNAL CAROTID BILAT MOD SED  01/18/2019  . IR ANGIO INTRA EXTRACRAN SEL INTERNAL CAROTID UNI L MOD SED  03/13/2019  . IR ANGIOGRAM EXTREMITY LEFT  01/18/2019  . IR ANGIOGRAM FOLLOW UP STUDY  03/13/2019  . IR TRANSCATH/EMBOLIZ  03/13/2019  . LAPAROTOMY  09/02/2012   Procedure: EXPLORATORY LAPAROTOMY;  Surgeon: Serafina Mitchell, MD;  Location: [facility] OR;  Service:  Vascular;  Laterality: N/A;  Exploratory Laparotomy with Sigmoid resection with end colocstomy.  Marland Kitchen RADIOLOGY WITH ANESTHESIA N/A 03/13/2019   Procedure: [employer];  Surgeon: Consuella Lose, MD;  Location: [place];  Service: Radiology;  Laterality: N/A;  . TONSILLECTOMY      (Not in a hospital admission)   SH: Social History   Tobacco Use  . Smoking status: Former Smoker    Packs/day: 0.10    Years: 45.00    Pack years: 4.50    Types: Cigarettes    Quit date: 06/08/2019    Years since quitting: 0.2  . Smokeless tobacco: Never Used  Substance Use Topics  . Alcohol use: No  . Drug use: No    MEDS: Prior to Admission medications   Medication Sig Start Date End Date Taking? Authorizing Provider  albuterol (VENTOLIN HFA) 108 (90 Base) MCG/ACT inhaler Inhale 2-4 puffs into the lungs every 6 (six) hours as needed for shortness of breath or wheezing. 12/11/18  Yes [provider]  aspirin EC 325 MG tablet Take 325 mg by mouth daily.    Yes [provider]  Cyanocobalamin (VITAMIN B-12 PO) Take 1 tablet by mouth daily.   Yes [provider]  digoxin (LANOXIN) 0.125 MG tablet TAKE 1 TABLET (125 MCG TOTAL) BY MOUTH DAILY. 06/25/19  Yes Park Liter, MD  Levomefolate Glucosamine (METHYLFOLATE PO) Take 1 tablet by mouth daily.   Yes [provider]  metoprolol tartrate (LOPRESSOR) 50 MG tablet TAKE 1 TABLET BY MOUTH EVERY DAY 09/03/19  Yes Park Liter, MD  pantoprazole (PROTONIX) 40 MG tablet Take 1 tablet (40 mg total) by mouth daily before breakfast. 09/02/19  Yes Esterwood, Amy S, PA-C  sertraline (ZOLOFT) 50 MG tablet Take 50 mg by mouth daily.   Yes [provider]  XARELTO 20 MG TABS tablet TAKE 1 TABLET (20 MG TOTAL) BY MOUTH DAILY WITH EVENING MEAL. 07/06/19  Yes Park Liter, MD  digoxin (LANOXIN) 0.25 MG tablet TAKE 1 TABLET BY MOUTH EVERY DAY 08/24/19   Park Liter, MD  ferrous sulfate 325 (65 FE) MG  tablet Take 1 tablet (325 mg total) by mouth daily. 06/13/19 09/11/19  Dessa Phi, DO  nitroGLYCERIN (NITROSTAT) 0.4 MG SL tablet Place 1 tablet (0.4 mg total) under the tongue every 5 (five) minutes as needed for chest pain. 01/15/19   Park Liter, MD    ALLERGY: Allergies  Allergen Reactions  . Beef-Derived Products Hives    Has to watch how much beef he eats  . Codeine Nausea And Vomiting  . Poison Ivy Extract [Poison Ivy Extract] Hives    Social History   Tobacco Use  . Smoking status: Former Smoker    Packs/day: 0.10    Years: 45.00    Pack years: 4.50    Types: Cigarettes    Quit date: 06/08/2019    Years since quitting: 0.2  . Smokeless tobacco: Never Used  Substance Use Topics  . Alcohol use: No     Family History  Problem Relation Age of Onset  .  Lymphoma Father   . Prostate cancer Paternal Grandfather   . Colon cancer Neg Hx      ROS   ROS  Exam   Vitals:   09/14/19 0710  BP: (!) 139/105  Pulse: 82  Resp: 16  Temp: 98.3 F (36.8 C)  SpO2: 99%   General appearance: WDWN, NAD Eyes: No scleral injection Cardiovascular: Regular rate and rhythm without murmurs, rubs, gallops. No edema or variciosities. Distal pulses normal. Pulmonary: Effort normal, non-labored breathing Musculoskeletal:     Muscle tone upper extremities: Normal    Muscle tone lower extremities: Normal    Motor exam: Upper Extremities Deltoid Bicep Tricep Grip  Right 5/5 5/5 5/5 5/5  Left 5/5 5/5 5/5 5/5   Lower Extremity IP Quad PF DF EHL  Right 5/5 5/5 5/5 5/5 5/5  Left 5/5 5/5 5/5 5/5 5/5   Neurological Mental Status:    - Patient is awake, alert, oriented to person, place, month, year, and situation    - Patient is able to give a clear and coherent history.    - No signs of aphasia or neglect Cranial Nerves    - II: Visual Fields are full. PERRL    - III/IV/VI: EOMI without ptosis or diploplia.     - V: Facial sensation is grossly normal    - VII: Facial  movement is symmetric.     - VIII: hearing is intact to voice    - X: Uvula elevates symmetrically    - XI: Shoulder shrug is symmetric.    - XII: tongue is midline without atrophy or fasciculations.  Sensory: Sensation grossly intact to LT  Results - Imaging/Labs   Results for orders placed or performed during the hospital encounter of 09/14/19 (from the past 48 hour(s))  CBC WITH DIFFERENTIAL     Status: Abnormal   Collection Time: 09/14/19  7:21 AM  Result Value Ref Range   WBC 4.6 4.0 - 10.5 K/uL   RBC 5.97 (H) 4.22 - 5.81 MIL/uL   Hemoglobin 17.6 (H) 13.0 - 17.0 g/dL   HCT 54.1 (H) 39.0 - 52.0 %   MCV 90.6 80.0 - 100.0 fL   MCH 29.5 26.0 - 34.0 pg   MCHC 32.5 30.0 - 36.0 g/dL   RDW 18.7 (H) 11.5 - 15.5 %   Platelets 167 150 - 400 K/uL   nRBC 0.0 0.0 - 0.2 %   Neutrophils Relative % 50 %   Neutro Abs 2.3 1.7 - 7.7 K/uL   Lymphocytes Relative 29 %   Lymphs Abs 1.3 0.7 - 4.0 K/uL   Monocytes Relative 16 %   Monocytes Absolute 0.7 0.1 - 1.0 K/uL   Eosinophils Relative 4 %   Eosinophils Absolute 0.2 0.0 - 0.5 K/uL   Basophils Relative 1 %   Basophils Absolute 0.0 0.0 - 0.1 K/uL   Immature Granulocytes 0 %   Abs Immature Granulocytes 0.01 0.00 - 0.07 K/uL    Comment: Performed at [facility], [address] [place], [place] [address]    ECHOCARDIOGRAM COMPLETE  Result Date: 09/12/2019   ECHOCARDIOGRAM REPORT   Patient Name:   Johnathan Bauer Date of Exam: 09/12/2019 Medical Rec #:  [id number]       Height:       72.0 in Accession #:    [id number]      Weight:       167.0 lb Date of Birth:  1951/09/04  BSA:          1.97 m Patient Age:    33 years        BP:           140/86 mmHg Patient Gender: M               HR:           75 bpm. Exam Location:  [facility] Procedure: 2D Echo Indications:    Coronary artery disease involving native coronary artery of                 native heart without angina pectoris [I25.10] - Primary                  Chronic atrial fibrillation  ([place]): Anticoagulated his chads 2                 Vas score equals 3 [I48.20]  History:        Patient has prior history of Echocardiogram examinations, most                 recent 08/15/2018. Arrythmias:Atrial Fibrillation; Risk                 Factors:Arteriosclerotic cardiovascular disease.  Sonographer:    Luane School Referring Phys: [place]  1. Left ventricular ejection fraction, by visual estimation, is 60 to 65%. The left ventricle has normal function. Left ventricular septal wall thickness was severely increased. Severely increased left ventricular posterior wall thickness. Severe concentric remodeling.There is no left ventricular hypertrophy.  2. Global right ventricle has normal systolic function.The right ventricular size is normal. No increase in right ventricular wall thickness.  3. Left atrial size was severely dilated.  4. Right atrial size was mildly dilated.  5. The mitral valve is normal in structure. Mild mitral valve regurgitation. No evidence of mitral stenosis.  6. The tricuspid valve is normal in structure.  7. The aortic valve is tricuspid. Aortic valve regurgitation is not visualized. No evidence of aortic valve sclerosis or stenosis.  8. The pulmonic valve was normal in structure. Pulmonic valve regurgitation is not visualized.  9. Normal pulmonary artery systolic pressure. 10. The inferior vena cava is normal in size with greater than 50% respiratory variability, suggesting right atrial pressure of 3 mmHg. 11. Left ventricular diastolic parameters are indeterminate. 12. The left ventricle has no regional wall motion abnormalities. FINDINGS  Left Ventricle: Left ventricular ejection fraction, by visual estimation, is 55 to 60%. The left ventricle has normal function. The left ventricle has no regional wall motion abnormalities. The left ventricular internal cavity size was the left ventricle is normal in size. Severely increased left ventricular posterior wall  thickness. There is no left ventricular hypertrophy. Left ventricular diastolic parameters are indeterminate. Normal left atrial pressure. Right Ventricle: The right ventricular size is normal. No increase in right ventricular wall thickness. Global RV systolic function is has normal systolic function. The tricuspid regurgitant velocity is 2.37 m/s, and with an assumed right atrial pressure  of 3 mmHg, the estimated right ventricular systolic pressure is normal at 25.5 mmHg. Left Atrium: Left atrial size was severely dilated. Right Atrium: Right atrial size was mildly dilated Pericardium: There is no evidence of pericardial effusion. Mitral Valve: The mitral valve is normal in structure. Mild mitral valve regurgitation. No evidence of mitral valve stenosis by observation. Tricuspid Valve: The tricuspid valve is normal in structure. Tricuspid valve regurgitation is mild. Aortic Valve:  The aortic valve is tricuspid. Aortic valve regurgitation is not visualized. The aortic valve is structurally normal, with no evidence of sclerosis or stenosis. Pulmonic Valve: The pulmonic valve was normal in structure. Pulmonic valve regurgitation is not visualized. Pulmonic regurgitation is not visualized. No evidence of pulmonic stenosis. Aorta: The aortic root and ascending aorta are structurally normal, with no evidence of dilitation. Venous: The pulmonary veins were not well visualized. The inferior vena cava is normal in size with greater than 50% respiratory variability, suggesting right atrial pressure of 3 mmHg. IAS/Shunts: No atrial level shunt detected by color flow Doppler. There is no evidence of a patent foramen ovale. No ventricular septal defect is seen or detected. There is no evidence of an atrial septal defect.  LEFT VENTRICLE PLAX 2D LVIDd:         3.60 cm  Diastology LVIDs:         2.90 cm  LV e' lateral:   11.40 cm/s LV PW:         1.65 cm  LV E/e' lateral: 8.4 LV IVS:        1.65 cm  LV e' medial:    8.70 cm/s  LVOT diam:     2.30 cm  LV E/e' medial:  11.0 LV SV:         22 ml LV SV Index:   11.33 LVOT Area:     4.15 cm  RIGHT VENTRICLE         IVC TAPSE (M-mode): 1.9 cm  IVC diam: 1.50 cm LEFT ATRIUM              Index       RIGHT ATRIUM           Index LA diam:        5.20 cm  2.64 cm/m  RA Area:     24.00 cm LA Vol (A2C):   124.0 ml 62.84 ml/m RA Volume:   74.20 ml  37.60 ml/m LA Vol (A4C):   128.0 ml 64.86 ml/m LA Biplane Vol: 136.0 ml 68.92 ml/m  AORTIC VALVE LVOT Vmax:   87.60 cm/s LVOT Vmean:  52.800 cm/s LVOT VTI:    0.157 m  AORTA Ao Root diam: 3.55 cm Ao Asc diam:  3.45 cm MITRAL VALVE                       TRICUSPID VALVE MV Area (PHT): 9.25 cm            TR Peak grad:   22.5 mmHg MV PHT:        23.78 msec          TR Vmax:        246.00 cm/s MV Decel Time: 82 msec MV E velocity: 96.00 cm/s 103 cm/s SHUNTS                                    Systemic VTI:  0.16 m                                    Systemic Diam: 2.30 cm  Shirlee More MD Electronically signed by Shirlee More MD Signature Date/Time: 09/12/2019/5:31:03 PM    Final    Impression/Plan   69 y.o. male  6 months status post pipeline embolization of a left  internal carotid artery aneurysm.   We will proceed with routine follow-up diagnostic cerebral angiogram for monitoring.  Risks, benefits and alternatives were discussed.  Patient stated understanding and wished to proceed.  Ferne Reus, PA-C [facility] [employer]

## 2019-09-14 NOTE — Brief Op Note (Signed)
  NEUROSURGERY BRIEF OPERATIVE  NOTE   PREOP DX: Cerebral aneurysm  POSTOP DX: Same  PROCEDURE: Diagnostic cerebral angiogram  SURGEON: Dr. Consuella Lose, MD  ANESTHESIA: IV Sedation with Local  EBL: Minimal  SPECIMENS: None  COMPLICATIONS: None  CONDITION: Stable to recovery  FINDINGS (Full report in [facility]): 1. No identified filling of previously described LICA aneurysm. No in-stent stenosis

## 2019-09-14 NOTE — H&P (Signed)
Chief Complaint   Left internal carotid artery aneurysm  HPI   HPI: ARJEN SIEGER is a 69 y.o. 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  He is maintained on aspirin, plavix was d/c at 3 months as he is also on Xarelto for atrial fibrillation.  He presents today for routine follow-up diagnostic cerebral angiogram for monitoring.  He is without any concerns.   Patient Active Problem List   Diagnosis Date Noted  . Absolute anemia   . Melena   . Erosive gastritis with hemorrhage   . UGIB (upper gastrointestinal bleed) 06/08/2019  . Cerebral aneurysm, nonruptured 03/13/2019  . Vision changes 01/10/2019  . Stroke-like symptoms 01/10/2019  . History of stroke 01/10/2019  . Blurry vision, bilateral 01/10/2019  . Dyspnea on exertion 07/08/2017  . History of colonic polyps 11/30/2016  . Chronic anticoagulation 11/30/2016  . Alcohol abuse 05/25/2016  . 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 Coronary artery disease involving native coronary artery of native heart without angina pectoris 05/20/2015  . Essential hypertension 05/20/2015  . S/P hernia repair 12/31/2014  . Respiratory failure, post-operative ([place]) 09/07/2012  . NSVT, 7 beat run 1/02 09/07/2012  . Chronic atrial fibrillation ([place]): Anticoagulated his chads 2 Vas score equals 3 09/03/2012  . 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 Perforated sigmoid colon ([place]) 09/03/2012  . Diverticulitis of sigmoid colon 09/02/2012    PMH: Past Medical History:  Diagnosis Date  . Anemia   . 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 Arthritis    "touch in my right thumb" (12/31/2014)  . Blood transfusion without reported diagnosis   . Chronic atrial fibrillation (HCC)    did not like coumadin  . Chronic kidney disease 09/2012   patiet denies 08/08/19  . Coronary  artery disease    s/p PCI Sept 2013 with DES OM  . Diverticulitis with perforation   . Dysrhythmia    A-fib  . GI bleed   . Pneumonia 09/2012  . Stroke [facility]) 04/2016    PSH: Past Surgical History:  Procedure Laterality Date  . BIOPSY  06/09/2019   Procedure: BIOPSY;  Surgeon: [name], MD;  Location: [facility] ENDOSCOPY;  Service: Endoscopy;;  . COLECTOMY WITH COLOSTOMY CREATION/HARTMANN PROCEDURE  2013  . COLONOSCOPY    . COLOSTOMY REVISION  09/02/2012   Procedure: COLON RESECTION SIGMOID;  Surgeon: Serafina Mitchell, MD;  [facility];  Service: Vascular;  Laterality: N/A;  Sigmoid resection with End Colostomy.  . COLOSTOMY TAKEDOWN N/A 06/06/2013   Procedure: LAPAROSCOPIC OSTOMY TAKEDOWN AND ANASTOMOSIS ;  Surgeon: Ralene Ok, MD;  Location: [place];  Service: General;  Laterality: N/A;  . CORONARY ANGIOPLASTY WITH STENT PLACEMENT  06/2012   "1"  . ESOPHAGOGASTRODUODENOSCOPY (EGD) WITH PROPOFOL N/A 06/09/2019   Procedure: ESOPHAGOGASTRODUODENOSCOPY (EGD) WITH PROPOFOL;  Surgeon: [name], MD;  Location: [place];  Service: Endoscopy;  Laterality: N/A;  . INCISIONAL HERNIA REPAIR  12/31/2014   w/mesh  . INCISIONAL HERNIA REPAIR N/A 12/31/2014   Procedure: OPEN INCISIONAL HERNIA REPAIR WITH MESH (TAR PROCEDURE);  Surgeon: Ralene Ok, MD;  Location: [place];  Service: General;  Laterality: N/A;  . INSERTION OF MESH N/A 12/31/2014   Procedure: INSERTION OF MESH;  Surgeon: Ralene Ok, MD;  Location: [place];  Service: General;  Laterality: N/A;  . IR 3D INDEPENDENT WKST  01/18/2019  .  IR ANGIO INTRA EXTRACRAN SEL INTERNAL CAROTID BILAT MOD SED  01/18/2019  . IR ANGIO INTRA EXTRACRAN SEL INTERNAL CAROTID UNI L MOD SED  03/13/2019  . IR ANGIOGRAM EXTREMITY LEFT  01/18/2019  . IR ANGIOGRAM FOLLOW UP STUDY  03/13/2019  . IR TRANSCATH/EMBOLIZ  03/13/2019  . LAPAROTOMY  09/02/2012   Procedure: EXPLORATORY LAPAROTOMY;  Surgeon: Serafina Mitchell, MD;  Location: [facility];  Service:  Vascular;  Laterality: N/A;  Exploratory Laparotomy with Sigmoid resection with end colocstomy.  Marland Kitchen RADIOLOGY WITH ANESTHESIA N/A 03/13/2019   Procedure: [employer];  Surgeon: Consuella Lose, MD;  Location: [place];  Service: Radiology;  Laterality: N/A;  . TONSILLECTOMY      (Not in a hospital admission)   SH: Social History   Tobacco Use  . Smoking status: Former Smoker    Packs/day: 0.10    Years: 45.00    Pack years: 4.50    Types: Cigarettes    Quit date: 06/08/2019    Years since quitting: 0.2  . Smokeless tobacco: Never Used  Substance Use Topics  . Alcohol use: No  . Drug use: No    MEDS: Prior to Admission medications   Medication Sig Start Date End Date Taking? Authorizing Provider  albuterol (VENTOLIN HFA) 108 (90 Base) MCG/ACT inhaler Inhale 2-4 puffs into the lungs every 6 (six) hours as needed for shortness of breath or wheezing. 12/11/18  Yes [provider]  aspirin EC 325 MG tablet Take 325 mg by mouth daily.    Yes [provider]  Cyanocobalamin (VITAMIN B-12 PO) Take 1 tablet by mouth daily.   Yes [provider]  digoxin (LANOXIN) 0.125 MG tablet TAKE 1 TABLET (125 MCG TOTAL) BY MOUTH DAILY. 06/25/19  Yes Park Liter, MD  Levomefolate Glucosamine (METHYLFOLATE PO) Take 1 tablet by mouth daily.   Yes [provider]  metoprolol tartrate (LOPRESSOR) 50 MG tablet TAKE 1 TABLET BY MOUTH EVERY DAY 09/03/19  Yes Park Liter, MD  pantoprazole (PROTONIX) 40 MG tablet Take 1 tablet (40 mg total) by mouth daily before breakfast. 09/02/19  Yes Esterwood, Amy S, PA-C  sertraline (ZOLOFT) 50 MG tablet Take 50 mg by mouth daily.   Yes [provider]  XARELTO 20 MG TABS tablet TAKE 1 TABLET (20 MG TOTAL) BY MOUTH DAILY WITH EVENING MEAL. 07/06/19  Yes Park Liter, MD  digoxin (LANOXIN) 0.25 MG tablet TAKE 1 TABLET BY MOUTH EVERY DAY 08/24/19   Park Liter, MD  ferrous sulfate 325 (65 FE) MG  tablet Take 1 tablet (325 mg total) by mouth daily. 06/13/19 09/11/19  Dessa Phi, DO  nitroGLYCERIN (NITROSTAT) 0.4 MG SL tablet Place 1 tablet (0.4 mg total) under the tongue every 5 (five) minutes as needed for chest pain. 01/15/19   Park Liter, MD    ALLERGY: Allergies  Allergen Reactions  . Beef-Derived Products Hives    Has to watch how much beef he eats  . Codeine Nausea And Vomiting  . Poison Ivy Extract [Poison Ivy Extract] Hives    Social History   Tobacco Use  . Smoking status: Former Smoker    Packs/day: 0.10    Years: 45.00    Pack years: 4.50    Types: Cigarettes    Quit date: 06/08/2019    Years since quitting: 0.2  . Smokeless tobacco: Never Used  Substance Use Topics  . Alcohol use: No     Family History  Problem Relation Age of Onset  .  Lymphoma Father   . Prostate cancer Paternal Grandfather   . Colon cancer Neg Hx      ROS   ROS  Exam   Vitals:   09/14/19 0710  BP: (!) 139/105  Pulse: 82  Resp: 16  Temp: 98.3 F (36.8 C)  SpO2: 99%   General appearance: WDWN, NAD Eyes: No scleral injection Cardiovascular: Regular rate and rhythm without murmurs, rubs, gallops. No edema or variciosities. Distal pulses normal. Pulmonary: Effort normal, non-labored breathing Musculoskeletal:     Muscle tone upper extremities: Normal    Muscle tone lower extremities: Normal    Motor exam: Upper Extremities Deltoid Bicep Tricep Grip  Right 5/5 5/5 5/5 5/5  Left 5/5 5/5 5/5 5/5   Lower Extremity IP Quad PF DF EHL  Right 5/5 5/5 5/5 5/5 5/5  Left 5/5 5/5 5/5 5/5 5/5   Neurological Mental Status:    - Patient is awake, alert, oriented to person, place, month, year, and situation    - Patient is able to give a clear and coherent history.    - No signs of aphasia or neglect Cranial Nerves    - II: Visual Fields are full. PERRL    - III/IV/VI: EOMI without ptosis or diploplia.     - V: Facial sensation is grossly normal    - VII: Facial  movement is symmetric.     - VIII: hearing is intact to voice    - X: Uvula elevates symmetrically    - XI: Shoulder shrug is symmetric.    - XII: tongue is midline without atrophy or fasciculations.  Sensory: Sensation grossly intact to LT  Results - Imaging/Labs   Results for orders placed or performed during the hospital encounter of 09/14/19 (from the past 48 hour(s))  APTT     Status: Abnormal   Collection Time: 09/14/19  7:21 AM  Result Value Ref Range   aPTT 40 (H) 24 - 36 seconds    Comment:        IF BASELINE aPTT IS ELEVATED, SUGGEST PATIENT RISK ASSESSMENT BE USED TO DETERMINE APPROPRIATE ANTICOAGULANT THERAPY. Performed at [facility], [address], [place], [place] [address]   CBC WITH DIFFERENTIAL     Status: Abnormal   Collection Time: 09/14/19  7:21 AM  Result Value Ref Range   WBC 4.6 4.0 - 10.5 K/uL   RBC 5.97 (H) 4.22 - 5.81 MIL/uL   Hemoglobin 17.6 (H) 13.0 - 17.0 g/dL   HCT 54.1 (H) 39.0 - 52.0 %   MCV 90.6 80.0 - 100.0 fL   MCH 29.5 26.0 - 34.0 pg   MCHC 32.5 30.0 - 36.0 g/dL   RDW 18.7 (H) 11.5 - 15.5 %   Platelets 167 150 - 400 K/uL   nRBC 0.0 0.0 - 0.2 %   Neutrophils Relative % 50 %   Neutro Abs 2.3 1.7 - 7.7 K/uL   Lymphocytes Relative 29 %   Lymphs Abs 1.3 0.7 - 4.0 K/uL   Monocytes Relative 16 %   Monocytes Absolute 0.7 0.1 - 1.0 K/uL   Eosinophils Relative 4 %   Eosinophils Absolute 0.2 0.0 - 0.5 K/uL   Basophils Relative 1 %   Basophils Absolute 0.0 0.0 - 0.1 K/uL   Immature Granulocytes 0 %   Abs Immature Granulocytes 0.01 0.00 - 0.07 K/uL    Comment: Performed at [facility], [address], [place], [place] [address]  Protime-INR     Status:  Abnormal   Collection Time: 09/14/19  7:21 AM  Result Value Ref Range   Prothrombin Time 24.3 (H) 11.4 - 15.2 seconds   INR 2.2 (H) 0.8 - 1.2    Comment: (NOTE) INR goal varies based on device and disease states. Performed at [facility], [place] [address]  [place], [place] [address]     ECHOCARDIOGRAM COMPLETE  Result Date: 09/12/2019   ECHOCARDIOGRAM REPORT   Patient Name:   NATRON NOVELLO Keehan Date of Exam: 09/12/2019 Medical Rec #:  [id number]       Height:       72.0 in Accession #:    [id number]      Weight:       167.0 lb Date of Birth:  [birth year]        BSA:          1.97 m Patient Age:    70 years        BP:           140/86 mmHg Patient Gender: M               HR:           75 bpm. Exam Location:  [place] Procedure: 2D Echo Indications:    Coronary artery disease involving native coronary artery of                 native heart without angina pectoris [I25.10] - Primary                  Chronic atrial fibrillation ([place]): Anticoagulated his chads 2                 Vas score equals 3 [I48.20]  History:        Patient has prior history of Echocardiogram examinations, most                 recent 08/15/2018. Arrythmias:Atrial Fibrillation; Risk                 Factors:Arteriosclerotic cardiovascular disease.  Sonographer:    Luane School Referring Phys: [place]  1. Left ventricular ejection fraction, by visual estimation, is 60 to 65%. The left ventricle has normal function. Left ventricular septal wall thickness was severely increased. Severely increased left ventricular posterior wall thickness. Severe concentric remodeling.There is no left ventricular hypertrophy.  2. Global right ventricle has normal systolic function.The right ventricular size is normal. No increase in right ventricular wall thickness.  3. Left atrial size was severely dilated.  4. Right atrial size was mildly dilated.  5. The mitral valve is normal in structure. Mild mitral valve regurgitation. No evidence of mitral stenosis.  6. The tricuspid valve is normal in structure.  7. The aortic valve is tricuspid. Aortic valve regurgitation is not visualized. No evidence of aortic valve sclerosis or stenosis.  8. The pulmonic valve was normal in structure. Pulmonic valve  regurgitation is not visualized.  9. Normal pulmonary artery systolic pressure. 10. The inferior vena cava is normal in size with greater than 50% respiratory variability, suggesting right atrial pressure of 3 mmHg. 11. Left ventricular diastolic parameters are indeterminate. 12. The left ventricle has no regional wall motion abnormalities. FINDINGS  Left Ventricle: Left ventricular ejection fraction, by visual estimation, is 55 to 60%. The left ventricle has normal function. The left ventricle has no regional wall motion abnormalities. The left ventricular internal cavity size was the left ventricle is normal in  size. Severely increased left ventricular posterior wall thickness. There is no left ventricular hypertrophy. Left ventricular diastolic parameters are indeterminate. Normal left atrial pressure. Right Ventricle: The right ventricular size is normal. No increase in right ventricular wall thickness. Global RV systolic function is has normal systolic function. The tricuspid regurgitant velocity is 2.37 m/s, and with an assumed right atrial pressure  of 3 mmHg, the estimated right ventricular systolic pressure is normal at 25.5 mmHg. Left Atrium: Left atrial size was severely dilated. Right Atrium: Right atrial size was mildly dilated Pericardium: There is no evidence of pericardial effusion. Mitral Valve: The mitral valve is normal in structure. Mild mitral valve regurgitation. No evidence of mitral valve stenosis by observation. Tricuspid Valve: The tricuspid valve is normal in structure. Tricuspid valve regurgitation is mild. Aortic Valve: The aortic valve is tricuspid. Aortic valve regurgitation is not visualized. The aortic valve is structurally normal, with no evidence of sclerosis or stenosis. Pulmonic Valve: The pulmonic valve was normal in structure. Pulmonic valve regurgitation is not visualized. Pulmonic regurgitation is not visualized. No evidence of pulmonic stenosis. Aorta: The aortic root and  ascending aorta are structurally normal, with no evidence of dilitation. Venous: The pulmonary veins were not well visualized. The inferior vena cava is normal in size with greater than 50% respiratory variability, suggesting right atrial pressure of 3 mmHg. IAS/Shunts: No atrial level shunt detected by color flow Doppler. There is no evidence of a patent foramen ovale. No ventricular septal defect is seen or detected. There is no evidence of an atrial septal defect.  LEFT VENTRICLE PLAX 2D LVIDd:         3.60 cm  Diastology LVIDs:         2.90 cm  LV e' lateral:   11.40 cm/s LV PW:         1.65 cm  LV E/e' lateral: 8.4 LV IVS:        1.65 cm  LV e' medial:    8.70 cm/s LVOT diam:     2.30 cm  LV E/e' medial:  11.0 LV SV:         22 ml LV SV Index:   11.33 LVOT Area:     4.15 cm  RIGHT VENTRICLE         IVC TAPSE (M-mode): 1.9 cm  IVC diam: 1.50 cm LEFT ATRIUM              Index       RIGHT ATRIUM           Index LA diam:        5.20 cm  2.64 cm/m  RA Area:     24.00 cm LA Vol (A2C):   124.0 ml 62.84 ml/m RA Volume:   74.20 ml  37.60 ml/m LA Vol (A4C):   128.0 ml 64.86 ml/m LA Biplane Vol: 136.0 ml 68.92 ml/m  AORTIC VALVE LVOT Vmax:   87.60 cm/s LVOT Vmean:  52.800 cm/s LVOT VTI:    0.157 m  AORTA Ao Root diam: 3.55 cm Ao Asc diam:  3.45 cm MITRAL VALVE                       TRICUSPID VALVE MV Area (PHT): 9.25 cm            TR Peak grad:   22.5 mmHg MV PHT:        23.78 msec          TR Vmax:  246.00 cm/s MV Decel Time: 82 msec MV E velocity: 96.00 cm/s 103 cm/s SHUNTS                                    Systemic VTI:  0.16 m                                    Systemic Diam: 2.30 cm  Shirlee More MD Electronically signed by Shirlee More MD Signature Date/Time: 09/12/2019/5:31:03 PM    Final    Impression/Plan   69 y.o. 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   We will proceed with routine follow-up diagnostic cerebral angiogram for  monitoring.  Risks, benefits and alternatives were discussed in the office.  Patient stated understanding and wished to proceed.  Consuella Lose, MD [facility]

## 2019-09-14 NOTE — Discharge Instructions (Addendum)
Radial Site Care  This sheet gives you information about how to care for yourself after your procedure. Your health care provider may also give you more specific instructions. If you have problems or questions, contact your health care provider. What can I expect after the procedure? After the procedure, it is common to have:  Bruising and tenderness at the catheter insertion area. Follow these instructions at home: Medicines  Take over-the-counter and prescription medicines only as told by your health care provider. Insertion site care  Follow instructions from your health care provider about how to take care of your insertion site. Make sure you: ? Wash your hands with soap and water before you change your bandage (dressing). If soap and water are not available, use hand sanitizer. ? Change your dressing as told by your health care provider. ? Leave stitches (sutures), skin glue, or adhesive strips in place. These skin closures may need to stay in place for 2 weeks or longer. If adhesive strip edges start to loosen and curl up, you may trim the loose edges. Do not remove adhesive strips completely unless your health care provider tells you to do that.  Check your insertion site every day for signs of infection. Check for: ? Redness, swelling, or pain. ? Fluid or blood. ? Pus or a bad smell. ? Warmth.  Do not take baths, swim, or use a hot tub until your health care provider approves.  You may shower 24-48 hours after the procedure, or as directed by your health care provider. ? Remove the dressing and gently wash the site with plain soap and water. ? Pat the area dry with a clean towel. ? Do not rub the site. That could cause bleeding.  Do not apply powder or lotion to the site. Activity   For 24 hours after the procedure, or as directed by your health care provider: ? Do not flex or bend the affected arm. ? Do not push or pull heavy objects with the affected arm. ? Do not  drive yourself home from the hospital or clinic. You may drive 24 hours after the procedure unless your health care provider tells you not to. ? Do not operate machinery or power tools.  Do not lift anything that is heavier than 10 lb (4.5 kg), or the limit that you are told, until your health care provider says that it is safe.  Ask your health care provider when it is okay to: ? Return to work or school. ? Resume usual physical activities or sports. ? Resume sexual activity. General instructions  If the catheter site starts to bleed, raise your arm and put firm pressure on the site. If the bleeding does not stop, get help right away. This is a medical emergency.  If you went home on the same day as your procedure, a responsible adult should be with you for the first 24 hours after you arrive home.  Keep all follow-up visits as told by your health care provider. This is important. Contact a health care provider if:  You have a fever.  You have redness, swelling, or yellow drainage around your insertion site. Get help right away if:  You have unusual pain at the radial site.  The catheter insertion area swells very fast.  The insertion area is bleeding, and the bleeding does not stop when you hold steady pressure on the area.  Your arm or hand becomes pale, cool, tingly, or numb. These symptoms may represent a serious problem   that is an emergency. Do not wait to see if the symptoms will go away. Get medical help right away. Call your local emergency services (911 in the [place] Do not drive yourself to the hospital. Summary  After the procedure, it is common to have bruising and tenderness at the site.  Follow instructions from your health care provider about how to take care of your radial site wound. Check the wound every day for signs of infection.  Do not lift anything that is heavier than 10 lb (4.5 kg), or the limit that you are told, until your health care provider says  that it is safe. This information is not intended to replace advice given to you by your health care provider. Make sure you discuss any questions you have with your health care provider. Document Revised: 09/28/2017 Document Reviewed: 09/28/2017 Elsevier Patient Education  2020 [employer] Cerebral Angiogram, Care After This sheet gives you information about how to care for yourself after your procedure. Your health care provider may also give you more specific instructions. If you have problems or questions, contact your health care provider. What can I expect after the procedure? After the procedure, it is common to have:  Bruising and tenderness at the catheter insertion site.  A mild headache. Follow these instructions at home: Insertion site care  Follow instructions from your health care provider about how to take care of the insertion site. Make sure you: ? Wash your hands with soap and water before and after you change your bandage (dressing). If soap and water are not available, use hand sanitizer. ? Change your dressing as told by your health care provider.  Do not take baths, swim, or use a hot tub until your health care provider approves. You may shower 24-48 hours after the procedure, or as told by your health care provider.  To clean your insertion site: ? Gently wash the site with plain soap and water. ? Pat the area dry with a clean towel. ? Do not rub the site. This may cause bleeding.  Do not apply powder or lotion to the site. Keep the site clean and dry. Infection signs Check your incision area every day for signs of infection. Check for:  Redness, swelling, or pain.  Fluid or blood.  Warmth.  Pus or a bad smell.  Activity  Do not drive for 24 hours if you were given a sedative during your procedure.  Rest as told by your health care provider.  Do not lift anything that is heavier than 10 lb (4.5 kg), or the limit that you are told, until your  health care provider says that it is safe.  Return to your normal activities as told by your health care provider, usually in about a week. Ask your health care provider what activities are safe for you. General instructions   If your insertion site starts to bleed, lie flat and put pressure on the site. If the bleeding does not stop, get help right away. This is a medical emergency.  Do not use any products that contain nicotine or tobacco, such as cigarettes, e-cigarettes, and chewing tobacco. If you need help quitting, ask your health care provider.  Take over-the-counter and prescription medicines only as told by your health care provider.  Drink enough fluid to keep your urine pale yellow. This helps flush the contrast dye from your body.  Keep all follow-up visits as directed by your health care provider. This is important. Contact a health   care provider if:  You have a fever or chills.  You have redness, swelling, or pain around your insertion site.  You have fluid or blood coming from your insertion site.  The insertion site feels warm to the touch.  You have pus or a bad smell coming from your insertion site.  You notice blood collecting in the tissue around the insertion site (hematoma). The hematoma may be painful to the touch. Get help right away if:  You have chest pain or trouble breathing.  You have severe pain or swelling at the insertion site.  The insertion area bleeds, and bleeding continues after 30 minutes of holding steady pressure on the site.  The arm or leg where the catheter was inserted is pale, cold, numb, tingling, or weak.  You have a rash.  You have any symptoms of a stroke. "BE FAST" is an easy way to remember the main warning signs of a stroke: ? B - Balance. Signs are dizziness, sudden trouble walking, or loss of balance. ? E - Eyes. Signs are trouble seeing or a sudden change in vision. ? F - Face. Signs are sudden weakness or numbness of  the face, or the face or eyelid drooping on one side. ? A - Arms. Signs are weakness or numbness in an arm. This happens suddenly and usually on one side of the body. ? S - Speech. Signs are sudden trouble speaking, slurred speech, or trouble understanding what people say. ? T - Time. Time to call emergency services. Write down what time symptoms started.  You have other signs of a stroke, such as: ? A sudden, severe headache with no known cause. ? Nausea or vomiting. ? Seizure. These symptoms may represent a serious problem that is an emergency. Do not wait to see if the symptoms will go away. Get medical help right away. Call your local emergency services (911 in the [place] Do not drive yourself to the hospital. Summary  Bruising and tenderness at the insertion site are common.  Follow your health care provider's instructions about caring for your insertion site. Change dressing and clean the area as instructed.  If your insertion site bleeds, apply direct pressure until bleeding stops.  Return to your normal activities as told by your health care provider. Ask what activities are safe.  Rest and drink plenty of fluids. This information is not intended to replace advice given to you by your health care provider. Make sure you discuss any questions you have with your health care provider. Document Revised: 03/13/2019 Document Reviewed: 03/13/2019 Elsevier Patient Education  2020 [employer] Moderate Conscious Sedation, Adult Sedation is the use of medicines to promote relaxation and relieve discomfort and anxiety. Moderate conscious sedation is a type of sedation. Under moderate conscious sedation, you are less alert than normal, but you are still able to respond to instructions, touch, or both. Moderate conscious sedation is used during short medical and dental procedures. It is milder than deep sedation, which is a type of sedation under which you cannot be easily woken up. It is also  milder than general anesthesia, which is the use of medicines to make you unconscious. Moderate conscious sedation allows you to return to your regular activities sooner. Tell a health care provider about:  Any allergies you have.  All medicines you are taking, including vitamins, herbs, eye drops, creams, and over-the-counter medicines.  Use of steroids (by mouth or creams).  Any problems you or family members have had with   sedatives and anesthetic medicines.  Any blood disorders you have.  Any surgeries you have had.  Any medical conditions you have, such as sleep apnea.  Whether you are pregnant or may be pregnant.  Any use of cigarettes, alcohol, marijuana, or street drugs. What are the risks? Generally, this is a safe procedure. However, problems may occur, including:  Getting too much medicine (oversedation).  Nausea.  Allergic reaction to medicines.  Trouble breathing. If this happens, a breathing tube may be used to help with breathing. It will be removed when you are awake and breathing on your own.  Heart trouble.  Lung trouble. What happens before the procedure? Staying hydrated Follow instructions from your health care provider about hydration, which may include:  Up to 2 hours before the procedure - you may continue to drink clear liquids, such as water, clear fruit juice, black coffee, and plain tea. Eating and drinking restrictions Follow instructions from your health care provider about eating and drinking, which may include:  8 hours before the procedure - stop eating heavy meals or foods such as meat, fried foods, or fatty foods.  6 hours before the procedure - stop eating light meals or foods, such as toast or cereal.  6 hours before the procedure - stop drinking milk or drinks that contain milk.  2 hours before the procedure - stop drinking clear liquids. Medicine Ask your health care provider about:  Changing or stopping your regular medicines.  This is especially important if you are taking diabetes medicines or blood thinners.  Taking medicines such as aspirin and ibuprofen. These medicines can thin your blood. Do not take these medicines before your procedure if your health care provider instructs you not to.  Tests and exams  You will have a physical exam.  You may have blood tests done to show: ? How well your kidneys and liver are working. ? How well your blood can clot. General instructions  Plan to have someone take you home from the hospital or clinic.  If you will be going home right after the procedure, plan to have someone with you for 24 hours. What happens during the procedure?  An IV tube will be inserted into one of your veins.  Medicine to help you relax (sedative) will be given through the IV tube.  The medical or dental procedure will be performed. What happens after the procedure?  Your blood pressure, heart rate, breathing rate, and blood oxygen level will be monitored often until the medicines you were given have worn off.  Do not drive for 24 hours. This information is not intended to replace advice given to you by your health care provider. Make sure you discuss any questions you have with your health care provider. Document Revised: 08/05/2017 Document Reviewed: 12/13/2015 Elsevier Patient Education  2020 [employer]  

## 2019-09-19 ENCOUNTER — Telehealth: Payer: Self-pay

## 2019-09-19 NOTE — Telephone Encounter (Signed)
-----   Message from Park Liter, MD sent at 09/18/2019  7:37 PM EST ----- Normal left ventricle ejection fraction severe left ventricle hypertrophy, left atrial and right atrium significantly dilated.  Mild MR

## 2019-09-19 NOTE — Telephone Encounter (Signed)
The patient has been notified of the Echo result and verbalized understanding.  All questions (if any) were answered. Frederik Schmidt, RN 09/19/2019 4:00 PM

## 2019-10-17 DIAGNOSIS — I671 Cerebral aneurysm, nonruptured: Secondary | ICD-10-CM | POA: Diagnosis not present

## 2019-10-17 IMAGING — CT CT HEAD WITHOUT CONTRAST
4 series · 16 of 47 positions shown, 18 images · non-contrast
Comparison: CTA head neck 01/10/2019

CLINICAL DATA: Encephalopathy

EXAM:
CT HEAD WITHOUT CONTRAST
TECHNIQUE: Contiguous axial images were obtained from the base of the skull
through the vertex without intravenous contrast.

[Series 3: head wo · axial · 0.44mm/px · z∈[-110,+14]mm · 7 of 35 slices shown, 9 images]
[im 5/35  brain]
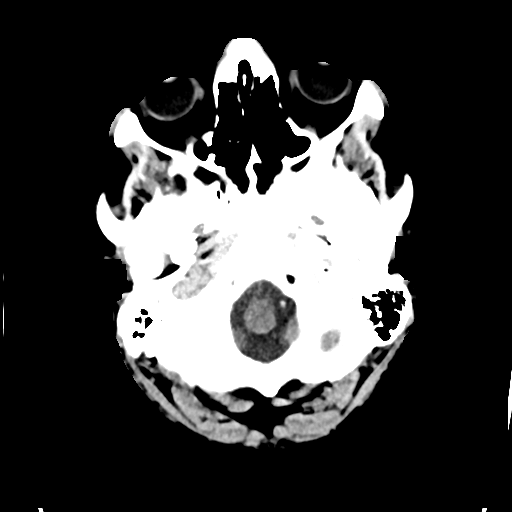
[im 5/35  bone]
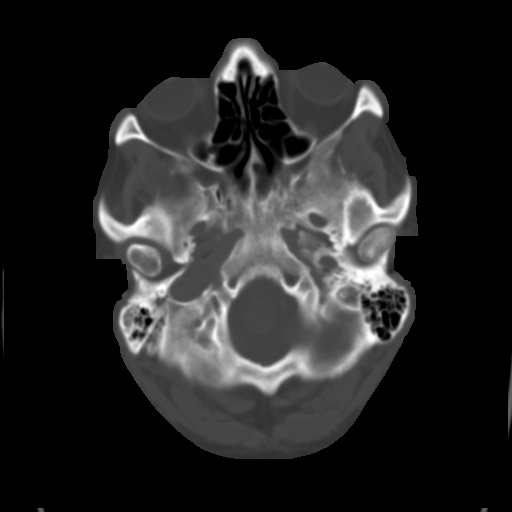
[im 9/35  brain]
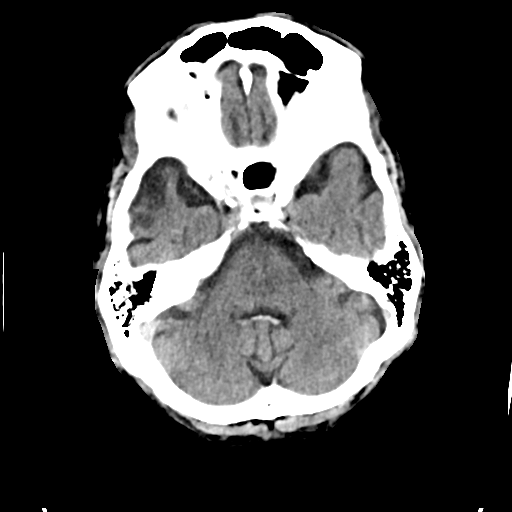
[im 13/35  brain]
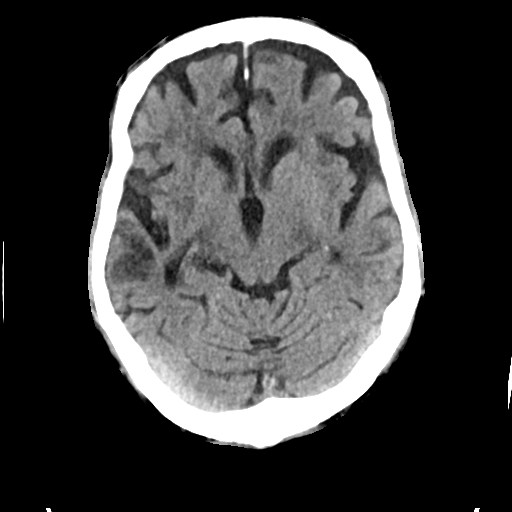
[im 18/35  brain]
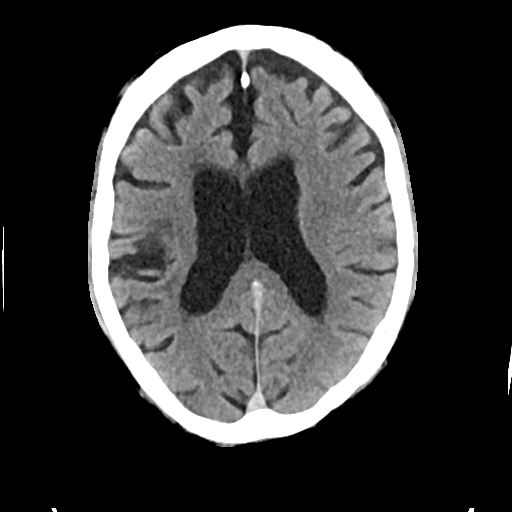
[im 22/35  brain]
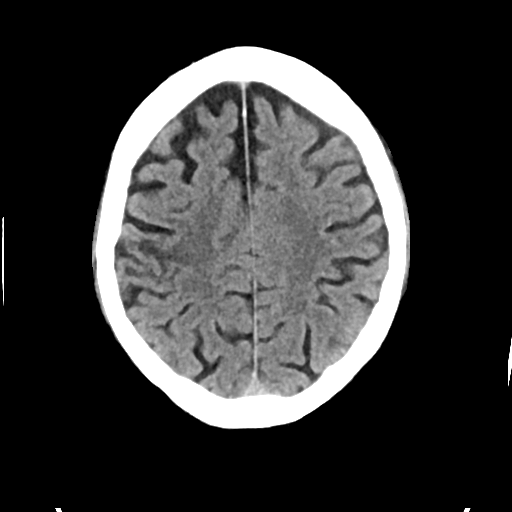
[im 22/35  bone]
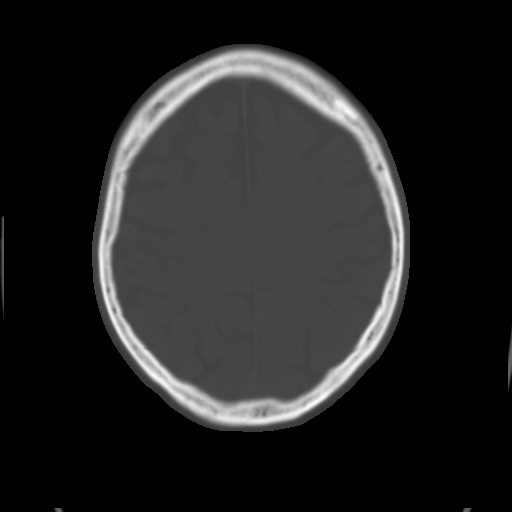
[im 26/35  brain]
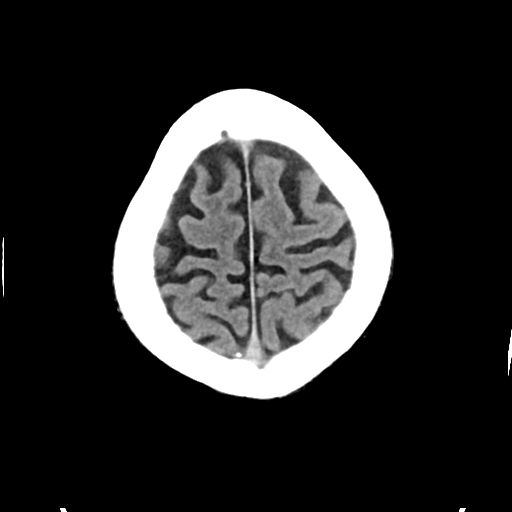
[im 30/35  brain]
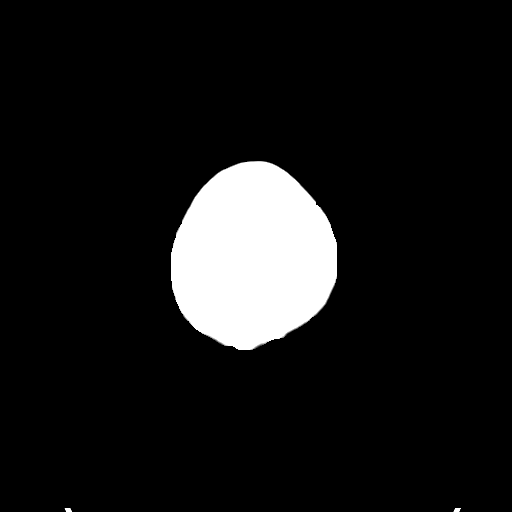

[Series 4: head bone · axial · 0.44mm/px · z∈[-114,-80]mm · 3 of 86 slices shown]
[im 9/86  bone]
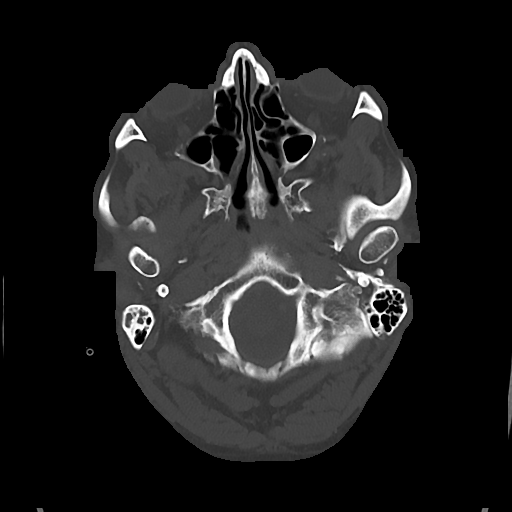
[im 18/86  bone]
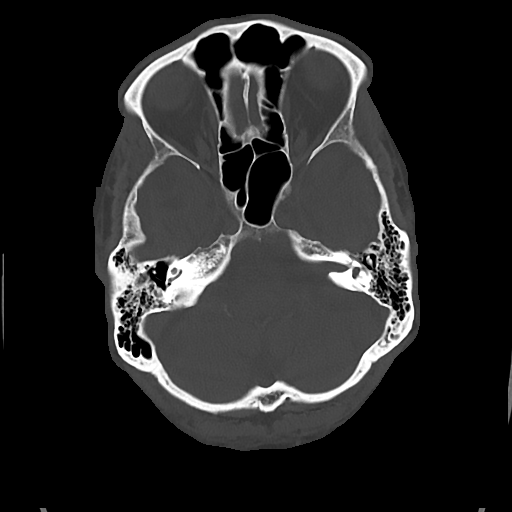
[im 26/86  bone]
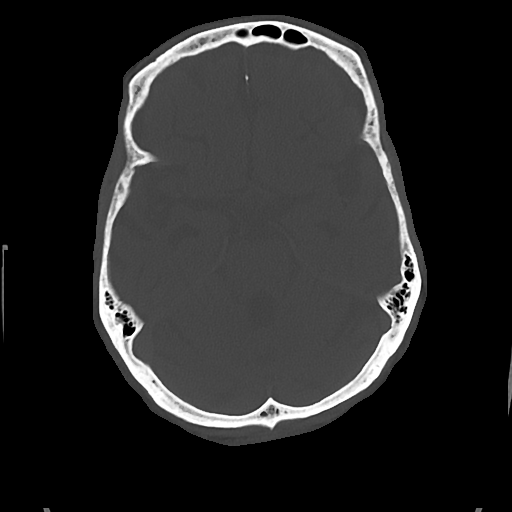

[Series 5: cor soft · coronal · 0.33mm/px · 3 of 73 slices shown]
[im 29/73  brain]
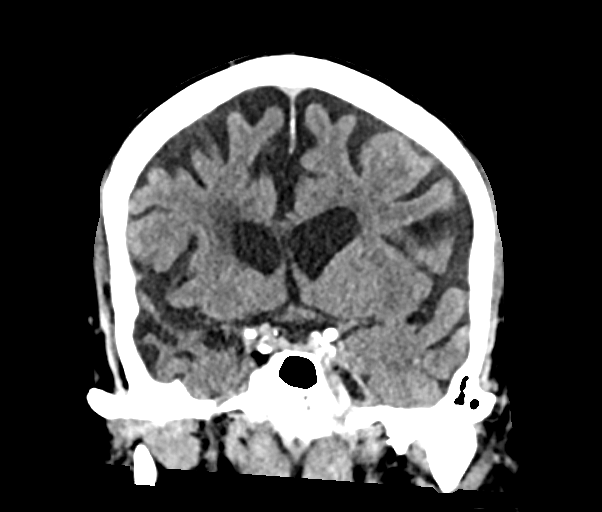
[im 34/73  brain]
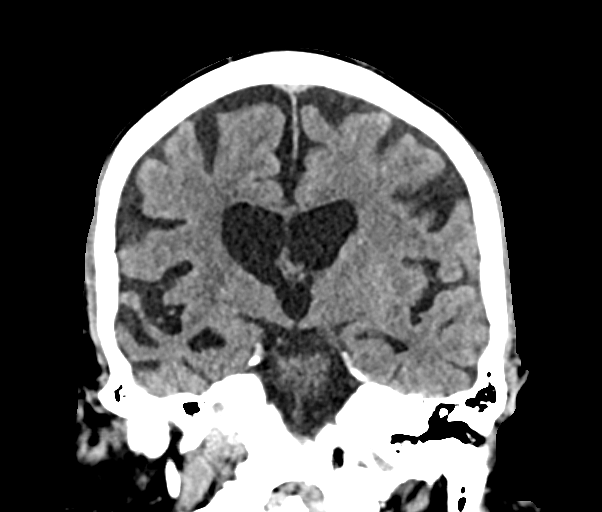
[im 39/73  brain]
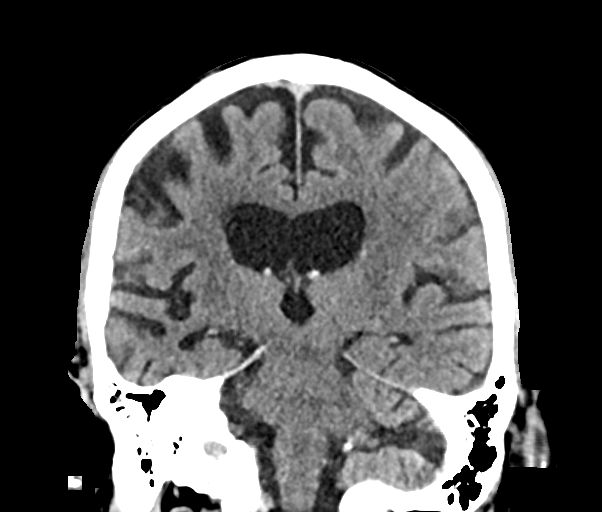

[Series 6: sag soft · sagittal · 0.33mm/px · 3 of 65 slices shown]
[im 23/65  brain]
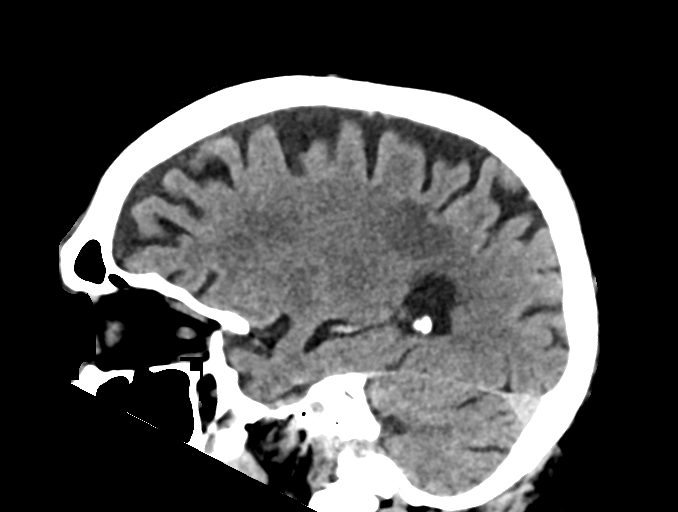
[im 33/65  brain]
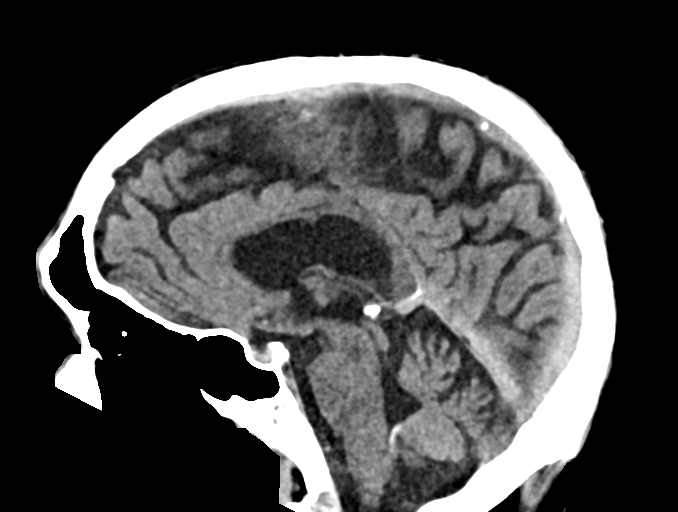
[im 42/65  brain]
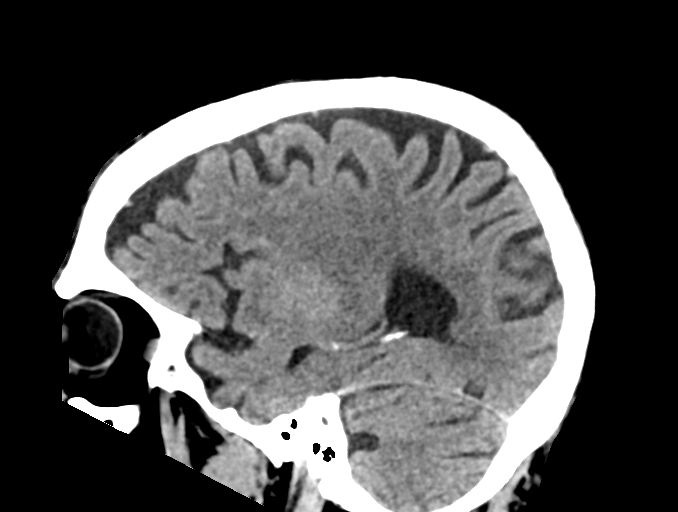

[16 of 47 positions shown; findings below may reference images not displayed]

FINDINGS: Brain: Old right frontoparietal subcortical infarct. Mild findings
of chronic small vessel disease. No intracranial hemorrhage or other
extra-axial collection. No midline shift or other mass effect.

Vascular: There is a stent of the left ICA terminus. Nearby
hyperdensity measuring 6 mm is unchanged.

Skull: The visualized skull base, calvarium and extracranial soft
tissues are normal.

Sinuses/Orbits: No fluid levels or advanced mucosal thickening of
the visualized paranasal sinuses. No mastoid or middle ear effusion.
The orbits are normal.
IMPRESSION: 1. No acute intracranial abnormality.
2. Left ICA stent at the site of treated aneurysm.

## 2019-10-19 ENCOUNTER — Encounter: Payer: Self-pay | Admitting: Cardiology

## 2019-10-19 ENCOUNTER — Other Ambulatory Visit: Payer: Self-pay

## 2019-10-19 ENCOUNTER — Ambulatory Visit (INDEPENDENT_AMBULATORY_CARE_PROVIDER_SITE_OTHER): Payer: PPO | Admitting: Cardiology

## 2019-10-19 VITALS — BP 100/70 | HR 91 | Ht 72.0 in | Wt 173.0 lb

## 2019-10-19 DIAGNOSIS — I2510 Atherosclerotic heart disease of native coronary artery without angina pectoris: Secondary | ICD-10-CM | POA: Diagnosis not present

## 2019-10-19 DIAGNOSIS — I482 Chronic atrial fibrillation, unspecified: Secondary | ICD-10-CM | POA: Diagnosis not present

## 2019-10-19 DIAGNOSIS — I6990 Unspecified sequelae of unspecified cerebrovascular disease: Secondary | ICD-10-CM

## 2019-10-19 NOTE — Progress Notes (Signed)
Cardiology Office Note:    Date:  10/19/2019   ID:  Johnathan Bauer, DOB 05-14-1951, MRN [id number]  PCP:  Enid Skeens., MD  Cardiologist:  Jenne Campus, MD    Referring MD: Enid Skeens., MD   Chief Complaint  Patient presents with  . Follow-up    History of Present Illness:    Johnathan Bauer is a 69 y.o. male  hx of hx of CAD, CVA, brain aneurysmstatus post pipeline embolization of the LICA(stenting), chronic atrial fibrillation on anticoagulation.  Recently he ended up going to hospital because of severe anemia that required blood transfusion also iron B12 shots.  He comes today to my office for follow-up.  Overall he is doing well.  He denies have any chest pain tightness squeezing pressure burning chest.  His blood count is holding quite well biggest complaint he has is the fact that he got poor teeth and would like to have some implants done.  His ask me about potentially interrupting his anticoagulation for short.  Time.  Past Medical History:  Diagnosis Date  . Anemia   . 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 Arthritis    "touch in my right thumb" (12/31/2014)  . Blood transfusion without reported diagnosis   . Chronic atrial fibrillation (HCC)    did not like coumadin  . Chronic kidney disease 09/2012   patiet denies 08/08/19  . Coronary artery disease    s/p PCI Sept 2013 with DES OM  . Diverticulitis with perforation   . Dysrhythmia    A-fib  . GI bleed   . Pneumonia 09/2012  . Stroke [facility]) 04/2016    Past Surgical History:  Procedure Laterality Date  . BIOPSY  06/09/2019   Procedure: BIOPSY;  Surgeon: [name], MD;  Location: [facility] ENDOSCOPY;  Service: Endoscopy;;  . COLECTOMY WITH COLOSTOMY CREATION/HARTMANN PROCEDURE  2013  . COLONOSCOPY    . COLOSTOMY REVISION  09/02/2012   Procedure: COLON RESECTION SIGMOID;  Surgeon: Serafina Mitchell, MD;  Location: [facility] OR;  Service: Vascular;  Laterality: N/A;  Sigmoid resection  with End Colostomy.  . COLOSTOMY TAKEDOWN N/A 06/06/2013   Procedure: LAPAROSCOPIC OSTOMY TAKEDOWN AND ANASTOMOSIS ;  Surgeon: Ralene Ok, MD;  Location: [place];  Service: General;  Laterality: N/A;  . CORONARY ANGIOPLASTY WITH STENT PLACEMENT  06/2012   "1"  . ESOPHAGOGASTRODUODENOSCOPY (EGD) WITH PROPOFOL N/A 06/09/2019   Procedure: ESOPHAGOGASTRODUODENOSCOPY (EGD) WITH PROPOFOL;  Surgeon: [name], MD;  Location: [place];  Service: Endoscopy;  Laterality: N/A;  . INCISIONAL HERNIA REPAIR  12/31/2014   w/mesh  . INCISIONAL HERNIA REPAIR N/A 12/31/2014   Procedure: OPEN INCISIONAL HERNIA REPAIR WITH MESH (TAR PROCEDURE);  Surgeon: Ralene Ok, MD;  Location: [place];  Service: General;  Laterality: N/A;  . INSERTION OF MESH N/A 12/31/2014   Procedure: INSERTION OF MESH;  Surgeon: Ralene Ok, MD;  Location: [place];  Service: General;  Laterality: N/A;  . IR 3D INDEPENDENT WKST  01/18/2019  . IR ANGIO INTRA EXTRACRAN SEL INTERNAL CAROTID BILAT MOD SED  01/18/2019  . IR ANGIO INTRA EXTRACRAN SEL INTERNAL CAROTID UNI L MOD SED  03/13/2019  . IR ANGIO INTRA EXTRACRAN SEL INTERNAL CAROTID UNI L MOD SED  09/14/2019  . IR ANGIOGRAM EXTREMITY LEFT  01/18/2019  . IR ANGIOGRAM FOLLOW UP STUDY  03/13/2019  . IR TRANSCATH/EMBOLIZ  03/13/2019  . IR US GUIDE VASC ACCESS RIGHT  09/14/2019  .  LAPAROTOMY  09/02/2012   Procedure: EXPLORATORY LAPAROTOMY;  Surgeon: Serafina Mitchell, MD;  Location: [facility] OR;  Service: Vascular;  Laterality: N/A;  Exploratory Laparotomy with Sigmoid resection with end colocstomy.  Marland Kitchen RADIOLOGY WITH ANESTHESIA N/A 03/13/2019   Procedure: P[employer];  Surgeon: Consuella Lose, MD;  Location: [place];  Service: Radiology;  Laterality: N/A;  . TONSILLECTOMY      Current Medications: Current Meds  Medication Sig  . albuterol (VENTOLIN HFA) 108 (90 Base) MCG/ACT inhaler Inhale 2-4 puffs into the lungs every 6 (six) hours as needed for shortness of breath or wheezing.  Marland Kitchen  aspirin EC 81 MG tablet Take 81 mg by mouth daily.  . digoxin (LANOXIN) 0.25 MG tablet TAKE 1 TABLET BY MOUTH EVERY DAY  . ferrous sulfate 325 (65 FE) MG tablet Take 1 tablet (325 mg total) by mouth daily.  . metoprolol tartrate (LOPRESSOR) 50 MG tablet TAKE 1 TABLET BY MOUTH EVERY DAY  . nitroGLYCERIN (NITROSTAT) 0.4 MG SL tablet Place 1 tablet (0.4 mg total) under the tongue every 5 (five) minutes as needed for chest pain.  . pantoprazole (PROTONIX) 40 MG tablet Take 1 tablet (40 mg total) by mouth daily before breakfast.  . sertraline (ZOLOFT) 50 MG tablet Take 50 mg by mouth daily.  Alveda Reasons 20 MG TABS tablet TAKE 1 TABLET (20 MG TOTAL) BY MOUTH DAILY WITH EVENING MEAL.  . [DISCONTINUED] Cyanocobalamin (VITAMIN B-12 PO) Take 1 tablet by mouth daily.  . [DISCONTINUED] digoxin (LANOXIN) 0.125 MG tablet TAKE 1 TABLET (125 MCG TOTAL) BY MOUTH DAILY.     Allergies:   Beef-derived products, Codeine, and Poison ivy extract [poison ivy extract]   Social History   Socioeconomic History  . Marital status: Married    Spouse name: Not on file  . Number of children: 2  . Years of education: Not on file  . Highest education level: Not on file  Occupational History  . Occupation: retired  Tobacco Use  . Smoking status: Current Every Day Smoker    Packs/day: 0.10    Years: 45.00    Pack years: 4.50    Types: Cigarettes    Last attempt to quit: 06/08/2019    Years since quitting: 0.3  . Smokeless tobacco: Never Used  Substance and Sexual Activity  . Alcohol use: No  . Drug use: No  . Sexual activity: Yes  Other Topics Concern  . 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 Difficulty of Paying Living Expenses: Not on file  Food Insecurity:    . Worried About Charity fundraiser in the Last Year: Not on file  . Ran Out of Food in the Last Year: Not on file  Transportation Needs:   . Lack of Transportation (Medical): Not on file  . Lack of Transportation (Non-Medical): Not on file  Physical Activity:   . Days of Exercise per Week: Not on file  . Minutes of Exercise per Session: Not on file  Stress:   . Feeling of Stress : Not on file  Social Connections:   . Frequency of Communication with Friends and Family: Not on file  . Frequency of Social Gatherings with Friends and Family: Not on file  .  Attends Religious Services: Not on file  . Active Member of Clubs or Organizations: Not on file  . Attends Archivist Meetings: Not on file  . Marital Status: Not on file     Family History: The patient's family history includes Lymphoma in his father; Prostate cancer in his paternal grandfather. There is no history of Colon cancer. ROS:   Please see the history of present illness.    All 14 point review of systems negative except as described per history of present illness  EKGs/Labs/Other Studies Reviewed:      Recent Labs: 06/09/2019: ALT 14; TSH 3.911 06/10/2019: Magnesium 1.8 09/14/2019: BUN 16; Creatinine, Ser 1.18; Hemoglobin 17.6; Platelets 167; Potassium 4.7; Sodium 139  Recent Lipid Panel    Component Value Date/Time   CHOL 152 06/23/2018 1605   TRIG 83 06/23/2018 1605   HDL 65 06/23/2018 1605   CHOLHDL 2.3 06/23/2018 1605   LDLCALC 70 06/23/2018 1605    Physical Exam:    VS:  BP 100/70 (BP Location: Left Arm, Patient Position: Sitting, Cuff Size: Normal)   Pulse 91   Ht 6' (1.829 m)   Wt 173 lb (78.5 kg)   SpO2 95%   BMI 23.46 kg/m     Wt Readings from Last 3 Encounters:  10/19/19 173 lb (78.5 kg)  09/14/19 170 lb (77.1 kg)  08/08/19 167 lb (75.8 kg)     GEN:  Well nourished, well developed in no acute distress HEENT: Normal NECK: No JVD; No carotid bruits LYMPHATICS: No  lymphadenopathy CARDIAC: Irregularly irregular, no murmurs, no rubs, no gallops RESPIRATORY:  Clear to auscultation without rales, wheezing or rhonchi  ABDOMEN: Soft, non-tender, non-distended MUSCULOSKELETAL:  No edema; No deformity  SKIN: Warm and dry LOWER EXTREMITIES: no swelling NEUROLOGIC:  Alert and oriented x 3 PSYCHIATRIC:  Normal affect   ASSESSMENT:    1. Chronic atrial fibrillation ([place]): Anticoagulated his chads 2 Vas score equals 3   2. Coronary artery disease involving native coronary artery of native heart without angina pectoris   3. 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 Chronic atrial fibrillation rate controlled continue anticoagulation.  His CHA2DS2-VASc score is 4 now. 2. Coronary artery disease, status post PTCA and stenting done in September 2013 with drug-eluting stent to obtuse marginal branch.  Stable on appropriate medications which I will continue. 3. Late effect of CVA.  Doing well from that point review.  Denies having any new issues. 4. Need for dental work-up.  From my standpoint if you we can hold his anticoagulation for 24 to 48 hours.  However that need to be also clarified with the neurosurgeons.   Medication Adjustments/Labs and Tests Ordered: Current medicines are reviewed at length with the patient today.  Concerns regarding medicines are outlined above.  No orders of the defined types were placed in this encounter.  Medication changes: No orders of the defined types were placed in this encounter.   Signed, Park Liter, MD, [facility] 10/19/2019 12:32 PM    [place]

## 2019-10-19 NOTE — Patient Instructions (Signed)

## 2019-10-26 ENCOUNTER — Telehealth: Payer: Self-pay | Admitting: Cardiology

## 2019-10-26 NOTE — Telephone Encounter (Signed)
New Message  Patient is calling in to speak with Dr. Agustin Cree or his nurse about his dental work. States he was supposed to get a call back about it after his appointment with Dr. Raliegh Ip on 10/19/19 and has not heard anything back about it. Please give patient a call back to discuss.

## 2019-10-26 NOTE — Telephone Encounter (Signed)
Called patient. He needs a couple teeth pulled and implants placed. He will need advisement on holding his xarelto. Will consult with Dr. Agustin Cree.

## 2019-10-29 DIAGNOSIS — L821 Other seborrheic keratosis: Secondary | ICD-10-CM | POA: Diagnosis not present

## 2019-10-29 DIAGNOSIS — L578 Other skin changes due to chronic exposure to nonionizing radiation: Secondary | ICD-10-CM | POA: Diagnosis not present

## 2019-10-29 DIAGNOSIS — L570 Actinic keratosis: Secondary | ICD-10-CM | POA: Diagnosis not present

## 2019-10-29 NOTE — Telephone Encounter (Signed)
Called patient informed him of Dr. Wendy Poet recommendation to hold xarelto 24-48 hours before procedure, also informed patient he should clarify with neurosurgeon as well. He verbally understood. No further questions.

## 2019-10-29 NOTE — Telephone Encounter (Signed)
From my point of view it should be acceptable to heart Xarelto for 24 to 48 hours, however, that also need to be clarified with the neurosurgeon.  He did have embolectomy done in his aneurysm.

## 2019-11-12 ENCOUNTER — Telehealth: Payer: Self-pay | Admitting: Cardiology

## 2019-11-12 NOTE — Telephone Encounter (Addendum)
Patient with diagnosis of atrial fibrillation on Xarelto for anticoagulation.    Procedure: Tooth extraction x4 Date of procedure: TBD  CHADS2-VASc score of 5 (HTN, AGE, stroke/tia x 2, CAD)  CrCl 66.5 ml/min Platelet count 167  Per office protocol, patient can hold Xarelto for 1 day prior to procedure. With history of CVA, would not recommend holding 3 days as requested. Resume as soon as safely possible after procedure, ideally within 24 hours.

## 2019-11-12 NOTE — Telephone Encounter (Signed)
New Message  1. What dental office are you calling from? [facility]   2. What is your office phone number? [phone]   3. What is your fax number? [phone]  4. What type of procedure is the patient having performed? 4 Extractions   5. What date is procedure scheduled or is the patient there now? TBD  (if the patient is at the dentist's office question goes to their cardiologist if he/she is in the office.  If not, question should go to the DOD).   6. What is your question (ex. Antibiotics prior to procedure, holding medication-we need to know how long dentist wants pt to hold med)? Holding blood thinner 3 days prior

## 2019-12-02 ENCOUNTER — Other Ambulatory Visit: Payer: Self-pay | Admitting: Cardiology

## 2019-12-03 ENCOUNTER — Other Ambulatory Visit: Payer: Self-pay | Admitting: Physician Assistant

## 2019-12-24 ENCOUNTER — Other Ambulatory Visit: Payer: Self-pay | Admitting: Cardiology

## 2019-12-26 ENCOUNTER — Encounter: Payer: Self-pay | Admitting: Cardiology

## 2019-12-26 ENCOUNTER — Telehealth: Payer: Self-pay | Admitting: Cardiology

## 2019-12-26 NOTE — Telephone Encounter (Signed)
error 

## 2019-12-26 NOTE — Telephone Encounter (Signed)
Pt c/o medication issue:  1. Name of Medication: XARELTO 20 MG TABS tablet  2. How are you currently taking this medication (dosage and times per day)? Once a day  3. Are you having a reaction (difficulty breathing--STAT)? no  4. What is your medication issue? Patient's wife states the patient had his dental procedure today and would like to know if he continues it today or tomorrow.

## 2019-12-27 NOTE — Telephone Encounter (Signed)
If there is no excessive bleeding he can start Xarelto as soon as possible

## 2019-12-28 NOTE — Telephone Encounter (Signed)
Spoke with patient regarding recommendations.  Patient verbalizes understanding and is agreeable to plan of care. Advised patient to call back with any issues or concerns.  

## 2020-03-13 ENCOUNTER — Other Ambulatory Visit: Payer: Self-pay | Admitting: Cardiology

## 2020-03-24 ENCOUNTER — Other Ambulatory Visit: Payer: Self-pay | Admitting: Cardiology

## 2020-03-27 DIAGNOSIS — I10 Essential (primary) hypertension: Secondary | ICD-10-CM | POA: Diagnosis not present

## 2020-03-27 DIAGNOSIS — H6123 Impacted cerumen, bilateral: Secondary | ICD-10-CM | POA: Diagnosis not present

## 2020-03-27 DIAGNOSIS — F331 Major depressive disorder, recurrent, moderate: Secondary | ICD-10-CM | POA: Diagnosis not present

## 2020-03-27 DIAGNOSIS — Z79899 Other long term (current) drug therapy: Secondary | ICD-10-CM | POA: Diagnosis not present

## 2020-04-09 DIAGNOSIS — Z79899 Other long term (current) drug therapy: Secondary | ICD-10-CM | POA: Diagnosis not present

## 2020-04-22 ENCOUNTER — Ambulatory Visit: Payer: PPO | Admitting: Cardiology

## 2020-04-22 ENCOUNTER — Encounter: Payer: Self-pay | Admitting: Cardiology

## 2020-04-22 ENCOUNTER — Other Ambulatory Visit: Payer: Self-pay

## 2020-04-22 VITALS — BP 114/80 | HR 72 | Ht 72.0 in | Wt 157.2 lb

## 2020-04-22 DIAGNOSIS — R0600 Dyspnea, unspecified: Secondary | ICD-10-CM | POA: Diagnosis not present

## 2020-04-22 DIAGNOSIS — I6990 Unspecified sequelae of unspecified cerebrovascular disease: Secondary | ICD-10-CM | POA: Diagnosis not present

## 2020-04-22 DIAGNOSIS — I482 Chronic atrial fibrillation, unspecified: Secondary | ICD-10-CM

## 2020-04-22 DIAGNOSIS — I2510 Atherosclerotic heart disease of native coronary artery without angina pectoris: Secondary | ICD-10-CM

## 2020-04-22 DIAGNOSIS — I6930 Unspecified sequelae of cerebral infarction: Secondary | ICD-10-CM

## 2020-04-22 DIAGNOSIS — R0609 Other forms of dyspnea: Secondary | ICD-10-CM

## 2020-04-22 NOTE — Patient Instructions (Signed)
Medication Instructions:  Your physician recommends that you continue on your current medications as directed. Please refer to the Current Medication list given to you today.  *If you need a refill on your cardiac medications before your next appointment, please call your pharmacy*   Lab Work: None. If you have labs (blood work) drawn today and your tests are completely normal, you will receive your results only by: . MyChart Message (if you have MyChart) OR . A paper copy in the mail If you have any lab test that is abnormal or we need to change your treatment, we will call you to review the results.   Testing/Procedures: Your physician has requested that you have an echocardiogram. Echocardiography is a painless test that uses sound waves to create images of your heart. It provides your doctor with information about the size and shape of your heart and how well your heart's chambers and valves are working. This procedure takes approximately one hour. There are no restrictions for this procedure.     Follow-Up: At [facility], you and your health needs are our priority.  As part of our continuing mission to provide you with exceptional heart care, we have created designated Provider Care Teams.  These Care Teams include your primary Cardiologist (physician) and Advanced Practice Providers (APPs -  Physician Assistants and Nurse Practitioners) who all work together to provide you with the care you need, when you need it.  We recommend signing up for the patient portal called "MyChart".  Sign up information is provided on this After Visit Summary.  MyChart is used to connect with patients for Virtual Visits (Telemedicine).  Patients are able to view lab/test results, encounter notes, upcoming appointments, etc.  Non-urgent messages can be sent to your provider as well.   To learn more about what you can do with MyChart, go to [email or web address].    Your next appointment:   3  month(s)  The format for your next appointment:   In Person  Provider:   Robert Krasowski, MD   Other Instructions   Echocardiogram An echocardiogram is a procedure that uses painless sound waves (ultrasound) to produce an image of the heart. Images from an echocardiogram can provide important information about:  Signs of coronary artery disease (CAD).  Aneurysm detection. An aneurysm is a weak or damaged part of an artery wall that bulges out from the normal force of blood pumping through the body.  Heart size and shape. Changes in the size or shape of the heart can be associated with certain conditions, including heart failure, aneurysm, and CAD.  Heart muscle function.  Heart valve function.  Signs of a past heart attack.  Fluid buildup around the heart.  Thickening of the heart muscle.  A tumor or infectious growth around the heart valves. Tell a health care provider about:  Any allergies you have.  All medicines you are taking, including vitamins, herbs, eye drops, creams, and over-the-counter medicines.  Any blood disorders you have.  Any surgeries you have had.  Any medical conditions you have.  Whether you are pregnant or may be pregnant. What are the risks? Generally, this is a safe procedure. However, problems may occur, including:  Allergic reaction to dye (contrast) that may be used during the procedure. What happens before the procedure? No specific preparation is needed. You may eat and drink normally. What happens during the procedure?   An IV tube may be inserted into one of your veins.  You may receive   contrast through this tube. A contrast is an injection that improves the quality of the pictures from your heart.  A gel will be applied to your chest.  A wand-like tool (transducer) will be moved over your chest. The gel will help to transmit the sound waves from the transducer.  The sound waves will harmlessly bounce off of your heart to  allow the heart images to be captured in real-time motion. The images will be recorded on a computer. The procedure may vary among health care providers and hospitals. What happens after the procedure?  You may return to your normal, everyday life, including diet, activities, and medicines, unless your health care provider tells you not to do that. Summary  An echocardiogram is a procedure that uses painless sound waves (ultrasound) to produce an image of the heart.  Images from an echocardiogram can provide important information about the size and shape of your heart, heart muscle function, heart valve function, and fluid buildup around your heart.  You do not need to do anything to prepare before this procedure. You may eat and drink normally.  After the echocardiogram is completed, you may return to your normal, everyday life, unless your health care provider tells you not to do that. This information is not intended to replace advice given to you by your health care provider. Make sure you discuss any questions you have with your health care provider. Document Revised: 12/14/2018 Document Reviewed: 09/25/2016 Elsevier Patient Education  2020 [employer]   

## 2020-04-22 NOTE — Progress Notes (Signed)
Cardiology Office Note:    Date:  04/22/2020   ID:  Johnathan Bauer, DOB 05-24-1951, MRN [id number]  PCP:  Enid Skeens., MD  Cardiologist:  Jenne Campus, MD    Referring MD: Enid Skeens., MD   No chief complaint on file. I am having shortness of breath  History of Present Illness:    Johnathan Bauer is a 69 y.o. male hx of hx of CAD, CVA, brain aneurysmstatus post pipeline embolization of the LICA(stenting), chronic atrial fibrillation on anticoagulation.Recently he ended up going to hospital because of severe anemia that required blood transfusion also iron B12 shots.  He comes today to my office for follow-up.  Overall he is doing well.  Comes today to my office for follow-up.  He did have multiple teeth extraction done after that had 5 implants and then have new junctures he is very happy with that.  Described to have some shortness with with exertion he said he did try to do things he would get short of breath this is something new denies have any chest pain tightness squeezing pressure burning chest, no new CVA/TIA.  Past Medical History:  Diagnosis Date  . Anemia   . 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 Arthritis    "touch in my right thumb" (12/31/2014)  . Blood transfusion without reported diagnosis   . Chronic atrial fibrillation (HCC)    did not like coumadin  . Chronic kidney disease 09/2012   patiet denies 08/08/19  . Coronary artery disease    s/p PCI Sept 2013 with DES OM  . Diverticulitis with perforation   . Dysrhythmia    A-fib  . GI bleed   . Pneumonia 09/2012  . Stroke [facility]) 04/2016    Past Surgical History:  Procedure Laterality Date  . BIOPSY  06/09/2019   Procedure: BIOPSY;  Surgeon: [name], MD;  Location: [facility] ENDOSCOPY;  Service: Endoscopy;;  . COLECTOMY WITH COLOSTOMY CREATION/HARTMANN PROCEDURE  2013  . COLONOSCOPY    . COLOSTOMY REVISION  09/02/2012   Procedure: COLON RESECTION SIGMOID;  Surgeon:  Serafina Mitchell, MD;  Location: [facility] OR;  Service: Vascular;  Laterality: N/A;  Sigmoid resection with End Colostomy.  . COLOSTOMY TAKEDOWN N/A 06/06/2013   Procedure: LAPAROSCOPIC OSTOMY TAKEDOWN AND ANASTOMOSIS ;  Surgeon: Ralene Ok, MD;  Location: [place];  Service: General;  Laterality: N/A;  . CORONARY ANGIOPLASTY WITH STENT PLACEMENT  06/2012   "1"  . ESOPHAGOGASTRODUODENOSCOPY (EGD) WITH PROPOFOL N/A 06/09/2019   Procedure: ESOPHAGOGASTRODUODENOSCOPY (EGD) WITH PROPOFOL;  Surgeon: [name], MD;  Location: [place];  Service: Endoscopy;  Laterality: N/A;  . INCISIONAL HERNIA REPAIR  12/31/2014   w/mesh  . INCISIONAL HERNIA REPAIR N/A 12/31/2014   Procedure: OPEN INCISIONAL HERNIA REPAIR WITH MESH (TAR PROCEDURE);  Surgeon: Ralene Ok, MD;  Location: [place];  Service: General;  Laterality: N/A;  . INSERTION OF MESH N/A 12/31/2014   Procedure: INSERTION OF MESH;  Surgeon: Ralene Ok, MD;  Location: [place];  Service: General;  Laterality: N/A;  . IR 3D INDEPENDENT WKST  01/18/2019  . IR ANGIO INTRA EXTRACRAN SEL INTERNAL CAROTID BILAT MOD SED  01/18/2019  . IR ANGIO INTRA EXTRACRAN SEL INTERNAL CAROTID UNI L MOD SED  03/13/2019  . IR ANGIO INTRA EXTRACRAN SEL INTERNAL CAROTID UNI L MOD SED  09/14/2019  . IR ANGIOGRAM EXTREMITY LEFT  01/18/2019  . IR ANGIOGRAM FOLLOW UP STUDY  03/13/2019  .  IR TRANSCATH/EMBOLIZ  03/13/2019  . IR US GUIDE VASC ACCESS RIGHT  09/14/2019  . LAPAROTOMY  09/02/2012   Procedure: EXPLORATORY LAPAROTOMY;  Surgeon: Serafina Mitchell, MD;  Location[facility] OR;  Service: Vascular;  Laterality: N/A;  Exploratory Laparotomy with Sigmoid resection with end colocstomy.  Marland Kitchen RADIOLOGY WITH ANESTHESIA N/A 03/13/2019   Procedure: Pipeline-Medtronic;  Surgeon: Consuella Lose, MD;  Location: [place];  Service: Radiology;  Laterality: N/A;  . TONSILLECTOMY      Current Medications: Current Meds  Medication Sig  . albuterol (VENTOLIN HFA) 108 (90 Base) MCG/ACT inhaler Inhale  2-4 puffs into the lungs every 6 (six) hours as needed for shortness of breath or wheezing.  Marland Kitchen aspirin EC 81 MG tablet Take 81 mg by mouth daily.  . digoxin (LANOXIN) 0.25 MG tablet TAKE 1 TABLET BY MOUTH EVERY DAY  . metoprolol tartrate (LOPRESSOR) 50 MG tablet TAKE 1 TABLET BY MOUTH EVERY DAY  . nitroGLYCERIN (NITROSTAT) 0.4 MG SL tablet Place 1 tablet (0.4 mg total) under the tongue every 5 (five) minutes as needed for chest pain.  Marland Kitchen sertraline (ZOLOFT) 100 MG tablet Take 100 mg by mouth daily.  Alveda Reasons 20 MG TABS tablet TAKE 1 TABLET (20 MG TOTAL) BY MOUTH DAILY WITH EVENING MEAL.     Allergies:   Beef-derived products, Codeine, and Poison ivy extract [poison ivy extract]   Social History   Socioeconomic History  . Marital status: Married    Spouse name: Not on file  . Number of children: 2  . Years of education: Not on file  . Highest education level: Not on file  Occupational History  . Occupation: retired  Tobacco Use  . Smoking status: Current Every Day Smoker    Packs/day: 0.10    Years: 45.00    Pack years: 4.50    Types: Cigarettes    Last attempt to quit: 06/08/2019    Years since quitting: 0.8  . Smokeless tobacco: Never Used  Vaping Use  . Vaping Use: Never used  Substance and Sexual Activity  . Alcohol use: No  . Drug use: No  . Sexual activity: Yes  Other Topics Concern  . 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 Difficulty of Paying Living Expenses:   Food Insecurity:   . Worried About Charity fundraiser in the Last Year:   . Arboriculturist in the Last Year:   Transportation Needs:   . Film/video editor (Medical):   Marland Kitchen Lack of Transportation (Non-Medical):   Physical Activity:   . Days of  Exercise per Week:   . Minutes of Exercise per Session:   Stress:   . Feeling of Stress :   Social Connections:   . Frequency of Communication with Friends and Family:   . Frequency of Social Gatherings with Friends and Family:   . Attends Religious Services:   . Active Member of Clubs or Organizations:   . Attends Archivist Meetings:   Marland Kitchen Marital Status:      Family History: The patient's family history includes Lymphoma in his father; Prostate cancer in his paternal grandfather. There is no history of Colon cancer. ROS:   Please see  the history of present illness.    All 14 point review of systems negative except as described per history of present illness  EKGs/Labs/Other Studies Reviewed:      Recent Labs: 06/09/2019: ALT 14; TSH 3.911 06/10/2019: Magnesium 1.8 09/14/2019: BUN 16; Creatinine, Ser 1.18; Hemoglobin 17.6; Platelets 167; Potassium 4.7; Sodium 139  Recent Lipid Panel    Component Value Date/Time   CHOL 152 06/23/2018 1605   TRIG 83 06/23/2018 1605   HDL 65 06/23/2018 1605   CHOLHDL 2.3 06/23/2018 1605   LDLCALC 70 06/23/2018 1605    Physical Exam:    VS:  BP 114/80   Pulse 72   Ht 6' (1.829 m)   Wt 157 lb 3.2 oz (71.3 kg)   SpO2 95%   BMI 21.32 kg/m     Wt Readings from Last 3 Encounters:  04/22/20 157 lb 3.2 oz (71.3 kg)  10/19/19 173 lb (78.5 kg)  09/14/19 170 lb (77.1 kg)     GEN:  Well nourished, well developed in no acute distress HEENT: Normal NECK: No JVD; No carotid bruits LYMPHATICS: No lymphadenopathy CARDIAC: Irregularly irregular, no murmurs, no rubs, no gallops RESPIRATORY:  Clear to auscultation without rales, wheezing or rhonchi  ABDOMEN: Soft, non-tender, non-distended MUSCULOSKELETAL:  No edema; No deformity  SKIN: Warm and dry LOWER EXTREMITIES: no swelling NEUROLOGIC:  Alert and oriented x 3 PSYCHIATRIC:  Normal affect   ASSESSMENT:    1. Chronic atrial fibrillation ([place]): Anticoagulated his chads 2 Vas score  equals 3   2. Coronary artery disease involving native coronary artery of native heart without angina pectoris   3. 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 Dyspnea on exertion    PLAN:    In order of problems listed above:  1. Chronic atrial fibrillation rate control he is anticoagulated which I will continue. 2. Coronary artery disease status post PTCA and stenting done long time ago in 2013 of obtuse marginal branch.  He is on appropriate medication which I will continue.  For some reason he is not on statin which I cannot understand I want to start him on Lipitor however he want me to call his primary care physician 1st and find out what his cholesterol was.  We will do that.  I do have K PN which I reviewed which showed me his LDL of being 70.  This is however from 2019. 3. Late effect of CVA, no new problems. 4. Dyspnea exertion we will do echocardiogram to assess left ventricle ejection fraction.   Medication Adjustments/Labs and Tests Ordered: Current medicines are reviewed at length with the patient today.  Concerns regarding medicines are outlined above.  No orders of the defined types were placed in this encounter.  Medication changes: No orders of the defined types were placed in this encounter.   Signed, Park Liter, MD, [facility] 04/22/2020 [place]

## 2020-05-15 ENCOUNTER — Other Ambulatory Visit: Payer: Self-pay

## 2020-05-15 ENCOUNTER — Ambulatory Visit (INDEPENDENT_AMBULATORY_CARE_PROVIDER_SITE_OTHER): Payer: PPO

## 2020-05-15 DIAGNOSIS — R0609 Other forms of dyspnea: Secondary | ICD-10-CM

## 2020-05-15 DIAGNOSIS — R0600 Dyspnea, unspecified: Secondary | ICD-10-CM

## 2020-05-15 NOTE — Progress Notes (Signed)
Complete echocardiogram performed.  Jimmy Unnamed Hino RDCS, RVT  

## 2020-05-19 LAB — ECHOCARDIOGRAM COMPLETE
Area-P 1/2: 5.54 cm2
S' Lateral: 2.1 cm

## 2020-07-05 ENCOUNTER — Other Ambulatory Visit: Payer: Self-pay | Admitting: Cardiology

## 2020-07-21 DIAGNOSIS — Z79899 Other long term (current) drug therapy: Secondary | ICD-10-CM | POA: Diagnosis not present

## 2020-07-23 DIAGNOSIS — M1990 Unspecified osteoarthritis, unspecified site: Secondary | ICD-10-CM | POA: Insufficient documentation

## 2020-07-23 DIAGNOSIS — I499 Cardiac arrhythmia, unspecified: Secondary | ICD-10-CM | POA: Insufficient documentation

## 2020-07-23 DIAGNOSIS — D649 Anemia, unspecified: Secondary | ICD-10-CM | POA: Insufficient documentation

## 2020-07-23 DIAGNOSIS — Z5189 Encounter for other specified aftercare: Secondary | ICD-10-CM | POA: Insufficient documentation

## 2020-07-23 DIAGNOSIS — K922 Gastrointestinal hemorrhage, unspecified: Secondary | ICD-10-CM | POA: Insufficient documentation

## 2020-07-23 DIAGNOSIS — I2510 Atherosclerotic heart disease of native coronary artery without angina pectoris: Secondary | ICD-10-CM | POA: Insufficient documentation

## 2020-07-24 ENCOUNTER — Encounter: Payer: Self-pay | Admitting: Cardiology

## 2020-07-24 ENCOUNTER — Ambulatory Visit: Payer: PPO | Admitting: Cardiology

## 2020-07-24 ENCOUNTER — Other Ambulatory Visit: Payer: Self-pay

## 2020-07-24 VITALS — BP 112/80 | HR 89 | Ht 72.0 in | Wt 164.0 lb

## 2020-07-24 DIAGNOSIS — F1010 Alcohol abuse, uncomplicated: Secondary | ICD-10-CM | POA: Diagnosis not present

## 2020-07-24 DIAGNOSIS — I482 Chronic atrial fibrillation, unspecified: Secondary | ICD-10-CM

## 2020-07-24 DIAGNOSIS — I10 Essential (primary) hypertension: Secondary | ICD-10-CM

## 2020-07-24 DIAGNOSIS — I6990 Unspecified sequelae of unspecified cerebrovascular disease: Secondary | ICD-10-CM | POA: Diagnosis not present

## 2020-07-24 NOTE — Addendum Note (Signed)
Addended by: Jerl Santos R on: 07/24/2020 04:15 PM   Modules accepted: Orders

## 2020-07-24 NOTE — Patient Instructions (Signed)
Medication Instructions:  °Your physician recommends that you continue on your current medications as directed. Please refer to the Current Medication list given to you today. ° °*If you need a refill on your cardiac medications before your next appointment, please call your pharmacy* ° ° °Lab Work: °None ordered  ° °If you have labs (blood work) drawn today and your tests are completely normal, you will receive your results only by: °MyChart Message (if you have MyChart) OR °A paper copy in the mail °If you have any lab test that is abnormal or we need to change your treatment, we will call you to review the results. ° ° °Testing/Procedures: °None ordered  ° ° °Follow-Up: °At [facility], you and your health needs are our priority.  As part of our continuing mission to provide you with exceptional heart care, we have created designated Provider Care Teams.  These Care Teams include your primary Cardiologist (physician) and Advanced Practice Providers (APPs -  Physician Assistants and Nurse Practitioners) who all work together to provide you with the care you need, when you need it. ° °We recommend signing up for the patient portal called "MyChart".  Sign up information is provided on this After Visit Summary.  MyChart is used to connect with patients for Virtual Visits (Telemedicine).  Patients are able to view lab/test results, encounter notes, upcoming appointments, etc.  Non-urgent messages can be sent to your provider as well.   °To learn more about what you can do with MyChart, go to [email or web address].   ° °Your next appointment:   °6 month(s) ° °The format for your next appointment:   °In Person ° °Provider:   °Robert Krasowski, MD  ° ° °Other Instructions °None   °

## 2020-07-24 NOTE — Progress Notes (Signed)
Cardiology Office Note:    Date:  07/24/2020   ID:  Johnathan Bauer, DOB [birth year], MRN [id number]  PCP:  Enid Skeens., MD  Cardiologist:  Jenne Campus, MD    Referring MD: Enid Skeens., MD   Chief Complaint  Patient presents with  . Follow-up  I am doing fine.  History of Present Illness:    Johnathan Bauer is a 69 y.o. male with past medical history is significant for coronary artery disease, history of CVA, brain aneurysm, status post embolization of left internal cerebral artery with stenting, chronic atrial fibrillation on anticoagulation.  Also history of anemia that required blood transfusion.  He comes today to my office for follow-up he looks sad and unhappy he is complaining about his wife who gave him a list of things to do.  He does not want to do those.  He was taking Zoloft however stopped it by himself.  Denies have any cardiac complaints, no chest pain tightness squeezing pressure burning chest no palpitations no dizziness.  Past Medical History:  Diagnosis Date  . Absolute anemia   . Aftercare following surgery 07/12/2018  . Alcohol abuse 05/25/2016  . Anemia   . 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 Arteriosclerotic cardiovascular disease (ASCVD) 09/03/2012   DES in 11/2011   . Arthritis    "touch in my right thumb" (12/31/2014)  . Blood transfusion without reported diagnosis   . Blurry vision, bilateral 01/10/2019  . Cerebral aneurysm, nonruptured 03/13/2019  . Chronic anticoagulation 11/30/2016  . Chronic atrial fibrillation (HCC)    did not like coumadin  . Chronic kidney disease 09/2012   patiet denies 08/08/19  . Coronary artery disease    s/p PCI Sept 2013 with DES OM  . Coronary artery disease involving native coronary artery of native heart without angina pectoris 05/20/2015   Overview:  PTCA and stent to obtuse marginal 1 in October 2013  Formatting of this note might be different from the original. PTCA and stent to  obtuse marginal 1 in October 2013  . Diverticulitis of sigmoid colon 09/02/2012  . Diverticulitis with perforation   . Dyspnea on exertion 07/08/2017  . Dysrhythmia    A-fib  . Erosive gastritis with hemorrhage   . Essential hypertension 05/20/2015  . GI bleed   . History of colonic polyps 11/30/2016  . History of stroke 01/10/2019  . Ingrown nail 07/04/2018  . 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 September 2017, left-sided weakness  . Melena   . NSVT, 7 beat run 1/02 09/07/2012  . Perforated sigmoid colon ([place]) 09/03/2012   Secondary to diverticulitis; partial resection and colostomy performed in 08/2012   . Plantar fat pad atrophy of left foot 05/04/2017  . Pneumonia 09/2012  . Prominent metatarsal head of left foot 05/03/2017  . Respiratory failure, post-operative ([place]) 09/07/2012  . S/P hernia repair 12/31/2014  . Stroke ([place]) 04/2016  . Stroke-like symptoms 01/10/2019  . Tailor's bunion of left foot 05/03/2017  . UGIB (upper gastrointestinal bleed) 06/08/2019    Past Surgical History:  Procedure Laterality Date  . BIOPSY  06/09/2019   Procedure: BIOPSY;  Surgeon: [name], MD;  Location: [facility] ENDOSCOPY;  Service: Endoscopy;;  . COLECTOMY WITH COLOSTOMY CREATION/HARTMANN PROCEDURE  2013  . COLONOSCOPY    . COLOSTOMY REVISION  09/02/2012   Procedure: COLON RESECTION SIGMOID;  Surgeon: Serafina Mitchell, MD;  Location: [facility] OR;  Service: Vascular;  Laterality: N/A;  Sigmoid resection with End Colostomy.  . COLOSTOMY TAKEDOWN N/A 06/06/2013   Procedure: LAPAROSCOPIC OSTOMY TAKEDOWN AND ANASTOMOSIS ;  Surgeon: Ralene Ok, MD;  Location: [place];  Service: General;  Laterality: N/A;  . CORONARY ANGIOPLASTY WITH STENT PLACEMENT  06/2012   "1"  . ESOPHAGOGASTRODUODENOSCOPY (EGD) WITH PROPOFOL N/A 06/09/2019   Procedure:  ESOPHAGOGASTRODUODENOSCOPY (EGD) WITH PROPOFOL;  Surgeon: [name], MD;  Location: [place];  Service: Endoscopy;  Laterality: N/A;  . INCISIONAL HERNIA REPAIR  12/31/2014   w/mesh  . INCISIONAL HERNIA REPAIR N/A 12/31/2014   Procedure: OPEN INCISIONAL HERNIA REPAIR WITH MESH (TAR PROCEDURE);  Surgeon: Ralene Ok, MD;  Location: [place];  Service: General;  Laterality: N/A;  . INSERTION OF MESH N/A 12/31/2014   Procedure: INSERTION OF MESH;  Surgeon: Ralene Ok, MD;  Location: [place];  Service: General;  Laterality: N/A;  . IR 3D INDEPENDENT WKST  01/18/2019  . IR ANGIO INTRA EXTRACRAN SEL INTERNAL CAROTID BILAT MOD SED  01/18/2019  . IR ANGIO INTRA EXTRACRAN SEL INTERNAL CAROTID UNI L MOD SED  03/13/2019  . IR ANGIO INTRA EXTRACRAN SEL INTERNAL CAROTID UNI L MOD SED  09/14/2019  . IR ANGIOGRAM EXTREMITY LEFT  01/18/2019  . IR ANGIOGRAM FOLLOW UP STUDY  03/13/2019  . IR TRANSCATH/EMBOLIZ  03/13/2019  . IR US GUIDE VASC ACCESS RIGHT  09/14/2019  . LAPAROTOMY  09/02/2012   Procedure: EXPLORATORY LAPAROTOMY;  Surgeon: Serafina Mitchell, MD;  Location[facility] OR;  Service: Vascular;  Laterality: N/A;  Exploratory Laparotomy with Sigmoid resection with end colocstomy.  Marland Kitchen RADIOLOGY WITH ANESTHESIA N/A 03/13/2019   Procedure: Pipeline-Medtronic;  Surgeon: Consuella Lose, MD;  Location: [place];  Service: Radiology;  Laterality: N/A;  . TONSILLECTOMY      Current Medications: Current Meds  Medication Sig  . albuterol (VENTOLIN HFA) 108 (90 Base) MCG/ACT inhaler Inhale 2-4 puffs into the lungs every 6 (six) hours as needed for shortness of breath or wheezing.  . digoxin (LANOXIN) 0.25 MG tablet TAKE 1 TABLET BY MOUTH EVERY DAY  . metoprolol tartrate (LOPRESSOR) 50 MG tablet TAKE 1 TABLET BY MOUTH EVERY DAY  . nitroGLYCERIN (NITROSTAT) 0.4 MG SL tablet Place 1 tablet (0.4 mg total) under the tongue every 5 (five) minutes as needed for chest pain.  Marland Kitchen XARELTO 20 MG TABS tablet TAKE 1 TABLET (20 MG  TOTAL) BY MOUTH DAILY WITH EVENING MEAL.     Allergies:   Beef-derived products, Codeine, and Poison ivy extract [poison ivy extract]   Social History   Socioeconomic History  . Marital status: Married    Spouse name: Not on file  . Number of children: 2  . Years of education: Not on file  . Highest education level: Not on file  Occupational History  . Occupation: retired  Tobacco Use  . Smoking status: Current Every Day Smoker    Packs/day: 0.10    Years: 45.00    Pack years: 4.50    Types: Cigarettes    Last attempt to quit: 06/08/2019    Years since quitting: 1.1  . Smokeless tobacco: Never Used  Vaping Use  . Vaping Use: Never used  Substance and Sexual Activity  . Alcohol use: No  . Drug use: No  . Sexual activity: Yes  Other Topics Concern  . 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 Difficulty of Paying Living Expenses: Not on file  Food Insecurity:   . Worried About Charity fundraiser in the Last Year: Not on file  . Ran Out of Food in the Last Year: Not on file  Transportation Needs:   . Lack of Transportation (Medical): Not on file  . Lack of Transportation (Non-Medical): Not on file  Physical Activity:   . Days of Exercise per Week: Not on file  . Minutes of Exercise per Session: Not on file  Stress:   . Feeling of Stress : Not on file  Social Connections:   . Frequency of Communication with Friends and Family: Not on file  . Frequency of Social Gatherings with Friends and Family: Not on file  . Attends Religious Services: Not on file  . Active Member of Clubs or Organizations: Not on file  . Attends Archivist Meetings: Not on file  . Marital Status: Not on file     Family History: The  patient's family history includes Lymphoma in his father; Prostate cancer in his paternal grandfather. There is no history of Colon cancer. ROS:   Please see the history of present illness.    All 14 point review of systems negative except as described per history of present illness  EKGs/Labs/Other Studies Reviewed:    Echocardiogram revealed normal left ventricle ejection fraction, biatrial enlargement, no significant valve pathology  Recent Labs: 09/14/2019: BUN 16; Creatinine, Ser 1.18; Hemoglobin 17.6; Platelets 167; Potassium 4.7; Sodium 139  Recent Lipid Panel    Component Value Date/Time   CHOL 152 06/23/2018 1605   TRIG 83 06/23/2018 1605   HDL 65 06/23/2018 1605   CHOLHDL 2.3 06/23/2018 1605   LDLCALC 70 06/23/2018 1605    Physical Exam:    VS:  BP 112/80 (BP Location: Right Arm, Patient Position: Sitting, Cuff Size: Normal)   Pulse 89   Ht 6' (1.829 m)   Wt 164 lb (74.4 kg)   SpO2 96%   BMI 22.24 kg/m     Wt Readings from Last 3 Encounters:  07/24/20 164 lb (74.4 kg)  04/22/20 157 lb 3.2 oz (71.3 kg)  10/19/19 173 lb (78.5 kg)     GEN:  Well nourished, well developed in no acute distress HEENT: Normal NECK: No JVD; No carotid bruits LYMPHATICS: No lymphadenopathy CARDIAC: Irregularly irregular, no murmurs, no rubs, no gallops RESPIRATORY:  Clear to auscultation without rales, wheezing or rhonchi  ABDOMEN: Soft, non-tender, non-distended MUSCULOSKELETAL:  No edema; No deformity  SKIN: Warm and dry LOWER EXTREMITIES: no swelling NEUROLOGIC:  Alert and oriented x 3 PSYCHIATRIC:  Normal affect   ASSESSMENT:    1. Chronic atrial fibrillation ([place]): Anticoagulated his chads 2 Vas score equals 3   2. Essential hypertension   3. 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 Alcohol abuse    PLAN:    In order of problems listed above:  1. Permanent atrial fibrillation: He is anticoagulated which I will  continue. 2. Essential hypertension blood pressure well controlled today. 3. Late effects of CVA.  No new issues. 4. Dyslipidemia I did review his K PN however the last labs I have is from  06/23/2018.  We will call primary care physician for latest report. 5. History of alcohol abuse he looks somewhat depressed today.  He also tell me he discontinue Zoloft because he thinks it did not work.  I asked him to continue discussion with his primary care physician about dose issue.  Likely he does not have homicidal or suicidal idealization.   Medication Adjustments/Labs and Tests Ordered: Current medicines are reviewed at length with the patient today.  Concerns regarding medicines are outlined above.  No orders of the defined types were placed in this encounter.  Medication changes: No orders of the defined types were placed in this encounter.   Signed, Park Liter, MD, [facility] 07/24/2020 3:42 PM    [place]

## 2020-09-11 DIAGNOSIS — J069 Acute upper respiratory infection, unspecified: Secondary | ICD-10-CM | POA: Diagnosis not present

## 2020-09-11 DIAGNOSIS — Z20828 Contact with and (suspected) exposure to other viral communicable diseases: Secondary | ICD-10-CM | POA: Diagnosis not present

## 2020-09-24 ENCOUNTER — Other Ambulatory Visit: Payer: Self-pay | Admitting: Cardiology

## 2020-09-24 NOTE — Telephone Encounter (Signed)
Refill sent to pharmacy.   

## 2020-09-30 DIAGNOSIS — Z9889 Other specified postprocedural states: Secondary | ICD-10-CM | POA: Diagnosis not present

## 2020-09-30 DIAGNOSIS — I671 Cerebral aneurysm, nonruptured: Secondary | ICD-10-CM | POA: Diagnosis not present

## 2020-10-01 DIAGNOSIS — J209 Acute bronchitis, unspecified: Secondary | ICD-10-CM | POA: Diagnosis not present

## 2020-10-01 DIAGNOSIS — Z20828 Contact with and (suspected) exposure to other viral communicable diseases: Secondary | ICD-10-CM | POA: Diagnosis not present

## 2020-10-13 DIAGNOSIS — I671 Cerebral aneurysm, nonruptured: Secondary | ICD-10-CM | POA: Diagnosis not present

## 2020-10-28 DIAGNOSIS — L821 Other seborrheic keratosis: Secondary | ICD-10-CM | POA: Diagnosis not present

## 2020-10-28 DIAGNOSIS — L578 Other skin changes due to chronic exposure to nonionizing radiation: Secondary | ICD-10-CM | POA: Diagnosis not present

## 2020-10-28 DIAGNOSIS — L570 Actinic keratosis: Secondary | ICD-10-CM | POA: Diagnosis not present

## 2021-01-21 ENCOUNTER — Encounter: Payer: Self-pay | Admitting: Cardiology

## 2021-01-21 ENCOUNTER — Ambulatory Visit: Payer: PPO | Admitting: Cardiology

## 2021-01-21 ENCOUNTER — Other Ambulatory Visit: Payer: Self-pay

## 2021-01-21 VITALS — BP 98/68 | HR 62 | Ht 71.0 in | Wt 160.0 lb

## 2021-01-21 DIAGNOSIS — I10 Essential (primary) hypertension: Secondary | ICD-10-CM

## 2021-01-21 DIAGNOSIS — I482 Chronic atrial fibrillation, unspecified: Secondary | ICD-10-CM

## 2021-01-21 DIAGNOSIS — I2510 Atherosclerotic heart disease of native coronary artery without angina pectoris: Secondary | ICD-10-CM | POA: Diagnosis not present

## 2021-01-21 DIAGNOSIS — I6990 Unspecified sequelae of unspecified cerebrovascular disease: Secondary | ICD-10-CM

## 2021-01-21 NOTE — Patient Instructions (Signed)
Medication Instructions:  Your physician recommends that you continue on your current medications as directed. Please refer to the Current Medication list given to you today.  *If you need a refill on your cardiac medications before your next appointment, please call your pharmacy*   Lab Work: Your physician recommends that you return for lab work in: [place]   If you have labs (blood work) drawn today and your tests are completely normal, you will receive your results only by: Marland Kitchen MyChart Message (if you have MyChart) OR . A paper copy in the mail If you have any lab test that is abnormal or we need to change your treatment, we will call you to review the results.   Testing/Procedures: None   Follow-Up: At [facility], you and your health needs are our priority.  As part of our continuing mission to provide you with exceptional heart care, we have created designated Provider Care Teams.  These Care Teams include your primary Cardiologist (physician) and Advanced Practice Providers (APPs -  Physician Assistants and Nurse Practitioners) who all work together to provide you with the care you need, when you need it.  We recommend signing up for the patient portal called "MyChart".  Sign up information is provided on this After Visit Summary.  MyChart is used to connect with patients for Virtual Visits (Telemedicine).  Patients are able to view lab/test results, encounter notes, upcoming appointments, etc.  Non-urgent messages can be sent to your provider as well.   To learn more about what you can do with MyChart, go to [email or web address].    Your next appointment:   6 month(s)  The format for your next appointment:   In Person  Provider:   Jenne Campus, MD   Other Instructions

## 2021-01-21 NOTE — Progress Notes (Signed)
Cardiology Office Note:    Date:  01/21/2021   ID:  Johnathan Bauer, DOB 1951-08-20, MRN [id number]  PCP:  Enid Skeens., MD  Cardiologist:  Jenne Campus, MD    Referring MD: Enid Skeens., MD   Chief Complaint  Patient presents with  . Follow-up  Doing fine  History of Present Illness:    Johnathan Bauer is a 70 y.o. male with past medical history significant for coronary artery disease status post drug-eluting stent and PTCA to obtuse marginal 1 in October 2013.  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  Permanent atrial fibrillation, anticoagulated, dyslipidemia.  Smoking. Comes today to my office for follow-up overall he is doing fine denies have any chest pain tightness squeezing pressure burning chest.  He does have a garden and few tomato plants  Past Medical History:  Diagnosis Date  . Absolute anemia   . Aftercare following surgery 07/12/2018  . Alcohol abuse 05/25/2016  . Anemia   . 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 Arteriosclerotic cardiovascular disease (ASCVD) 09/03/2012   DES in 11/2011   . Arthritis    "touch in my right thumb" (12/31/2014)  . Blood transfusion without reported diagnosis   . Blurry vision, bilateral 01/10/2019  . Cerebral aneurysm, nonruptured 03/13/2019  . Chronic anticoagulation 11/30/2016  . Chronic atrial fibrillation (HCC)    did not like coumadin  . Chronic kidney disease 09/2012   patiet denies 08/08/19  . Coronary artery disease    s/p PCI Sept 2013 with DES OM  . Coronary artery disease involving native coronary artery of native heart without angina pectoris 05/20/2015   Overview:  PTCA and stent to obtuse marginal 1 in October 2013  Formatting of this note might be different from the original. PTCA and stent to obtuse marginal 1 in October 2013  . Diverticulitis of sigmoid colon 09/02/2012  . Diverticulitis with perforation   . Dyspnea on exertion 07/08/2017  . Dysrhythmia     A-fib  . Erosive gastritis with hemorrhage   . Essential hypertension 05/20/2015  . GI bleed   . History of colonic polyps 11/30/2016  . History of stroke 01/10/2019  . Ingrown nail 07/04/2018  . 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 September 2017, left-sided weakness  . Melena   . NSVT, 7 beat run 1/02 09/07/2012  . Perforated sigmoid colon ([name]) 09/03/2012   Secondary to diverticulitis; partial resection and colostomy performed in 08/2012   . Plantar fat pad atrophy of left foot 05/04/2017  . Pneumonia 09/2012  . Prominent metatarsal head of left foot 05/03/2017  . Respiratory failure, post-operative ([place]) 09/07/2012  . S/P hernia repair 12/31/2014  . Stroke ([place]) 04/2016  . Stroke-like symptoms 01/10/2019  . Tailor's bunion of left foot 05/03/2017  . UGIB (upper gastrointestinal bleed) 06/08/2019    Past Surgical History:  Procedure Laterality Date  . BIOPSY  06/09/2019   Procedure: BIOPSY;  Surgeon: [name], MD;  Location: [facility] ENDOSCOPY;  Service: Endoscopy;;  . COLECTOMY WITH COLOSTOMY CREATION/HARTMANN PROCEDURE  2013  . COLONOSCOPY    . COLOSTOMY REVISION  09/02/2012   Procedure: COLON RESECTION SIGMOID;  Surgeon: Serafina Mitchell, MD;  Location: [facility] OR;  Service: Vascular;  Laterality: N/A;  Sigmoid resection with End Colostomy.  . COLOSTOMY TAKEDOWN N/A 06/06/2013  Procedure: LAPAROSCOPIC OSTOMY TAKEDOWN AND ANASTOMOSIS ;  Surgeon: Ralene Ok, MD;  Location: [place];  Service: General;  Laterality: N/A;  . CORONARY ANGIOPLASTY WITH STENT PLACEMENT  06/2012   "1"  . ESOPHAGOGASTRODUODENOSCOPY (EGD) WITH PROPOFOL N/A 06/09/2019   Procedure: ESOPHAGOGASTRODUODENOSCOPY (EGD) WITH PROPOFOL;  Surgeon: [name], MD;  Location: [place];  Service: Endoscopy;  Laterality: N/A;  . INCISIONAL HERNIA REPAIR   12/31/2014   w/mesh  . INCISIONAL HERNIA REPAIR N/A 12/31/2014   Procedure: OPEN INCISIONAL HERNIA REPAIR WITH MESH (TAR PROCEDURE);  Surgeon: Ralene Ok, MD;  Location: [place];  Service: General;  Laterality: N/A;  . INSERTION OF MESH N/A 12/31/2014   Procedure: INSERTION OF MESH;  Surgeon: Ralene Ok, MD;  Location: [place];  Service: General;  Laterality: N/A;  . IR 3D INDEPENDENT WKST  01/18/2019  . IR ANGIO INTRA EXTRACRAN SEL INTERNAL CAROTID BILAT MOD SED  01/18/2019  . IR ANGIO INTRA EXTRACRAN SEL INTERNAL CAROTID UNI L MOD SED  03/13/2019  . IR ANGIO INTRA EXTRACRAN SEL INTERNAL CAROTID UNI L MOD SED  09/14/2019  . IR ANGIOGRAM EXTREMITY LEFT  01/18/2019  . IR ANGIOGRAM FOLLOW UP STUDY  03/13/2019  . IR TRANSCATH/EMBOLIZ  03/13/2019  . IR US GUIDE VASC ACCESS RIGHT  09/14/2019  . LAPAROTOMY  09/02/2012   Procedure: EXPLORATORY LAPAROTOMY;  Surgeon: Serafina Mitchell, MD;  Location: [facility] OR;  Service: Vascular;  Laterality: N/A;  Exploratory Laparotomy with Sigmoid resection with end colocstomy.  Marland Kitchen RADIOLOGY WITH ANESTHESIA N/A 03/13/2019   Procedure: [employer];  Surgeon: Consuella Lose, MD;  Location: [place];  Service: Radiology;  Laterality: N/A;  . TONSILLECTOMY      Current Medications: Current Meds  Medication Sig  . albuterol (VENTOLIN HFA) 108 (90 Base) MCG/ACT inhaler Inhale 2-4 puffs into the lungs every 6 (six) hours as needed for shortness of breath or wheezing.  . digoxin (LANOXIN) 0.25 MG tablet TAKE 1 TABLET BY MOUTH EVERY DAY (Patient taking differently: Take 0.25 mg by mouth daily. TAKE 1 TABLET BY MOUTH EVERY DAY)  . metoprolol tartrate (LOPRESSOR) 50 MG tablet TAKE 1 TABLET BY MOUTH EVERY DAY (Patient taking differently: Take 50 mg by mouth daily.)  . nitroGLYCERIN (NITROSTAT) 0.4 MG SL tablet Place 1 tablet (0.4 mg total) under the tongue every 5 (five) minutes as needed for chest pain.  Marland Kitchen XARELTO 20 MG TABS tablet TAKE 1 TABLET (20 MG TOTAL) BY MOUTH DAILY WITH  EVENING MEAL. (Patient taking differently: Take 20 mg by mouth daily with supper.)     Allergies:   Beef-derived products, Codeine, and Poison ivy extract [poison ivy extract]   Social History   Socioeconomic History  . Marital status: Married    Spouse name: Not on file  . Number of children: 2  . Years of education: Not on file  . Highest education level: Not on file  Occupational History  . Occupation: retired  Tobacco Use  . Smoking status: Current Every Day Smoker    Packs/day: 0.10    Years: 45.00    Pack years: 4.50    Types: Cigarettes    Last attempt to quit: 06/08/2019    Years since quitting: 1.6  . Smokeless tobacco: Never Used  Vaping Use  . Vaping Use: Never used  Substance and Sexual Activity  . Alcohol use: No  . Drug use: No  . Sexual activity: Yes  Other Topics Concern  . Not on file  Social History Narrative  Married, 2 children   Retired from [employer] careers - 1) Taught prison inmates 2) [place] 3) Ecologist Tax Stage manager   Someday smoker, no EtOH, drugs   Social Determinants of Radio broadcast assistant Strain: Not on [employer] Insecurity: Not on file  Transportation Needs: Not on file  Physical Activity: Not on file  Stress: Not on file  Social Connections: Not on file     Family History: The patient's family history includes Lymphoma in his father; Prostate cancer in his paternal grandfather. There is no history of Colon cancer. ROS:   Please see the history of present illness.    All 14 point review of systems negative except as described per history of present illness  EKGs/Labs/Other Studies Reviewed:      Recent Labs: No results found for requested labs within last 8760 hours.  Recent Lipid Panel    Component Value Date/Time   CHOL 152 06/23/2018 1605   TRIG 83 06/23/2018 1605   HDL 65 06/23/2018 1605   CHOLHDL 2.3 06/23/2018 1605   LDLCALC 70 06/23/2018 1605    Physical Exam:     VS:  BP 98/68 (BP Location: Right Arm, Patient Position: Sitting)   Pulse 62   Ht 5\' 11"  (1.803 m)   Wt 160 lb (72.6 kg)   SpO2 95%   BMI 22.32 kg/m     Wt Readings from Last 3 Encounters:  01/21/21 160 lb (72.6 kg)  07/24/20 164 lb (74.4 kg)  04/22/20 157 lb 3.2 oz (71.3 kg)     GEN:  Well nourished, well developed in no acute distress HEENT: Normal NECK: No JVD; No carotid bruits LYMPHATICS: No lymphadenopathy CARDIAC: Irregular irregular, no murmurs, no rubs, no gallops RESPIRATORY:  Clear to auscultation without rales, wheezing or rhonchi  ABDOMEN: Soft, non-tender, non-distended MUSCULOSKELETAL:  No edema; No deformity  SKIN: Warm and dry LOWER EXTREMITIES: no swelling NEUROLOGIC:  Alert and oriented x 3 PSYCHIATRIC:  Normal affect   ASSESSMENT:    1. Coronary artery disease involving native coronary artery of native heart without angina pectoris   2. Chronic atrial fibrillation ([place]): Anticoagulated his chads 2 Vas score equals 3   3. Essential hypertension   4. 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 Coronary disease stable from that point review asymptomatic we will continue present management. 2. Permanent atrial fibrillation with high CHADS2 Vascor he is on Xarelto which I will continue.  No evidence of bleeding, tolerating quite well. 3. Essential hypertension blood pressure well controlled actually on the lower side today we will continue present management. 4. Dyslipidemia I will check his fasting lipid profile today.   Medication Adjustments/Labs and Tests Ordered: Current medicines are reviewed at length with the patient today.  Concerns regarding medicines are outlined above.  No orders of the defined types were placed in this encounter.  Medication changes: No orders of the defined types were placed in this encounter.   Signed, Park Liter, MD, [facility] 01/21/2021 2:22 PM    [facility]

## 2021-01-22 LAB — LIPID PANEL
Chol/HDL Ratio: 3.9 ratio (ref 0.0–5.0)
Cholesterol, Total: 145 mg/dL (ref 100–199)
HDL: 37 mg/dL — ABNORMAL LOW (ref >39)
LDL Chol Calc (NIH): 78 mg/dL (ref 0–99)
Triglycerides: 172 mg/dL — ABNORMAL HIGH (ref 0–149)
VLDL Cholesterol Cal: 30 mg/dL (ref 5–40)

## 2021-02-23 DIAGNOSIS — L570 Actinic keratosis: Secondary | ICD-10-CM | POA: Diagnosis not present

## 2021-02-24 ENCOUNTER — Other Ambulatory Visit: Payer: Self-pay | Admitting: Cardiology

## 2021-02-24 NOTE — Telephone Encounter (Signed)
Refill sent to pharmacy.   

## 2021-05-12 ENCOUNTER — Other Ambulatory Visit: Payer: Self-pay | Admitting: Cardiology

## 2021-05-25 ENCOUNTER — Other Ambulatory Visit: Payer: Self-pay | Admitting: Cardiology

## 2021-06-17 ENCOUNTER — Other Ambulatory Visit: Payer: Self-pay | Admitting: Cardiology

## 2021-06-17 NOTE — Telephone Encounter (Signed)
Prescription refill request for Xarelto received.  Indication:Afib Last office visit:5/22 Weight:72.6 kg Age:70 MYT:RZNBV labs CrCl:needs labs

## 2021-07-20 ENCOUNTER — Other Ambulatory Visit: Payer: Self-pay | Admitting: Cardiology

## 2021-07-20 DIAGNOSIS — I482 Chronic atrial fibrillation, unspecified: Secondary | ICD-10-CM

## 2021-07-20 NOTE — Telephone Encounter (Signed)
Prescription refill request for Xarelto received.  Indication: afib  Last office visit: Agustin Cree, 07/24/2020 Weight: 72.6 kg  Age: 70 yo  Scr: 1.18 09/14/2019 CrCl:   Pt is overdue for blood work. Called PCP office and they are out for lunch.

## 2021-07-20 NOTE — Telephone Encounter (Signed)
Called pt's PCP office, they last blood work they have on the pt is from 07/2020.  Pt is scheduled to see cardiologist 09/10/2021   Called and spoke to pt who stated that he would go on Friday 11/18 to get blood work. Pt stated that he has a couple of weeks left of his Xarelto. Informed pt that once we see the results of blood work we can send in refill.

## 2021-07-21 ENCOUNTER — Encounter: Payer: Self-pay | Admitting: *Deleted

## 2021-07-21 NOTE — Telephone Encounter (Signed)
This encounter was created in error - please disregard.

## 2021-07-24 DIAGNOSIS — I482 Chronic atrial fibrillation, unspecified: Secondary | ICD-10-CM | POA: Diagnosis not present

## 2021-07-24 LAB — CBC
Hematocrit: 51.6 % — ABNORMAL HIGH (ref 37.5–51.0)
Hemoglobin: 17.4 g/dL (ref 13.0–17.7)
MCH: 32.2 pg (ref 26.6–33.0)
MCHC: 33.7 g/dL (ref 31.5–35.7)
MCV: 96 fL (ref 79–97)
Platelets: 187 10*3/uL (ref 150–450)
RBC: 5.4 x10E6/uL (ref 4.14–5.80)
RDW: 12.7 % (ref 11.6–15.4)
WBC: 4.5 10*3/uL (ref 3.4–10.8)

## 2021-07-25 LAB — BASIC METABOLIC PANEL
BUN/Creatinine Ratio: 13 (ref 10–24)
BUN: 14 mg/dL (ref 8–27)
CO2: 23 mmol/L (ref 20–29)
Calcium: 9.3 mg/dL (ref 8.6–10.2)
Chloride: 99 mmol/L (ref 96–106)
Creatinine, Ser: 1.07 mg/dL (ref 0.76–1.27)
Glucose: 103 mg/dL — ABNORMAL HIGH (ref 70–99)
Potassium: 4.5 mmol/L (ref 3.5–5.2)
Sodium: 137 mmol/L (ref 134–144)
eGFR: 75 mL/min/{1.73_m2} (ref >59)

## 2021-07-27 ENCOUNTER — Telehealth: Payer: Self-pay

## 2021-07-27 NOTE — Telephone Encounter (Signed)
Patient notified of results.

## 2021-07-27 NOTE — Telephone Encounter (Signed)
-----   Message from Park Liter, MD sent at 07/27/2021 10:06 AM EST ----- Labs are looking good

## 2021-09-10 ENCOUNTER — Ambulatory Visit: Payer: PPO | Admitting: Cardiology

## 2021-09-10 ENCOUNTER — Encounter: Payer: Self-pay | Admitting: Cardiology

## 2021-09-10 ENCOUNTER — Other Ambulatory Visit: Payer: Self-pay

## 2021-09-10 VITALS — BP 124/78 | HR 66 | Ht 71.0 in | Wt 164.2 lb

## 2021-09-10 DIAGNOSIS — I2510 Atherosclerotic heart disease of native coronary artery without angina pectoris: Secondary | ICD-10-CM | POA: Diagnosis not present

## 2021-09-10 DIAGNOSIS — I10 Essential (primary) hypertension: Secondary | ICD-10-CM | POA: Diagnosis not present

## 2021-09-10 DIAGNOSIS — I482 Chronic atrial fibrillation, unspecified: Secondary | ICD-10-CM | POA: Diagnosis not present

## 2021-09-10 DIAGNOSIS — I671 Cerebral aneurysm, nonruptured: Secondary | ICD-10-CM

## 2021-09-10 DIAGNOSIS — I6990 Unspecified sequelae of unspecified cerebrovascular disease: Secondary | ICD-10-CM

## 2021-09-10 NOTE — Patient Instructions (Signed)
Medication Instructions:  Your physician recommends that you continue on your current medications as directed. Please refer to the Current Medication list given to you today.  *If you need a refill on your cardiac medications before your next appointment, please call your pharmacy*   Lab Work: None If you have labs (blood work) drawn today and your tests are completely normal, you will receive your results only by: [place] (if you have MyChart) OR A paper copy in the mail If you have any lab test that is abnormal or we need to change your treatment, we will call you to review the results.   Testing/Procedures: Your physician has requested that you have an echocardiogram. Echocardiography is a painless test that uses sound waves to create images of your heart. It provides your doctor with information about the size and shape of your heart and how well your hearts chambers and valves are working. This procedure takes approximately one hour. There are no restrictions for this procedure.    Follow-Up: At [facility], you and your health needs are our priority.  As part of our continuing mission to provide you with exceptional heart care, we have created designated Provider Care Teams.  These Care Teams include your primary Cardiologist (physician) and Advanced Practice Providers (APPs -  Physician Assistants and Nurse Practitioners) who all work together to provide you with the care you need, when you need it.  We recommend signing up for the patient portal called "MyChart".  Sign up information is provided on this After Visit Summary.  MyChart is used to connect with patients for Virtual Visits (Telemedicine).  Patients are able to view lab/test results, encounter notes, upcoming appointments, etc.  Non-urgent messages can be sent to your provider as well.   To learn more about what you can do with MyChart, go to [email or web address].    Your next appointment:   6 month(s)  The  format for your next appointment:   In Person  Provider:   Jenne Campus, MDEchocardiogram An echocardiogram is a test that uses sound waves (ultrasound) to produce images of the heart. Images from an echocardiogram can provide important information about: Heart size and shape. The size and thickness and movement of your heart's walls. Heart muscle function and strength. Heart valve function or if you have stenosis. Stenosis is when the heart valves are too narrow. If blood is flowing backward through the heart valves (regurgitation). A tumor or infectious growth around the heart valves. Areas of heart muscle that are not working well because of poor blood flow or injury from a heart attack. Aneurysm detection. An aneurysm is a weak or damaged part of an artery wall. The wall bulges out from the normal force of blood pumping through the body. Tell a health care provider about: Any allergies you have. All medicines you are taking, including vitamins, herbs, eye drops, creams, and over-the-counter medicines. Any blood disorders you have. Any surgeries you have had. Any medical conditions you have. Whether you are pregnant or may be pregnant. What are the risks? Generally, this is a safe test. However, problems may occur, including an allergic reaction to dye (contrast) that may be used during the test. What happens before the test? No specific preparation is needed. You may eat and drink normally. What happens during the test?  You will take off your clothes from the waist up and put on a hospital gown. Electrodes or electrocardiogram (ECG)patches may be placed on your chest. The electrodes or  patches are then connected to a device that monitors your heart rate and rhythm. You will lie down on a table for an ultrasound exam. A gel will be applied to your chest to help sound waves pass through your skin. A handheld device, called a transducer, will be pressed against your chest and  moved over your heart. The transducer produces sound waves that travel to your heart and bounce back (or "echo" back) to the transducer. These sound waves will be captured in real-time and changed into images of your heart that can be viewed on a video monitor. The images will be recorded on a computer and reviewed by your health care provider. You may be asked to change positions or hold your breath for a short time. This makes it easier to get different views or better views of your heart. In some cases, you may receive contrast through an IV in one of your veins. This can improve the quality of the pictures from your heart. The procedure may vary among health care providers and hospitals. What can I expect after the test? You may return to your normal, everyday life, including diet, activities, and medicines, unless your health care provider tells you not to do that. Follow these instructions at home: It is up to you to get the results of your test. Ask your health care provider, or the department that is doing the test, when your results will be ready. Keep all follow-up visits. This is important. Summary An echocardiogram is a test that uses sound waves (ultrasound) to produce images of the heart. Images from an echocardiogram can provide important information about the size and shape of your heart, heart muscle function, heart valve function, and other possible heart problems. You do not need to do anything to prepare before this test. You may eat and drink normally. After the echocardiogram is completed, you may return to your normal, everyday life, unless your health care provider tells you not to do that. This information is not intended to replace advice given to you by your health care provider. Make sure you discuss any questions you have with your health care provider. Document Revised: 05/06/2021 Document Reviewed: 04/15/2020 Elsevier Patient Education  [place].     Other  Instructions

## 2021-09-10 NOTE — Progress Notes (Signed)
Cardiology Office Note:    Date:  09/10/2021   ID:  Johnathan Bauer, DOB 12/21/1950, MRN [id number]  PCP:  Enid Skeens., MD  Cardiologist:  Jenne Campus, MD    Referring MD: Enid Skeens., MD   Chief Complaint  Patient presents with   Follow-up  I am doing fine  History of Present Illness:    Johnathan Bauer is a 71 y.o. 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 He comes today to my office for follow-up overall he seems to be doing well denies have any cardiac complaint he described to father he got lack of stamina but otherwise seems to be doing well no chest pain tightness squeezing pressure burning chest no palpitations no dizziness.  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   stent placed   Arteriosclerotic cardiovascular disease (ASCVD) 09/03/2012   DES in 11/2011    Arthritis    "touch in my right thumb" (12/31/2014)   Blood transfusion without reported diagnosis    Blurry vision, bilateral 01/10/2019   Cerebral aneurysm, nonruptured 03/13/2019   Chronic anticoagulation 11/30/2016   Chronic atrial fibrillation ([place])    did not like coumadin   Chronic kidney disease 09/2012   patiet denies 08/08/19   Coronary artery disease    s/p PCI Sept 2013 with DES OM   Coronary artery disease involving native coronary artery of native heart without angina pectoris 05/20/2015   Overview:  PTCA and stent to obtuse marginal 1 in October 2013  Formatting of this note might be different from the original. 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 September 2017, left-sided weakness   Melena    NSVT, 7 beat run 1/02 09/07/2012   Perforated sigmoid colon ([place]) 09/03/2012   Secondary to diverticulitis; partial resection and colostomy performed in 08/2012    Plantar fat pad atrophy of left foot 05/04/2017   Pneumonia 09/2012   Prominent metatarsal head of left foot 05/03/2017   Respiratory failure, post-operative ([place]) 09/07/2012   S/P hernia repair 12/31/2014   Stroke ([place]) 04/2016   Stroke-like symptoms 01/10/2019   Tailor's bunion of left foot 05/03/2017   UGIB (upper gastrointestinal bleed) 06/08/2019    Past Surgical History:  Procedure Laterality Date   BIOPSY  06/09/2019   Procedure: BIOPSY;  Surgeon: [name], MD;  Location: [facility] ENDOSCOPY;  Service: Endoscopy;;   COLECTOMY WITH COLOSTOMY CREATION/HARTMANN PROCEDURE  2013   COLONOSCOPY     COLOSTOMY REVISION  09/02/2012   Procedure: COLON RESECTION SIGMOID;  Surgeon: Serafina Mitchell, MD;  Location: [place];  Service: Vascular;  Laterality: N/A;  Sigmoid resection with End Colostomy.   COLOSTOMY TAKEDOWN N/A 06/06/2013   Procedure: LAPAROSCOPIC OSTOMY TAKEDOWN AND ANASTOMOSIS ;  Surgeon: Ralene Ok, MD;  Location: [place];  Service: General;  Laterality: N/A;   CORONARY ANGIOPLASTY WITH STENT PLACEMENT  06/2012   "1"   ESOPHAGOGASTRODUODENOSCOPY (EGD) WITH PROPOFOL N/A 06/09/2019   Procedure: ESOPHAGOGASTRODUODENOSCOPY (EGD) WITH PROPOFOL;  Surgeon: [name], MD;  Location: [place];  Service: Endoscopy;   Laterality: N/A;   INCISIONAL HERNIA REPAIR  12/31/2014   w/mesh   INCISIONAL HERNIA REPAIR N/A 12/31/2014   Procedure: OPEN INCISIONAL HERNIA REPAIR WITH MESH (TAR PROCEDURE);  Surgeon: Ralene Ok, MD;  Location: [place];  Service: General;  Laterality: N/A;   INSERTION OF MESH N/A 12/31/2014   Procedure: INSERTION OF MESH;  Surgeon: Ralene Ok, MD;  Location: [place];  Service: General;  Laterality: N/A;   IR 3D INDEPENDENT WKST  01/18/2019   IR ANGIO INTRA EXTRACRAN SEL INTERNAL CAROTID BILAT MOD SED  01/18/2019   IR ANGIO INTRA EXTRACRAN SEL INTERNAL CAROTID UNI L MOD SED  03/13/2019   IR ANGIO INTRA EXTRACRAN SEL INTERNAL CAROTID UNI L MOD SED  09/14/2019   IR ANGIOGRAM EXTREMITY LEFT  01/18/2019   IR ANGIOGRAM FOLLOW UP STUDY  03/13/2019   IR TRANSCATH/EMBOLIZ  03/13/2019   IR US GUIDE VASC ACCESS RIGHT  09/14/2019   LAPAROTOMY  09/02/2012   Procedure: EXPLORATORY LAPAROTOMY;  Surgeon: Serafina Mitchell, MD;  Location[facility] OR;  Service: Vascular;  Laterality: N/A;  Exploratory Laparotomy with Sigmoid resection with end colocstomy.   RADIOLOGY WITH ANESTHESIA N/A 03/13/2019   Procedure: [employer];  Surgeon: Consuella Lose, MD;  Location: [place];  Service: Radiology;  Laterality: N/A;   TONSILLECTOMY      Current Medications: Current Meds  Medication Sig   albuterol (VENTOLIN HFA) 108 (90 Base) MCG/ACT inhaler Inhale 2-4 puffs into the lungs every 6 (six) hours as needed for shortness of breath or wheezing.   digoxin (LANOXIN) 0.25 MG tablet Take 0.25 mg by mouth daily.   metoprolol tartrate (LOPRESSOR) 50 MG tablet Take 50 mg by mouth daily.   nitroGLYCERIN (NITROSTAT) 0.4 MG SL tablet Place 0.4 mg under the tongue every 5 (five) minutes as needed for chest pain.   rivaroxaban (XARELTO) 20 MG TABS tablet Take 20 mg by mouth daily with supper.     Allergies:   Beef-derived products, Codeine, and Poison ivy extract [poison ivy extract]   Social History   Socioeconomic History    Marital status: Married    Spouse name: Not on file   Number of children: 2   Years of education: Not on file   Highest education level: Not on file  Occupational History   Occupation: retired  Tobacco Use   Smoking status: Every Day    Packs/day: 0.10    Years: 45.00    Pack years: 4.50    Types: Cigarettes    Last attempt to quit: 06/08/2019    Years since quitting: 2.2   Smokeless tobacco: Never  Vaping Use   Vaping Use: Never used  Substance and Sexual Activity   Alcohol use: No   Drug use: No   Sexual activity: Yes  Other Topics Concern   Not on file  Social History Narrative   Married, 2 children   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 There is no history of Colon cancer. ROS:   Please see the history of present illness.    All 14 point review of systems negative except as described per history of present illness  EKGs/Labs/Other Studies Reviewed:      Recent Labs: 07/24/2021: BUN 14; Creatinine, Ser 1.07; Hemoglobin 17.4; Platelets 187; Potassium 4.5; Sodium 137  Recent Lipid Panel    Component Value Date/Time   CHOL 145 01/21/2021 1431   TRIG 172 (H) 01/21/2021 1431   HDL 37 (L) 01/21/2021 1431   CHOLHDL 3.9 01/21/2021 1431   LDLCALC 78 01/21/2021 1431    Physical Exam:    VS:  BP 124/78 (BP Location: Left Arm, Patient Position: Sitting)    Pulse 66    Ht 5\' 11"  (1.803 m)    Wt 164 lb 3.2 oz (74.5 kg)    SpO2 95%    BMI 22.90 kg/m     Wt Readings from Last 3 Encounters:   09/10/21 164 lb 3.2 oz (74.5 kg)  01/21/21 160 lb (72.6 kg)  07/24/20 164 lb (74.4 kg)     GEN:  Well nourished, well developed in no acute distress HEENT: Normal NECK: No JVD; No carotid bruits LYMPHATICS: No lymphadenopathy CARDIAC: Irregularly irregular, no murmurs, no rubs, no gallops RESPIRATORY:  Clear to auscultation without rales, wheezing or rhonchi  ABDOMEN: Soft, non-tender, non-distended MUSCULOSKELETAL:  No edema; No deformity  SKIN: Warm and dry LOWER EXTREMITIES: no swelling NEUROLOGIC:  Alert and oriented x 3 PSYCHIATRIC:  Normal affect   ASSESSMENT:    1. Chronic atrial fibrillation ([place]): Anticoagulated his chads 2 Vas score equals 3   2. Coronary artery disease involving native coronary artery of native heart without angina pectoris   3. Essential hypertension   4. Aneurysm of anterior cerebral artery   5. 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  Stable he is anticoagulated he is complaining about prices of Xarelto prior to each increase 400% for him.  He will contact insurance company to find out what the problem is and what could be alternative to Xarelto I told him that Coumadin is an alternative but it is much inferior compared to Xarelto or Eliquis.  He will investigate and let me know. Coronary artery disease stable denies have any chest pain tightness squeezing pressure burning chest Essential hypertension, blood pressure seems to be well controlled continue present management. Dyslipidemia I did review K PN which show me his LDL 78 HDL 37 this is from 01/18/2021.  We will make arrangements for another fasting lipid profile to be done. Fatigue and tiredness I will ask him to have echocardiogram performed to recheck left ventricle ejection fraction   Medication Adjustments/Labs and Tests  Ordered: Current medicines are reviewed at length with the patient today.  Concerns regarding medicines are outlined above.  No orders of the defined types were placed in this encounter.  Medication changes: No orders of the defined types were placed in this encounter.  Signed, Park Liter, MD, [facility] 09/10/2021 9:25 AM    [place]

## 2021-09-10 NOTE — Addendum Note (Signed)
Addended by: Edwyna Shell I on: 09/10/2021 09:44 AM   Modules accepted: Orders

## 2021-09-22 ENCOUNTER — Ambulatory Visit (INDEPENDENT_AMBULATORY_CARE_PROVIDER_SITE_OTHER): Payer: PPO

## 2021-09-22 ENCOUNTER — Other Ambulatory Visit: Payer: Self-pay

## 2021-09-22 DIAGNOSIS — I671 Cerebral aneurysm, nonruptured: Secondary | ICD-10-CM | POA: Diagnosis not present

## 2021-09-22 DIAGNOSIS — I2510 Atherosclerotic heart disease of native coronary artery without angina pectoris: Secondary | ICD-10-CM

## 2021-09-22 DIAGNOSIS — I6990 Unspecified sequelae of unspecified cerebrovascular disease: Secondary | ICD-10-CM

## 2021-09-22 DIAGNOSIS — I10 Essential (primary) hypertension: Secondary | ICD-10-CM

## 2021-09-22 DIAGNOSIS — I482 Chronic atrial fibrillation, unspecified: Secondary | ICD-10-CM | POA: Diagnosis not present

## 2021-09-22 LAB — ECHOCARDIOGRAM COMPLETE
Area-P 1/2: 6.17 cm2
Calc EF: 50.7 %
MV M vel: 5.23 m/s
MV Peak grad: 109.4 mmHg
S' Lateral: 3.6 cm
Single Plane A2C EF: 56.9 %
Single Plane A4C EF: 50.7 %

## 2021-09-23 ENCOUNTER — Other Ambulatory Visit: Payer: Self-pay | Admitting: Cardiology

## 2021-09-23 NOTE — Telephone Encounter (Signed)
Prescription refill request for Xarelto received.  Indication:Afib Last office visit:1/23 Weight:74.5 kg Age:70 Scr:1.0 CrCl:72.43 ml/min  Prescription refilled

## 2021-09-25 ENCOUNTER — Telehealth: Payer: Self-pay

## 2021-09-25 NOTE — Telephone Encounter (Signed)
-----   Message from Park Liter, MD sent at 09/24/2021  1:18 PM EST ----- Echo cardiogram showed preserved left ventricle ejection fraction mild to moderate mitral valve regurgitation left atrium severely enlarged.

## 2021-09-25 NOTE — Telephone Encounter (Signed)
Patient notified of results.

## 2021-10-26 ENCOUNTER — Ambulatory Visit: Payer: PPO | Admitting: Cardiology

## 2021-10-26 ENCOUNTER — Other Ambulatory Visit: Payer: Self-pay

## 2021-10-26 ENCOUNTER — Encounter: Payer: Self-pay | Admitting: Cardiology

## 2021-10-26 VITALS — BP 110/80 | HR 76 | Ht 71.0 in | Wt 165.0 lb

## 2021-10-26 DIAGNOSIS — I10 Essential (primary) hypertension: Secondary | ICD-10-CM

## 2021-10-26 DIAGNOSIS — I482 Chronic atrial fibrillation, unspecified: Secondary | ICD-10-CM | POA: Diagnosis not present

## 2021-10-26 DIAGNOSIS — I6990 Unspecified sequelae of unspecified cerebrovascular disease: Secondary | ICD-10-CM

## 2021-10-26 DIAGNOSIS — I2510 Atherosclerotic heart disease of native coronary artery without angina pectoris: Secondary | ICD-10-CM | POA: Diagnosis not present

## 2021-10-26 NOTE — Addendum Note (Signed)
Addended by: Edwyna Shell I on: 10/26/2021 01:35 PM   Modules accepted: Orders

## 2021-10-26 NOTE — Patient Instructions (Signed)
Medication Instructions:  Your physician recommends that you continue on your current medications as directed. Please refer to the Current Medication list given to you today.  *If you need a refill on your cardiac medications before your next appointment, please call your pharmacy*   Lab Work: Your physician recommends that you return for lab work in:   Labs today: Direct LDL  If you have labs (blood work) drawn today and your tests are completely normal, you will receive your results only by: [place] (if you have MyChart) OR A paper copy in the mail If you have any lab test that is abnormal or we need to change your treatment, we will call you to review the results.   Testing/Procedures: None   Follow-Up: At [facility], you and your health needs are our priority.  As part of our continuing mission to provide you with exceptional heart care, we have created designated Provider Care Teams.  These Care Teams include your primary Cardiologist (physician) and Advanced Practice Providers (APPs -  Physician Assistants and Nurse Practitioners) who all work together to provide you with the care you need, when you need it.  We recommend signing up for the patient portal called "MyChart".  Sign up information is provided on this After Visit Summary.  MyChart is used to connect with patients for Virtual Visits (Telemedicine).  Patients are able to view lab/test results, encounter notes, upcoming appointments, etc.  Non-urgent messages can be sent to your provider as well.   To learn more about what you can do with MyChart, go to [email or web address].    Your next appointment:   6 month(s)  The format for your next appointment:   In Person  Provider:   Jenne Campus, MD    Other Instructions None

## 2021-10-26 NOTE — Progress Notes (Signed)
Cardiology Office Note:    Date:  10/26/2021   ID:  Johnathan, Bauer [birth year], MRN [id number]  PCP:  Enid Skeens., MD  Cardiologist:  Jenne Campus, MD    Referring MD: Enid Skeens., MD   Chief Complaint  Patient presents with   Follow-up  Doing well  History of Present Illness:    Johnathan Bauer is a 71 y.o. 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 Comes today 2 months of follow-up.  Overall he does not have any complaints however he is wife telling me that he does not do anything he just sits on the couch all day watch TV.  She is very upset about it because he used to be very active lose to be able to do a lot of things does not do anything.  He denies have any chest pain tightness squeezing pressure burning chest no palpitations dizziness swelling of lower extremities.  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   stent placed   Arteriosclerotic cardiovascular disease (ASCVD) 09/03/2012   DES in 11/2011    Arthritis    "touch in my right thumb" (12/31/2014)   Blood transfusion without reported diagnosis    Blurry vision, bilateral 01/10/2019   Cerebral aneurysm, nonruptured 03/13/2019   Chronic anticoagulation 11/30/2016   Chronic atrial fibrillation ([place])    did not like coumadin   Chronic kidney disease 09/2012   patiet denies 08/08/19   Coronary artery disease    s/p PCI Sept 2013 with DES OM   Coronary artery disease involving native coronary artery of native heart without angina pectoris 05/20/2015   Overview:  PTCA and stent to obtuse marginal 1 in October 2013  Formatting of this note might be different  from the original. 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 September 2017, left-sided weakness   Melena    NSVT, 7 beat run 1/02 09/07/2012   Perforated sigmoid colon ([place]) 09/03/2012   Secondary to diverticulitis; partial resection and colostomy performed in 08/2012    Plantar fat pad atrophy of left foot 05/04/2017   Pneumonia 09/2012   Prominent metatarsal head of left foot 05/03/2017   Respiratory failure, post-operative ([place]) 09/07/2012   S/P hernia repair 12/31/2014   Stroke ([place]) 04/2016   Stroke-like symptoms 01/10/2019   Tailor's bunion of left foot 05/03/2017   UGIB (upper gastrointestinal bleed) 06/08/2019    Past Surgical History:  Procedure Laterality Date   BIOPSY  06/09/2019   Procedure: BIOPSY;  Surgeon: [name], MD;  Location: [facility] ENDOSCOPY;  Service: Endoscopy;;   COLECTOMY WITH COLOSTOMY CREATION/HARTMANN  PROCEDURE  2013   COLONOSCOPY     COLOSTOMY REVISION  09/02/2012   Procedure: COLON RESECTION SIGMOID;  Surgeon: Serafina Mitchell, MD;  Location[facility] OR;  Service: Vascular;  Laterality: N/A;  Sigmoid resection with End Colostomy.   COLOSTOMY TAKEDOWN N/A 06/06/2013   Procedure: LAPAROSCOPIC OSTOMY TAKEDOWN AND ANASTOMOSIS ;  Surgeon: Ralene Ok, MD;  Location: [place];  Service: General;  Laterality: N/A;   CORONARY ANGIOPLASTY WITH STENT PLACEMENT  06/2012   "1"   ESOPHAGOGASTRODUODENOSCOPY (EGD) WITH PROPOFOL N/A 06/09/2019    Procedure: ESOPHAGOGASTRODUODENOSCOPY (EGD) WITH PROPOFOL;  Surgeon: [name], MD;  Location: [place];  Service: Endoscopy;  Laterality: N/A;   INCISIONAL HERNIA REPAIR  12/31/2014   w/mesh   INCISIONAL HERNIA REPAIR N/A 12/31/2014   Procedure: OPEN INCISIONAL HERNIA REPAIR WITH MESH (TAR PROCEDURE);  Surgeon: Ralene Ok, MD;  Location: [place];  Service: General;  Laterality: N/A;   INSERTION OF MESH N/A 12/31/2014   Procedure: INSERTION OF MESH;  Surgeon: Ralene Ok, MD;  Location: [place];  Service: General;  Laterality: N/A;   IR 3D INDEPENDENT WKST  01/18/2019   IR ANGIO INTRA EXTRACRAN SEL INTERNAL CAROTID BILAT MOD SED  01/18/2019   IR ANGIO INTRA EXTRACRAN SEL INTERNAL CAROTID UNI L MOD SED  03/13/2019   IR ANGIO INTRA EXTRACRAN SEL INTERNAL CAROTID UNI L MOD SED  09/14/2019   IR ANGIOGRAM EXTREMITY LEFT  01/18/2019   IR ANGIOGRAM FOLLOW UP STUDY  03/13/2019   IR TRANSCATH/EMBOLIZ  03/13/2019   IR US GUIDE VASC ACCESS RIGHT  09/14/2019   LAPAROTOMY  09/02/2012   Procedure: EXPLORATORY LAPAROTOMY;  Surgeon: Serafina Mitchell, MD;  [facility];  Service: Vascular;  Laterality: N/A;  Exploratory Laparotomy with Sigmoid resection with end colocstomy.   RADIOLOGY WITH ANESTHESIA N/A 03/13/2019   Procedure: [employer];  Surgeon: Consuella Lose, MD;  Location: [place];  Service: Radiology;  Laterality: N/A;   TONSILLECTOMY      Current Medications: Current Meds  Medication Sig   albuterol (VENTOLIN HFA) 108 (90 Base) MCG/ACT inhaler Inhale 2-4 puffs into the lungs every 6 (six) hours as needed for shortness of breath or wheezing.   digoxin (LANOXIN) 0.25 MG tablet Take 0.25 mg by mouth daily.   metoprolol tartrate (LOPRESSOR) 50 MG tablet Take 50 mg by mouth daily.   nitroGLYCERIN (NITROSTAT) 0.4 MG SL tablet Place 0.4 mg under the tongue every 5 (five) minutes as needed for chest pain.   rivaroxaban (XARELTO) 20 MG TABS tablet TAKE 1 TABLET BY MOUTH DAILY WITH EVENING MEAL      Allergies:   Beef-derived products, Codeine, and Poison ivy extract [poison ivy extract]   Social History   Socioeconomic History   Marital status: Married    Spouse name: Not on file   Number of children: 2   Years of education: Not on file   Highest education level: Not on file  Occupational History   Occupation: retired  Tobacco Use   Smoking status: Every Day    Packs/day: 0.10    Years: 45.00    Pack years: 4.50    Types: Cigarettes    Last attempt to quit: 06/08/2019    Years since quitting: 2.3   Smokeless tobacco: Never  Vaping Use   Vaping Use: Never used  Substance and Sexual Activity   Alcohol use: No   Drug use: No   Sexual activity: Yes  Other Topics Concern   Not on file  Social History Narrative   Married, 2 children   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 There is no history of Colon cancer. ROS:   Please see the history of present illness.    All 14 point review of systems negative except as described per history of present illness  EKGs/Labs/Other Studies Reviewed:      Recent Labs: 07/24/2021: BUN 14; Creatinine, Ser 1.07; Hemoglobin 17.4; Platelets 187; Potassium 4.5; Sodium 137  Recent Lipid Panel    Component Value Date/Time   CHOL 145 01/21/2021 1431   TRIG 172 (H) 01/21/2021 1431   HDL 37 (L) 01/21/2021 1431   CHOLHDL 3.9 01/21/2021 1431   LDLCALC 78 01/21/2021 1431    Physical Exam:    VS:  BP 110/80 (BP Location: Right Arm, Patient Position: Sitting,  Cuff Size: Normal)    Pulse 76    Ht 5\' 11"  (1.803 m)    Wt 165 lb (74.8 kg)    SpO2 92%    BMI 23.01 kg/m     Wt Readings from Last 3 Encounters:  10/26/21 165 lb (74.8 kg)  09/10/21 164 lb 3.2 oz (74.5 kg)  01/21/21 160 lb (72.6 kg)     GEN:  Well nourished, well developed in no acute distress HEENT: Normal NECK: No JVD; No carotid bruits LYMPHATICS: No lymphadenopathy CARDIAC: Irregular irregular, no murmurs, no rubs, no gallops RESPIRATORY:  Clear to auscultation without rales, wheezing or rhonchi  ABDOMEN: Soft, non-tender, non-distended MUSCULOSKELETAL:  No edema; No deformity  SKIN: Warm and dry LOWER EXTREMITIES: no swelling NEUROLOGIC:  Alert and oriented x 3 PSYCHIATRIC:  Normal affect   ASSESSMENT:    1. Coronary artery disease involving native coronary artery of native heart without angina pectoris   2. Essential hypertension   3. Chronic atrial fibrillation ([place]): Anticoagulated his chads 2 Vas score equals 3   4. 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 Essential hypertension blood pressure well controlled continue present management. Atrial fibrillation which is permanent.  Continue present management including anticoagulation. Dyslipidemia we will check his fasting lipid profile today.  I did review K PN which show me data from summer of last year when his LDL was 78 HDL 37.  We will recheck it We did talk about healthy lifestyle need to exercise on the regular basis which he understand he will try to do it his wife was actively participating in the discussion and she will encourage him to be more active   Medication Adjustments/Labs and Tests Ordered: Current medicines are reviewed at length with the patient today.  Concerns regarding medicines are outlined above.  No  orders of the defined types were placed in this encounter.  Medication changes: No orders of the defined types were placed in this encounter.   Signed, Park Liter, MD,  [facility] 10/26/2021 1:28 PM    [facility]

## 2021-10-27 LAB — LDL CHOLESTEROL, DIRECT: LDL Direct: 73 mg/dL (ref 0–99)

## 2021-12-11 ENCOUNTER — Other Ambulatory Visit: Payer: Self-pay | Admitting: Cardiology

## 2022-01-23 ENCOUNTER — Other Ambulatory Visit: Payer: Self-pay | Admitting: Cardiology

## 2022-01-25 NOTE — Telephone Encounter (Signed)
Prescription refill request for Xarelto received.  Indication:Afib Last office visit:2/23 Weight:74.8 kg Age:71 Scr:1.0 CrCl:71.68 ml/min  Prescription refilled

## 2022-02-17 ENCOUNTER — Ambulatory Visit: Payer: PPO | Admitting: Pulmonary Disease

## 2022-02-17 ENCOUNTER — Encounter: Payer: Self-pay | Admitting: Pulmonary Disease

## 2022-02-17 VITALS — BP 110/70 | HR 59 | Temp 98.0°F | Ht 71.0 in | Wt 149.2 lb

## 2022-02-17 DIAGNOSIS — F17200 Nicotine dependence, unspecified, uncomplicated: Secondary | ICD-10-CM

## 2022-02-17 DIAGNOSIS — R0609 Other forms of dyspnea: Secondary | ICD-10-CM

## 2022-02-17 MED ORDER — ANORO ELLIPTA 62.5-25 MCG/ACT IN AEPB: 1.0000 | INHALATION_SPRAY | Freq: Every day | RESPIRATORY_TRACT | 3 refills | Status: DC

## 2022-02-17 NOTE — Progress Notes (Signed)
Johnathan Bauer    [id number]    28-Oct-1950  Primary Care Physician:Slatosky, Marshall Cork., MD  Referring Physician: Enid Skeens., MD [address],   [id number]  Chief complaint:   Patient with shortness of breath on exertion Recent exacerbation  HPI:  Patient was recently treated for COPD exacerbation Has had 2 bouts of bronchitis this year  Recently completed a course of antibiotics and steroids  Is feeling at baseline  Usually able to tolerate some activity Able to walk half a mile, does not do this on a regular basis He is quite sedentary according to spouse  An active smoker, 4-5 sticks a day, smoked heavier in the past Gets short of breath with moderate exertion Shortness of breath, wheezing, cough with sputum production  History of atrial fibrillation, coronary artery disease, hypertension, cerebral aneurysm, stroke with some left-sided weakness   Outpatient Encounter Medications as of 02/17/2022  Medication Sig   albuterol (VENTOLIN HFA) 108 (90 Base) MCG/ACT inhaler Inhale 2-4 puffs into the lungs every 6 (six) hours as needed for shortness of breath or wheezing.   digoxin (LANOXIN) 0.25 MG tablet Take 0.25 mg by mouth daily.   metoprolol tartrate (LOPRESSOR) 50 MG tablet TAKE 1 TABLET BY MOUTH EVERY DAY   nitroGLYCERIN (NITROSTAT) 0.4 MG SL tablet Place 0.4 mg under the tongue every 5 (five) minutes as needed for chest pain.   XARELTO 20 MG TABS tablet TAKE 1 TABLET BY MOUTH DAILY WITH EVENING MEAL   [DISCONTINUED] amoxicillin-clavulanate (AUGMENTIN) 875-125 MG tablet SMARTSIG:1 Tablet(s) By Mouth Every 12 Hours (Patient not taking: Reported on 02/17/2022)   [DISCONTINUED] predniSONE (DELTASONE) 20 MG tablet Take by mouth.   No facility-administered encounter medications on file as of 02/17/2022.    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   stent placed   Arteriosclerotic cardiovascular disease (ASCVD) 09/03/2012   DES in 11/2011    Arthritis    "touch in my right thumb" (12/31/2014)   Blood transfusion without reported diagnosis    Blurry vision, bilateral 01/10/2019   Cerebral aneurysm, nonruptured 03/13/2019   Chronic anticoagulation 11/30/2016   Chronic atrial fibrillation ([place])    did not like coumadin   Chronic kidney disease 09/2012   patiet denies 08/08/19   Coronary artery disease    s/p PCI Sept 2013 with DES OM   Coronary artery disease involving native coronary artery of native heart without angina pectoris 05/20/2015   Overview:  PTCA and stent to obtuse marginal 1 in October 2013  Formatting of this note might be different from the original. 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 September 2017, left-sided weakness   Melena    NSVT, 7 beat run 1/02 09/07/2012   Perforated sigmoid colon ([place]) 09/03/2012   Secondary to diverticulitis; partial resection and colostomy performed in 08/2012    Plantar fat pad atrophy of left foot 05/04/2017   Pneumonia 09/2012   Prominent metatarsal head of left foot 05/03/2017    Respiratory failure, post-operative ([place]) 09/07/2012   S/P hernia repair 12/31/2014   Stroke ([place]) 04/2016   Stroke-like symptoms 01/10/2019   Tailor's bunion of left foot 05/03/2017   UGIB (upper gastrointestinal bleed) 06/08/2019    Past Surgical History:  Procedure Laterality Date   BIOPSY  06/09/2019   Procedure: BIOPSY;  Surgeon: [name], MD;  Location: [facility] ENDOSCOPY;  Service: Endoscopy;;   COLECTOMY WITH COLOSTOMY CREATION/HARTMANN PROCEDURE  2013   COLONOSCOPY     COLOSTOMY REVISION  09/02/2012   Procedure: COLON RESECTION SIGMOID;  Surgeon: Serafina Mitchell, MD;  Location: [facility] OR;  Service: Vascular;  Laterality: N/A;  Sigmoid resection with End Colostomy.   COLOSTOMY TAKEDOWN N/A 06/06/2013   Procedure: LAPAROSCOPIC OSTOMY TAKEDOWN AND ANASTOMOSIS ;  Surgeon: Ralene Ok, MD;  Location: [place];  Service: General;  Laterality: N/A;   CORONARY ANGIOPLASTY WITH STENT PLACEMENT  06/2012   "1"   ESOPHAGOGASTRODUODENOSCOPY (EGD) WITH PROPOFOL N/A 06/09/2019   Procedure: ESOPHAGOGASTRODUODENOSCOPY (EGD) WITH PROPOFOL;  Surgeon: [name], MD;  Location: [place];  Service: Endoscopy;  Laterality: N/A;   INCISIONAL HERNIA REPAIR  12/31/2014   w/mesh   INCISIONAL HERNIA REPAIR N/A 12/31/2014   Procedure: OPEN INCISIONAL HERNIA REPAIR WITH MESH (TAR PROCEDURE);  Surgeon: Ralene Ok, MD;  Location: [place];  Service: General;  Laterality: N/A;   INSERTION OF MESH N/A 12/31/2014   Procedure: INSERTION OF MESH;  Surgeon: Ralene Ok, MD;  Location: [place];  Service: General;  Laterality: N/A;   IR 3D INDEPENDENT WKST  01/18/2019   IR ANGIO INTRA EXTRACRAN SEL INTERNAL CAROTID BILAT MOD SED  01/18/2019   IR ANGIO INTRA EXTRACRAN SEL INTERNAL CAROTID UNI L MOD SED  03/13/2019   IR ANGIO INTRA EXTRACRAN SEL INTERNAL CAROTID UNI L MOD SED  09/14/2019   IR ANGIOGRAM EXTREMITY LEFT  01/18/2019   IR ANGIOGRAM FOLLOW UP STUDY  03/13/2019   IR TRANSCATH/EMBOLIZ  03/13/2019   IR US GUIDE VASC  ACCESS RIGHT  09/14/2019   LAPAROTOMY  09/02/2012   Procedure: EXPLORATORY LAPAROTOMY;  Surgeon: Serafina Mitchell, MD;  [facility];  Service: Vascular;  Laterality: N/A;  Exploratory Laparotomy with Sigmoid resection with end colocstomy.   RADIOLOGY WITH ANESTHESIA N/A 03/13/2019   Procedure: [employer];  Surgeon: Consuella Lose, MD;  Location: [place];  Service: Radiology;  Laterality: N/A;   TONSILLECTOMY      Family History  Problem Relation Age of Onset   Lymphoma Father    Prostate cancer Paternal Grandfather    Colon cancer Neg Hx     Social History   Socioeconomic History   Marital status: Married    Spouse name: Not on file   Number of children: 2   Years of education: Not on file   Highest education level: Not on file  Occupational History   Occupation: retired  Tobacco Use   Smoking status: Every Day    Packs/day: 0.10    Years: 45.00    Total pack  years: 4.50    Types: Cigarettes    Last attempt to quit: 06/08/2019    Years since quitting: 2.6   Smokeless tobacco: Never   Tobacco comments:    4-5  cigs per day.   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  Respiratory:  Positive for cough, shortness of breath and wheezing.     Vitals:   02/17/22 1102  BP: 110/70  Pulse: (!) 59  Temp: 98  F (36.7 C)  SpO2: 97%     Physical Exam Constitutional:      Appearance: Normal appearance.  HENT:     Head: Normocephalic.     Mouth/Throat:     Mouth: Mucous membranes are moist.  Cardiovascular:     Rate and Rhythm: Normal rate and regular rhythm.     Heart sounds: No murmur heard.    No friction rub.  Pulmonary:     Effort: No respiratory distress.     Breath sounds: No stridor. No wheezing or rhonchi.  Musculoskeletal:     Cervical back: No rigidity or tenderness.  Neurological:     Mental Status: He is alert.  Psychiatric:        Mood and Affect: Mood normal.     Data Reviewed: Echocardiogram 09/22/2021 with normal ejection fraction 60-65, mitral regurg, dilated left atrium  No previous CT scan of the chest  Assessment:  Chronic obstructive pulmonary disease  Active smoker  Shortness of breath on exertion    Plan/Recommendations: Obtain CT scan of the chest-low-dose CT  Schedule for pulmonary function test  Prescription for Anoro sent to pharmacy  Continue albuterol as needed  Follow-up in 6 to 8 weeks  Counseled extensively about the need to quit smoking and how continuing to smoke will continue to place him at significant risk of exacerbation and decompensation  Sherrilyn Rist MD [facility] 02/17/2022, 11:30 AM  CC: Enid Skeens., MD

## 2022-02-17 NOTE — Patient Instructions (Signed)
Prescription for Anoro sent to pharmacy for you  Continue to work on quitting smoking  Graded exercises as tolerated  CT scan of the chest without contrast-low-dose CT  PFT on day of next visit  Follow-up in 6 to 8 weeks

## 2022-04-26 ENCOUNTER — Encounter: Payer: Self-pay | Admitting: Cardiology

## 2022-04-26 ENCOUNTER — Ambulatory Visit: Payer: PPO | Admitting: Cardiology

## 2022-04-26 VITALS — BP 90/58 | HR 63 | Ht 71.0 in | Wt 149.5 lb

## 2022-04-26 DIAGNOSIS — R0609 Other forms of dyspnea: Secondary | ICD-10-CM

## 2022-04-26 DIAGNOSIS — I2510 Atherosclerotic heart disease of native coronary artery without angina pectoris: Secondary | ICD-10-CM

## 2022-04-26 DIAGNOSIS — I10 Essential (primary) hypertension: Secondary | ICD-10-CM | POA: Diagnosis not present

## 2022-04-26 DIAGNOSIS — I482 Chronic atrial fibrillation, unspecified: Secondary | ICD-10-CM

## 2022-04-26 DIAGNOSIS — R5383 Other fatigue: Secondary | ICD-10-CM

## 2022-04-26 NOTE — Progress Notes (Unsigned)
Cardiology Office Note:    Date:  04/26/2022   ID:  Johnathan Bauer, DOB 12/26/1950, MRN [id number]  PCP:  Johnathan Bauer., MD  Cardiologist:  Johnathan Campus, MD    Referring MD: Johnathan Bauer., MD   Chief Complaint  Patient presents with   Follow-up  Doing well  History of Present Illness:    Johnathan Bauer is a 71 y.o. male with past medical history significant for coronary artery disease status post drug-eluting stent and PTCA to obtuse marginal 1 which was done in October 2013, permanent atrial fibrillation, aneurysm of the anterior cerebral artery that required stenting, dyslipidemia. He comes today to my office for follow-up.  Overall he is doing well.  He denies have any chest pain tightness squeezing pressure burning chest no palpitations dizziness, he does have some bilateral swelling of ankles.  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   stent placed   Arteriosclerotic cardiovascular disease (ASCVD) 09/03/2012   DES in 11/2011    Arthritis    "touch in my right thumb" (12/31/2014)   Blood transfusion without reported diagnosis    Blurry vision, bilateral 01/10/2019   Cerebral aneurysm, nonruptured 03/13/2019   Chronic anticoagulation 11/30/2016   Chronic atrial fibrillation ([place])    did not like coumadin   Chronic kidney disease 09/2012   patiet denies 08/08/19   Coronary artery disease    s/p PCI Sept 2013 with DES OM   Coronary artery disease involving native coronary artery of native heart without angina pectoris 05/20/2015   Overview:  PTCA and stent to obtuse marginal 1 in October 2013  Formatting of this note might be different from the original. 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 September 2017, left-sided weakness   Melena    NSVT, 7 beat run 1/02 09/07/2012   Perforated sigmoid colon ([place]) 09/03/2012   Secondary to diverticulitis; partial resection and colostomy performed in 08/2012    Plantar fat pad atrophy of left foot 05/04/2017   Pneumonia 09/2012   Prominent metatarsal head of left foot 05/03/2017   Respiratory failure, post-operative ([place]) 09/07/2012   S/P hernia repair 12/31/2014   Stroke ([place]) 04/2016   Stroke-like symptoms 01/10/2019   Tailor's bunion of left foot 05/03/2017   UGIB (upper gastrointestinal bleed) 06/08/2019    Past Surgical History:  Procedure Laterality Date   BIOPSY  06/09/2019   Procedure: BIOPSY;  Surgeon: [name], MD;  Location: [facility] ENDOSCOPY;  Service: Endoscopy;;   COLECTOMY WITH COLOSTOMY CREATION/HARTMANN PROCEDURE  2013   COLONOSCOPY     COLOSTOMY REVISION  09/02/2012   Procedure: COLON RESECTION SIGMOID;  Surgeon: Serafina Mitchell, MD;  [facility] OR;  Service: Vascular;  Laterality: N/A;  Sigmoid resection with End Colostomy.  COLOSTOMY TAKEDOWN N/A 06/06/2013   Procedure: LAPAROSCOPIC OSTOMY TAKEDOWN AND ANASTOMOSIS ;  Surgeon: Ralene Ok, MD;  Location: [place];  Service: General;  Laterality: N/A;   CORONARY ANGIOPLASTY WITH STENT PLACEMENT  06/2012   "1"   ESOPHAGOGASTRODUODENOSCOPY (EGD) WITH PROPOFOL N/A 06/09/2019   Procedure: ESOPHAGOGASTRODUODENOSCOPY (EGD) WITH PROPOFOL;  Surgeon: [name], MD;  Location: [place];  Service: Endoscopy;  Laterality: N/A;   INCISIONAL HERNIA REPAIR  12/31/2014   w/mesh    INCISIONAL HERNIA REPAIR N/A 12/31/2014   Procedure: OPEN INCISIONAL HERNIA REPAIR WITH MESH (TAR PROCEDURE);  Surgeon: Ralene Ok, MD;  Location: [place];  Service: General;  Laterality: N/A;   INSERTION OF MESH N/A 12/31/2014   Procedure: INSERTION OF MESH;  Surgeon: Ralene Ok, MD;  Location: [place];  Service: General;  Laterality: N/A;   IR 3D INDEPENDENT WKST  01/18/2019   IR ANGIO INTRA EXTRACRAN SEL INTERNAL CAROTID BILAT MOD SED  01/18/2019   IR ANGIO INTRA EXTRACRAN SEL INTERNAL CAROTID UNI L MOD SED  03/13/2019   IR ANGIO INTRA EXTRACRAN SEL INTERNAL CAROTID UNI L MOD SED  09/14/2019   IR ANGIOGRAM EXTREMITY LEFT  01/18/2019   IR ANGIOGRAM FOLLOW UP STUDY  03/13/2019   IR TRANSCATH/EMBOLIZ  03/13/2019   IR US GUIDE VASC ACCESS RIGHT  09/14/2019   LAPAROTOMY  09/02/2012   Procedure: EXPLORATORY LAPAROTOMY;  Surgeon: Serafina Mitchell, MD;  [facility];  Service: Vascular;  Laterality: N/A;  Exploratory Laparotomy with Sigmoid resection with end colocstomy.   RADIOLOGY WITH ANESTHESIA N/A 03/13/2019   Procedure: [employer];  Surgeon: Consuella Lose, MD;  Location: [place];  Service: Radiology;  Laterality: N/A;   TONSILLECTOMY      Current Medications: Current Meds  Medication Sig   albuterol (VENTOLIN HFA) 108 (90 Base) MCG/ACT inhaler Inhale 2-4 puffs into the lungs every 6 (six) hours as needed for shortness of breath or wheezing.   digoxin (LANOXIN) 0.25 MG tablet Take 0.25 mg by mouth daily.   metoprolol tartrate (LOPRESSOR) 50 MG tablet TAKE 1 TABLET BY MOUTH EVERY DAY (Patient taking differently: Take 50 mg by mouth 2 (two) times daily.)   nitroGLYCERIN (NITROSTAT) 0.4 MG SL tablet Place 0.4 mg under the tongue every 5 (five) minutes as needed for chest pain.   XARELTO 20 MG TABS tablet TAKE 1 TABLET BY MOUTH DAILY WITH EVENING MEAL (Patient taking differently: Take 20 mg by mouth daily with supper.)   [DISCONTINUED] umeclidinium-vilanterol (ANORO ELLIPTA) 62.5-25 MCG/ACT  AEPB Inhale 1 puff into the lungs daily.     Allergies:   Beef-derived products, Poison ivy extract [poison ivy extract], and Codeine   Social History   Socioeconomic History   Marital status: Married    Spouse name: Not on file   Number of children: 2   Years of education: Not on file   Highest education level: Not on file  Occupational History   Occupation: retired  Tobacco Use   Smoking status: Every Day    Packs/day: 0.10    Years: 45.00    Total pack years: 4.50    Types: Cigarettes    Last attempt to quit: 06/08/2019    Years since quitting: 2.8   Smokeless tobacco: Never   Tobacco comments:    4-5  cigs per day.   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 There is no history of Colon cancer. ROS:   Please see the history of present illness.    All 14 point review of systems negative except as described per history of present illness  EKGs/Labs/Other Studies Reviewed:      Recent Labs: 07/24/2021: BUN 14; Creatinine, Ser 1.07; Hemoglobin 17.4; Platelets 187; Potassium 4.5; Sodium 137  Recent Lipid Panel    Component Value Date/Time   CHOL 145 01/21/2021 1431   TRIG 172 (H) 01/21/2021 1431   HDL 37 (L) 01/21/2021 1431   CHOLHDL 3.9 01/21/2021 1431   LDLCALC 78 01/21/2021 1431    LDLDIRECT 73 10/26/2021 1338    Physical Exam:    VS:  BP (!) 90/58 (BP Location: Left Arm, Patient Position: Sitting)   Pulse 63   Ht '5\' 11"'$  (1.803 m)   Wt 149 lb 8 oz (67.8 kg)   SpO2 95%   BMI 20.85 kg/m     Wt Readings from Last 3 Encounters:  04/26/22 149 lb 8 oz (67.8 kg)  02/17/22 149 lb 3.2 oz (67.7 kg)  10/26/21 165 lb (74.8 kg)     GEN:  Well nourished, well developed in no acute distress HEENT: Normal NECK: No JVD; No carotid bruits LYMPHATICS: No lymphadenopathy CARDIAC: Hearing irregular, no murmurs, no rubs, no gallops RESPIRATORY:  Clear to auscultation without rales, wheezing or rhonchi  ABDOMEN: Soft, non-tender, non-distended MUSCULOSKELETAL:  No edema; No deformity  SKIN: Warm and dry LOWER EXTREMITIES: no swelling NEUROLOGIC:  Alert and oriented x 3 PSYCHIATRIC:  Normal affect   ASSESSMENT:    1. Coronary artery disease involving native coronary artery of native heart without angina pectoris   2. Chronic atrial fibrillation ([place]): Anticoagulated his chads 2 Vas score equals 3   3. Essential hypertension    PLAN:    In order of problems listed above:  Coronary disease stable from that point review denies have any symptoms that would suggest any reactivation of the problem. Chronic atrial fibrillation, he is anticoagulated which I will continue. Essential hypertension: Blood pressure actually is on the lower side.  I will continue present management,    Medication Adjustments/Labs and Tests Ordered: Current medicines are reviewed at length with the patient today.  Concerns regarding medicines are outlined above.  No orders of the defined types were placed in this encounter.  Medication changes: No orders of the defined types were placed in this encounter.   Signed, Park Liter, MD, [facility] 04/26/2022 2:13 PM    [place]

## 2022-04-26 NOTE — Patient Instructions (Signed)
Medication Instructions:  Your physician recommends that you continue on your current medications as directed. Please refer to the Current Medication list given to you today.  *If you need a refill on your cardiac medications before your next appointment, please call your pharmacy*   Lab Work: CMP, TSH- today If you have labs (blood work) drawn today and your tests are completely normal, you will receive your results only by: [place] (if you have MyChart) OR A paper copy in the mail If you have any lab test that is abnormal or we need to change your treatment, we will call you to review the results.   Testing/Procedures: Your physician has requested that you have an echocardiogram. Echocardiography is a painless test that uses sound waves to create images of your heart. It provides your doctor with information about the size and shape of your heart and how well your heart's chambers and valves are working. This procedure takes approximately one hour. There are no restrictions for this procedure.    Follow-Up: At [facility], you and your health needs are our priority.  As part of our continuing mission to provide you with exceptional heart care, we have created designated Provider Care Teams.  These Care Teams include your primary Cardiologist (physician) and Advanced Practice Providers (APPs -  Physician Assistants and Nurse Practitioners) who all work together to provide you with the care you need, when you need it.  We recommend signing up for the patient portal called "MyChart".  Sign up information is provided on this After Visit Summary.  MyChart is used to connect with patients for Virtual Visits (Telemedicine).  Patients are able to view lab/test results, encounter notes, upcoming appointments, etc.  Non-urgent messages can be sent to your provider as well.   To learn more about what you can do with MyChart, go to [email or web address].    Your next appointment:   6  month(s)  The format for your next appointment:   In Person  Provider:   Jenne Campus, MD    Other Instructions NA

## 2022-04-27 LAB — COMPREHENSIVE METABOLIC PANEL
ALT: 14 IU/L (ref 0–44)
AST: 17 IU/L (ref 0–40)
Albumin/Globulin Ratio: 1.6 (ref 1.2–2.2)
Albumin: 4.1 g/dL (ref 3.8–4.8)
Alkaline Phosphatase: 90 IU/L (ref 44–121)
BUN/Creatinine Ratio: 14 (ref 10–24)
BUN: 14 mg/dL (ref 8–27)
Bilirubin Total: 0.7 mg/dL (ref 0.0–1.2)
CO2: 22 mmol/L (ref 20–29)
Calcium: 9.5 mg/dL (ref 8.6–10.2)
Chloride: 99 mmol/L (ref 96–106)
Creatinine, Ser: 0.98 mg/dL (ref 0.76–1.27)
Globulin, Total: 2.5 g/dL (ref 1.5–4.5)
Glucose: 64 mg/dL — ABNORMAL LOW (ref 70–99)
Potassium: 4.6 mmol/L (ref 3.5–5.2)
Sodium: 140 mmol/L (ref 134–144)
Total Protein: 6.6 g/dL (ref 6.0–8.5)
eGFR: 82 mL/min/{1.73_m2} (ref >59)

## 2022-04-27 LAB — TSH: TSH: 2.93 u[IU]/mL (ref 0.450–4.500)

## 2022-04-28 NOTE — Progress Notes (Signed)
Unable to reach the patient, as a last attempt , I mailed a letter requesting a call back

## 2022-04-29 ENCOUNTER — Inpatient Hospital Stay: Admission: RE | Admit: 2022-04-29 | Payer: PPO | Source: Ambulatory Visit

## 2022-04-29 ENCOUNTER — Ambulatory Visit: Payer: PPO | Admitting: Pulmonary Disease

## 2022-05-05 ENCOUNTER — Ambulatory Visit: Payer: PPO | Attending: Cardiology

## 2022-05-05 DIAGNOSIS — I10 Essential (primary) hypertension: Secondary | ICD-10-CM

## 2022-05-05 DIAGNOSIS — I2510 Atherosclerotic heart disease of native coronary artery without angina pectoris: Secondary | ICD-10-CM

## 2022-05-05 DIAGNOSIS — I482 Chronic atrial fibrillation, unspecified: Secondary | ICD-10-CM

## 2022-05-05 DIAGNOSIS — R0609 Other forms of dyspnea: Secondary | ICD-10-CM | POA: Diagnosis not present

## 2022-05-05 LAB — ECHOCARDIOGRAM COMPLETE
Area-P 1/2: 5.38 cm2
MV M vel: 4.59 m/s
MV Peak grad: 84.3 mmHg
Radius: 0.4 cm
S' Lateral: 3.3 cm

## 2022-05-06 ENCOUNTER — Telehealth: Payer: Self-pay | Admitting: Cardiology

## 2022-05-06 NOTE — Telephone Encounter (Signed)
Spoke with pt about lab results. He verbalized understanding and had no questions. Routed to PCP.

## 2022-05-06 NOTE — Telephone Encounter (Signed)
Pt returning a call about lab results

## 2022-05-13 ENCOUNTER — Telehealth: Payer: Self-pay

## 2022-05-13 NOTE — Telephone Encounter (Signed)
-----   Message from Park Liter, MD sent at 05/10/2022  8:04 PM EDT ----- Echocardiogram showed normal left ventricle ejection fraction mild left ventricle hypertrophy, mild to moderate mitral valve regurgitation, medical therapy

## 2022-05-13 NOTE — Telephone Encounter (Signed)
Spoke to patient wife Johnathan Bauer, notified of results

## 2022-06-11 ENCOUNTER — Other Ambulatory Visit: Payer: Self-pay | Admitting: Cardiology

## 2022-06-11 NOTE — Telephone Encounter (Signed)
REFILL TO PHARMACY

## 2022-06-17 DIAGNOSIS — M109 Gout, unspecified: Secondary | ICD-10-CM | POA: Insufficient documentation

## 2022-06-17 DIAGNOSIS — M79671 Pain in right foot: Secondary | ICD-10-CM | POA: Insufficient documentation

## 2022-10-27 ENCOUNTER — Other Ambulatory Visit: Payer: Self-pay

## 2022-10-28 ENCOUNTER — Ambulatory Visit: Payer: PPO | Attending: Cardiology | Admitting: Cardiology

## 2022-10-28 ENCOUNTER — Encounter: Payer: Self-pay | Admitting: Cardiology

## 2022-10-28 VITALS — BP 106/74 | HR 84 | Ht 71.0 in | Wt 151.8 lb

## 2022-10-28 DIAGNOSIS — I482 Chronic atrial fibrillation, unspecified: Secondary | ICD-10-CM

## 2022-10-28 DIAGNOSIS — I10 Essential (primary) hypertension: Secondary | ICD-10-CM | POA: Diagnosis not present

## 2022-10-28 DIAGNOSIS — I671 Cerebral aneurysm, nonruptured: Secondary | ICD-10-CM | POA: Diagnosis not present

## 2022-10-28 DIAGNOSIS — I2510 Atherosclerotic heart disease of native coronary artery without angina pectoris: Secondary | ICD-10-CM

## 2022-10-28 DIAGNOSIS — I6990 Unspecified sequelae of unspecified cerebrovascular disease: Secondary | ICD-10-CM

## 2022-10-28 NOTE — Patient Instructions (Addendum)
Medication Instructions:  Your physician recommends that you continue on your current medications as directed. Please refer to the Current Medication list given to you today.  *If you need a refill on your cardiac medications before your next appointment, please call your pharmacy*   Lab Work: Your physician recommends that you return for lab work in: when fasting You need to have labs done when you are fasting.  You can come Monday through Friday 8:30 am to 12:00 pm and 1:15 to 4:30. You do not need to make an appointment as the order has already been placed. The labs you are going to have done are  Lipids.    Testing/Procedures: None Ordered   Follow-Up: At [facility], you and your health needs are our priority.  As part of our continuing mission to provide you with exceptional heart care, we have created designated Provider Care Teams.  These Care Teams include your primary Cardiologist (physician) and Advanced Practice Providers (APPs -  Physician Assistants and Nurse Practitioners) who all work together to provide you with the care you need, when you need it.  We recommend signing up for the patient portal called "MyChart".  Sign up information is provided on this After Visit Summary.  MyChart is used to connect with patients for Virtual Visits (Telemedicine).  Patients are able to view lab/test results, encounter notes, upcoming appointments, etc.  Non-urgent messages can be sent to your provider as well.   To learn more about what you can do with MyChart, go to [email or web address].    Your next appointment:   6 month(s)  The format for your next appointment:   In Person  Provider:   Jenne Campus, MD    Other Instructions NA

## 2022-10-28 NOTE — Addendum Note (Signed)
Addended by: Jacobo Forest D on: 10/28/2022 02:05 PM   Modules accepted: Orders

## 2022-10-28 NOTE — Progress Notes (Signed)
Cardiology Office Note:    Date:  10/28/2022   ID:  Johnathan Bauer, DOB Mar 17, 1951, MRN [id number]  PCP:  Enid Skeens., MD  Cardiologist:  Jenne Campus, MD    Referring MD: Enid Skeens., MD   No chief complaint on file.   History of Present Illness:    Johnathan Bauer is a 72 y.o. 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  Comes today to months for follow-up.  He comes with his wife who participated in decision making during the visit.  He is doing fair like always she complain about the fact he does do much but she was able to convince him to start doing yoga to go to [facility] to do yoga maybe once or twice a week.  And they enjoy it.  Denies having any CVA TIA-like symptoms, no palpitations no dizziness no passing out  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   stent placed   Arteriosclerotic cardiovascular disease (ASCVD) 09/03/2012   DES in 11/2011    Arthritis    "touch in my right thumb" (12/31/2014)   Blood transfusion without reported diagnosis    Blurry vision, bilateral 01/10/2019   Cerebral aneurysm, nonruptured 03/13/2019   Chronic anticoagulation 11/30/2016   Chronic atrial fibrillation (HCC)    did not like coumadin   Chronic kidney disease 09/2012   patiet denies 08/08/19   Coronary artery disease    s/p PCI Sept 2013 with DES OM   Coronary artery disease involving native coronary artery of native heart without angina pectoris 05/20/2015   Overview:  PTCA and stent to obtuse marginal 1 in October 2013  Formatting of this note might be different from the original. 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 September 2017, left-sided weakness   Melena    NSVT, 7 beat run 1/02 09/07/2012   Perforated sigmoid colon ([place]) 09/03/2012   Secondary to diverticulitis; partial resection and colostomy performed in 08/2012    Plantar fat pad atrophy of left foot 05/04/2017   Pneumonia 09/2012   Prominent metatarsal head of left foot 05/03/2017   Respiratory failure, post-operative ([place]) 09/07/2012   S/P hernia repair 12/31/2014   Stroke ([place]) 04/2016   Stroke-like symptoms 01/10/2019   Tailor's bunion of left foot 05/03/2017   UGIB (upper gastrointestinal bleed) 06/08/2019    Past Surgical History:  Procedure Laterality Date   BIOPSY  06/09/2019   Procedure: BIOPSY;  Surgeon: [name], MD;  Location: [facility] ENDOSCOPY;  Service: Endoscopy;;   COLECTOMY WITH COLOSTOMY CREATION/HARTMANN PROCEDURE  2013   COLONOSCOPY  COLOSTOMY REVISION  09/02/2012   Procedure: COLON RESECTION SIGMOID;  Surgeon: Serafina Mitchell, MD;  Location: [facility] OR;  Service: Vascular;  Laterality: N/A;  Sigmoid resection with End Colostomy.   COLOSTOMY TAKEDOWN N/A 06/06/2013   Procedure: LAPAROSCOPIC OSTOMY TAKEDOWN AND ANASTOMOSIS ;  Surgeon: Ralene Ok, MD;  Location: [place];  Service: General;  Laterality: N/A;   CORONARY ANGIOPLASTY WITH STENT PLACEMENT  06/2012   "1"   ESOPHAGOGASTRODUODENOSCOPY (EGD) WITH PROPOFOL N/A 06/09/2019   Procedure: ESOPHAGOGASTRODUODENOSCOPY (EGD) WITH PROPOFOL;   Surgeon: [name], MD;  Location: [place];  Service: Endoscopy;  Laterality: N/A;   INCISIONAL HERNIA REPAIR  12/31/2014   w/mesh   INCISIONAL HERNIA REPAIR N/A 12/31/2014   Procedure: OPEN INCISIONAL HERNIA REPAIR WITH MESH (TAR PROCEDURE);  Surgeon: Ralene Ok, MD;  Location: [place];  Service: General;  Laterality: N/A;   INSERTION OF MESH N/A 12/31/2014   Procedure: INSERTION OF MESH;  Surgeon: Ralene Ok, MD;  Location: [place];  Service: General;  Laterality: N/A;   IR 3D INDEPENDENT WKST  01/18/2019   IR ANGIO INTRA EXTRACRAN SEL INTERNAL CAROTID BILAT MOD SED  01/18/2019   IR ANGIO INTRA EXTRACRAN SEL INTERNAL CAROTID UNI L MOD SED  03/13/2019   IR ANGIO INTRA EXTRACRAN SEL INTERNAL CAROTID UNI L MOD SED  09/14/2019   IR ANGIOGRAM EXTREMITY LEFT  01/18/2019   IR ANGIOGRAM FOLLOW UP STUDY  03/13/2019   IR TRANSCATH/EMBOLIZ  03/13/2019   IR US GUIDE VASC ACCESS RIGHT  09/14/2019   LAPAROTOMY  09/02/2012   Procedure: EXPLORATORY LAPAROTOMY;  Surgeon: Serafina Mitchell, MD;  [facility];  Service: Vascular;  Laterality: N/A;  Exploratory Laparotomy with Sigmoid resection with end colocstomy.   RADIOLOGY WITH ANESTHESIA N/A 03/13/2019   Procedure: P[employer]-Medtronic;  Surgeon: Consuella Lose, MD;  Location: [place];  Service: Radiology;  Laterality: N/A;   TONSILLECTOMY      Current Medications: Current Meds  Medication Sig   albuterol (VENTOLIN HFA) 108 (90 Base) MCG/ACT inhaler Inhale 2-4 puffs into the lungs every 6 (six) hours as needed for shortness of breath or wheezing.   colchicine 0.6 MG tablet Take 0.6 mg by mouth daily.   digoxin (LANOXIN) 0.25 MG tablet Take 0.25 mg by mouth daily.   metoprolol tartrate (LOPRESSOR) 50 MG tablet Take 1 tablet (50 mg total) by mouth 2 (two) times daily.   nitroGLYCERIN (NITROSTAT) 0.4 MG SL tablet Place 0.4 mg under the tongue every 5 (five) minutes as needed for chest pain.   XARELTO 20 MG TABS tablet TAKE 1 TABLET BY MOUTH DAILY WITH  EVENING MEAL (Patient taking differently: Take 20 mg by mouth daily with supper.)     Allergies:   Beef-derived products, Poison ivy extract [poison ivy extract], and Codeine   Social History   Socioeconomic History   Marital status: Married    Spouse name: Not on file   Number of children: 2   Years of education: Not on file   Highest education level: Not on file  Occupational History   Occupation: retired  Tobacco Use   Smoking status: Every Day    Packs/day: 0.10    Years: 45.00    Total pack years: 4.50    Types: Cigarettes    Last attempt to quit: 06/08/2019    Years since quitting: 3.3   Smokeless tobacco: Never   Tobacco comments:    4-5  cigs per day.   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 There is no history of Colon cancer. ROS:   Please see the history of present illness.    All 14 point review of systems negative except as described per history of present illness  EKGs/Labs/Other Studies Reviewed:      Recent Labs: 04/26/2022: ALT 14; BUN 14; Creatinine, Ser 0.98; Potassium 4.6; Sodium 140; TSH 2.930  Recent Lipid Panel    Component Value Date/Time   CHOL 145 01/21/2021 1431   TRIG 172 (H) 01/21/2021 1431   HDL 37 (L) 01/21/2021 1431   CHOLHDL 3.9 01/21/2021 1431    LDLCALC 78 01/21/2021 1431   LDLDIRECT 73 10/26/2021 1338    Physical Exam:    VS:  BP 106/74 (BP Location: Left Arm, Patient Position: Sitting, Cuff Size: Small)   Pulse 84   Ht 5' 11"$  (1.803 m)   Wt 151 lb 12.8 oz (68.9 kg)   SpO2 94%   BMI 21.17 kg/m     Wt Readings from Last 3 Encounters:  10/28/22 151 lb 12.8 oz (68.9 kg)  04/26/22 149 lb 8 oz (67.8 kg)  02/17/22 149 lb 3.2 oz (67.7 kg)     GEN:  Well nourished, well developed in no acute distress HEENT: Normal NECK: No JVD; No carotid bruits LYMPHATICS: No lymphadenopathy CARDIAC: Irregularly irregular, no murmurs, no rubs, no gallops RESPIRATORY:  Clear to auscultation without rales, wheezing or rhonchi  ABDOMEN: Soft, non-tender, non-distended MUSCULOSKELETAL:  No edema; No deformity  SKIN: Warm and dry LOWER EXTREMITIES: no swelling NEUROLOGIC:  Alert and oriented x 3 PSYCHIATRIC:  Normal affect   ASSESSMENT:    1. Chronic atrial fibrillation ([place])   2. Coronary artery disease involving native coronary artery of native heart without angina pectoris   3. Aneurysm of anterior cerebral artery   4. Essential hypertension   5. 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 Dyslipidemia I did review his K PN which show me he is LDL of 78 HDL 37 this is from May 2022 I scheduled him to have another fasting lipid profile done.  He does have coronary artery disease and I do not see any known statin he clearly can benefit from it.  Again fasting lipid profile will be done   Medication Adjustments/Labs and Tests Ordered: Current medicines are reviewed at length with the patient today.  Concerns regarding medicines are outlined above.  Orders Placed This Encounter  Procedures   EKG 12-Lead    Medication changes: No orders of the defined types were  placed in this encounter.   Signed, Park Liter, MD, [facility] 10/28/2022 2:01 PM    [place]

## 2022-11-16 ENCOUNTER — Other Ambulatory Visit: Payer: Self-pay | Admitting: Cardiology

## 2022-11-16 NOTE — Telephone Encounter (Signed)
Pt last saw Dr Agustin Cree 10/28/22, last labs 04/26/22 Creat 0.98, age 72, weight 68.9kg, CrCl 67.38, based on CrCl pt is on appropriate dosage of Xarelto '20mg'$  QD for afib.  Will refill rx.

## 2022-12-12 ENCOUNTER — Other Ambulatory Visit: Payer: Self-pay | Admitting: Cardiology

## 2023-12-04 ENCOUNTER — Other Ambulatory Visit: Payer: Self-pay | Admitting: Cardiology

## 2023-12-05 NOTE — Telephone Encounter (Signed)
 Prescription refill request for Xarelto received.  Indication: AF Last office visit: 10/28/22  Kandyce Rud MD Weight: 68.9kg Age: 73 Scr: 0.98 on 04/26/22  CrCl: 66.40  Based on above findings Xarelto 20mg  daily is the appropriate dose.  Pt is past due for lab work.  Requested it be done at upcoming appt with Dr Bing Matter on 01/12/24.  Refill approved x 1

## 2024-01-12 ENCOUNTER — Ambulatory Visit: Attending: Cardiology | Admitting: Cardiology

## 2024-01-12 ENCOUNTER — Encounter: Payer: Self-pay | Admitting: Cardiology

## 2024-01-12 VITALS — BP 108/62 | HR 60 | Ht 72.0 in | Wt 153.6 lb

## 2024-01-12 DIAGNOSIS — I10 Essential (primary) hypertension: Secondary | ICD-10-CM

## 2024-01-12 DIAGNOSIS — I2510 Atherosclerotic heart disease of native coronary artery without angina pectoris: Secondary | ICD-10-CM

## 2024-01-12 DIAGNOSIS — Z7901 Long term (current) use of anticoagulants: Secondary | ICD-10-CM

## 2024-01-12 DIAGNOSIS — I482 Chronic atrial fibrillation, unspecified: Secondary | ICD-10-CM | POA: Diagnosis not present

## 2024-01-12 NOTE — Addendum Note (Signed)
 Addended by: Shawnee Dellen D on: 01/12/2024 11:45 AM   Modules accepted: Orders

## 2024-01-12 NOTE — Progress Notes (Signed)
 Cardiology Office Note:    Date:  01/12/2024   ID:  Johnathan Bauer, DOB 1951/05/14, MRN [id number]  PCP:  Lucius Sabins., MD  Cardiologist:  Ralene Burger, MD    Referring MD: Lucius Sabins., MD   Chief Complaint  Patient presents with   Follow-up    History of Present Illness:    Johnathan Bauer is a 73 y.o. male past medical history significant for permanent atrial fibrillation, coronary artery disease status post PTCA and stenting of the upstream marginal branch which was done in October 2013, aneurysm of the anterior cerebral artery that was requiring stenting, dyslipidemia.  Comes today to months for follow-up overall he said he is doing fine he said that he goes to gym on the regular basis and exercise on stationary bikes and also lifts weights.  He described the fact he had difficulty walking because of a problem with his feet.  He was told he does have any fat on his feet and does not hurt so much.  No cardiac symptoms no palpitations no chest pain tightness squeezing pressure burning chest  Past Medical History:  Diagnosis Date   Absolute anemia    Aftercare following surgery 07/12/2018   Alcohol  abuse 05/25/2016   Anemia    Aneurysm of anterior cerebral artery 12/2018   stent placed   Arteriosclerotic cardiovascular disease (ASCVD) 09/03/2012   DES in 11/2011    Arthritis    "touch in my right thumb" (12/31/2014)   Blood transfusion without reported diagnosis    Blurry vision, bilateral 01/10/2019   Cerebral aneurysm, nonruptured 03/13/2019   Chronic anticoagulation 11/30/2016   Chronic atrial fibrillation (HCC)    did not like coumadin   Chronic kidney disease 09/2012   patiet denies 08/08/19   Coronary artery disease    s/p PCI Sept 2013 with DES OM   Coronary artery disease involving native coronary artery of native heart without angina pectoris 05/20/2015   Overview:  PTCA and stent to obtuse marginal 1 in October 2013  Formatting of this note might be different  from the original. 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 September 2017, left-sided weakness   Melena    NSVT, 7 beat run 1/02 09/07/2012   Perforated sigmoid colon (HCC) 09/03/2012   Secondary to diverticulitis; partial resection and colostomy performed in 08/2012    Plantar fat pad atrophy of left foot 05/04/2017   Pneumonia 09/2012   Prominent metatarsal head of left foot 05/03/2017   Respiratory failure, post-operative (HCC) 09/07/2012   S/P hernia repair 12/31/2014   Stroke (HCC) 04/2016   Stroke-like symptoms 01/10/2019   Tailor's bunion of left foot 05/03/2017   UGIB (upper gastrointestinal bleed) 06/08/2019    Past Surgical History:  Procedure Laterality Date   BIOPSY  06/09/2019   Procedure: BIOPSY;  Surgeon: Kenney Peacemaker, MD;  [facility] ENDOSCOPY;  Service: Endoscopy;;   COLECTOMY WITH COLOSTOMY CREATION/HARTMANN  PROCEDURE  2013   COLONOSCOPY     COLOSTOMY REVISION  09/02/2012   Procedure: COLON RESECTION SIGMOID;  Surgeon: Margherita Shell, MD;  Location: [facility] OR;  Service: Vascular;  Laterality: N/A;  Sigmoid resection with End Colostomy.   COLOSTOMY TAKEDOWN N/A 06/06/2013   Procedure: LAPAROSCOPIC OSTOMY TAKEDOWN AND ANASTOMOSIS ;  Surgeon: Shela Derby, MD;  Location: [facility] OR;  Service: General;  Laterality: N/A;   CORONARY ANGIOPLASTY WITH STENT PLACEMENT  06/2012   "1"   ESOPHAGOGASTRODUODENOSCOPY (EGD) WITH PROPOFOL  N/A 06/09/2019    Procedure: ESOPHAGOGASTRODUODENOSCOPY (EGD) WITH PROPOFOL ;  Surgeon: Kenney Peacemaker, MD;  Location: [facility] ENDOSCOPY;  Service: Endoscopy;  Laterality: N/A;   INCISIONAL HERNIA REPAIR  12/31/2014   w/mesh   INCISIONAL HERNIA REPAIR N/A 12/31/2014   Procedure: OPEN INCISIONAL HERNIA REPAIR WITH MESH (TAR PROCEDURE);  Surgeon: Shela Derby, MD;  Location: [facility] OR;  Service: General;  Laterality: N/A;   INSERTION OF MESH N/A 12/31/2014   Procedure: INSERTION OF MESH;  Surgeon: Shela Derby, MD;  Location: [facility] OR;  Service: General;  Laterality: N/A;   IR 3D INDEPENDENT WKST  01/18/2019   IR ANGIO INTRA EXTRACRAN SEL INTERNAL CAROTID BILAT MOD SED  01/18/2019   IR ANGIO INTRA EXTRACRAN SEL INTERNAL CAROTID UNI L MOD SED  03/13/2019   IR ANGIO INTRA EXTRACRAN SEL INTERNAL CAROTID UNI L MOD SED  09/14/2019   IR ANGIOGRAM EXTREMITY LEFT  01/18/2019   IR ANGIOGRAM FOLLOW UP STUDY  03/13/2019   IR TRANSCATH/EMBOLIZ  03/13/2019   IR US  GUIDE VASC ACCESS RIGHT  09/14/2019   LAPAROTOMY  09/02/2012   Procedure: EXPLORATORY LAPAROTOMY;  Surgeon: Margherita Shell, MD;  [facility] OR;  Service: Vascular;  Laterality: N/A;  Exploratory Laparotomy with Sigmoid resection with end colocstomy.   RADIOLOGY WITH ANESTHESIA N/A 03/13/2019   Procedure: [employer]-Medtronic;  Surgeon: Augusto Blonder, MD;  Location: [facility] OR;  Service: Radiology;  Laterality: N/A;   TONSILLECTOMY      Current Medications: Current Meds  Medication Sig   albuterol  (VENTOLIN  HFA) 108 (90 Base) MCG/ACT inhaler Inhale 2-4 puffs into the lungs every 6 (six) hours as needed for shortness of breath or wheezing.   colchicine 0.6 MG tablet Take 0.6 mg by mouth daily.   digoxin  (LANOXIN ) 0.25 MG tablet Take 0.25 mg by mouth daily.   metoprolol  tartrate (LOPRESSOR ) 50 MG tablet Take 1 tablet (50 mg total) by mouth 2 (two) times daily.   nitroGLYCERIN  (NITROSTAT ) 0.4 MG SL tablet Place 0.4 mg under the tongue every 5 (five) minutes as needed for chest pain.    rivaroxaban  (XARELTO ) 20 MG TABS tablet TAKE 1 TABLET BY MOUTH DAILY WITH EVENING MEAL (Patient taking differently: Take 20 mg by mouth daily with supper.)     Allergies:   Beef-derived drug products, Poison ivy extract [poison ivy extract], and Codeine   Social History   Socioeconomic History   Marital status: Married    Spouse name: Not on file   Number of children: 2   Years of education: Not on file   Highest education level: Not on file  Occupational History   Occupation: retired  Tobacco Use   Smoking status: Every Day    Current packs/day: 0.00    Average packs/day: 0.1 packs/day for 45.0 years (4.5 ttl pk-yrs)    Types: Cigarettes    Start date: 06/07/1974    Last attempt to quit: 06/08/2019    Years since quitting: 4.6   Smokeless tobacco: Never  Tobacco comments:    4-5  cigs per day.   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 There is no history of Colon cancer. ROS:   Please see the history of present illness.    All 14 point review of systems negative except as described per history of present illness  EKGs/Labs/Other Studies Reviewed:    EKG Interpretation Date/Time:  Thursday Jan 12 2024 11:18:54 EDT Ventricular Rate:  80 PR Interval:    QRS Duration:  88 QT  Interval:  376 QTC Calculation: 433 R Axis:   -78  Text Interpretation: Atrial fibrillation Left axis deviation Septal infarct , age undetermined Abnormal ECG When compared with ECG of 08-Jun-2019 23:25, Previous ECG has undetermined rhythm, needs review QRS axis Shifted left Septal infarct is now Present Confirmed by Ralene Burger [phone]) on 01/12/2024 11:31:42 AM    Recent Labs: No results found for requested labs within last 365 days.  Recent Lipid Panel    Component Value Date/Time   CHOL 145 01/21/2021 1431   TRIG 172 (H) 01/21/2021 1431   HDL 37 (L) 01/21/2021 1431   CHOLHDL 3.9 01/21/2021 1431   LDLCALC 78 01/21/2021 1431   LDLDIRECT 73 10/26/2021 1338    Physical Exam:    VS:  BP 108/62 (BP Location: Left Arm, Patient Position: Sitting)   Pulse 60   Ht 6' (1.829 m)   Wt 153 lb 9.6 oz (69.7 kg)   SpO2 96%   BMI 20.83 kg/m     Wt Readings from Last 3 Encounters:  01/12/24 153 lb 9.6 oz (69.7 kg)  10/28/22 151 lb 12.8 oz (68.9 kg)  04/26/22 149 lb 8 oz (67.8 kg)     GEN:  Well nourished, well developed in no acute distress HEENT: Normal NECK: No JVD; No carotid bruits LYMPHATICS: No lymphadenopathy CARDIAC: Irregularly irregular, no murmurs, no rubs, no gallops RESPIRATORY:  Clear to auscultation without rales, wheezing or rhonchi  ABDOMEN: Soft, non-tender, non-distended MUSCULOSKELETAL:  No edema; No deformity  SKIN: Warm and dry LOWER EXTREMITIES: no swelling NEUROLOGIC:  Alert and oriented x 3 PSYCHIATRIC:  Normal affect   ASSESSMENT:    1. Chronic atrial fibrillation (HCC)   2. Essential hypertension   3. Coronary artery disease involving native coronary artery of native heart without angina pectoris   4. Chronic anticoagulation    PLAN:    In order of problems listed above:  Permanent atrial fibrillation, rate controlled, anticoagulated Xarelto  I will continue.  I will check Chem-7 make sure the dose is appropriate to his kidney  function. Essential hypertension blood pressure well-controlled continue monitoring. Coronary artery disease stable from that point review without any signs and symptoms of reactivation of the problem. Chronic  anticoagulation continue. Dyslipidemia will check his fasting lipid profile last numbers I have is from 2022 with LDL 78 HDL 37   Medication Adjustments/Labs and Tests Ordered: Current medicines are reviewed at length with the patient today.  Concerns regarding medicines are outlined above.  Orders Placed This Encounter  Procedures   EKG 12-Lead   Medication changes: No orders of the defined types were placed in this encounter.   Signed, Manfred Seed, MD, [facility] 01/12/2024 11:39 AM    [facility]

## 2024-01-12 NOTE — Patient Instructions (Addendum)
 Medication Instructions:  Your physician recommends that you continue on your current medications as directed. Please refer to the Current Medication list given to you today.  *If you need a refill on your cardiac medications before your next appointment, please call your pharmacy*   Lab Work: Your physician recommends that you return for lab work in: when fasting You need to have labs done when you are fasting.  You can come Monday through Friday 8:30 am to 12:00 pm and 1:15 to 4:30. You do not need to make an appointment as the order has already been placed. The labs you are going to have done are Lipid panel, CMP.    Testing/Procedures: None Ordered   Follow-Up: At [facility], you and your health needs are our priority.  As part of our continuing mission to provide you with exceptional heart care, we have created designated Provider Care Teams.  These Care Teams include your primary Cardiologist (physician) and Advanced Practice Providers (APPs -  Physician Assistants and Nurse Practitioners) who all work together to provide you with the care you need, when you need it.  We recommend signing up for the patient portal called "MyChart".  Sign up information is provided on this After Visit Summary.  MyChart is used to connect with patients for Virtual Visits (Telemedicine).  Patients are able to view lab/test results, encounter notes, upcoming appointments, etc.  Non-urgent messages can be sent to your provider as well.   To learn more about what you can do with MyChart, go to [email or web address].    Your next appointment:   12 month(s)  The format for your next appointment:   In Person  Provider:   Ralene Burger, MD    Other Instructions NA

## 2024-09-04 ENCOUNTER — Telehealth: Payer: Self-pay | Admitting: Cardiology

## 2024-09-04 MED ORDER — DIGOXIN 250 MCG PO TABS: 0.2500 mg | ORAL_TABLET | Freq: Every day | ORAL | 1 refills | Status: AC

## 2024-09-04 NOTE — Telephone Encounter (Signed)
" °*  STAT* If patient is at the pharmacy, call can be transferred to refill team.   1. Which medications need to be refilled? (please list name of each medication and dose if known) digoxin  (LANOXIN ) 0.25 MG tablet       4. Which pharmacy/location (including street and city if local pharmacy) is medication to be sent to? [id number] - [place], [place] - [address] Phone: [phone]  Fax: [phone]       5. Do they need a 30 day or 90 day supply? 90  "

## 2024-09-04 NOTE — Telephone Encounter (Signed)
 Pt's medication was sent to pt's pharmacy as requested. Confirmation received.

## 2024-09-12 ENCOUNTER — Ambulatory Visit: Attending: Cardiology | Admitting: Cardiology

## 2024-09-12 ENCOUNTER — Encounter: Payer: Self-pay | Admitting: Cardiology

## 2024-09-12 VITALS — BP 118/78 | HR 79 | Ht 74.0 in | Wt 155.8 lb

## 2024-09-12 DIAGNOSIS — I6930 Unspecified sequelae of cerebral infarction: Secondary | ICD-10-CM

## 2024-09-12 DIAGNOSIS — I482 Chronic atrial fibrillation, unspecified: Secondary | ICD-10-CM | POA: Diagnosis not present

## 2024-09-12 DIAGNOSIS — I10 Essential (primary) hypertension: Secondary | ICD-10-CM | POA: Diagnosis not present

## 2024-09-12 DIAGNOSIS — I2510 Atherosclerotic heart disease of native coronary artery without angina pectoris: Secondary | ICD-10-CM | POA: Diagnosis not present

## 2024-09-12 NOTE — Patient Instructions (Signed)

## 2024-09-12 NOTE — Progress Notes (Signed)
 " Cardiology Office Note:    Date:  09/12/2024   ID:  Johnathan Bauer, DOB [birth year], MRN [id number]  PCP:  Sabas Norleen PARAS., MD  Cardiologist:  Lamar Fitch, MD    Referring MD: Sabas Norleen PARAS., MD   No chief complaint on file. Doing fine  History of Present Illness:    Johnathan Bauer is a 74 y.o. 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  Comes today to my office since I seen him last time he ended up sustaining a fall and strike his head he did have subdural hematoma was transferred to [facility], likely no intervention has been required.  Interestingly he mention today that he started slacking off with Xarelto  and he actually did not take his Xarelto  on the day of his injury.  Again no intervention required he is back in my office.  He need to be off anticoagulation for 1 month which will be until the end of January but because of his history of falls I think we need to consider Watchman device.  Denies have any cardiac complaints, no chest pain tightness squeezing pressure burning chest  Past Medical History:  Diagnosis Date   Absolute anemia    Aftercare following surgery 07/12/2018   Alcohol  abuse 05/25/2016   Anemia    Aneurysm of anterior cerebral artery 12/2018   stent placed   Arteriosclerotic cardiovascular disease (ASCVD) 09/03/2012   DES in 11/2011    Arthritis    touch in my right thumb (12/31/2014)   Blood transfusion without reported diagnosis    Blurry vision, bilateral 01/10/2019   Cerebral aneurysm, nonruptured 03/13/2019   Chronic anticoagulation 11/30/2016   Chronic atrial fibrillation (HCC)    did not like coumadin   Chronic kidney disease 09/2012   patiet denies 08/08/19   Coronary artery disease    s/p PCI Sept 2013 with DES OM   Coronary  artery disease involving native coronary artery of native heart without angina pectoris 05/20/2015   Overview:  PTCA and stent to obtuse marginal 1 in October 2013  Formatting of this note might be different from the original. 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 September 2017, left-sided weakness   Melena    NSVT, 7 beat run 1/02 09/07/2012   Perforated sigmoid colon (HCC) 09/03/2012   Secondary to diverticulitis; partial resection and colostomy performed in 08/2012    Plantar fat pad atrophy of left foot 05/04/2017   Pneumonia 09/2012   Prominent metatarsal head of left foot 05/03/2017   Respiratory failure, post-operative 09/07/2012   S/P hernia repair 12/31/2014   Stroke (HCC) 04/2016   Stroke-like symptoms 01/10/2019   Tailor's bunion of left foot 05/03/2017   UGIB (upper gastrointestinal bleed) 06/08/2019    Past Surgical  History:  Procedure Laterality Date   BIOPSY  06/09/2019   Procedure: BIOPSY;  Surgeon: Avram Lupita BRAVO, MD;  Location: [facility] ENDOSCOPY;  Service: Endoscopy;;   COLECTOMY WITH COLOSTOMY CREATION/HARTMANN PROCEDURE  2013   COLONOSCOPY     COLOSTOMY REVISION  09/02/2012   Procedure: COLON RESECTION SIGMOID;  Surgeon: Gaile LELON New, MD;  Location: [facility] OR;  Service: Vascular;  Laterality: N/A;  Sigmoid resection with End Colostomy.   COLOSTOMY TAKEDOWN N/A 06/06/2013   Procedure: LAPAROSCOPIC OSTOMY TAKEDOWN AND ANASTOMOSIS ;  Surgeon: Lynda Leos, MD;  Location: [facility] OR;  Service: General;  Laterality: N/A;   CORONARY ANGIOPLASTY WITH STENT PLACEMENT  06/2012   1   ESOPHAGOGASTRODUODENOSCOPY (EGD) WITH PROPOFOL  N/A 06/09/2019   Procedure: ESOPHAGOGASTRODUODENOSCOPY (EGD) WITH PROPOFOL ;  Surgeon: Avram Lupita BRAVO, MD;  Location: [facility] ENDOSCOPY;  Service: Endoscopy;  Laterality: N/A;   INCISIONAL HERNIA REPAIR  12/31/2014   w/mesh   INCISIONAL HERNIA REPAIR N/A 12/31/2014   Procedure: OPEN INCISIONAL HERNIA REPAIR WITH MESH (TAR PROCEDURE);  Surgeon: Lynda Leos, MD;  Location: [facility] OR;  Service: General;  Laterality: N/A;   INSERTION OF MESH N/A 12/31/2014   Procedure: INSERTION OF MESH;  Surgeon: Lynda Leos, MD;  Location: [facility] OR;  Service: General;  Laterality: N/A;   IR 3D INDEPENDENT WKST  01/18/2019   IR ANGIO INTRA EXTRACRAN SEL INTERNAL CAROTID BILAT MOD SED  01/18/2019   IR ANGIO INTRA EXTRACRAN SEL INTERNAL CAROTID UNI L MOD SED  03/13/2019   IR ANGIO INTRA EXTRACRAN SEL INTERNAL CAROTID UNI L MOD SED  09/14/2019   IR ANGIOGRAM EXTREMITY LEFT  01/18/2019   IR ANGIOGRAM FOLLOW UP STUDY  03/13/2019   IR TRANSCATH/EMBOLIZ  03/13/2019   IR US  GUIDE VASC ACCESS RIGHT  09/14/2019   LAPAROTOMY  09/02/2012   Procedure: EXPLORATORY LAPAROTOMY;  Surgeon: Gaile LELON New, MD;  Location: [facility] OR;  Service: Vascular;  Laterality: N/A;  Exploratory Laparotomy with Sigmoid resection with end colocstomy.   RADIOLOGY WITH ANESTHESIA N/A 03/13/2019   Procedure: [employer];  Surgeon: Lanis Pupa, MD;  Location: [facility] OR;  Service: Radiology;  Laterality: N/A;   TONSILLECTOMY      Current Medications: Active Medications[1]   Allergies:   Bovine (beef) protein-containing drug products, Poison ivy extract [poison ivy extract], and Codeine   Social History   Socioeconomic History   Marital status: Married    Spouse name: Not on file   Number of children: 2   Years of education: Not on file   Highest education level: Not on file   Occupational History   Occupation: retired  Tobacco Use   Smoking status: Every Day    Current packs/day: 0.00    Average packs/day: 0.1 packs/day for 45.0 years (4.5 ttl pk-yrs)    Types: Cigarettes    Start date: 06/07/1974    Last attempt to quit: 06/08/2019    Years since quitting: 5.2   Smokeless tobacco: Never   Tobacco comments:    4-5  cigs per day.   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   Someday smoker, no EtOH, drugs   Social  Drivers of Health   Tobacco Use: High Risk (09/12/2024)   Patient History    Smoking Tobacco Use: Every Day    Smokeless Tobacco Use: Never    Passive Exposure: Not on file  Financial Resource Strain: Not on file  Food Insecurity: Not on file  Transportation Needs: Not on file  Physical Activity: Not on file  Stress: Not on file  Social Connections: Not on file  Depression (EYV7-0): Not on file  Alcohol  Screen: Not on file  Housing: Not on file  Utilities: Not on file  Health Literacy: Not on file     Family History: The patient's family history includes Lymphoma in his father; Prostate cancer in his paternal grandfather. There is no history of Colon cancer. ROS:   Please see the history of present illness.    All 14 point review of systems negative except as described per history of present illness  EKGs/Labs/Other Studies Reviewed:         Recent Labs: No results found for requested labs within last 365 days.  Recent Lipid Panel    Component Value Date/Time   CHOL 145 01/21/2021 1431   TRIG 172 (H) 01/21/2021 1431   HDL 37 (L) 01/21/2021 1431   CHOLHDL 3.9 01/21/2021 1431   LDLCALC 78 01/21/2021 1431   LDLDIRECT 73  10/26/2021 1338    Physical Exam:    VS:  BP 118/78   Pulse 79   Ht 6' 2 (1.88 m)   Wt 155 lb 12.8 oz (70.7 kg)   SpO2 96%   BMI 20.00 kg/m     Wt Readings from Last 3 Encounters:  09/12/24 155 lb 12.8 oz (70.7 kg)  01/12/24 153 lb 9.6 oz (69.7 kg)  10/28/22 151 lb 12.8 oz (68.9 kg)     GEN:  Well nourished, well developed in no acute distress HEENT: Normal NECK: No JVD; No carotid bruits LYMPHATICS: No lymphadenopathy CARDIAC: Irregularly irregular no murmurs, no rubs, no gallops RESPIRATORY:  Clear to auscultation without rales, wheezing or rhonchi  ABDOMEN: Soft, non-tender, non-distended MUSCULOSKELETAL:  No edema; No deformity  SKIN: Warm and dry LOWER EXTREMITIES: no swelling NEUROLOGIC:  Alert and oriented x 3 PSYCHIATRIC:  Normal affect   ASSESSMENT:    1. Coronary artery disease involving native coronary artery of native heart without angina pectoris   2. Essential hypertension   3. 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 Chronic atrial fibrillation (HCC): Anticoagulated his chads 2 Vas score equals 3    PLAN:    In order of problems listed above:  Coronary disease stable from that point review continue present management, Essential hypertension blood pressure well-controlled.  He actually had a primary low blood pressure while in the hospital required some fluid challenges and holding of his medications now looks good. History of subdural hematoma.  He will be a excellent candidate for Watchman device I suspect drinking still continue I suspect he will have some falls in the future protecting from having more trauma will be to get a Watchman device and this way we can withdraw anticoagulation.  He is out of Xarelto  until the end of this month and Xarelto  will be restarted. Permanent atrial fibrillation, rate controlled continue present management Record from hospital reviewed   Medication  Adjustments/Labs and Tests Ordered: Current medicines are reviewed at length with the patient today.  Concerns regarding medicines are outlined above.  No orders of the defined types were placed in  this encounter.  Medication changes: No orders of the defined types were placed in this encounter.   Signed, Lamar DOROTHA Fitch, MD, [facility] 09/12/2024 2:11 PM    [facility]     [1]  Current Meds  Medication Sig   albuterol  (VENTOLIN  HFA) 108 (90 Base) MCG/ACT inhaler Inhale 2-4 puffs into the lungs every 6 (six) hours as needed for shortness of breath or wheezing.   digoxin  (LANOXIN ) 0.25 MG tablet Take 1 tablet (0.25 mg total) by mouth daily.   metoprolol  tartrate (LOPRESSOR ) 50 MG tablet Take 1 tablet (50 mg total) by mouth 2 (two) times daily.   nitroGLYCERIN  (NITROSTAT ) 0.4 MG SL tablet Place 0.4 mg under the tongue every 5 (five) minutes as needed for chest pain.   "

## 2024-09-27 ENCOUNTER — Telehealth: Payer: Self-pay

## 2024-09-27 NOTE — Telephone Encounter (Signed)
 Recevied Watchman referral from Dr. Krasowski.   Spoke with patient to arrange consult with Dr. Kennyth. Patient needs to speak with wife prior to scheduling. He will call back to confirm appt.

## 2024-09-28 NOTE — Telephone Encounter (Signed)
 Spoke with wife. Confirmed consult with Dr. Kennyth 3/14 at 3:30 PM. She was grateful for the call.

## 2024-10-02 ENCOUNTER — Telehealth: Payer: Self-pay | Admitting: Cardiology

## 2024-10-02 NOTE — Telephone Encounter (Signed)
 Pt c/o medication issue:  1. Name of Medication:   rivaroxaban  (XARELTO ) 20 MG TABS tablet    2. How are you currently taking this medication (dosage and times per day)? Holding   3. Are you having a reaction (difficulty breathing--STAT)? No   4. What is your medication issue? Pt asking when he should resume Xarelto . Please advise

## 2024-10-02 NOTE — Telephone Encounter (Signed)
 Discussed with patient the note from [facility] on the results of the CT. They stated that the Subdural Hematoma was resolved and he could restart the Xarelto . They recommended a CT in 2-3 weeks after restarting Xarelto .

## 2024-11-20 ENCOUNTER — Ambulatory Visit: Admitting: Cardiology
